# Patient Record
Sex: Male | Born: 1950
Health system: Southern US, Community
[De-identification: ages and names within clinical notes are randomized; demographics above are authoritative.]

## PROBLEM LIST (undated history)

## (undated) DIAGNOSIS — I499 Cardiac arrhythmia, unspecified: Secondary | ICD-10-CM

## (undated) DIAGNOSIS — K429 Umbilical hernia without obstruction or gangrene: Secondary | ICD-10-CM

## (undated) DIAGNOSIS — K219 Gastro-esophageal reflux disease without esophagitis: Secondary | ICD-10-CM

## (undated) DIAGNOSIS — J449 Chronic obstructive pulmonary disease, unspecified: Secondary | ICD-10-CM

## (undated) DIAGNOSIS — I10 Essential (primary) hypertension: Secondary | ICD-10-CM

## (undated) DIAGNOSIS — R42 Dizziness and giddiness: Secondary | ICD-10-CM

## (undated) DIAGNOSIS — M1990 Unspecified osteoarthritis, unspecified site: Secondary | ICD-10-CM

## (undated) DIAGNOSIS — T8859XA Other complications of anesthesia, initial encounter: Secondary | ICD-10-CM

## (undated) DIAGNOSIS — E7800 Pure hypercholesterolemia, unspecified: Secondary | ICD-10-CM

## (undated) HISTORY — PX: WISDOM TOOTH EXTRACTION: SHX21

## (undated) HISTORY — PX: SHOULDER SURGERY: SHX246

## (undated) HISTORY — DX: Pure hypercholesterolemia, unspecified: E78.00

## (undated) HISTORY — PX: TONSILLECTOMY AND ADENOIDECTOMY: SUR1326

## (undated) HISTORY — DX: Unspecified osteoarthritis, unspecified site: M19.90

---

## 1980-12-23 HISTORY — PX: HEMORRHOID SURGERY: SHX153

## 2003-12-24 DIAGNOSIS — G709 Myoneural disorder, unspecified: Secondary | ICD-10-CM

## 2003-12-24 HISTORY — DX: Myoneural disorder, unspecified: G70.9

## 2004-01-30 ENCOUNTER — Encounter: Payer: Self-pay | Admitting: Internal Medicine

## 2005-01-01 ENCOUNTER — Ambulatory Visit: Payer: Self-pay | Admitting: Internal Medicine

## 2005-01-10 ENCOUNTER — Ambulatory Visit: Payer: Self-pay | Admitting: Internal Medicine

## 2005-02-05 ENCOUNTER — Ambulatory Visit: Payer: Self-pay | Admitting: Internal Medicine

## 2005-05-06 ENCOUNTER — Ambulatory Visit: Payer: Self-pay | Admitting: Internal Medicine

## 2005-06-19 ENCOUNTER — Ambulatory Visit: Payer: Self-pay | Admitting: Internal Medicine

## 2005-06-26 ENCOUNTER — Encounter: Admission: RE | Admit: 2005-06-26 | Discharge: 2005-06-26 | Payer: Self-pay | Admitting: Orthopedic Surgery

## 2005-07-11 ENCOUNTER — Ambulatory Visit: Payer: Self-pay | Admitting: Internal Medicine

## 2005-08-06 ENCOUNTER — Ambulatory Visit: Payer: Self-pay | Admitting: Internal Medicine

## 2005-08-08 ENCOUNTER — Ambulatory Visit: Payer: Self-pay

## 2005-09-09 ENCOUNTER — Ambulatory Visit: Payer: Self-pay | Admitting: Cardiology

## 2005-09-10 ENCOUNTER — Ambulatory Visit: Payer: Self-pay

## 2005-09-10 ENCOUNTER — Encounter: Payer: Self-pay | Admitting: Internal Medicine

## 2005-10-03 ENCOUNTER — Inpatient Hospital Stay (HOSPITAL_COMMUNITY): Admission: RE | Admit: 2005-10-03 | Discharge: 2005-10-07 | Payer: Self-pay | Admitting: Orthopedic Surgery

## 2005-12-27 ENCOUNTER — Ambulatory Visit: Payer: Self-pay | Admitting: Internal Medicine

## 2006-01-03 ENCOUNTER — Ambulatory Visit: Payer: Self-pay | Admitting: Internal Medicine

## 2006-01-09 ENCOUNTER — Ambulatory Visit: Payer: Self-pay | Admitting: Gastroenterology

## 2006-01-29 ENCOUNTER — Ambulatory Visit: Payer: Self-pay | Admitting: Gastroenterology

## 2006-01-29 ENCOUNTER — Encounter (INDEPENDENT_AMBULATORY_CARE_PROVIDER_SITE_OTHER): Payer: Self-pay | Admitting: Specialist

## 2006-01-29 ENCOUNTER — Encounter: Payer: Self-pay | Admitting: Internal Medicine

## 2006-07-03 ENCOUNTER — Ambulatory Visit: Payer: Self-pay | Admitting: Internal Medicine

## 2006-07-10 ENCOUNTER — Ambulatory Visit: Payer: Self-pay | Admitting: Internal Medicine

## 2007-01-01 ENCOUNTER — Ambulatory Visit: Payer: Self-pay | Admitting: Internal Medicine

## 2007-01-01 LAB — CONVERTED CEMR LAB
ALT: 51 units/L — ABNORMAL HIGH (ref 0–40)
AST: 45 units/L — ABNORMAL HIGH (ref 0–37)
Albumin: 4 g/dL (ref 3.5–5.2)
Alkaline Phosphatase: 62 units/L (ref 39–117)
BUN: 14 mg/dL (ref 6–23)
Basophils Absolute: 0 10*3/uL (ref 0.0–0.1)
Basophils Relative: 0.1 % (ref 0.0–1.0)
CO2: 29 meq/L (ref 19–32)
Calcium: 9.8 mg/dL (ref 8.4–10.5)
Chloride: 105 meq/L (ref 96–112)
Chol/HDL Ratio, serum: 4.1
Cholesterol: 189 mg/dL (ref 0–200)
Creatinine, Ser: 1.3 mg/dL (ref 0.4–1.5)
Eosinophil percent: 4.9 % (ref 0.0–5.0)
GFR calc non Af Amer: 61 mL/min
Glomerular Filtration Rate, Af Am: 73 mL/min/{1.73_m2}
Glucose, Bld: 88 mg/dL (ref 70–99)
HCT: 44.4 % (ref 39.0–52.0)
HDL: 45.9 mg/dL (ref >39.0)
Hemoglobin: 15 g/dL (ref 13.0–17.0)
LDL DIRECT: 101.8 mg/dL
Lymphocytes Relative: 41.3 % (ref 12.0–46.0)
MCHC: 33.7 g/dL (ref 30.0–36.0)
MCV: 93.6 fL (ref 78.0–100.0)
Monocytes Absolute: 0.6 10*3/uL (ref 0.2–0.7)
Monocytes Relative: 10.8 % (ref 3.0–11.0)
Neutro Abs: 2.2 10*3/uL (ref 1.4–7.7)
Neutrophils Relative %: 42.9 % — ABNORMAL LOW (ref 43.0–77.0)
PSA: 2.04 ng/mL (ref 0.10–4.00)
Platelets: 259 10*3/uL (ref 150–400)
Potassium: 4.8 meq/L (ref 3.5–5.1)
RBC: 4.74 M/uL (ref 4.22–5.81)
RDW: 12.4 % (ref 11.5–14.6)
Sodium: 142 meq/L (ref 135–145)
TSH: 1.72 microintl units/mL (ref 0.35–5.50)
Testosterone, total: 2.5896 ng/mL — ABNORMAL LOW
Total Bilirubin: 0.7 mg/dL (ref 0.3–1.2)
Total Protein: 6.9 g/dL (ref 6.0–8.3)
Triglyceride fasting, serum: 244 mg/dL (ref 0–149)
VLDL: 49 mg/dL — ABNORMAL HIGH (ref 0–40)
WBC: 5.3 10*3/uL (ref 4.5–10.5)

## 2007-01-08 ENCOUNTER — Ambulatory Visit: Payer: Self-pay | Admitting: Internal Medicine

## 2007-02-13 ENCOUNTER — Ambulatory Visit: Payer: Self-pay | Admitting: Internal Medicine

## 2007-02-13 LAB — CONVERTED CEMR LAB
ALT: 38 units/L (ref 0–40)
AST: 34 units/L (ref 0–37)
Albumin: 4.1 g/dL (ref 3.5–5.2)
Alkaline Phosphatase: 58 units/L (ref 39–117)
Bilirubin, Direct: 0.1 mg/dL (ref 0.0–0.3)
Cholesterol: 199 mg/dL (ref 0–200)
HDL: 38.9 mg/dL — ABNORMAL LOW (ref >39.0)
LDL Cholesterol: 122 mg/dL — ABNORMAL HIGH (ref 0–99)
Testosterone: 594.73 ng/dL (ref 350.00–890)
Total Bilirubin: 1 mg/dL (ref 0.3–1.2)
Total CHOL/HDL Ratio: 5.1
Total Protein: 6.5 g/dL (ref 6.0–8.3)
Triglycerides: 193 mg/dL — ABNORMAL HIGH (ref 0–149)
VLDL: 39 mg/dL (ref 0–40)

## 2007-03-24 ENCOUNTER — Ambulatory Visit: Payer: Self-pay | Admitting: Internal Medicine

## 2007-03-24 LAB — CONVERTED CEMR LAB: H Pylori IgG: NEGATIVE

## 2007-05-07 ENCOUNTER — Ambulatory Visit: Payer: Self-pay | Admitting: Internal Medicine

## 2007-05-07 LAB — CONVERTED CEMR LAB
ALT: 68 units/L — ABNORMAL HIGH (ref 0–40)
AST: 51 units/L — ABNORMAL HIGH (ref 0–37)
Albumin: 4.2 g/dL (ref 3.5–5.2)
Alkaline Phosphatase: 57 units/L (ref 39–117)
Bilirubin, Direct: 0.1 mg/dL (ref 0.0–0.3)
Sex Hormone Binding: 27 nmol/L (ref 13–71)
Testosterone Free: 72.3 pg/mL (ref 47.0–244.0)
Testosterone-% Free: 2.2 % (ref 1.6–2.9)
Testosterone: 325.57 ng/dL — ABNORMAL LOW (ref 350–890)
Total Bilirubin: 0.6 mg/dL (ref 0.3–1.2)
Total Protein: 6.6 g/dL (ref 6.0–8.3)
Triglycerides: 136 mg/dL (ref 0–149)

## 2007-05-26 ENCOUNTER — Ambulatory Visit: Payer: Self-pay | Admitting: Internal Medicine

## 2007-07-28 ENCOUNTER — Telehealth: Payer: Self-pay | Admitting: *Deleted

## 2007-08-03 DIAGNOSIS — E785 Hyperlipidemia, unspecified: Secondary | ICD-10-CM

## 2007-08-11 ENCOUNTER — Telehealth: Payer: Self-pay | Admitting: Internal Medicine

## 2007-08-11 ENCOUNTER — Ambulatory Visit: Payer: Self-pay | Admitting: Internal Medicine

## 2007-08-11 LAB — CONVERTED CEMR LAB
Cholesterol: 198 mg/dL (ref 0–200)
Direct LDL: 119.7 mg/dL
HDL: 36.8 mg/dL — ABNORMAL LOW (ref >39.0)
Total CHOL/HDL Ratio: 5.4
Triglycerides: 218 mg/dL (ref 0–149)
VLDL: 44 mg/dL — ABNORMAL HIGH (ref 0–40)

## 2007-08-25 ENCOUNTER — Ambulatory Visit: Payer: Self-pay | Admitting: Internal Medicine

## 2007-08-25 DIAGNOSIS — M1990 Unspecified osteoarthritis, unspecified site: Secondary | ICD-10-CM | POA: Insufficient documentation

## 2007-08-25 DIAGNOSIS — R002 Palpitations: Secondary | ICD-10-CM | POA: Insufficient documentation

## 2007-11-23 ENCOUNTER — Telehealth: Payer: Self-pay | Admitting: Internal Medicine

## 2007-12-15 ENCOUNTER — Telehealth: Payer: Self-pay | Admitting: Internal Medicine

## 2008-02-02 ENCOUNTER — Ambulatory Visit: Payer: Self-pay | Admitting: Internal Medicine

## 2008-02-02 LAB — CONVERTED CEMR LAB
ALT: 42 units/L (ref 0–53)
AST: 29 units/L (ref 0–37)
Albumin: 3.7 g/dL (ref 3.5–5.2)
Alkaline Phosphatase: 52 units/L (ref 39–117)
BUN: 21 mg/dL (ref 6–23)
Basophils Absolute: 0 10*3/uL (ref 0.0–0.1)
Basophils Relative: 0.4 % (ref 0.0–1.0)
Bilirubin Urine: NEGATIVE
Bilirubin, Direct: 0.2 mg/dL (ref 0.0–0.3)
Blood in Urine, dipstick: NEGATIVE
CO2: 29 meq/L (ref 19–32)
Calcium: 9.4 mg/dL (ref 8.4–10.5)
Chloride: 106 meq/L (ref 96–112)
Cholesterol: 261 mg/dL (ref 0–200)
Creatinine, Ser: 1.5 mg/dL (ref 0.4–1.5)
Direct LDL: 152.5 mg/dL
Eosinophils Absolute: 0.2 10*3/uL (ref 0.0–0.6)
Eosinophils Relative: 3.5 % (ref 0.0–5.0)
GFR calc Af Amer: 62 mL/min
GFR calc non Af Amer: 51 mL/min
Glucose, Bld: 99 mg/dL (ref 70–99)
Glucose, Urine, Semiquant: NEGATIVE
HCT: 43.9 % (ref 39.0–52.0)
HDL: 35.3 mg/dL — ABNORMAL LOW (ref >39.0)
Hemoglobin: 14.9 g/dL (ref 13.0–17.0)
Ketones, urine, test strip: NEGATIVE
Lymphocytes Relative: 38.2 % (ref 12.0–46.0)
MCHC: 33.8 g/dL (ref 30.0–36.0)
MCV: 95.6 fL (ref 78.0–100.0)
Monocytes Absolute: 0.6 10*3/uL (ref 0.2–0.7)
Monocytes Relative: 9.2 % (ref 3.0–11.0)
Neutro Abs: 3.3 10*3/uL (ref 1.4–7.7)
Neutrophils Relative %: 48.7 % (ref 43.0–77.0)
Nitrite: NEGATIVE
PSA: 1.73 ng/mL (ref 0.10–4.00)
Platelets: 255 10*3/uL (ref 150–400)
Potassium: 5.3 meq/L — ABNORMAL HIGH (ref 3.5–5.1)
Protein, U semiquant: NEGATIVE
RBC: 4.59 M/uL (ref 4.22–5.81)
RDW: 12.1 % (ref 11.5–14.6)
Sodium: 140 meq/L (ref 135–145)
Specific Gravity, Urine: 1.02
TSH: 1.92 microintl units/mL (ref 0.35–5.50)
Total Bilirubin: 0.6 mg/dL (ref 0.3–1.2)
Total CHOL/HDL Ratio: 7.4
Total Protein: 6.2 g/dL (ref 6.0–8.3)
Triglycerides: 338 mg/dL (ref 0–149)
Urobilinogen, UA: 0.2
VLDL: 68 mg/dL — ABNORMAL HIGH (ref 0–40)
WBC Urine, dipstick: NEGATIVE
WBC: 6.6 10*3/uL (ref 4.5–10.5)
pH: 6

## 2008-02-09 ENCOUNTER — Ambulatory Visit: Payer: Self-pay | Admitting: Internal Medicine

## 2008-02-12 ENCOUNTER — Encounter: Payer: Self-pay | Admitting: Internal Medicine

## 2008-03-14 ENCOUNTER — Ambulatory Visit: Payer: Self-pay

## 2008-03-15 ENCOUNTER — Ambulatory Visit: Payer: Self-pay | Admitting: Internal Medicine

## 2008-03-15 LAB — CONVERTED CEMR LAB
ALT: 68 units/L — ABNORMAL HIGH (ref 0–53)
AST: 46 units/L — ABNORMAL HIGH (ref 0–37)
Albumin: 4.1 g/dL (ref 3.5–5.2)
Alkaline Phosphatase: 68 units/L (ref 39–117)
Bilirubin, Direct: 0.2 mg/dL (ref 0.0–0.3)
Cholesterol: 179 mg/dL (ref 0–200)
HDL: 33.6 mg/dL — ABNORMAL LOW (ref >39.0)
LDL Cholesterol: 118 mg/dL — ABNORMAL HIGH (ref 0–99)
Total Bilirubin: 0.7 mg/dL (ref 0.3–1.2)
Total CHOL/HDL Ratio: 5.3
Total Protein: 6.9 g/dL (ref 6.0–8.3)
Triglycerides: 138 mg/dL (ref 0–149)
VLDL: 28 mg/dL (ref 0–40)

## 2008-03-22 ENCOUNTER — Ambulatory Visit: Payer: Self-pay | Admitting: Internal Medicine

## 2008-03-22 DIAGNOSIS — B351 Tinea unguium: Secondary | ICD-10-CM

## 2008-03-22 DIAGNOSIS — E538 Deficiency of other specified B group vitamins: Secondary | ICD-10-CM | POA: Insufficient documentation

## 2008-03-22 DIAGNOSIS — S6000XA Contusion of unspecified finger without damage to nail, initial encounter: Secondary | ICD-10-CM

## 2008-04-26 ENCOUNTER — Ambulatory Visit: Payer: Self-pay | Admitting: Internal Medicine

## 2008-04-26 DIAGNOSIS — T887XXA Unspecified adverse effect of drug or medicament, initial encounter: Secondary | ICD-10-CM | POA: Insufficient documentation

## 2008-04-26 LAB — CONVERTED CEMR LAB
ALT: 44 units/L (ref 0–53)
AST: 40 units/L — ABNORMAL HIGH (ref 0–37)
Albumin: 4.1 g/dL (ref 3.5–5.2)
Alkaline Phosphatase: 63 units/L (ref 39–117)
Bilirubin, Direct: 0.1 mg/dL (ref 0.0–0.3)
Total Bilirubin: 1.1 mg/dL (ref 0.3–1.2)
Total Protein: 6.8 g/dL (ref 6.0–8.3)
Vitamin B-12: 1016 pg/mL — ABNORMAL HIGH (ref 211–911)

## 2008-05-03 ENCOUNTER — Ambulatory Visit: Payer: Self-pay | Admitting: Internal Medicine

## 2008-05-03 LAB — CONVERTED CEMR LAB
Cholesterol, target level: 200 mg/dL
HDL goal, serum: 40 mg/dL
LDL Goal: 100 mg/dL

## 2008-07-26 ENCOUNTER — Ambulatory Visit: Payer: Self-pay | Admitting: Internal Medicine

## 2008-07-26 DIAGNOSIS — R5383 Other fatigue: Secondary | ICD-10-CM

## 2008-07-26 DIAGNOSIS — F341 Dysthymic disorder: Secondary | ICD-10-CM | POA: Insufficient documentation

## 2008-07-26 DIAGNOSIS — R5381 Other malaise: Secondary | ICD-10-CM

## 2008-07-26 LAB — CONVERTED CEMR LAB
ALT: 35 units/L (ref 0–53)
AST: 31 units/L (ref 0–37)
Albumin: 4.1 g/dL (ref 3.5–5.2)
Alkaline Phosphatase: 67 units/L (ref 39–117)
Basophils Absolute: 0 10*3/uL (ref 0.0–0.1)
Basophils Relative: 0.6 % (ref 0.0–3.0)
Bilirubin, Direct: 0.1 mg/dL (ref 0.0–0.3)
Eosinophils Absolute: 0.2 10*3/uL (ref 0.0–0.7)
Eosinophils Relative: 4.5 % (ref 0.0–5.0)
HCT: 42.6 % (ref 39.0–52.0)
Hemoglobin: 14.9 g/dL (ref 13.0–17.0)
Lymphocytes Relative: 39.5 % (ref 12.0–46.0)
MCHC: 34.9 g/dL (ref 30.0–36.0)
MCV: 93.6 fL (ref 78.0–100.0)
Monocytes Absolute: 0.6 10*3/uL (ref 0.1–1.0)
Monocytes Relative: 11.3 % (ref 3.0–12.0)
Neutro Abs: 2.3 10*3/uL (ref 1.4–7.7)
Neutrophils Relative %: 44.1 % (ref 43.0–77.0)
Platelets: 246 10*3/uL (ref 150–400)
RBC: 4.55 M/uL (ref 4.22–5.81)
RDW: 12.3 % (ref 11.5–14.6)
TSH: 1.24 microintl units/mL (ref 0.35–5.50)
Total Bilirubin: 0.8 mg/dL (ref 0.3–1.2)
Total Protein: 6.9 g/dL (ref 6.0–8.3)
WBC: 5.1 10*3/uL (ref 4.5–10.5)

## 2008-07-28 ENCOUNTER — Encounter: Payer: Self-pay | Admitting: Internal Medicine

## 2008-08-01 LAB — CONVERTED CEMR LAB: Vit D, 1,25-Dihydroxy: 31

## 2008-08-08 ENCOUNTER — Telehealth: Payer: Self-pay | Admitting: Internal Medicine

## 2008-08-23 ENCOUNTER — Ambulatory Visit: Payer: Self-pay | Admitting: Internal Medicine

## 2008-09-20 ENCOUNTER — Telehealth: Payer: Self-pay | Admitting: Internal Medicine

## 2008-11-01 ENCOUNTER — Ambulatory Visit: Payer: Self-pay | Admitting: Internal Medicine

## 2008-11-01 DIAGNOSIS — M818 Other osteoporosis without current pathological fracture: Secondary | ICD-10-CM | POA: Insufficient documentation

## 2009-01-11 ENCOUNTER — Telehealth: Payer: Self-pay | Admitting: Internal Medicine

## 2009-01-31 ENCOUNTER — Telehealth: Payer: Self-pay | Admitting: Internal Medicine

## 2009-02-27 ENCOUNTER — Telehealth: Payer: Self-pay | Admitting: Internal Medicine

## 2009-03-07 ENCOUNTER — Ambulatory Visit: Payer: Self-pay | Admitting: Internal Medicine

## 2009-03-07 LAB — CONVERTED CEMR LAB
ALT: 56 units/L — ABNORMAL HIGH (ref 0–53)
AST: 43 units/L — ABNORMAL HIGH (ref 0–37)
Albumin: 4.1 g/dL (ref 3.5–5.2)
Alkaline Phosphatase: 63 units/L (ref 39–117)
BUN: 15 mg/dL (ref 6–23)
Basophils Absolute: 0 10*3/uL (ref 0.0–0.1)
Basophils Relative: 0.3 % (ref 0.0–3.0)
Bilirubin Urine: NEGATIVE
Bilirubin, Direct: 0 mg/dL (ref 0.0–0.3)
Blood in Urine, dipstick: NEGATIVE
CO2: 29 meq/L (ref 19–32)
Calcium: 9.4 mg/dL (ref 8.4–10.5)
Chloride: 110 meq/L (ref 96–112)
Cholesterol: 149 mg/dL (ref 0–200)
Creatinine, Ser: 1.2 mg/dL (ref 0.4–1.5)
Eosinophils Absolute: 0.2 10*3/uL (ref 0.0–0.7)
Eosinophils Relative: 4.3 % (ref 0.0–5.0)
GFR calc non Af Amer: 66.05 mL/min (ref >60)
Glucose, Bld: 110 mg/dL — ABNORMAL HIGH (ref 70–99)
Glucose, Urine, Semiquant: NEGATIVE
HCT: 45.7 % (ref 39.0–52.0)
HDL: 32.1 mg/dL — ABNORMAL LOW (ref >39.00)
Hemoglobin: 15.8 g/dL (ref 13.0–17.0)
Ketones, urine, test strip: NEGATIVE
LDL Cholesterol: 96 mg/dL (ref 0–99)
Lymphocytes Relative: 41.7 % (ref 12.0–46.0)
Lymphs Abs: 2.3 10*3/uL (ref 0.7–4.0)
MCHC: 34.6 g/dL (ref 30.0–36.0)
MCV: 93.1 fL (ref 78.0–100.0)
Monocytes Absolute: 0.4 10*3/uL (ref 0.1–1.0)
Monocytes Relative: 7.7 % (ref 3.0–12.0)
Neutro Abs: 2.5 10*3/uL (ref 1.4–7.7)
Neutrophils Relative %: 46 % (ref 43.0–77.0)
Nitrite: NEGATIVE
PSA: 2.31 ng/mL (ref 0.10–4.00)
Platelets: 209 10*3/uL (ref 150.0–400.0)
Potassium: 4.6 meq/L (ref 3.5–5.1)
RBC: 4.91 M/uL (ref 4.22–5.81)
RDW: 12.1 % (ref 11.5–14.6)
Sodium: 145 meq/L (ref 135–145)
Specific Gravity, Urine: 1.02
TSH: 1.14 microintl units/mL (ref 0.35–5.50)
Total Bilirubin: 1 mg/dL (ref 0.3–1.2)
Total CHOL/HDL Ratio: 5
Total Protein: 6.8 g/dL (ref 6.0–8.3)
Triglycerides: 105 mg/dL (ref 0.0–149.0)
Urobilinogen, UA: 0.2
VLDL: 21 mg/dL (ref 0.0–40.0)
WBC Urine, dipstick: NEGATIVE
WBC: 5.4 10*3/uL (ref 4.5–10.5)
pH: 5.5

## 2009-03-14 ENCOUNTER — Ambulatory Visit: Payer: Self-pay | Admitting: Internal Medicine

## 2009-05-10 ENCOUNTER — Telehealth (INDEPENDENT_AMBULATORY_CARE_PROVIDER_SITE_OTHER): Payer: Self-pay | Admitting: *Deleted

## 2009-09-06 ENCOUNTER — Ambulatory Visit: Payer: Self-pay | Admitting: Internal Medicine

## 2009-09-06 LAB — CONVERTED CEMR LAB
ALT: 46 units/L (ref 0–53)
AST: 40 units/L — ABNORMAL HIGH (ref 0–37)
Albumin: 3.9 g/dL (ref 3.5–5.2)
Alkaline Phosphatase: 57 units/L (ref 39–117)
Bilirubin, Direct: 0 mg/dL (ref 0.0–0.3)
Cholesterol: 173 mg/dL (ref 0–200)
HDL: 35.6 mg/dL — ABNORMAL LOW (ref >39.00)
LDL Cholesterol: 110 mg/dL — ABNORMAL HIGH (ref 0–99)
Total Bilirubin: 0.6 mg/dL (ref 0.3–1.2)
Total CHOL/HDL Ratio: 5
Total Protein: 6.8 g/dL (ref 6.0–8.3)
Triglycerides: 135 mg/dL (ref 0.0–149.0)
VLDL: 27 mg/dL (ref 0.0–40.0)

## 2009-09-08 ENCOUNTER — Ambulatory Visit: Payer: Self-pay | Admitting: Internal Medicine

## 2009-09-08 ENCOUNTER — Telehealth: Payer: Self-pay | Admitting: Internal Medicine

## 2009-10-12 ENCOUNTER — Ambulatory Visit: Payer: Self-pay | Admitting: Internal Medicine

## 2010-02-22 ENCOUNTER — Ambulatory Visit: Payer: Self-pay | Admitting: Family Medicine

## 2010-02-22 LAB — CONVERTED CEMR LAB
Bilirubin Urine: NEGATIVE
Blood in Urine, dipstick: NEGATIVE
Glucose, Urine, Semiquant: NEGATIVE
Ketones, urine, test strip: NEGATIVE
Nitrite: NEGATIVE
Protein, U semiquant: NEGATIVE
Specific Gravity, Urine: 1.01
Urobilinogen, UA: 0.2
WBC Urine, dipstick: NEGATIVE
pH: 6

## 2010-03-06 ENCOUNTER — Ambulatory Visit: Payer: Self-pay | Admitting: Internal Medicine

## 2010-03-06 LAB — CONVERTED CEMR LAB
ALT: 53 units/L (ref 0–53)
AST: 45 units/L — ABNORMAL HIGH (ref 0–37)
Albumin: 3.9 g/dL (ref 3.5–5.2)
Alkaline Phosphatase: 58 units/L (ref 39–117)
BUN: 18 mg/dL (ref 6–23)
Basophils Absolute: 0 10*3/uL (ref 0.0–0.1)
Basophils Relative: 0 % (ref 0.0–3.0)
Bilirubin Urine: NEGATIVE
Bilirubin, Direct: 0 mg/dL (ref 0.0–0.3)
Blood in Urine, dipstick: NEGATIVE
CO2: 31 meq/L (ref 19–32)
Calcium: 9.4 mg/dL (ref 8.4–10.5)
Chloride: 112 meq/L (ref 96–112)
Cholesterol: 157 mg/dL (ref 0–200)
Creatinine, Ser: 1.2 mg/dL (ref 0.4–1.5)
Eosinophils Absolute: 0.4 10*3/uL (ref 0.0–0.7)
Eosinophils Relative: 5.8 % — ABNORMAL HIGH (ref 0.0–5.0)
GFR calc non Af Amer: 65.82 mL/min (ref >60)
Glucose, Bld: 90 mg/dL (ref 70–99)
Glucose, Urine, Semiquant: NEGATIVE
HCT: 42.6 % (ref 39.0–52.0)
HDL: 46.6 mg/dL (ref >39.00)
Hemoglobin: 14.1 g/dL (ref 13.0–17.0)
Ketones, urine, test strip: NEGATIVE
LDL Cholesterol: 76 mg/dL (ref 0–99)
Lymphocytes Relative: 43.6 % (ref 12.0–46.0)
Lymphs Abs: 2.7 10*3/uL (ref 0.7–4.0)
MCHC: 33.2 g/dL (ref 30.0–36.0)
MCV: 94.5 fL (ref 78.0–100.0)
Monocytes Absolute: 0.5 10*3/uL (ref 0.1–1.0)
Monocytes Relative: 8.7 % (ref 3.0–12.0)
Neutro Abs: 2.6 10*3/uL (ref 1.4–7.7)
Neutrophils Relative %: 41.9 % — ABNORMAL LOW (ref 43.0–77.0)
Nitrite: NEGATIVE
PSA: 3.71 ng/mL (ref 0.10–4.00)
Platelets: 271 10*3/uL (ref 150.0–400.0)
Potassium: 5.4 meq/L — ABNORMAL HIGH (ref 3.5–5.1)
Protein, U semiquant: NEGATIVE
RBC: 4.5 M/uL (ref 4.22–5.81)
RDW: 11.8 % (ref 11.5–14.6)
Sodium: 146 meq/L — ABNORMAL HIGH (ref 135–145)
Specific Gravity, Urine: 1.025
TSH: 1.74 microintl units/mL (ref 0.35–5.50)
Testosterone: 324.6 ng/dL — ABNORMAL LOW (ref 350.00–890.00)
Total Bilirubin: 0.5 mg/dL (ref 0.3–1.2)
Total CHOL/HDL Ratio: 3
Total Protein: 6.7 g/dL (ref 6.0–8.3)
Triglycerides: 171 mg/dL — ABNORMAL HIGH (ref 0.0–149.0)
Urobilinogen, UA: 0.2
VLDL: 34.2 mg/dL (ref 0.0–40.0)
WBC Urine, dipstick: NEGATIVE
WBC: 6.2 10*3/uL (ref 4.5–10.5)
pH: 5.5

## 2010-03-13 ENCOUNTER — Ambulatory Visit: Payer: Self-pay | Admitting: Internal Medicine

## 2010-07-03 ENCOUNTER — Telehealth: Payer: Self-pay | Admitting: Internal Medicine

## 2010-07-25 DIAGNOSIS — E291 Testicular hypofunction: Secondary | ICD-10-CM

## 2010-07-26 ENCOUNTER — Encounter: Payer: Self-pay | Admitting: Internal Medicine

## 2010-08-01 ENCOUNTER — Telehealth: Payer: Self-pay | Admitting: Internal Medicine

## 2010-08-03 ENCOUNTER — Telehealth: Payer: Self-pay | Admitting: Internal Medicine

## 2010-08-28 ENCOUNTER — Encounter: Payer: Self-pay | Admitting: Internal Medicine

## 2010-09-03 ENCOUNTER — Ambulatory Visit: Payer: Self-pay | Admitting: Internal Medicine

## 2010-09-13 ENCOUNTER — Ambulatory Visit: Payer: Self-pay | Admitting: Internal Medicine

## 2010-09-13 LAB — CONVERTED CEMR LAB
ALT: 51 units/L (ref 0–53)
AST: 46 units/L — ABNORMAL HIGH (ref 0–37)
Albumin: 4.1 g/dL (ref 3.5–5.2)
Alkaline Phosphatase: 53 units/L (ref 39–117)
Bilirubin, Direct: 0.1 mg/dL (ref 0.0–0.3)
Cholesterol: 165 mg/dL (ref 0–200)
HDL: 38.9 mg/dL — ABNORMAL LOW (ref >39.00)
LDL Cholesterol: 93 mg/dL (ref 0–99)
Total Bilirubin: 0.7 mg/dL (ref 0.3–1.2)
Total CHOL/HDL Ratio: 4
Total Protein: 6.3 g/dL (ref 6.0–8.3)
Triglycerides: 168 mg/dL — ABNORMAL HIGH (ref 0.0–149.0)
VLDL: 33.6 mg/dL (ref 0.0–40.0)
Vit D, 25-Hydroxy: 55 ng/mL (ref 30–89)

## 2010-09-20 ENCOUNTER — Ambulatory Visit: Payer: Self-pay | Admitting: Internal Medicine

## 2011-01-08 ENCOUNTER — Ambulatory Visit
Admission: RE | Admit: 2011-01-08 | Discharge: 2011-01-08 | Payer: Self-pay | Source: Home / Self Care | Attending: Internal Medicine | Admitting: Internal Medicine

## 2011-01-08 ENCOUNTER — Other Ambulatory Visit: Payer: Self-pay | Admitting: Internal Medicine

## 2011-01-08 LAB — CONVERTED CEMR LAB
Bilirubin Urine: NEGATIVE
Blood in Urine, dipstick: NEGATIVE
Glucose, Urine, Semiquant: NEGATIVE
Ketones, urine, test strip: NEGATIVE
Nitrite: NEGATIVE
Protein, U semiquant: NEGATIVE
Specific Gravity, Urine: 1.02
Urobilinogen, UA: 0.2
WBC Urine, dipstick: NEGATIVE
pH: 5.5

## 2011-01-08 LAB — BASIC METABOLIC PANEL
BUN: 21 mg/dL (ref 6–23)
CO2: 28 mEq/L (ref 19–32)
Calcium: 9.1 mg/dL (ref 8.4–10.5)
Chloride: 108 mEq/L (ref 96–112)
Creatinine, Ser: 1.3 mg/dL (ref 0.4–1.5)
GFR: 59.32 mL/min — ABNORMAL LOW (ref >60.00)
Glucose, Bld: 102 mg/dL — ABNORMAL HIGH (ref 70–99)
Potassium: 4.8 mEq/L (ref 3.5–5.1)
Sodium: 142 mEq/L (ref 135–145)

## 2011-01-08 LAB — CBC WITH DIFFERENTIAL/PLATELET
Basophils Absolute: 0 10*3/uL (ref 0.0–0.1)
Basophils Relative: 0.4 % (ref 0.0–3.0)
Eosinophils Absolute: 0.3 10*3/uL (ref 0.0–0.7)
Eosinophils Relative: 6.2 % — ABNORMAL HIGH (ref 0.0–5.0)
HCT: 41.8 % (ref 39.0–52.0)
Hemoglobin: 14.3 g/dL (ref 13.0–17.0)
Lymphocytes Relative: 38.5 % (ref 12.0–46.0)
Lymphs Abs: 2 10*3/uL (ref 0.7–4.0)
MCHC: 34.3 g/dL (ref 30.0–36.0)
MCV: 94.4 fl (ref 78.0–100.0)
Monocytes Absolute: 0.5 10*3/uL (ref 0.1–1.0)
Monocytes Relative: 8.6 % (ref 3.0–12.0)
Neutro Abs: 2.4 10*3/uL (ref 1.4–7.7)
Neutrophils Relative %: 46.3 % (ref 43.0–77.0)
Platelets: 192 10*3/uL (ref 150.0–400.0)
RBC: 4.42 Mil/uL (ref 4.22–5.81)
RDW: 12.8 % (ref 11.5–14.6)
WBC: 5.3 10*3/uL (ref 4.5–10.5)

## 2011-01-08 LAB — LIPID PANEL
Cholesterol: 187 mg/dL (ref 0–200)
HDL: 39.1 mg/dL (ref >39.00)
LDL Cholesterol: 114 mg/dL — ABNORMAL HIGH (ref 0–99)
Total CHOL/HDL Ratio: 5
Triglycerides: 169 mg/dL — ABNORMAL HIGH (ref 0.0–149.0)
VLDL: 33.8 mg/dL (ref 0.0–40.0)

## 2011-01-08 LAB — HEPATIC FUNCTION PANEL
ALT: 47 U/L (ref 0–53)
AST: 41 U/L — ABNORMAL HIGH (ref 0–37)
Albumin: 4 g/dL (ref 3.5–5.2)
Alkaline Phosphatase: 52 U/L (ref 39–117)
Bilirubin, Direct: 0.1 mg/dL (ref 0.0–0.3)
Total Bilirubin: 0.5 mg/dL (ref 0.3–1.2)
Total Protein: 6.4 g/dL (ref 6.0–8.3)

## 2011-01-08 LAB — TSH: TSH: 1.79 u[IU]/mL (ref 0.35–5.50)

## 2011-01-08 LAB — PSA: PSA: 3.25 ng/mL (ref 0.10–4.00)

## 2011-01-08 LAB — TESTOSTERONE: Testosterone: 367.01 ng/dL (ref 350.00–890.00)

## 2011-01-12 ENCOUNTER — Encounter: Payer: Self-pay | Admitting: General Practice

## 2011-01-16 ENCOUNTER — Other Ambulatory Visit: Payer: Self-pay | Admitting: Internal Medicine

## 2011-01-16 ENCOUNTER — Ambulatory Visit
Admission: RE | Admit: 2011-01-16 | Discharge: 2011-01-16 | Payer: Self-pay | Source: Home / Self Care | Attending: Internal Medicine | Admitting: Internal Medicine

## 2011-01-16 ENCOUNTER — Encounter: Payer: Self-pay | Admitting: Internal Medicine

## 2011-01-16 DIAGNOSIS — K429 Umbilical hernia without obstruction or gangrene: Secondary | ICD-10-CM | POA: Insufficient documentation

## 2011-01-16 DIAGNOSIS — E559 Vitamin D deficiency, unspecified: Secondary | ICD-10-CM | POA: Insufficient documentation

## 2011-01-16 DIAGNOSIS — M720 Palmar fascial fibromatosis [Dupuytren]: Secondary | ICD-10-CM | POA: Insufficient documentation

## 2011-01-16 LAB — B12 AND FOLATE PANEL
Folate: 8.9 ng/mL (ref >5.9)
Vitamin B-12: 438 pg/mL (ref 211–911)

## 2011-01-16 LAB — CONVERTED CEMR LAB: Vit D, 25-Hydroxy: 40 ng/mL (ref 30–89)

## 2011-01-22 NOTE — Progress Notes (Signed)
Summary: trauma to face  Phone Note Call from Patient   Caller: Patient Call For: Stacie Glaze MD Summary of Call: Pt fell 2 weeks ago with trauma to face and took Doxycycline from Urgent Care for 10 days, and finished 5 days ago.  Wound is now itching and burning again, and wants to refill Doxycycline [facility]).  [place] beach right [place]. [phone] Initial call taken by: Lynann Beaver [name],  July 03, 2010 1:04 PM  Follow-up for Phone Call        may have  7 days of doxycycline two times a day - but be careful in the sun-  Follow-up by: Willy Eddy, LPN,  July 03, 2010 3:24 PM    New/Updated Medications: DOXYCYCLINE HYCLATE 100 MG TABS (DOXYCYCLINE HYCLATE) one by mouth two times a day x 7 days Prescriptions: DOXYCYCLINE HYCLATE 100 MG TABS (DOXYCYCLINE HYCLATE) one by mouth two times a day x 7 days  #14 x 0   Entered by:   Lynann Beaver [name]   Authorized by:   Stacie Glaze MD   Signed by:   Lynann Beaver [name] on 07/03/2010   Method used:   Electronically to        [facility]. [phone]* (retail)       [address]       [place], [place]  [address]       Ph: [phone]       Fax: [phone]   RxID:   [id number]  Left message on pt's personal voice mail and warned about sun.

## 2011-01-22 NOTE — Progress Notes (Signed)
  Phone Note Call from Patient Call back at Work Phone [phone]   Caller: Patient Call For: Stacie Glaze MD Summary of Call: Pt leaves a message that he is having palpitations that "take his breath away", and indigestion.Marland KitchenMarland KitchenPer Dr. Lovell Sheehan......go directly to the ER at [facility].  Notified pt and he agrees. Initial call taken by: Lynann Beaver [name],  August 01, 2010 3:12 PM  Follow-up for Phone Call        talked with pt and he is ok this am and did nt go to er- dr Lovell Sheehan talked with pt Follow-up by: Willy Eddy, LPN,  August 02, 2010 11:03 AM

## 2011-01-22 NOTE — Assessment & Plan Note (Signed)
Summary: CPX/RCD   Vital Signs:  Patient profile:   60 year old male Height:      72 inches Weight:      206 pounds BMI:     28.04 Temp:     98.2 degrees F oral Pulse rate:   60 / minute Resp:     14 per minute BP sitting:   140 / 86  (left arm)  Vitals Entered By: Willy Eddy, LPN (March 13, 2010 10:50 AM) CC: cpx- pt stopped bisoprolol and buspirone - states it made him feel bad   CC:  cpx- pt stopped bisoprolol and buspirone - states it made him feel bad.  History of Present Illness: The pt was asked about all immunizations, health maint. services that are appropriate to their age and was given guidance on diet exercize  and weight management   Preventive Screening-Counseling & Management  Alcohol-Tobacco     Smoking Status: quit  Problems Prior to Update: 1)  Palpitations, Recurrent  (ICD-785.1) 2)  Idiopathic Osteoporosis  (ICD-733.02) 3)  Anxiety Depression  (ICD-300.4) 4)  Malaise and Fatigue  (ICD-780.79) 5)  Uns Advrs Eff Uns Rx Medicinal&biological Sbstnc  (ICD-995.20) 6)  Vitamin B12 Deficiency  (ICD-266.2) 7)  Dermatophytosis of Nail  (ICD-110.1) 8)  Contusion of Finger  (ICD-923.3) 9)  Preventive Health Care  (ICD-V70.0) 10)  Palpitations, Occasional  (ICD-785.1) 11)  Osteoarthritis  (ICD-715.90) 12)  Hyperlipidemia  (ICD-272.4)  Current Problems (verified): 1)  Palpitations, Recurrent  (ICD-785.1) 2)  Idiopathic Osteoporosis  (ICD-733.02) 3)  Anxiety Depression  (ICD-300.4) 4)  Malaise and Fatigue  (ICD-780.79) 5)  Uns Advrs Eff Uns Rx Medicinal&biological Sbstnc  (ICD-995.20) 6)  Vitamin B12 Deficiency  (ICD-266.2) 7)  Dermatophytosis of Nail  (ICD-110.1) 8)  Contusion of Finger  (ICD-923.3) 9)  Preventive Health Care  (ICD-V70.0) 10)  Palpitations, Occasional  (ICD-785.1) 11)  Osteoarthritis  (ICD-715.90) 12)  Hyperlipidemia  (ICD-272.4)  Medications Prior to Update: 1)  Androgel Pump 1 % Gel (Testosterone) .... 3 Pumps Every Day 2)   Ativan 1 Mg Tabs (Lorazepam) .... Once Daily As Needed 3)  Cialis 20 Mg Tabs (Tadalafil) .... As Needed 4)  Crestor 10 Mg Tabs (Rosuvastatin Calcium) .Marland Kitchen.. 1 Once Daily 5)  Valtrex 1 Gm  Tabs (Valacyclovir Hcl) .... 1/2 Every Day 6)  Mobic 15 Mg  Tabs (Meloxicam) .... One By Mouth Daily 7)  Protonix 40 Mg  Tbec (Pantoprazole Sodium) .... Once Daily 8)  Adult Aspirin Ec Low Strength 81 Mg  Tbec (Aspirin) .... Once Daily 9)  Buspirone Hcl 5 Mg Tabs (Buspirone Hcl) .... One By Mouth  Two Times A Day As Needed 10)  Vitamin D 16109 Unit Caps (Ergocalciferol) .... One By Mouth Weakly 11)  Bisoprolol Fumarate 5 Mg Tabs (Bisoprolol Fumarate) .... 1/2 By Mouth Daily  Current Medications (verified): 1)  Androgel Pump 1 % Gel (Testosterone) .... 3 Pumps Every Day 2)  Ativan 1 Mg Tabs (Lorazepam) .... Once Daily As Needed 3)  Cialis 20 Mg Tabs (Tadalafil) .... As Needed 4)  Crestor 10 Mg Tabs (Rosuvastatin Calcium) .Marland Kitchen.. 1 Once Daily 5)  Valtrex 1 Gm  Tabs (Valacyclovir Hcl) .... 1/2 Every Day 6)  Mobic 15 Mg  Tabs (Meloxicam) .... One By Mouth Daily 7)  Protonix 40 Mg  Tbec (Pantoprazole Sodium) .... Once Daily 8)  Adult Aspirin Ec Low Strength 81 Mg  Tbec (Aspirin) .... Once Daily 9)  Vitamin D 60454 Unit Caps (Ergocalciferol) .... One By Mouth  Weakly 10)  Coq-10 100 Mg Caps (Coenzyme Q10) .Marland Kitchen.. 1 Once Daily  Allergies (verified): 1)  ! Vytorin (Ezetimibe-Simvastatin)  Past History:  Social History: Last updated: 07/29/2007 Former Smoker Single Alcohol use-yes  Risk Factors: Smoking Status: quit (03/13/2010)  Past medical, surgical, family and social histories (including risk factors) reviewed, and no changes noted (except as noted below).  Past Medical History: Reviewed history from 08/25/2007 and no changes required. Hypercholesterolemia Osteoarthritis  Past Surgical History: Reviewed history from 08/25/2007 and no changes required. Colonoscopy-01/29/2006 Total hip  replacement  Family History: Reviewed history and no changes required.  Social History: Reviewed history from 07/29/2007 and no changes required. Former Smoker Single Alcohol use-yes  Review of Systems  The patient denies anorexia, fever, weight loss, weight gain, vision loss, decreased hearing, hoarseness, chest pain, syncope, dyspnea on exertion, peripheral edema, prolonged cough, headaches, hemoptysis, abdominal pain, melena, hematochezia, severe indigestion/heartburn, hematuria, incontinence, genital sores, muscle weakness, suspicious skin lesions, transient blindness, difficulty walking, depression, unusual weight change, abnormal bleeding, enlarged lymph nodes, angioedema, breast masses, and testicular masses.    Physical Exam  General:  Well-developed,well-nourished,in no acute distress; alert,appropriate and cooperative throughout examination Head:  Normocephalic and atraumatic without obvious abnormalities. No apparent alopecia or balding. Eyes:  pupils equal and pupils round.   Ears:  External ear exam shows no significant lesions or deformities.  Otoscopic examination reveals clear canals, tympanic membranes are intact bilaterally without bulging, retraction, inflammation or discharge. Hearing is grossly normal bilaterally. Mouth:  Oral mucosa and oropharynx without lesions or exudates.  Teeth in good repair. Neck:  No deformities, masses, or tenderness noted. Lungs:  Normal respiratory effort, chest expands symmetrically. Lungs are clear to auscultation, no crackles or wheezes. Heart:  Normal rate and regular rhythm. S1 and S2 normal without gallop, murmur, click, rub or other extra sounds. Abdomen:  soft, non-tender, normal bowel sounds, no distention, no masses, no guarding, no rigidity, no rebound tenderness, no abdominal hernia, no hepatomegaly, and no splenomegaly.   Msk:  normal ROM.  no back tenderness Pulses:  R and L carotid,radial,femoral,dorsalis pedis and posterior  tibial pulses are full and equal bilaterally Extremities:  no edema   Impression & Recommendations:  Problem # 1:  PREVENTIVE HEALTH CARE (ICD-V70.0)  Colonoscopy: repeat in 10 years (01/29/2006) Td Booster: Historical (12/23/2005)   Flu Vax: Historical (09/28/2009)   Chol: 157 (03/06/2010)   HDL: 46.60 (03/06/2010)   LDL: 76 (03/06/2010)   TG: 171.0 (03/06/2010) TSH: 1.74 (03/06/2010)   PSA: 3.71 (03/06/2010) Next Colonoscopy due:: 01/2016 (03/14/2009)  Discussed using sunscreen, use of alcohol, drug use, self testicular exam, routine dental care, routine eye care, routine physical exam, seat belts, multiple vitamins, osteoporosis prevention, adequate calcium intake in diet, and recommendations for immunizations.  Discussed exercise and checking cholesterol.  Discussed gun safety, safe sex, and contraception. Also recommend checking PSA.  Orders: T-2 View CXR (71020TC)  Problem # 2:  HYPERLIPIDEMIA (ICD-272.4)  His updated medication list for this problem includes:    Crestor 10 Mg Tabs (Rosuvastatin calcium) .Marland Kitchen... 1 once daily  Labs Reviewed: SGOT: 45 (03/06/2010)   SGPT: 53 (03/06/2010)  Lipid Goals: Chol Goal: 200 (05/03/2008)   HDL Goal: 40 (05/03/2008)   LDL Goal: 100 (05/03/2008)   TG Goal: 150 (05/03/2008)  Prior 10 Yr Risk Heart Disease: 22 % (05/03/2008)   HDL:46.60 (03/06/2010), 35.60 (09/06/2009)  LDL:76 (03/06/2010), 110 (04/54/0981)  Chol:157 (03/06/2010), 173 (09/06/2009)  Trig:171.0 (03/06/2010), 135.0 (09/06/2009)  Problem # 3:  OSTEOARTHRITIS (ICD-715.90)  His updated medication list for this problem includes:    Aspirin 325 Mg Tabs (Aspirin) .Marland Kitchen..Marland Kitchen Two by mouth daily    Adult Aspirin Ec Low Strength 81 Mg Tbec (Aspirin) ..... Once daily  Discussed use of medications, application of heat or cold, and exercises.   Complete Medication List: 1)  Androgel Pump 1 % Gel (Testosterone) .... 3 pumps every day 2)  Ativan 1 Mg Tabs (Lorazepam) .... Once daily as  needed 3)  Cialis 20 Mg Tabs (Tadalafil) .... As needed 4)  Crestor 10 Mg Tabs (Rosuvastatin calcium) .Marland Kitchen.. 1 once daily 5)  Valtrex 1 Gm Tabs (Valacyclovir hcl) .... 1/2 every day 6)  Aspirin 325 Mg Tabs (Aspirin) .... Two by mouth daily 7)  Protonix 40 Mg Tbec (Pantoprazole sodium) .... Once daily 8)  Adult Aspirin Ec Low Strength 81 Mg Tbec (Aspirin) .... Once daily 9)  Ergocalciferol 50000 Unit Caps (Ergocalciferol) .... One by mouth weekly 10)  Coq-10 100 Mg Caps (Coenzyme q10) .Marland Kitchen.. 1 once daily 11)  Metronidazole 250 Mg Tabs (Metronidazole) .... One by mouth qid x 5 days 12)  Amoxicillin 500 Mg Caps (Amoxicillin) .... One by mouth three times a day 7 day  Patient Instructions: 1)  Please schedule a follow-up appointment in 6 months. 2)  Hepatic Panel prior to visit, ICD-9:995.20 3)  Lipid Panel prior to visit, ICD-9:272.4 Prescriptions: AMOXICILLIN 500 MG CAPS (AMOXICILLIN) one by mouth three times a day 7 DAY  #21 x 0   Entered and Authorized by:   Stacie Glaze MD   Signed by:   Stacie Glaze MD on 03/13/2010   Method used:   Electronically to        [facility]. [phone]* (retail)       [address]       [place], [place]  [address]       Ph: [phone]       Fax: [phone]   RxID:   [id number] ERGOCALCIFEROL 50000 UNIT CAPS (ERGOCALCIFEROL) one by mouth weekly  #5 x 5   Entered and Authorized by:   Stacie Glaze MD   Signed by:   Stacie Glaze MD on 03/13/2010   Method used:   Electronically to        [facility]. [phone]* (retail)       [address]       [place], [place]  [address]       Ph: [phone]       Fax: [phone]   RxID:   [id number] METRONIDAZOLE 250 MG TABS (METRONIDAZOLE) one by mouth QID x 5 days  #20 x 0   Entered and Authorized by:   Stacie Glaze MD   Signed by:   Stacie Glaze MD on 03/13/2010   Method used:   Electronically to        [facility]. [phone]* (retail)       [address]       [place], [place]  [address]       Ph: [phone]       Fax: [phone]   RxID:   [id number]

## 2011-01-22 NOTE — Progress Notes (Signed)
Summary: Requesting RX for Beta Blockers  Phone Note Call from Patient Call back at Home Phone [phone]   Caller: Patient Complaint: Urinary/GYN Problems Summary of Call: I would like to get back on beta blockers 10mg  (half in the morning, half in the evening).  My rx has expired.  Let me know if Dr. Lovell Sheehan thinks this is the right thing to do.  Pharmacy is [facility]  Initial call taken by: Trixie Dredge,  August 03, 2010 9:50 AM    New/Updated Medications: BISOPROLOL FUMARATE 5 MG TABS (BISOPROLOL FUMARATE) 1/2 once daily Prescriptions: BISOPROLOL FUMARATE 5 MG TABS (BISOPROLOL FUMARATE) 1/2 once daily  #30 x 3   Entered by:   Willy Eddy, LPN   Authorized by:   Stacie Glaze MD   Signed by:   Willy Eddy, LPN on 09/81/1914   Method used:   Electronically to        [facility]. [phone]* (retail)       [address]       [place], [place]  [address]       Ph: [phone]       Fax: [phone]   RxID:   [id number]

## 2011-01-22 NOTE — Medication Information (Signed)
Summary: Androderm Approved  Androderm Approved   Imported By: Maryln Gottron 07/30/2010 13:51:34  _____________________________________________________________________  External Attachment:    Type:   Image     Comment:   External Document

## 2011-01-22 NOTE — Assessment & Plan Note (Signed)
Summary: CONGESTION, DIZZINESS // RS   Vital Signs:  Patient profile:   60 year old male Temp:     98.1 degrees F oral BP sitting:   122 / 84  (left arm) Cuff size:   large  Vitals Entered By: Sid Falcon LPN (February 22, 6577 11:02 AM) CC: congestion several days   History of Present Illness: Acute visit. Patient recently prescribed clindamycin for dental infection and started this couple days ago. Presents now with some diffuse abdominal cramping especially last light but no diarrhea. Somewhat decreased appetite. No nausea or vomiting. Had some chills last night. Abdominal cramping is diffuse. He has some thoracic back pains bilaterally and thinks this may be radiation from abdomen. No localizing abdominal pain. Denies fever. No stool changes.  No dysuria.  Mild nasal congestive symptoms.  Allergies: 1)  ! Vytorin (Ezetimibe-Simvastatin)  Past History:  Past Medical History: Last updated: 08/25/2007 Hypercholesterolemia Osteoarthritis  Past Surgical History: Last updated: 08/25/2007 Colonoscopy-01/29/2006 Total hip replacement  Social History: Last updated: 07/29/2007 Former Smoker Single Alcohol use-yes PMH-FH-SH reviewed for relevance  Review of Systems  The patient denies fever, weight loss, chest pain, headaches, hemoptysis, melena, hematochezia, severe indigestion/heartburn, and hematuria.    Physical Exam  General:  Well-developed,well-nourished,in no acute distress; alert,appropriate and cooperative throughout examination Ears:  External ear exam shows no significant lesions or deformities.  Otoscopic examination reveals clear canals, tympanic membranes are intact bilaterally without bulging, retraction, inflammation or discharge. Hearing is grossly normal bilaterally. Mouth:  Oral mucosa and oropharynx without lesions or exudates.  Teeth in good repair. Neck:  No deformities, masses, or tenderness noted. Lungs:  Normal respiratory effort, chest expands  symmetrically. Lungs are clear to auscultation, no crackles or wheezes. Heart:  Normal rate and regular rhythm. S1 and S2 normal without gallop, murmur, click, rub or other extra sounds. Abdomen:  soft, non-tender, normal bowel sounds, no distention, no masses, no guarding, no rigidity, no rebound tenderness, no abdominal hernia, no hepatomegaly, and no splenomegaly.   Msk:  normal ROM.  no back tenderness Extremities:  no edema   Impression & Recommendations:  Problem # 1:  ABDOMINAL PAIN, GENERALIZED (ICD-789.07) UA normal.  Suspect sec to clindamycin and he will check with dentist to see about change of Ab. Orders: UA Dipstick w/o Micro (manual) (46962)  Complete Medication List: 1)  Androgel Pump 1 % Gel (Testosterone) .... 3 pumps every day 2)  Ativan 1 Mg Tabs (Lorazepam) .... Once daily as needed 3)  Cialis 20 Mg Tabs (Tadalafil) .... As needed 4)  Crestor 10 Mg Tabs (Rosuvastatin calcium) .Marland Kitchen.. 1 once daily 5)  Valtrex 1 Gm Tabs (Valacyclovir hcl) .... 1/2 every day 6)  Mobic 15 Mg Tabs (Meloxicam) .... One by mouth daily 7)  Protonix 40 Mg Tbec (Pantoprazole sodium) .... Once daily 8)  Adult Aspirin Ec Low Strength 81 Mg Tbec (Aspirin) .... Once daily 9)  Buspirone Hcl 5 Mg Tabs (Buspirone hcl) .... One by mouth  two times a day as needed 10)  Vitamin D 95284 Unit Caps (Ergocalciferol) .... One by mouth weakly 11)  Bisoprolol Fumarate 5 Mg Tabs (Bisoprolol fumarate) .... 1/2 by mouth daily  Patient Instructions: 1)  Check with your dentist regarding possible discontinuation of Clindamycin which is likely causing abdominal cramps. 2)  Follow up promptly if you develop any fever or localizing abdominal pain.  Laboratory Results   Urine Tests    Routine Urinalysis   Color: lt. yellow Appearance: Clear Glucose: negative   (  Normal Range: Negative) Bilirubin: negative   (Normal Range: Negative) Ketone: negative   (Normal Range: Negative) Spec. Gravity: 1.010   (Normal  Range: 1.003-1.035) Blood: negative   (Normal Range: Negative) pH: 6.0   (Normal Range: 5.0-8.0) Protein: negative   (Normal Range: Negative) Urobilinogen: 0.2   (Normal Range: 0-1) Nitrite: negative   (Normal Range: Negative) Leukocyte Esterace: negative   (Normal Range: Negative)    Comments: Sid Falcon LPN  February 22, 6009 11:24 AM

## 2011-01-22 NOTE — Miscellaneous (Signed)
Summary: Orders Update  Clinical Lists Changes  Orders: Added new Test order of T-2 View CXR (71020TC) - Signed 

## 2011-01-22 NOTE — Assessment & Plan Note (Signed)
Summary: 6 month rov/njr/pt rsc from bmp/cjr   Vital Signs:  Patient profile:   60 year old male Height:      72 inches Weight:      210 pounds BMI:     28.58 Temp:     98.2 degrees F oral Pulse rate:   56 / minute Resp:     14 per minute BP sitting:   130 / 70  (left arm)  Vitals Entered By: Willy Eddy, LPN (September 20, 2010 11:02 AM) CC: roa labs Is Patient Diabetic? No   Primary Care Provider:  Stacie Glaze MD  CC:  roa labs.  History of Present Illness: Has AM SOB that is not exertional Has a hx of smoking until 30 and has secondary smoke exposure has increased palpitations but the BB that treats this  Has bigemny but no runs HAs seen Dr Claudie Fisherman in the past 48hr holter off the BB hx of panic attacks moderate family risk factors exercizes regularly' possible increase in ETOH  Preventive Screening-Counseling & Management  Alcohol-Tobacco     Smoking Status: quit     Tobacco Counseling: to remain off tobacco products  Problems Prior to Update: 1)  Hypogonadism  (ICD-257.2) 2)  Palpitations, Recurrent  (ICD-785.1) 3)  Idiopathic Osteoporosis  (ICD-733.02) 4)  Anxiety Depression  (ICD-300.4) 5)  Malaise and Fatigue  (ICD-780.79) 6)  Uns Advrs Eff Uns Rx Medicinal&biological Sbstnc  (ICD-995.20) 7)  Vitamin B12 Deficiency  (ICD-266.2) 8)  Dermatophytosis of Nail  (ICD-110.1) 9)  Contusion of Finger  (ICD-923.3) 10)  Preventive Health Care  (ICD-V70.0) 11)  Palpitations, Occasional  (ICD-785.1) 12)  Osteoarthritis  (ICD-715.90) 13)  Hyperlipidemia  (ICD-272.4)  Current Problems (verified): 1)  Hypogonadism  (ICD-257.2) 2)  Palpitations, Recurrent  (ICD-785.1) 3)  Idiopathic Osteoporosis  (ICD-733.02) 4)  Anxiety Depression  (ICD-300.4) 5)  Malaise and Fatigue  (ICD-780.79) 6)  Uns Advrs Eff Uns Rx Medicinal&biological Sbstnc  (ICD-995.20) 7)  Vitamin B12 Deficiency  (ICD-266.2) 8)  Dermatophytosis of Nail  (ICD-110.1) 9)  Contusion of Finger   (ICD-923.3) 10)  Preventive Health Care  (ICD-V70.0) 11)  Palpitations, Occasional  (ICD-785.1) 12)  Osteoarthritis  (ICD-715.90) 13)  Hyperlipidemia  (ICD-272.4)  Medications Prior to Update: 1)  Androgel Pump 1 % Gel (Testosterone) .... 3 Pumps Every Day 2)  Ativan 1 Mg Tabs (Lorazepam) .... Once Daily As Needed 3)  Cialis 20 Mg Tabs (Tadalafil) .... As Needed 4)  Crestor 10 Mg Tabs (Rosuvastatin Calcium) .Marland Kitchen.. 1 Once Daily 5)  Valtrex 1 Gm  Tabs (Valacyclovir Hcl) .... 1/2 Every Day 6)  Aspirin 325 Mg Tabs (Aspirin) .... Two By Mouth Daily 7)  Protonix 40 Mg  Tbec (Pantoprazole Sodium) .... Once Daily 8)  Adult Aspirin Ec Low Strength 81 Mg  Tbec (Aspirin) .... Once Daily 9)  Ergocalciferol 50000 Unit Caps (Ergocalciferol) .... One By Mouth Weekly 10)  Coq-10 100 Mg Caps (Coenzyme Q10) .Marland Kitchen.. 1 Once Daily 11)  Metronidazole 250 Mg Tabs (Metronidazole) .... One By Mouth Qid X 5 Days 12)  Doxycycline Hyclate 100 Mg Tabs (Doxycycline Hyclate) .... One By Mouth Two Times A Day X 7 Days 13)  Bisoprolol Fumarate 5 Mg Tabs (Bisoprolol Fumarate) .... 1/2 Once Daily  Current Medications (verified): 1)  Androgel Pump 1 % Gel (Testosterone) .... 3 Pumps Every Day 2)  Ativan 1 Mg Tabs (Lorazepam) .... Once Daily As Needed 3)  Cialis 20 Mg Tabs (Tadalafil) .... As Needed 4)  Crestor 10  Mg Tabs (Rosuvastatin Calcium) .Marland Kitchen.. 1 Once Daily 5)  Valtrex 1 Gm  Tabs (Valacyclovir Hcl) .... 1/2 Every Day 6)  Aspirin 325 Mg Tabs (Aspirin) .... Two By Mouth Daily 7)  Protonix 40 Mg  Tbec (Pantoprazole Sodium) .... Once Daily 8)  Adult Aspirin Ec Low Strength 81 Mg  Tbec (Aspirin) .... Once Daily 9)  Ergocalciferol 50000 Unit Caps (Ergocalciferol) .... One By Mouth Weekly 10)  Coq-10 100 Mg Caps (Coenzyme Q10) .Marland Kitchen.. 1 Once Daily 11)  Bisoprolol Fumarate 5 Mg Tabs (Bisoprolol Fumarate) .... 1/2 Once Daily 12)  Buspirone Hcl 5 Mg Tabs (Buspirone Hcl) .... One By Mouth  Q 6 Hours As Needed  Allergies  (verified): 1)  ! Vytorin (Ezetimibe-Simvastatin)  Past History:  Social History: Last updated: 07/29/2007 Former Smoker Single Alcohol use-yes  Risk Factors: Smoking Status: quit (09/20/2010)  Past medical, surgical, family and social histories (including risk factors) reviewed, and no changes noted (except as noted below).  Past Medical History: Reviewed history from 08/25/2007 and no changes required. Hypercholesterolemia Osteoarthritis  Past Surgical History: Reviewed history from 08/25/2007 and no changes required. Colonoscopy-01/29/2006 Total hip replacement  Family History: Reviewed history and no changes required.  Social History: Reviewed history from 07/29/2007 and no changes required. Former Smoker Single Alcohol use-yes  Review of Systems  The patient denies anorexia, fever, weight loss, weight gain, vision loss, decreased hearing, hoarseness, chest pain, syncope, dyspnea on exertion, peripheral edema, prolonged cough, headaches, hemoptysis, abdominal pain, melena, hematochezia, severe indigestion/heartburn, hematuria, incontinence, genital sores, muscle weakness, suspicious skin lesions, transient blindness, difficulty walking, depression, unusual weight change, abnormal bleeding, enlarged lymph nodes, angioedema, breast masses, and testicular masses.         Flu Vaccine Consent Questions     Do you have a history of severe allergic reactions to this vaccine? no    Any prior history of allergic reactions to egg and/or gelatin? no    Do you have a sensitivity to the preservative Thimersol? no    Do you have a past history of Guillan-Barre Syndrome? no    Do you currently have an acute febrile illness? no    Have you ever had a severe reaction to latex? no    Vaccine information given and explained to patient? yes    Are you currently pregnant? no    Lot Number:[id number]   Exp Date:06/22/2011   Site Given  Left Deltoid IM   Physical Exam  General:   Well-developed,well-nourished,in no acute distress; alert,appropriate and cooperative throughout examination Head:  Normocephalic and atraumatic without obvious abnormalities. No apparent alopecia or balding. Eyes:  pupils equal and pupils round.   Ears:  R ear normal and L ear normal.   Nose:  no external deformity.   Neck:  No deformities, masses, or tenderness noted. Chest Wall:  No deformities, masses, tenderness or gynecomastia noted. Lungs:  Normal respiratory effort, chest expands symmetrically. Lungs are clear to auscultation, no crackles or wheezes. Heart:  Normal rate and regular rhythm. S1 and S2 normal without gallop, murmur, click, rub or other extra sounds. Abdomen:  soft and non-tender.   Psych:  Oriented X3 and slightly anxious.     Impression & Recommendations:  Problem # 1:  PALPITATIONS, RECURRENT (ICD-785.1) discussion of ETOH and its effect as well  ans the consistant use of the BB His updated medication list for this problem includes:    Bisoprolol Fumarate 5 Mg Tabs (Bisoprolol fumarate) .Marland Kitchen... 1/2 once daily  Avoid caffeine, chocolate, and  stimulants. Stress reduction as well as medication options discussed.   Problem # 2:  MALAISE AND FATIGUE (ICD-780.79)  Problem # 3:  HYPERLIPIDEMIA (ICD-272.4)  His updated medication list for this problem includes:    Crestor 10 Mg Tabs (Rosuvastatin calcium) .Marland Kitchen... 1 once daily  Labs Reviewed: SGOT: 46 (09/13/2010)   SGPT: 51 (09/13/2010)  Lipid Goals: Chol Goal: 200 (05/03/2008)   HDL Goal: 40 (05/03/2008)   LDL Goal: 100 (05/03/2008)   TG Goal: 150 (05/03/2008)  Prior 10 Yr Risk Heart Disease: 22 % (05/03/2008)   HDL:38.90 (09/13/2010), 46.60 (03/06/2010)  LDL:93 (09/13/2010), 76 (03/06/2010)  Chol:165 (09/13/2010), 157 (03/06/2010)  Trig:168.0 (09/13/2010), 171.0 (03/06/2010)  Problem # 4:  ANXIETY DEPRESSION (ICD-300.4) buspar... makes him giddy  Problem # 5:  OSTEOARTHRITIS (ICD-715.90) Assessment:  Unchanged  His updated medication list for this problem includes:    Aspirin 325 Mg Tabs (Aspirin) .Marland Kitchen..Marland Kitchen Two by mouth daily    Adult Aspirin Ec Low Strength 81 Mg Tbec (Aspirin) ..... Once daily  Discussed use of medications, application of heat or cold, and exercises.   Complete Medication List: 1)  Androgel Pump 1 % Gel (Testosterone) .... 3 pumps every day 2)  Ativan 1 Mg Tabs (Lorazepam) .... Once daily as needed 3)  Cialis 20 Mg Tabs (Tadalafil) .... As needed 4)  Crestor 10 Mg Tabs (Rosuvastatin calcium) .Marland Kitchen.. 1 once daily 5)  Valtrex 1 Gm Tabs (Valacyclovir hcl) .... 1/2 every day 6)  Aspirin 325 Mg Tabs (Aspirin) .... Two by mouth daily 7)  Protonix 40 Mg Tbec (Pantoprazole sodium) .... Once daily 8)  Adult Aspirin Ec Low Strength 81 Mg Tbec (Aspirin) .... Once daily 9)  Ergocalciferol 50000 Unit Caps (Ergocalciferol) .... One by mouth weekly 10)  Coq-10 100 Mg Caps (Coenzyme q10) .Marland Kitchen.. 1 once daily 11)  Bisoprolol Fumarate 5 Mg Tabs (Bisoprolol fumarate) .... 1/2 once daily 12)  Buspirone Hcl 5 Mg Tabs (Buspirone hcl) .... One by mouth  q 6 hours as needed  Other Orders: Admin 1st Vaccine (44010) Flu Vaccine 10yrs + (27253)  Patient Instructions: 1)  Hold off the holter for now 2)  try a 30 day consistant use of 1/2 table at day of the BB at bed time 3)  Please schedule a follow-up appointment in Jan for cpx Prescriptions: ATIVAN 1 MG TABS (LORAZEPAM) once daily as needed  #30 x 1   Entered and Authorized by:   Stacie Glaze MD   Signed by:   Stacie Glaze MD on 09/20/2010   Method used:   Print then Give to Patient   RxID:   [id number] BUSPIRONE HCL 5 MG TABS (BUSPIRONE HCL) one by mouth  q 6 hours as needed  #30 x 6   Entered and Authorized by:   Stacie Glaze MD   Signed by:   Stacie Glaze MD on 09/20/2010   Method used:   Electronically to        [facility]. [phone]* (retail)       [address]       [place], [place]  [address]       Ph: [phone]       Fax: [phone]   RxID:   [id number]

## 2011-02-07 NOTE — Assessment & Plan Note (Signed)
Summary: CPX // RS rsc bmp/njr   Vital Signs:  Patient profile:   60 year old male Height:      72 inches Weight:      210 pounds BMI:     28.58 Temp:     98.1 degrees F oral Pulse rate:   60 / minute Resp:     14 per minute BP sitting:   140 / 84  (left arm)  Vitals Entered By: Willy Eddy, LPN (January 16, 2011 2:04 PM) CC: cpx Is Patient Diabetic? No   Primary Care Provider:  Stacie Glaze MD  CC:  cpx.  History of Present Illness: The pt was asked about all immunizations, health maint. services that are appropriate to their age and was given guidance on diet exercize  and weight management  the patient has multiple complaints today including painful contractures in his palm and along his feet that are consistent with Dupuytren's contracture. he is concerned that he is not sleeping adequately but when he takes melatonin a regular basis he is able to sleep we encouraged that he continue to take melatonin rather than use over-the-counter sleep aids. he is concerned about a slight umbilical hernia that has not changed in size since his last examination it still remains less than a nickel in size in the 11 o'clock position.  testosterone level was obtained and he has recurrent gonadal hypofunction.  The patient notices increased fatigue which may be related to his low testosterone  Preventive Screening-Counseling & Management  Alcohol-Tobacco     Smoking Status: quit     Tobacco Counseling: to remain off tobacco products  Problems Prior to Update: 1)  Umbilical Hernia  (ICD-553.1) 2)  Testicular Hypofunction  (ICD-257.2) 3)  Dupuytren's Contracture  (ICD-728.6) 4)  Unspecified Vitamin D Deficiency  (ICD-268.9) 5)  Hypogonadism  (ICD-257.2) 6)  Palpitations, Recurrent  (ICD-785.1) 7)  Idiopathic Osteoporosis  (ICD-733.02) 8)  Anxiety Depression  (ICD-300.4) 9)  Malaise and Fatigue  (ICD-780.79) 10)  Uns Advrs Eff Uns Rx Medicinal&biological Sbstnc  (ICD-995.20) 11)   Vitamin B12 Deficiency  (ICD-266.2) 12)  Dermatophytosis of Nail  (ICD-110.1) 13)  Contusion of Finger  (ICD-923.3) 14)  Preventive Health Care  (ICD-V70.0) 15)  Palpitations, Occasional  (ICD-785.1) 16)  Osteoarthritis  (ICD-715.90) 17)  Hyperlipidemia  (ICD-272.4)  Current Problems (verified): 1)  Hypogonadism  (ICD-257.2) 2)  Palpitations, Recurrent  (ICD-785.1) 3)  Idiopathic Osteoporosis  (ICD-733.02) 4)  Anxiety Depression  (ICD-300.4) 5)  Malaise and Fatigue  (ICD-780.79) 6)  Uns Advrs Eff Uns Rx Medicinal&biological Sbstnc  (ICD-995.20) 7)  Vitamin B12 Deficiency  (ICD-266.2) 8)  Dermatophytosis of Nail  (ICD-110.1) 9)  Contusion of Finger  (ICD-923.3) 10)  Preventive Health Care  (ICD-V70.0) 11)  Palpitations, Occasional  (ICD-785.1) 12)  Osteoarthritis  (ICD-715.90) 13)  Hyperlipidemia  (ICD-272.4)  Medications Prior to Update: 1)  Androgel Pump 1 % Gel (Testosterone) .... 3 Pumps Every Day 2)  Ativan 1 Mg Tabs (Lorazepam) .... Once Daily As Needed 3)  Cialis 20 Mg Tabs (Tadalafil) .... As Needed 4)  Crestor 10 Mg Tabs (Rosuvastatin Calcium) .Marland Kitchen.. 1 Once Daily 5)  Valtrex 1 Gm  Tabs (Valacyclovir Hcl) .... 1/2 Every Day 6)  Aspirin 325 Mg Tabs (Aspirin) .... Two By Mouth Daily 7)  Protonix 40 Mg  Tbec (Pantoprazole Sodium) .... Once Daily 8)  Adult Aspirin Ec Low Strength 81 Mg  Tbec (Aspirin) .... Once Daily 9)  Ergocalciferol 50000 Unit Caps (Ergocalciferol) .... One  By Mouth Weekly 10)  Coq-10 100 Mg Caps (Coenzyme Q10) .Marland Kitchen.. 1 Once Daily 11)  Bisoprolol Fumarate 5 Mg Tabs (Bisoprolol Fumarate) .... 1/2 Once Daily 12)  Buspirone Hcl 5 Mg Tabs (Buspirone Hcl) .... One By Mouth  Q 6 Hours As Needed 13)  Meloxicam 15 Mg Tabs (Meloxicam) .Marland Kitchen.. 1 Once Daily  Current Medications (verified): 1)  Androgel Pump 20.25 Mg/act (1.62%) Gel (Testosterone) .... Two Pumps To  Axillae 2)  Ativan 1 Mg Tabs (Lorazepam) .... Once Daily As Needed 3)  Cialis 20 Mg Tabs (Tadalafil) ....  As Needed 4)  Crestor 10 Mg Tabs (Rosuvastatin Calcium) .Marland Kitchen.. 1 Once Daily 5)  Valtrex 1 Gm  Tabs (Valacyclovir Hcl) .... 1/2 Every Day 6)  Protonix 40 Mg  Tbec (Pantoprazole Sodium) .... Once Daily 7)  Adult Aspirin Ec Low Strength 81 Mg  Tbec (Aspirin) .... Once Daily 8)  Ergocalciferol 50000 Unit Caps (Ergocalciferol) .... One By Mouth Weekly 9)  Coq-10 100 Mg Caps (Coenzyme Q10) .Marland Kitchen.. 1 Once Daily 10)  Bisoprolol Fumarate 5 Mg Tabs (Bisoprolol Fumarate) .... 1/2 Once Daily 11)  Buspirone Hcl 5 Mg Tabs (Buspirone Hcl) .... One By Mouth  Q 6 Hours As Needed 12)  Meloxicam 15 Mg Tabs (Meloxicam) .Marland Kitchen.. 1 Once Daily 13)  Tagamet Hb 200 Mg Tabs (Cimetidine) .... Two By Mouth Two Times A Day  Allergies (verified): 1)  ! Vytorin (Ezetimibe-Simvastatin)  Past History:  Social History: Last updated: 07/29/2007 Former Smoker Single Alcohol use-yes  Risk Factors: Smoking Status: quit (01/16/2011)  Past medical, surgical, family and social histories (including risk factors) reviewed, and no changes noted (except as noted below).  Past Medical History: Reviewed history from 08/25/2007 and no changes required. Hypercholesterolemia Osteoarthritis  Past Surgical History: Reviewed history from 08/25/2007 and no changes required. Colonoscopy-01/29/2006 Total hip replacement  Family History: Reviewed history and no changes required.  Social History: Reviewed history from 07/29/2007 and no changes required. Former Smoker Single Alcohol use-yes  Review of Systems  The patient denies anorexia, fever, weight loss, weight gain, vision loss, decreased hearing, hoarseness, chest pain, syncope, dyspnea on exertion, peripheral edema, prolonged cough, headaches, hemoptysis, abdominal pain, melena, hematochezia, severe indigestion/heartburn, hematuria, incontinence, genital sores, muscle weakness, suspicious skin lesions, transient blindness, difficulty walking, depression, unusual weight change,  abnormal bleeding, enlarged lymph nodes, angioedema, breast masses, and testicular masses.    Physical Exam  General:  Well-developed,well-nourished,in no acute distress; alert,appropriate and cooperative throughout examination Head:  Normocephalic and atraumatic without obvious abnormalities. No apparent alopecia or balding. Eyes:  pupils equal and pupils round.   Ears:  R ear normal and L ear normal.   Nose:  no external deformity.   Mouth:  Oral mucosa and oropharynx without lesions or exudates.  Teeth in good repair. Neck:  No deformities, masses, or tenderness noted. Chest Wall:  No deformities, masses, tenderness or gynecomastia noted. Lungs:  Normal respiratory effort, chest expands symmetrically. Lungs are clear to auscultation, no crackles or wheezes. Heart:  Normal rate and regular rhythm. S1 and S2 normal without gallop, murmur, click, rub or other extra sounds. Abdomen:   Mall 2 cm umbilical hernia at the 11:00 position Rectal:  external hemorrhoid(s).   Genitalia:  circumcised and no urethral discharge.   Prostate:  no gland enlargement and no asymmetry.   Msk:  normal ROM.  no back tenderness Dupuytren's contractures of the right hand and left foot Pulses:  R and L carotid,radial,femoral,dorsalis pedis and posterior tibial pulses are full and  equal bilaterally Extremities:  No clubbing, cyanosis, edema, or deformity noted with normal full range of motion of all joints.   Neurologic:  No cranial nerve deficits noted. Station and gait are normal. Plantar reflexes are down-going bilaterally. DTRs are symmetrical throughout. Sensory, motor and coordinative functions appear intact. Skin:  Intact without suspicious lesions or rashes Cervical Nodes:  No lymphadenopathy noted Axillary Nodes:  No palpable lymphadenopathy Inguinal Nodes:  No significant adenopathy Psych:  Cognition and judgment appear intact. Alert and cooperative with normal attention span and concentration. No  apparent delusions, illusions, hallucinations   Impression & Recommendations:  Problem # 1:  OSTEOARTHRITIS (ICD-715.90) Assessment Deteriorated  The following medications were removed from the medication list:    Aspirin 325 Mg Tabs (Aspirin) .Marland Kitchen..Marland Kitchen Two by mouth daily His updated medication list for this problem includes:    Adult Aspirin Ec Low Strength 81 Mg Tbec (Aspirin) ..... Once daily    Meloxicam 15 Mg Tabs (Meloxicam) .Marland Kitchen... 1 once daily  Discussed use of medications, application of heat or cold, and exercises.   Problem # 2:  DUPUYTREN'S CONTRACTURE (ICD-728.6) Assessment: Deteriorated Informed consen obtained and then the tendons was prepped in a sterile manor and 20 mg depo and 1/4 cc 1% lidocaine injected into the synovial space. After care discussed. Pt tolerated procedure well.  Problem # 3:  ANXIETY DEPRESSION (ICD-300.4) Assessment: Improved  Problem # 4:  IDIOPATHIC OSTEOPOROSIS (ICD-733.02) moniter vitamin d Discussed medication use, applications of heat or ice, and exercises.   Problem # 5:  MALAISE AND FATIGUE (ICD-780.79) due to the low testosterone  Problem # 6:  TESTICULAR HYPOFUNCTION (ICD-257.2) Assessment: Unchanged  Problem # 7:  PREVENTIVE HEALTH CARE (ICD-V70.0) Assessment: Unchanged The pt was asked about all immunizations, health maint. services that are appropriate to their age and was given guidance on diet exercize  and weight management shingles vacccine Colonoscopy: repeat in 10 years (01/29/2006) Td Booster: Historical (12/23/2005)   Flu Vax: Fluvax 3+ (09/20/2010)   Chol: 187 (01/08/2011)   HDL: 39.10 (01/08/2011)   LDL: 114 (01/08/2011)   TG: 169.0 (01/08/2011) TSH: 1.79 (01/08/2011)   PSA: 3.25 (01/08/2011) Next Colonoscopy due:: 01/2016 (03/14/2009)  Discussed using sunscreen, use of alcohol, drug use, self testicular exam, routine dental care, routine eye care, routine physical exam, seat belts, multiple vitamins, osteoporosis  prevention, adequate calcium intake in diet, and recommendations for immunizations.  Discussed exercise and checking cholesterol.  Discussed gun safety, safe sex, and contraception. Also recommend checking PSA.  Complete Medication List: 1)  Androgel Pump 20.25 Mg/act (1.62%) Gel (Testosterone) .... Two pumps to  axillae 2)  Ativan 1 Mg Tabs (Lorazepam) .... Once daily as needed 3)  Cialis 20 Mg Tabs (Tadalafil) .... As needed 4)  Crestor 10 Mg Tabs (Rosuvastatin calcium) .Marland Kitchen.. 1 once daily 5)  Valtrex 1 Gm Tabs (Valacyclovir hcl) .... 1/2 every day 6)  Protonix 40 Mg Tbec (Pantoprazole sodium) .... Once daily 7)  Adult Aspirin Ec Low Strength 81 Mg Tbec (Aspirin) .... Once daily 8)  Ergocalciferol 50000 Unit Caps (Ergocalciferol) .... One by mouth weekly 9)  Coq-10 100 Mg Caps (Coenzyme q10) .Marland Kitchen.. 1 once daily 10)  Bisoprolol Fumarate 5 Mg Tabs (Bisoprolol fumarate) .... 1/2 once daily 11)  Buspirone Hcl 5 Mg Tabs (Buspirone hcl) .... One by mouth  q 6 hours as needed 12)  Meloxicam 15 Mg Tabs (Meloxicam) .Marland Kitchen.. 1 once daily 13)  Tagamet Hb 200 Mg Tabs (Cimetidine) .... Two by mouth two times a  day  Other Orders: T-Vitamin D (25-Hydroxy) [id number]) Venipuncture (09811) Specimen Handling (91478) Zoster (Shingles) Vaccine Live [phone]) Admin 1st Vaccine (13086) TLB-B12 + Folate Pnl ([id number]-B12/FOL)  Patient Instructions: 1)  tagamet instead of the protonix for the flat warts trial for 90 days 2)  Please schedule a follow-up appointment in 3 months. 3)  testosterone level prior 257.2 Prescriptions: ANDROGEL PUMP 20.25 MG/ACT (1.62%) GEL (TESTOSTERONE) two pumps to  axillae  #2 x 11   Entered and Authorized by:   Stacie Glaze MD   Signed by:   Stacie Glaze MD on 01/16/2011   Method used:   Print then Give to Patient   RxID:   [id number]    Orders Added: 1)  T-Vitamin D (25-Hydroxy) [44010-27253] 2)  Venipuncture [66440] 3)  Est. Patient 65& > [34742] 4)  Est.  Patient Level IV [59563] 5)  Specimen Handling [99000] 6)  Zoster (Shingles) Vaccine Live [90736] 7)  Admin 1st Vaccine [90471] 8)  TLB-B12 + Folate Pnl [[id number]-B12/FOL]   Immunizations Administered:  Zostavax # 1:    Vaccine Type: Zostavax    Site: left deltoid    Mfr: Merck    Dose: 0.5 ml    Route: [place]    Given by: Willy Eddy, LPN    Exp. Date: 10/24/2011    Lot #: [id number]    VIS given: 10/04/05 given January 16, 2011.   Immunizations Administered:  Zostavax # 1:    Vaccine Type: Zostavax    Site: left deltoid    Mfr: Merck    Dose: 0.5 ml    Route: [place]    Given by: Willy Eddy, LPN    Exp. Date: 10/24/2011    Lot #: [id number]    VIS given: 10/04/05 given January 16, 2011.    Prevention & Chronic Care Immunizations   Influenza vaccine: Fluvax 3+  (09/20/2010)    Tetanus booster: 12/23/2005: Historical   Tetanus booster due: 12/24/2015    Pneumococcal vaccine: Not documented    H. zoster vaccine: 01/16/2011: Zostavax  Colorectal Screening   Hemoccult: Not documented    Colonoscopy: repeat in 10 years  (01/29/2006)   Colonoscopy due: 01/2016  Other Screening   PSA: 3.25  (01/08/2011)   Smoking status: quit  (01/16/2011)  Lipids   Total Cholesterol: 187  (01/08/2011)   LDL: 114  (01/08/2011)   LDL Direct: 152.5  (02/02/2008)   HDL: 39.10  (01/08/2011)   Triglycerides: 169.0  (01/08/2011)    SGOT (AST): 41  (01/08/2011)   SGPT (ALT): 47  (01/08/2011)   Alkaline phosphatase: 52  (01/08/2011)   Total bilirubin: 0.5  (01/08/2011)  Self-Management Support :    Lipid self-management support: Not documented    Nursing Instructions: Give Herpes zoster vaccine today

## 2011-04-10 ENCOUNTER — Other Ambulatory Visit: Payer: Self-pay | Admitting: Internal Medicine

## 2011-04-16 ENCOUNTER — Other Ambulatory Visit: Payer: Self-pay

## 2011-04-23 ENCOUNTER — Ambulatory Visit (INDEPENDENT_AMBULATORY_CARE_PROVIDER_SITE_OTHER): Payer: BC Managed Care – PPO | Admitting: Internal Medicine

## 2011-04-23 ENCOUNTER — Encounter: Payer: Self-pay | Admitting: Internal Medicine

## 2011-04-23 VITALS — BP 140/80 | HR 76 | Temp 98.2°F | Resp 14 | Ht 72.0 in | Wt 208.0 lb

## 2011-04-23 DIAGNOSIS — K219 Gastro-esophageal reflux disease without esophagitis: Secondary | ICD-10-CM | POA: Insufficient documentation

## 2011-04-23 DIAGNOSIS — K589 Irritable bowel syndrome without diarrhea: Secondary | ICD-10-CM | POA: Insufficient documentation

## 2011-04-23 DIAGNOSIS — R197 Diarrhea, unspecified: Secondary | ICD-10-CM

## 2011-04-23 DIAGNOSIS — E291 Testicular hypofunction: Secondary | ICD-10-CM

## 2011-04-23 LAB — TESTOSTERONE: Testosterone: 296.74 ng/dL — ABNORMAL LOW (ref 350.00–890.00)

## 2011-04-23 NOTE — Progress Notes (Signed)
  Subjective:    Patient ID: Jonathan Young, male    DOB: 1951/06/27, 60 y.o.   MRN: [id number]  HPI Patient presents for followup of hypogonadism having resumed the use of the testosterone gel he is applying it to the arms and under her area of the muscle to see if we can be sent to extend and improve the levels.  The testosterone level will be drawn today he also has chronic reflux and requires the use of Protonix on a daily basis otherwise if symptoms recur we discussed the possibility of an EGD he gets breakthrough on the Protonix.  He also has chronic loose stools we will screen for O&P to rule out Giardia but I believe this is more likely to be irritable bowel talked about adding fiber to his diet reducing stress   Review of Systems  Constitutional: Negative for fever and fatigue.  HENT: Negative for hearing loss, congestion, neck pain and postnasal drip.   Eyes: Negative for discharge, redness and visual disturbance.  Respiratory: Negative for cough, shortness of breath and wheezing.   Cardiovascular: Negative for leg swelling.  Gastrointestinal: Negative for abdominal pain, constipation and abdominal distention.  Genitourinary: Negative for urgency and frequency.  Musculoskeletal: Negative for joint swelling and arthralgias.  Skin: Negative for color change and rash.  Neurological: Negative for weakness and light-headedness.  Hematological: Negative for adenopathy.  Psychiatric/Behavioral: Negative for behavioral problems.   Past Medical History  Diagnosis Date  . Hypercholesteremia   . Osteoarthritis    Past Surgical History  Procedure Date  . Total hip arthroplasty     reports that he has quit smoking. He does not have any smokeless tobacco history on file. He reports that he drinks alcohol. He reports that he does not use illicit drugs. family history is not on file. Allergies  Allergen Reactions  . Ezetimibe-Simvastatin         Objective:   Physical Exam    Constitutional: He is oriented to person, place, and time. He appears well-developed and well-nourished.  HENT:  Head: Normocephalic and atraumatic.  Eyes: Conjunctivae are normal. Pupils are equal, round, and reactive to light.  Neck: Normal range of motion. Neck supple.  Cardiovascular: Normal rate and regular rhythm.   Pulmonary/Chest: Effort normal and breath sounds normal.  Abdominal: Soft. Bowel sounds are normal.  Musculoskeletal: Normal range of motion.  Neurological: He is alert and oriented to person, place, and time.          Assessment & Plan:

## 2011-04-23 NOTE — Assessment & Plan Note (Signed)
The patient has diarrhea we think this is more related to irritable bowel it could be related to proton pump inhibitor use however we will screen with stool cultures for O&P and the school cultures are negative would treat his irritable bowel

## 2011-04-23 NOTE — Assessment & Plan Note (Signed)
monitoring the effect of the androgel

## 2011-04-23 NOTE — Assessment & Plan Note (Signed)
Patient has recurrent gastroesophageal reflux is on Protonix try Tagamet but did not control her symptoms so he now is on chronic Protonix

## 2011-05-10 NOTE — Op Note (Signed)
NAMEKATHY, WARES                ACCOUNT NO.:  [id number]   MEDICAL RECORD NO.:  [id number]          PATIENT TYPE:  INP   LOCATION:  1519                         FACILITY:  [facility]   PHYSICIAN:  Ollen Gross, M.D.    DATE OF BIRTH:  1951/05/12   DATE OF PROCEDURE:  10/03/2005  DATE OF DISCHARGE:                                 OPERATIVE REPORT   PREOPERATIVE DIAGNOSIS:  Avascular necrosis, right hip.   POSTOPERATIVE DIAGNOSIS:  Avascular necrosis, right hip.   PROCEDURE:  Right total hip arthroplasty.   SURGEON:  Ollen Gross, M.D.   ASSISTANT:  Alexzandrew L. Julien Girt, P.A.   ANESTHESIA:  General.   ESTIMATED BLOOD LOSS:  200.   DRAINS:  Hemovac x1.   COMPLICATIONS:  None.   CONDITION:  Stable to the recovery room.   INDICATIONS:  Mahamed is a 60 year old male who has severe osteonecrosis,  right great left hip with intractable pain. right hip.  He has failed  nonoperative management and presents now for right total hip arthroplasty.   PROCEDURE IN DETAIL:  After the successful administration of general  anesthetic, the patient was placed in a left lateral decubitus position with  the right side up and held with the hip positioner.  The right lower  extremity was isolated from his perineum with plastic drapes and prepped and  draped in the usual sterile fashion.  Short posterolateral incision is made  with a 10 blade through the subcutaneous tissue to the level of the fascia  lata which was incised in line with the skin incision.  Sciatic nerve is  palpated and protected and the short rotators isolated off the femur.  Capsulectomy was performed, and the hip is dislocated.  Center of femoral  head is marked, and a trial prosthesis placed just at the center of the  trial head and corresponds to the center of his native femoral head.  Osteotomy line is marked on the femoral neck and osteotomy made with an  oscillating saw.  The femoral head is removed and the femur  retracted  anteriorly to gain acetabular exposure.   Acetabular labrum was removed.  There was hypertrophic tissue in the fovea  which was also removed.  Reaming starts at 45 mm to get down to the fovea,  and then we reamed and increments of 2 mm up to 53 mm and a subsequently  placed a 54 mm pinnacle acetabular shell in anatomic position with excellent  press fit.  I transfixed it with two dome screws with excellent purchase.  Trial 36 mm neutral liner was placed.   The femur is prepared, first with the canal finder and irrigation.  Axial  reaming is performed to 15.5 mm, proximal reaming to a 20D, and s sleeve  machine to a large. A 20D large trial sleeve is placed with 20 x 15 stem and  36+ 8 neck matching his native anteversion.  With a 36+ 0 head there was a  little bit of soft tissue laxity upon reduction so we went with a 36+ 6 head  which had great tension on  the tissues.  He had full extension, full  external rotation, 70 degrees flexion, 40 degrees adduction, 90 degrees  internal rotation and 90 degrees of flexion and 90 degrees internal  rotation.  By placing the right leg on top of the left, his leg lengths were  found to be equal.  The trial prosthesis then removed and then the permanent  apex hole eliminator was placed into the acetabular shell.  Permanent 36 mm  neutral Ultramet metal liner was placed.  This is a metal-on-metal hip  replacement. The 20D large sleeve is placed with a 20 x 15 stem and 36+ 8  neck matching his native anteversion.  The 36+ 6 head is placed and the hip  was reduced the same stability parameters.  The wound was copiously  irrigated with saline solution and short rotators reattached to the femur  through drill holes.  The fascia lata was closed over a Hemovac drain with  interrupted #1 Vicryl, subcutaneous closed with 1-0 and 2-0 Vicryl and  subcuticular running 4-0 Monocryl; 20 cc of 4% Marcaine were injected at the  subcutaneous tissues.   Steri-Strips and bulky sterile dressing were applied.  Drains hooked to suction.  The knee was placed into a knee immobilizer.  Awakened and transferred to recovery in stable condition.      Ollen Gross, M.D.  Electronically Signed     FA/MEDQ  D:  10/03/2005  T:  10/04/2005  Job:  [id number]

## 2011-05-10 NOTE — H&P (Signed)
NAMEKYAL, ARTS                ACCOUNT NO.:  [id number]   MEDICAL RECORD NO.:  [id number]          PATIENT TYPE:  INP   LOCATION:  1519                         FACILITY:  [facility]   PHYSICIAN:  Ollen Gross, M.D.    DATE OF BIRTH:  January 16, 1951   DATE OF ADMISSION:  10/03/2005  DATE OF DISCHARGE:                                HISTORY & PHYSICAL   DATE OF OFFICE VISIT/HISTORY AND PHYSICAL:  September 12, 2005.   DATE OF ADMISSION:  October 03, 2005.   CHIEF COMPLAINT:  Right hip pain.   HISTORY OF PRESENT ILLNESS:  Patient is a 60 year old male seen by Dr.  Despina Hick in second opinion for ongoing right hip pain.  He has been  previously diagnosed with osteonecrosis involving the hip.  It has been  progressively getting worse.  He is extremely active and works out a great  deal.  He has had an MRI in the past that confirmed his avascular necrosis.  His pain has been progressive here recently.  He has been told in the past  he needs hip replacement.  He has been seen by Dr. Despina Hick.  X-rays are  reviewed, which do show some significant subchondral sclerosis in the right  hip.  Laterally, he has an area of collapse in the superolateral aspect.  It  is felt he has reached a point where he could benefit from undergoing total  hip replacement.  Risks and benefits have been discussed with the patient,  He elects to proceed with surgery.   ALLERGIES:  No known drug allergies.   CURRENT MEDICATIONS:  1.  Crestor 20 mg.  2.  AndroGel.  3.  Diclofenac.  4.  Percocet.   PAST MEDICAL HISTORY:  1.  History of palpitations.  2.  Hypercholesterolemia.  3.  Questionable heart murmur.   PAST SURGICAL HISTORY:  1.  Left shoulder surgery in 2005.  2.  Right shoulder surgery in 1997.   SOCIAL HISTORY:  Single.  He is a Veterinary surgeon for 23 years.  Nonsmoker, however,  did smoke 2-3 packs per day x14 years but quit back in 1982.  Alcohol:  1-2  drinks per day.  No children.   FAMILY HISTORY:  An  uncle with a history of diabetes.  Grandmother with a  history of cancer.   REVIEW OF SYSTEMS:  GENERAL:  No fevers, chills, night sweats.  NEURO:  No  seizures, syncope, paralysis.  RESPIRATORY:  No shortness of breath,  productive cough, or hemoptysis.  CARDIOVASCULAR:  No chest pain, angina, or  orthopnea.  GI:  No nausea, vomiting, diarrhea, or constipation.  GU:  No  dysuria, hematuria, or discharge.  MUSCULOSKELETAL:  Right hip, found in the  history of present illness.   PHYSICAL EXAMINATION:  VITAL SIGNS:  Pulse 76, respirations 12, blood  pressure 120/84.  GENERAL:  A 60 year old white male who is well-developed and well-nourished  in no acute distress.  Alert, oriented and cooperative.  Pleasant at the  time of exam.  He is a good historian.  HEENT:  Normocephalic and atraumatic.  Pupils are  round and reactive.  Oropharynx is clear.  EOMs are intact.  NECK:  Supple.  No carotid bruits.  CHEST:  Clear anterior and posterior chest wall.  No rales, rhonchi or  wheezes.  HEART:  Regular rate and rhythm without murmur.  S1 and S2 noted.  ABDOMEN:  Soft, nontender.  Bowel sounds present.  RECTAL/BREASTS/GENITALIA:  Not done.  Not pertinent to the present illness.  EXTREMITIES:  Right hip: The right hip shows flexion of 120 degrees.  There  is 10 degrees of internal rotation, 25 degrees of external rotation, and 30  degrees of abduction; however, he does ambulate with an antalgic gait.   IMPRESSION:  1.  Avascular necrosis, right hip.  2.  History of palpitations.  3.  Hypercholesterolemia.   PLAN:  Patient admitted to [facility] to undergo right total hip  arthroplasty.  Surgery will be performed by Dr. Trudee Grip.      Alexzandrew L. Julien Girt, P.A.      Ollen Gross, M.D.  Electronically Signed    ALP/MEDQ  D:  10/03/2005  T:  10/03/2005  Job:  [id number]   cc:   Stacie Glaze, M.D. [facility]  [address] [place]  [place] [address]   Rollene Rotunda, M.D.  [address]  [address]  [place]  [place] [address]

## 2011-05-10 NOTE — Discharge Summary (Signed)
[name], [name]                ACCOUNT NO.:  [id number]   MEDICAL RECORD NO.:  [id number]          PATIENT TYPE:  INP   LOCATION:  1519                         FACILITY:  [facility]   PHYSICIAN:  Ollen Gross, M.D.    DATE OF BIRTH:  11/13/1951   DATE OF ADMISSION:  10/03/2005  DATE OF DISCHARGE:  10/07/2005                                 DISCHARGE SUMMARY   ADMISSION DIAGNOSES:  1.  Avascular necrosis, right hip.  2.  History of palpitations.  3.  Hypercholesterolemia.   DISCHARGE DIAGNOSES:  1.  Avascular necrosis, right hip, status post right total hip arthroplasty.  2.  Mild postoperative hyponatremia, improved.  3.  History of palpitations.  4.  Hypercholesterolemia.   PROCEDURE:  On October 03, 2005, right total hip.   SURGEON:  Ollen Gross, M.D.   ASSISTANT:  Alexzandrew L. Perkins, P.A.-C.   ANESTHESIA:  General.   BLOOD LOSS:  200 mL.   CONSULTATIONS:  None.   BRIEF HISTORY:  Jonathan [name] is a 60 year old male with severe osteonecrosis, right  hip greater than left hip with intractable pain, failed non-operative  management, now presents for total hip.   LABORATORY DATA:  CBC, hemoglobin 15.6, hematocrit 45.8, differential within  normal limits.  Postoperative hemoglobin 15.4.  Last H&H 12.9 and 37.5.  PT/PTT preoperative 13.0 and 33 respectively with serial pro times to  follow.  Last PT/INR 21.1 and 1.8.  Chem panel on admission all within  normal limits with the exception of mildly elevated glucose of 138.  Serial  BMET are as follows: Sodium dropped from 141 to 134, back up to 136.  Preoperative UA negative.  Blood type of O positive.  Preoperative chest x-  ray September 26, 2005, no active cardiopulmonary disease.  Preoperative hip  films September 26, 2005: Both femoral heads were located.  Increased density  involves femoral head consistent with given history of AVN.   HOSPITAL COURSE:  Admitted to [facility], taken to the OR,  underwent above  procedure, tolerated it well.  Day #1, he virtually had no  pain, has not required much pain medication, anticipated that he would do  well.  Hemovac drain was pulled.  H&H was pending that morning.  It was not  drawn.  It was re-ordered.  Started him on some Dulcolax tablets and partial  weightbearing.  Started getting up with physical therapy.  Dressing was  changed on day #2.  He got up and ambulated 50 feet.  Foley was discontinued  along with his PCAs and IVs.  Continued to progress well and got up to 150  feet.  INR was becoming therapeutic.  Stayed in the hospital until October 07, 2005 at which point he was ambulating greater than 200 feet, tolerating  p.o. medications, incision looked good with the exception of some small  blisters proximal to the incision, and doing well, was discharged home.   DISCHARGE PLAN:  1.  Discharged home on October 07, 2005.  2.  Discharge diagnosis, please see above.  3.  Discharge medications: Coumadin, Percocet, Robaxin.  Diet  is tolerated.      Follow up in two weeks.  Activity: Partial weightbearing, 25-50%.  Hip      precautions.  Total hip protocol.  Home health PT, home health nursing.   DISPOSITION:  Home.   CONDITION ON DISCHARGE:  Improved.      Alexzandrew L. Julien Girt, P.A.      Ollen Gross, M.D.  Electronically Signed    ALP/MEDQ  D:  11/16/2005  T:  11/16/2005  Job:  [id number]   cc:   Ollen Gross, M.D.  Fax: [phone]   Stacie Glaze, M.D. [facility]  [address] [place]  [place] [address]   Rollene Rotunda, M.D.  [address]  [address]  [place]  [place] [address]

## 2011-05-15 ENCOUNTER — Other Ambulatory Visit: Payer: Self-pay | Admitting: Internal Medicine

## 2011-05-24 ENCOUNTER — Other Ambulatory Visit: Payer: Self-pay | Admitting: Internal Medicine

## 2011-06-25 ENCOUNTER — Other Ambulatory Visit: Payer: Self-pay | Admitting: Internal Medicine

## 2011-07-25 ENCOUNTER — Other Ambulatory Visit: Payer: Self-pay | Admitting: Internal Medicine

## 2011-08-02 LAB — OVA AND PARASITE SCREEN

## 2011-08-05 LAB — STOOL CULTURE

## 2011-08-07 ENCOUNTER — Telehealth: Payer: Self-pay | Admitting: *Deleted

## 2011-08-07 DIAGNOSIS — K529 Noninfective gastroenteritis and colitis, unspecified: Secondary | ICD-10-CM

## 2011-08-07 NOTE — Telephone Encounter (Signed)
Pt would like his stool sample results.

## 2011-08-07 NOTE — Telephone Encounter (Signed)
Please note- first call - the documentation was put on in error-please disregard-per dr Lovell Sheehan- all stools ok except he had nonpatholgenic protoza commonly found in the intestinal tract but never associated with illnes.

## 2011-08-07 NOTE — Telephone Encounter (Signed)
Per dr Yvonne Kendall go to gi for diarrhea-chronic-pt informed

## 2011-08-07 NOTE — Telephone Encounter (Signed)
All labs win- per dr Nigel Mormon sure she drinks liquid with electrolites and stop vesicare-pt informed

## 2011-08-08 ENCOUNTER — Telehealth: Payer: Self-pay | Admitting: Gastroenterology

## 2011-08-08 NOTE — Telephone Encounter (Signed)
Left message on machine to call back  

## 2011-08-08 NOTE — Telephone Encounter (Signed)
Pt scheduled for 08/13/11 per Aurther Loft Dr Lovell Sheehan office

## 2011-08-13 ENCOUNTER — Encounter: Payer: Self-pay | Admitting: Gastroenterology

## 2011-08-13 ENCOUNTER — Ambulatory Visit (INDEPENDENT_AMBULATORY_CARE_PROVIDER_SITE_OTHER): Payer: BC Managed Care – PPO | Admitting: Gastroenterology

## 2011-08-13 ENCOUNTER — Other Ambulatory Visit: Payer: Self-pay | Admitting: Internal Medicine

## 2011-08-13 ENCOUNTER — Telehealth: Payer: Self-pay | Admitting: Gastroenterology

## 2011-08-13 VITALS — BP 130/90 | HR 80 | Ht 73.0 in | Wt 209.0 lb

## 2011-08-13 DIAGNOSIS — R197 Diarrhea, unspecified: Secondary | ICD-10-CM

## 2011-08-13 MED ORDER — METRONIDAZOLE 250 MG PO TABS: 250.0000 mg | ORAL_TABLET | Freq: Three times a day (TID) | ORAL | Status: AC

## 2011-08-13 NOTE — Patient Instructions (Signed)
Dr. Christella Hartigan will investigate the E. Nana in your stool, will get back to you about antibiotics recs. If those antibiotics don't help, then you will need a colonoscopy. A copy of this information will be made available to Dr. Lovell Sheehan.

## 2011-08-13 NOTE — Telephone Encounter (Signed)
entolimax nana is probably non-pathogenic.  BUT let's try treating him with flagyl 250mg  po tid for 10 days (no refills).  He needs to call here in 3 weeks to report on his symptoms.  Please call him.  Thanks

## 2011-08-13 NOTE — Progress Notes (Signed)
HPI: This is a  very pleasant 60 year old man whom I last saw about 5 years ago for a routine colonoscopy. 2 hyperplastic polyps were removed from his colon and he was recommended to have a repeat colonoscopy in 10 year interval.  Has a loud stomach, grumling a lot.  BMs more frequent, 3-4 times a day, more watery.    Changes started 5-6 months ago.  Has noticed bright red blood on occasion.   Had hemorrhoidal surgery with 'scarring afterwards'  Stool testing was done.  E. Nana was grown.  He has had giardia in the past.  Overall stable weight.  Bloats a lot.  Takes mobic once a day for joint pains.    Review of systems: Pertinent positive and negative review of systems were noted in the above HPI section.  All other review of systems was otherwise negative.   Past Medical History  Diagnosis Date  . Hypercholesteremia   . Osteoarthritis     Past Surgical History  Procedure Date  . Total hip arthroplasty   . Hemorrhoid surgery 1982  . Tonsillectomy and adenoidectomy      reports that he has quit smoking. He has never used smokeless tobacco. He reports that he drinks alcohol. He reports that he does not use illicit drugs.  family history includes Emphysema in his mother.    Current Medications, Allergies were all reviewed with the patient via [facility] electronic medical record system.    Physical Exam: BP 130/90  Pulse 80  Ht 6\' 1"  (1.854 m)  Wt 209 lb (94.802 kg)  BMI 27.57 kg/m2 Constitutional: generally well-appearing Psychiatric: alert and oriented x3 Eyes: extraocular movements intact Mouth: oral pharynx moist, no lesions Neck: supple no lymphadenopathy Cardiovascular: heart regular rate and rhythm Lungs: clear to auscultation bilaterally Abdomen: soft, nontender, nondistended, no obvious ascites, no peritoneal signs, normal bowel sounds Extremities: no lower extremity edema bilaterally Skin: no lesions on visible extremities    Assessment and  plan: 60 y.o. male with chronic diarrhea  His stool ova and parasite testing was abnormal. E. Nana was isolated.  It is not clear if this is pathogenic but I do recall one case with the same organism grown in the past year or 2 in which I treated the patient in their symptoms resolved. I am going to do some literature search and will get back to him with further recommendations. Likely we will try an antibiotic and his symptoms resolved then that was the answer. If his symptoms do not resolve then we will likely need repeat colonoscopy.

## 2011-08-13 NOTE — Telephone Encounter (Signed)
Pt aware and med sent pt also notified NOT to drink any alcohol while on this medication

## 2011-09-20 ENCOUNTER — Other Ambulatory Visit: Payer: Self-pay | Admitting: Internal Medicine

## 2011-10-29 ENCOUNTER — Other Ambulatory Visit: Payer: Self-pay | Admitting: Internal Medicine

## 2011-11-01 ENCOUNTER — Other Ambulatory Visit: Payer: Self-pay | Admitting: Internal Medicine

## 2011-12-06 ENCOUNTER — Other Ambulatory Visit: Payer: Self-pay | Admitting: Internal Medicine

## 2011-12-31 ENCOUNTER — Encounter: Payer: Self-pay | Admitting: Internal Medicine

## 2011-12-31 ENCOUNTER — Ambulatory Visit (INDEPENDENT_AMBULATORY_CARE_PROVIDER_SITE_OTHER): Payer: BC Managed Care – PPO | Admitting: Internal Medicine

## 2011-12-31 VITALS — BP 140/90 | HR 72 | Temp 98.3°F | Resp 16 | Ht 73.0 in | Wt 210.0 lb

## 2011-12-31 DIAGNOSIS — K140 Glossitis: Secondary | ICD-10-CM

## 2011-12-31 DIAGNOSIS — Z20828 Contact with and (suspected) exposure to other viral communicable diseases: Secondary | ICD-10-CM

## 2011-12-31 DIAGNOSIS — H9319 Tinnitus, unspecified ear: Secondary | ICD-10-CM

## 2011-12-31 DIAGNOSIS — R5383 Other fatigue: Secondary | ICD-10-CM

## 2011-12-31 LAB — VITAMIN B12: Vitamin B-12: >1500 pg/mL — ABNORMAL HIGH (ref 211–911)

## 2011-12-31 NOTE — Patient Instructions (Addendum)
The patient is instructed to continue all medications as prescribed. Schedule followup with check out clerk upon leaving the clinic Halls immune defense zinc and vit c

## 2011-12-31 NOTE — Progress Notes (Signed)
Subjective:    Patient ID: Jonathan Young, male    DOB: [birth year], 61 y.o.   MRN: [id number]  HPI Acute sinus symptoms with vertigo Has been using saline lavage Has been on crestor for lipids Using sudafed. Increased tinnitis Had a aphthous ulcer, was seen in urgent care and treated with duke's majic mouth wash and this did not help... These lesion seem to occur with eating? After many foods this occurs. Has a hx of b12 deficincy. No hx of thyroid dz. Hx of diarrhea seeing GI.    Review of Systems  Constitutional: Negative for fever and fatigue.  HENT: Negative for hearing loss, congestion, neck pain and postnasal drip.   Eyes: Negative for discharge, redness and visual disturbance.  Respiratory: Negative for cough, shortness of breath and wheezing.   Cardiovascular: Negative for leg swelling.  Gastrointestinal: Negative for abdominal pain, constipation and abdominal distention.  Genitourinary: Negative for urgency and frequency.  Musculoskeletal: Negative for joint swelling and arthralgias.  Skin: Negative for color change and rash.  Neurological: Negative for weakness and light-headedness.  Hematological: Negative for adenopathy.  Psychiatric/Behavioral: Negative for behavioral problems.   Past Medical History  Diagnosis Date  . Hypercholesteremia   . Osteoarthritis     History   Social History  . Marital Status: Single    Spouse Name: N/A    Number of Children: N/A  . Years of Education: N/A   Occupational History  . Not on file.   Social History Main Topics  . Smoking status: Former Games developer  . Smokeless tobacco: Never Used  . Alcohol Use: Yes  . Drug Use: No  . Sexually Active: Not on file   Other Topics Concern  . Not on file   Social History Narrative  . No narrative on file    Past Surgical History  Procedure Date  . Total hip arthroplasty   . Hemorrhoid surgery 1982  . Tonsillectomy and adenoidectomy     Family History  Problem Relation Age  of Onset  . Emphysema Mother     Allergies  Allergen Reactions  . Ezetimibe-Simvastatin     Current Outpatient Prescriptions on File Prior to Visit  Medication Sig Dispense Refill  . ANDROGEL PUMP 20.25 MG/ACT (1.62%) GEL apply 2 PUMPS TO AXILLAE AS DIRECTED  150 g  5  . aspirin 81 MG EC tablet Take 81 mg by mouth daily.        . bisoprolol (ZEBETA) 5 MG tablet take 1/2 tablet by mouth once daily  30 tablet  1  . CIALIS 20 MG tablet TAKE 1 TABLET 1 HOUR BEFORE SEXUAL ACTIVITY. DO NOT EXCEED 1 DOSE IN 24 HOURS.  4 tablet  5  . Coenzyme Q10 (COQ-10) 100 MG capsule Take 100 mg by mouth daily.        . CRESTOR 10 MG tablet take 1 tablet by mouth once daily  30 tablet  4  . ergocalciferol (VITAMIN D2) 50000 UNITS capsule take 1 capsule by mouth every week  5 capsule  5  . LORazepam (ATIVAN) 1 MG tablet take 1 tablet by mouth once daily if needed  30 tablet  1  . meloxicam (MOBIC) 15 MG tablet TAKE ONE TABLET BY MOUTH ONE TIME DAILY  30 tablet  6  . pantoprazole (PROTONIX) 40 MG tablet TAKE 1 TABLET BY MOUTH ONCE DAILY  30 tablet  4  . Testosterone (ANDROGEL PUMP TD) Apply topically. 3 pumps every day       .  valACYclovir (VALTREX) 1000 MG tablet Take 500 mg by mouth daily.          BP 140/90  Pulse 72  Temp 98.3 F (36.8 C)  Resp 16  Ht 6\' 1"  (1.854 m)  Wt 210 lb (95.255 kg)  BMI 27.71 kg/m2       Objective:   Physical Exam  Nursing note and vitals reviewed. Constitutional: He appears well-developed and well-nourished.  HENT:  Head: Normocephalic and atraumatic.  Eyes: Conjunctivae are normal. Pupils are equal, round, and reactive to light.  Neck: Normal range of motion. Neck supple.  Cardiovascular: Normal rate and regular rhythm.   Pulmonary/Chest: Effort normal and breath sounds normal.  Abdominal: Soft. Bowel sounds are normal.          Assessment & Plan:  Atypical glossitis. Patient has postprandial glossitis with a sense that his tongue is tender to touch and  on examination there is moderate erythema and some increased size of papules but no obvious lesions.  He was treated with mouth wash and antifungal and failed these therapies.  He does have a history of chronic diarrhea with a history of a B12 deficiency he has also been on Flagyl for the diarrhea.  We will monitor hisr thyroid B12 and vitamin parameters and consider placing him on B12 injections if the B12 levels are below 400.  We have discussed his tinnitus and will refer him for audiology he has also been asked to decrease nonsteroidal juice for 2 week period and monitor the effect on the tinnitus.  His most severe complaint is his fatigue for which we'll measure her thyroid profile as well as a Malachi Carl profile since he believes he was exposed to Bradd Canary virus approximately one month ago

## 2012-01-01 LAB — EPSTEIN-BARR VIRUS VCA ANTIBODY PANEL: EBV EA IgG: 1.83 {ISR} — ABNORMAL HIGH

## 2012-01-22 ENCOUNTER — Telehealth: Payer: Self-pay | Admitting: *Deleted

## 2012-01-22 DIAGNOSIS — K146 Glossodynia: Secondary | ICD-10-CM

## 2012-01-22 MED ORDER — DOXYCYCLINE HYCLATE 100 MG PO TABS: 100.0000 mg | ORAL_TABLET | Freq: Two times a day (BID) | ORAL | Status: AC

## 2012-01-22 NOTE — Telephone Encounter (Signed)
Mouth is raw and tongue is raw.  Wonders if he has MRSA.  Can he have a culture?

## 2012-01-22 NOTE — Telephone Encounter (Signed)
Per dr Lovell Sheehan doxycyline 100 bid for 10 days and see ent

## 2012-01-22 NOTE — Telephone Encounter (Signed)
Notified pt. 

## 2012-02-19 ENCOUNTER — Other Ambulatory Visit (INDEPENDENT_AMBULATORY_CARE_PROVIDER_SITE_OTHER): Payer: BC Managed Care – PPO

## 2012-02-19 DIAGNOSIS — Z0000 Encounter for general adult medical examination without abnormal findings: Secondary | ICD-10-CM

## 2012-02-19 LAB — POCT URINALYSIS DIPSTICK
Blood, UA: NEGATIVE
Nitrite, UA: NEGATIVE
Protein, UA: NEGATIVE
Spec Grav, UA: 1.02
Urobilinogen, UA: 0.2
pH, UA: 5.5

## 2012-02-19 LAB — BASIC METABOLIC PANEL
BUN: 20 mg/dL (ref 6–23)
Chloride: 107 mEq/L (ref 96–112)
Potassium: 4.6 mEq/L (ref 3.5–5.1)
Sodium: 141 mEq/L (ref 135–145)

## 2012-02-19 LAB — LIPID PANEL
LDL Cholesterol: 111 mg/dL — ABNORMAL HIGH (ref 0–99)
Total CHOL/HDL Ratio: 4
Triglycerides: 196 mg/dL — ABNORMAL HIGH (ref 0.0–149.0)

## 2012-02-19 LAB — HEPATIC FUNCTION PANEL
ALT: 49 U/L (ref 0–53)
AST: 46 U/L — ABNORMAL HIGH (ref 0–37)
Albumin: 4.3 g/dL (ref 3.5–5.2)
Total Bilirubin: 0.6 mg/dL (ref 0.3–1.2)
Total Protein: 6.8 g/dL (ref 6.0–8.3)

## 2012-02-19 LAB — CBC WITH DIFFERENTIAL/PLATELET
Basophils Relative: 0.9 % (ref 0.0–3.0)
Eosinophils Relative: 6.2 % — ABNORMAL HIGH (ref 0.0–5.0)
HCT: 45.3 % (ref 39.0–52.0)
Hemoglobin: 15.3 g/dL (ref 13.0–17.0)
Lymphs Abs: 2.2 10*3/uL (ref 0.7–4.0)
MCV: 93.5 fl (ref 78.0–100.0)
Monocytes Absolute: 0.5 10*3/uL (ref 0.1–1.0)
Monocytes Relative: 8.8 % (ref 3.0–12.0)
Neutro Abs: 2.4 10*3/uL (ref 1.4–7.7)
Platelets: 211 10*3/uL (ref 150.0–400.0)
WBC: 5.6 10*3/uL (ref 4.5–10.5)

## 2012-02-19 LAB — PSA: PSA: 3.85 ng/mL (ref 0.10–4.00)

## 2012-02-19 LAB — TSH: TSH: 1.73 u[IU]/mL (ref 0.35–5.50)

## 2012-02-25 ENCOUNTER — Ambulatory Visit (INDEPENDENT_AMBULATORY_CARE_PROVIDER_SITE_OTHER): Payer: BC Managed Care – PPO | Admitting: Internal Medicine

## 2012-02-25 ENCOUNTER — Encounter: Payer: Self-pay | Admitting: Internal Medicine

## 2012-02-25 VITALS — BP 140/84 | HR 68 | Temp 98.2°F | Resp 16 | Ht 73.0 in | Wt 208.0 lb

## 2012-02-25 DIAGNOSIS — N419 Inflammatory disease of prostate, unspecified: Secondary | ICD-10-CM

## 2012-02-25 DIAGNOSIS — IMO0002 Reserved for concepts with insufficient information to code with codable children: Secondary | ICD-10-CM

## 2012-02-25 DIAGNOSIS — M5410 Radiculopathy, site unspecified: Secondary | ICD-10-CM

## 2012-02-25 DIAGNOSIS — G4710 Hypersomnia, unspecified: Secondary | ICD-10-CM

## 2012-02-25 DIAGNOSIS — M720 Palmar fascial fibromatosis [Dupuytren]: Secondary | ICD-10-CM

## 2012-02-25 DIAGNOSIS — R0681 Apnea, not elsewhere classified: Secondary | ICD-10-CM

## 2012-02-25 DIAGNOSIS — Z0000 Encounter for general adult medical examination without abnormal findings: Secondary | ICD-10-CM

## 2012-02-25 MED ORDER — DOXYCYCLINE HYCLATE 100 MG PO TABS: 100.0000 mg | ORAL_TABLET | Freq: Two times a day (BID) | ORAL | Status: AC

## 2012-02-25 MED ORDER — METHYLPREDNISOLONE ACETATE PF 40 MG/ML IJ SUSP: 20.0000 mg | Freq: Once | INTRAMUSCULAR | Status: DC

## 2012-02-25 NOTE — Patient Instructions (Signed)
The patient is instructed to continue all medications as prescribed. Schedule followup with check out clerk upon leaving the clinic  

## 2012-02-25 NOTE — Progress Notes (Signed)
Subjective:    Patient ID: Jonathan Young, male    DOB: 1951/06/15, 61 y.o.   MRN: [id number]  HPI Increased HIP pain on the left with hx of right hip surgery. The patient has a history of recent back injury.  He was lifting an aluminum boat and had acute back pain that pain has resolved but that is about the same time that the right hip pain began.  We will start the evaluation with back x-rays if it does show any L4 through S1 disc narrowing I would recommend an MRI performed at surgery.   Patient has Dupuytren's contracture of the left hand and we will inject that today  The patient presents for his annual physical   Review of Systems  Constitutional: Negative for fever and fatigue.  HENT: Negative for hearing loss, congestion, neck pain and postnasal drip.   Eyes: Negative for discharge, redness and visual disturbance.  Respiratory: Negative for cough, shortness of breath and wheezing.   Cardiovascular: Negative for leg swelling.  Gastrointestinal: Negative for abdominal pain, constipation and abdominal distention.  Genitourinary: Negative for urgency and frequency.  Musculoskeletal: Negative for joint swelling and arthralgias.  Skin: Negative for color change and rash.  Neurological: Negative for weakness and light-headedness.  Hematological: Negative for adenopathy.  Psychiatric/Behavioral: Negative for behavioral problems.   Past Medical History  Diagnosis Date  . Hypercholesteremia   . Osteoarthritis     History   Social History  . Marital Status: Single    Spouse Name: N/A    Number of Children: N/A  . Years of Education: N/A   Occupational History  . Not on file.   Social History Main Topics  . Smoking status: Former Games developer  . Smokeless tobacco: Never Used  . Alcohol Use: Yes  . Drug Use: No  . Sexually Active: Not on file   Other Topics Concern  . Not on file   Social History Narrative  . No narrative on file    Past Surgical History    Procedure Date  . Total hip arthroplasty   . Hemorrhoid surgery 1982  . Tonsillectomy and adenoidectomy     Family History  Problem Relation Age of Onset  . Emphysema Mother     Allergies  Allergen Reactions  . Ezetimibe-Simvastatin     Current Outpatient Prescriptions on File Prior to Visit  Medication Sig Dispense Refill  . ANDROGEL PUMP 20.25 MG/ACT (1.62%) GEL apply 2 PUMPS TO AXILLAE AS DIRECTED  150 g  5  . aspirin 81 MG EC tablet Take 81 mg by mouth daily.        Marland Kitchen CIALIS 20 MG tablet TAKE 1 TABLET 1 HOUR BEFORE SEXUAL ACTIVITY. DO NOT EXCEED 1 DOSE IN 24 HOURS.  4 tablet  5  . Coenzyme Q10 (COQ-10) 100 MG capsule Take 100 mg by mouth daily.        . CRESTOR 10 MG tablet take 1 tablet by mouth once daily  30 tablet  4  . Cyanocobalamin (B-12 DOTS) 500 MCG TBDP Take 500 mcg by mouth daily.        . ergocalciferol (VITAMIN D2) 50000 UNITS capsule take 1 capsule by mouth every week  5 capsule  5  . LORazepam (ATIVAN) 1 MG tablet take 1 tablet by mouth once daily if needed  30 tablet  1  . meloxicam (MOBIC) 15 MG tablet TAKE ONE TABLET BY MOUTH ONE TIME DAILY  30 tablet  6  . pantoprazole (PROTONIX) 40  MG tablet TAKE 1 TABLET BY MOUTH ONCE DAILY  30 tablet  4  . Testosterone (ANDROGEL PUMP TD) Apply topically. 3 pumps every day       . valACYclovir (VALTREX) 1000 MG tablet Take 500 mg by mouth daily.          BP 140/84  Pulse 68  Temp 98.2 F (36.8 C)  Resp 16  Ht 6\' 1"  (1.854 m)  Wt 208 lb (94.348 kg)  BMI 27.44 kg/m2       Objective:   Physical Exam  Nursing note and vitals reviewed. Constitutional: He is oriented to person, place, and time. He appears well-developed and well-nourished.  HENT:  Head: Normocephalic and atraumatic.  Eyes: Conjunctivae are normal. Pupils are equal, round, and reactive to light.  Neck: Normal range of motion. Neck supple.  Cardiovascular: Normal rate and regular rhythm.   Pulmonary/Chest: Effort normal and breath sounds normal.   Abdominal: Soft. Bowel sounds are normal.  Musculoskeletal: Normal range of motion.  Neurological: He is alert and oriented to person, place, and time.  Skin: Skin is warm and dry.  Psychiatric: He has a normal mood and affect. His behavior is normal.          Assessment & Plan:   Patient presents for yearly preventative medicine examination.   all immunizations and health maintenance protocols were reviewed with the patient and they are up to date with these protocols.   screening laboratory values were reviewed with the patient including screening of hyperlipidemia PSA renal function and hepatic function.   There medications past medical history social history problem list and allergies were reviewed in detail.   Goals were established with regard to weight loss exercise diet in compliance with medications   Patient has acute on chronic prostatitis we'll place him on doxycycline 100 mg by mouth twice a day for 30 days pregnant back in 30 days and remeasure his PSA I suspect the PSA was low at that time.  The patient also has Dupuytren's contracture of the left hand fourth digit patient gave informed consent he was prepped with Betadine and 20 mg of Depo-Medrol and 4 cc of lidocaine was injected into the tendon sleeve for the calcification  We discussed the patient's hip pain and I suspect that he may have low back pain as well and it may be radicular pain radiating into the right hip therefore we will obtain plain films and a low tolerance for organ MRI plain films suggest that he has displaced narrowing E. the the L4-L5 or L5-S1 space

## 2012-02-26 ENCOUNTER — Other Ambulatory Visit: Payer: Self-pay | Admitting: Internal Medicine

## 2012-02-26 ENCOUNTER — Other Ambulatory Visit: Payer: Self-pay | Admitting: *Deleted

## 2012-02-26 DIAGNOSIS — G4730 Sleep apnea, unspecified: Secondary | ICD-10-CM

## 2012-02-27 ENCOUNTER — Ambulatory Visit (INDEPENDENT_AMBULATORY_CARE_PROVIDER_SITE_OTHER)
Admission: RE | Admit: 2012-02-27 | Discharge: 2012-02-27 | Disposition: A | Discharge status: Home / Self Care | Payer: BC Managed Care – PPO | Source: Ambulatory Visit | Attending: Internal Medicine | Admitting: Internal Medicine

## 2012-02-27 DIAGNOSIS — M5410 Radiculopathy, site unspecified: Secondary | ICD-10-CM

## 2012-02-27 DIAGNOSIS — IMO0002 Reserved for concepts with insufficient information to code with codable children: Secondary | ICD-10-CM

## 2012-03-08 ENCOUNTER — Encounter (HOSPITAL_COMMUNITY): Payer: Self-pay | Admitting: *Deleted

## 2012-03-08 ENCOUNTER — Other Ambulatory Visit: Payer: Self-pay

## 2012-03-08 ENCOUNTER — Emergency Department (HOSPITAL_COMMUNITY)
Admission: EM | Admit: 2012-03-08 | Discharge: 2012-03-08 | Disposition: A | Discharge status: Home / Self Care | Payer: BC Managed Care – PPO | Attending: Emergency Medicine | Admitting: Emergency Medicine

## 2012-03-08 ENCOUNTER — Ambulatory Visit (HOSPITAL_BASED_OUTPATIENT_CLINIC_OR_DEPARTMENT_OTHER): Payer: BC Managed Care – PPO

## 2012-03-08 DIAGNOSIS — M1990 Unspecified osteoarthritis, unspecified site: Secondary | ICD-10-CM | POA: Insufficient documentation

## 2012-03-08 DIAGNOSIS — Z96649 Presence of unspecified artificial hip joint: Secondary | ICD-10-CM | POA: Insufficient documentation

## 2012-03-08 DIAGNOSIS — I4949 Other premature depolarization: Secondary | ICD-10-CM | POA: Insufficient documentation

## 2012-03-08 DIAGNOSIS — R1013 Epigastric pain: Secondary | ICD-10-CM

## 2012-03-08 DIAGNOSIS — R079 Chest pain, unspecified: Secondary | ICD-10-CM | POA: Insufficient documentation

## 2012-03-08 DIAGNOSIS — I493 Ventricular premature depolarization: Secondary | ICD-10-CM

## 2012-03-08 DIAGNOSIS — R1110 Vomiting, unspecified: Secondary | ICD-10-CM | POA: Insufficient documentation

## 2012-03-08 DIAGNOSIS — R112 Nausea with vomiting, unspecified: Secondary | ICD-10-CM | POA: Insufficient documentation

## 2012-03-08 DIAGNOSIS — E7800 Pure hypercholesterolemia, unspecified: Secondary | ICD-10-CM | POA: Insufficient documentation

## 2012-03-08 LAB — BASIC METABOLIC PANEL WITH GFR
BUN: 18 mg/dL (ref 6–23)
CO2: 20 meq/L (ref 19–32)
Calcium: 9.7 mg/dL (ref 8.4–10.5)
Chloride: 103 meq/L (ref 96–112)
Creatinine, Ser: 1.23 mg/dL (ref 0.50–1.35)
Glucose, Bld: 108 mg/dL — ABNORMAL HIGH (ref 70–99)

## 2012-03-08 LAB — CBC
HCT: 46.4 % (ref 39.0–52.0)
Hemoglobin: 16.2 g/dL (ref 13.0–17.0)
MCH: 31.8 pg (ref 26.0–34.0)
MCHC: 34.9 g/dL (ref 30.0–36.0)
MCV: 91 fL (ref 78.0–100.0)
Platelets: 246 K/uL (ref 150–400)
RBC: 5.1 MIL/uL (ref 4.22–5.81)
RDW: 12.7 % (ref 11.5–15.5)
WBC: 7.7 K/uL (ref 4.0–10.5)

## 2012-03-08 LAB — BASIC METABOLIC PANEL
GFR calc Af Amer: 72 mL/min — ABNORMAL LOW (ref >90)
GFR calc non Af Amer: 62 mL/min — ABNORMAL LOW (ref >90)
Potassium: 4.7 mEq/L (ref 3.5–5.1)
Sodium: 140 mEq/L (ref 135–145)

## 2012-03-08 LAB — GLUCOSE, CAPILLARY: Glucose-Capillary: 112 mg/dL — ABNORMAL HIGH (ref 70–99)

## 2012-03-08 LAB — TROPONIN I: Troponin I: <0.3 ng/mL (ref <0.30)

## 2012-03-08 NOTE — ED Notes (Signed)
Pt states he was on a bicycle at the gym after lifting weights when he began to have sharp left chest pain.  Pt states pain lasted <1 minute, but he became anxious and felt as though his heart was out of rhythm.  Pt also felt "light headed" and SOB.  Pt stopped his workout and went outside to get some fresh air, whereupon he vomited x1.  Pt is clammy to touch, but has good peripheral pulses.  Pt has occasional PVC's on monitor and an audible pause upon auscultation of heart sounds.  Pt denies pain currently.  Pt states he can tell when he is having a PVC because he feels it.

## 2012-03-08 NOTE — ED Notes (Signed)
Pt states he was at the gym on a bicycle when he began to have sharp left chest pain.  Pt vomited x1 shortly after pain began.  Pt also states he felt SOB with onset of pain.  Pt denies dizziness.

## 2012-03-08 NOTE — ED Provider Notes (Addendum)
History     CSN: [id number]  Arrival date & time 03/08/12  7829   First MD Initiated Contact with Patient 03/08/12 [id number]      Chief Complaint  Patient presents with  . Chest Pain  . Vomiting    x1 after onset of pain    (Consider location/radiation/quality/duration/timing/severity/associated sxs/prior treatment) HPI Comments: Patient reports that he had gotten up this morning and taken a tablet of doxycycline which he has been taking for suspected bacterial prostatitis. He has been on the doxycycline for the past 6 days. She medication on an empty stomach. He went to the gymnasium where he had done some weight work and then had started exercising on a bicycle and about 4 minutes into it he developed profuse sweating and felt slightly lightheaded. He reports he felt some sharp chest pains and felt short of breath. He stopped and he reports about 10-15 minutes afterwards his symptoms seem to be improving but decided to come here to the emergency apartment based on the symptoms. He reports he did have to stop driving and vomited once on the side of the road. Upon arrival here to the [facility], the patient reports his symptoms have now just about completely resolved. She reports that he exercised for 40 minutes in a cardiovascular fashion 2 days ago without any symptoms. He reports no history of coronary disease. He has a history of high cholesterol. He is a former smoker. He had a normal stress test with Dr. Antoine Poche with the [facility] cardiology in 2006.  Pt reports feeling much improved and would like to go home if possible.  Pt did have a little epigastric discomfort while on the bicycle.  No long distance travel recently.  No pleurisy.    Patient is a 61 y.o. male presenting with chest pain. The history is provided by the patient.  Chest Pain The chest pain began less than 1 hour ago. Primary symptoms include abdominal pain, nausea and vomiting. Pertinent negatives for primary symptoms  include no fever, no shortness of breath and no cough.  Associated symptoms include diaphoresis.     Past Medical History  Diagnosis Date  . Hypercholesteremia   . Osteoarthritis   . Arrhythmia   . PVC's (premature ventricular contractions)     Past Surgical History  Procedure Date  . Total hip arthroplasty   . Hemorrhoid surgery 1982  . Tonsillectomy and adenoidectomy     Family History  Problem Relation Age of Onset  . Emphysema Mother     History  Substance Use Topics  . Smoking status: Former Games developer  . Smokeless tobacco: Never Used  . Alcohol Use: 3.5 oz/week    7 drink(s) per week      Review of Systems  Constitutional: Positive for diaphoresis. Negative for fever and chills.  Respiratory: Negative for cough and shortness of breath.   Cardiovascular: Positive for chest pain.  Gastrointestinal: Positive for nausea, vomiting and abdominal pain. Negative for abdominal distention.  Genitourinary: Negative for dysuria and flank pain.  Musculoskeletal: Negative for back pain.  Skin: Negative for color change and rash.  Neurological: Negative for light-headedness and headaches.  All other systems reviewed and are negative.    Allergies  Ezetimibe-simvastatin  Home Medications   Current Outpatient Rx  Name Route Sig Dispense Refill  . ASPIRIN 81 MG PO TBEC Oral Take 81 mg by mouth daily.      Marland Kitchen COENZYME Q10 100 MG PO CAPS Oral Take 100 mg by mouth daily.      Marland Kitchen  CRESTOR 10 MG PO TABS  take 1 tablet by mouth once daily 30 tablet 4  . CYANOCOBALAMIN 500 MCG PO TBDP Oral Take 500 mcg by mouth daily.      . ERGOCALCIFEROL 50000 UNITS PO CAPS Oral Take 50,000 Units by mouth every Monday.    . IBUPROFEN 200 MG PO TABS Oral Take 400 mg by mouth every 6 (six) hours as needed. For pain.    Marland Kitchen LORAZEPAM 1 MG PO TABS Oral Take 1 mg by mouth daily as needed. For anxiety.    . MELOXICAM 15 MG PO TABS Oral Take 15 mg by mouth daily.    Marland Kitchen PANTOPRAZOLE SODIUM 40 MG PO TBEC   TAKE 1 TABLET BY MOUTH ONCE DAILY 30 tablet 4  . TADALAFIL 20 MG PO TABS Oral Take 20 mg by mouth daily as needed. For erectile dysfunction.    . TESTOSTERONE 20.25 MG/ACT (1.62%) TD GEL      . TETRAHYDROZOLINE HCL 0.05 % OP SOLN Both Eyes Place 1 drop into both eyes 4 (four) times daily as needed. For eye irritation.    Marland Kitchen UREA 40 % EX CREA Topical Apply 1 application topically daily as needed. For warts on feet.    Marland Kitchen VALACYCLOVIR HCL 1 G PO TABS Oral Take 500 mg by mouth daily.       BP 139/74  Pulse 80  Temp(Src) 97.7 F (36.5 C) (Oral)  Resp 20  Ht 6\' 1"  (1.854 m)  Wt 205 lb (92.987 kg)  BMI 27.05 kg/m2  SpO2 100%  Physical Exam  Nursing note and vitals reviewed. Constitutional: He appears well-developed and well-nourished. No distress.  HENT:  Head: Normocephalic.  Eyes: Pupils are equal, round, and reactive to light.  Neck: Neck supple.  Cardiovascular: Normal rate, regular rhythm, normal heart sounds and normal pulses.   Occasional extrasystoles are present.  Pulmonary/Chest: Effort normal. No respiratory distress. He has no wheezes.  Abdominal: Soft. He exhibits no distension. There is no tenderness. There is no rebound and no guarding.  Neurological: He is alert.  Skin: Skin is warm and dry. He is not diaphoretic.    ED Course  Procedures (including critical care time)  Labs Reviewed  GLUCOSE, CAPILLARY - Abnormal; Notable for the following:    Glucose-Capillary 112 (*)    All other components within normal limits  BASIC METABOLIC PANEL - Abnormal; Notable for the following:    Glucose, Bld 108 (*)    GFR calc non Af Amer 62 (*)    GFR calc Af Amer 72 (*)    All other components within normal limits  CBC  TROPONIN I   No results found.   1. Epigastric pain   2. Nausea and vomiting   3. Premature complex, ventricular     RA sat is normal at 96%.    EKG at time 09:41, sinus rhythm at 93, multiple PVC's, normal axis, normal intervals, no ST or T wave  abn's.    11:21 AM Spoke to Dr. Donnie Aho who agrees pt should remain for at least 2nd set of makers.  I have discussed at length with patient pros and cons, he understands that cannot truly rule otu MI without serial markers and all of the implications related to MI.  Pt feels resolved, he feels comfortable going home, knows to return if worse, will contact PCP and./or Hochrein this week for follow up.   MDM  Pt with sharp CP's associated with vomiting, diaphoresis with exercise.  However,  he also had taken an abx pill, doxycycline on an empty stomach.  He did have some epigastric pain prior to the CP.  All symptoms seemed resolved after vomiting and with rest.  He feels back to baseline.  I am reasurred with a non ischemic ECG, resolution of symptoms quickly and the fact that he exercised for 40 minutes 2 days ago with no CP, abn diaphoresis, SOB.  He would rather not stay until 1 PM when cardiac markers should elevate even after I explained that tru myocardial damage cannot be determined till then.  He understands risks.  He agrees to stay until I speak to his cardiologist, and he agrees to follow up with cardiologist.          Gavin Pound. Oletta Lamas, MD 03/08/12 1121  Gavin Pound. Matilda Fleig, MD 03/08/12 1121

## 2012-03-27 ENCOUNTER — Ambulatory Visit: Payer: BC Managed Care – PPO | Admitting: Internal Medicine

## 2012-04-01 ENCOUNTER — Other Ambulatory Visit: Payer: Self-pay | Admitting: Internal Medicine

## 2012-04-08 ENCOUNTER — Ambulatory Visit (HOSPITAL_BASED_OUTPATIENT_CLINIC_OR_DEPARTMENT_OTHER): Payer: BC Managed Care – PPO | Attending: Internal Medicine | Admitting: Radiology

## 2012-04-08 VITALS — Ht 73.0 in | Wt 204.0 lb

## 2012-04-08 DIAGNOSIS — G4733 Obstructive sleep apnea (adult) (pediatric): Secondary | ICD-10-CM

## 2012-04-08 DIAGNOSIS — G4730 Sleep apnea, unspecified: Secondary | ICD-10-CM

## 2012-04-08 DIAGNOSIS — G4710 Hypersomnia, unspecified: Secondary | ICD-10-CM | POA: Insufficient documentation

## 2012-04-17 DIAGNOSIS — I4949 Other premature depolarization: Secondary | ICD-10-CM

## 2012-04-17 DIAGNOSIS — R0609 Other forms of dyspnea: Secondary | ICD-10-CM

## 2012-04-17 DIAGNOSIS — G4730 Sleep apnea, unspecified: Secondary | ICD-10-CM

## 2012-04-17 DIAGNOSIS — G4710 Hypersomnia, unspecified: Secondary | ICD-10-CM

## 2012-04-17 DIAGNOSIS — R0989 Other specified symptoms and signs involving the circulatory and respiratory systems: Secondary | ICD-10-CM

## 2012-04-17 NOTE — Procedures (Signed)
NAMEALVER, LEETE NO.:  [id number]  MEDICAL RECORD NO.:  [id number]          PATIENT TYPE:  OUT  LOCATION:  SLEEP CENTER                 FACILITY[facility]  [facility]  PHYSICIAN:  Barbaraann Share, MD,FCCPDATE OF BIRTH:  25-Jan-1951  DATE OF STUDY:  04/08/2012                           NOCTURNAL POLYSOMNOGRAM  REFERRING PHYSICIAN:  Stacie Glaze, MD  REFERRING PHYSICIAN:  Stacie Glaze, MD.  INDICATION FOR STUDY:  Hypersomnia with sleep apnea.  EPWORTH SLEEPINESS SCORE:  Ten.  MEDICATIONS:  SLEEP ARCHITECTURE:  The patient had total sleep time of 314 minutes with no slow wave sleep, but did have 61 minutes of REM.  Sleep onset latency was normal at 7.5 minutes, and REM onset was prolonged at 179 minutes.  Sleep efficiency was moderately reduced at 77%.  RESPIRATORY DATA:  The patient was found to have 19 apneas and 3 obstructive hypopneas, giving him an apnea-hypopnea index of only 4 events per hour.  The events occurred in all body positions, and there was mild snoring noted throughout.  The patient did not meet split night protocol secondary to his small numbers of events.  OXYGEN DATA:  There was O2 desaturation transiently as low as 88% with the patient's obstructive events.  CARDIAC DATA:  Rare PVC noted, but no clinically significant arrhythmias were seen.  MOVEMENT-PARASOMNIA:  The patient had no significant leg jerks or other abnormal behavior seen.  IMPRESSIONS-RECOMMENDATIONS: 1. Small numbers of obstructive events, which do not meet the apnea-     hypopnea index criteria for the obstructive sleep apnea syndrome.     The patient did have mild snoring noted. 2. Rare premature ventricular contraction, but no clinically     significant arrhythmias were seen.    Barbaraann Share, MD,FCCP Diplomate, American Board of Sleep Medicine   [facility]  D:  04/17/2012 08:36:08  T:  04/17/2012 23:16:42  Job:  [id number]

## 2012-04-18 ENCOUNTER — Other Ambulatory Visit: Payer: Self-pay | Admitting: Internal Medicine

## 2012-04-22 ENCOUNTER — Encounter: Payer: Self-pay | Admitting: Internal Medicine

## 2012-04-22 ENCOUNTER — Ambulatory Visit (INDEPENDENT_AMBULATORY_CARE_PROVIDER_SITE_OTHER): Payer: BC Managed Care – PPO | Admitting: Internal Medicine

## 2012-04-22 VITALS — BP 140/80 | HR 72 | Temp 98.1°F | Resp 16 | Ht 73.0 in | Wt 205.0 lb

## 2012-04-22 DIAGNOSIS — R002 Palpitations: Secondary | ICD-10-CM

## 2012-04-22 DIAGNOSIS — I10 Essential (primary) hypertension: Secondary | ICD-10-CM

## 2012-04-22 NOTE — Patient Instructions (Signed)
The patient is instructed to continue all medications as prescribed. Schedule followup with check out clerk upon leaving the clinic  

## 2012-04-22 NOTE — Progress Notes (Signed)
Subjective:    Patient ID: Jonathan Young, male    DOB: 04-13-1951, 61 y.o.   MRN: [id number]  HPI Jonathan Young is a 61 year old male who has hyperlipidemia and osteoarthritis status post joint replacements (hip) he also has hypogonadism on testosterone replacement and a history of B12 deficiency. He presents today for followup visit discussing his need for placement of possibly the other hip and also has generalized osteoarthritis.  We also discussed his testosterone replacement and monitoring of his testosterone.       Review of Systems  Constitutional: Positive for fatigue. Negative for fever.  HENT: Negative for hearing loss, congestion, neck pain and postnasal drip.   Eyes: Negative for discharge, redness and visual disturbance.  Respiratory: Negative for cough, shortness of breath and wheezing.   Cardiovascular: Negative for leg swelling.  Gastrointestinal: Negative for abdominal pain, constipation and abdominal distention.  Genitourinary: Positive for frequency. Negative for urgency.  Musculoskeletal: Negative for joint swelling and arthralgias.  Skin: Negative for color change and rash.  Neurological: Negative for weakness and light-headedness.  Hematological: Negative for adenopathy.  Psychiatric/Behavioral: Negative for behavioral problems.   Past Medical History  Diagnosis Date  . Hypercholesteremia   . Osteoarthritis   . Dysrhythmia     PVC'S  . GERD (gastroesophageal reflux disease)   . Sleep apnea     STOP BANG SCORE 4    History   Social History  . Marital Status: Single    Spouse Name: N/A    Number of Children: N/A  . Years of Education: N/A   Occupational History  . Not on file.   Social History Main Topics  . Smoking status: Former Smoker    Quit date: 05/27/1981  . Smokeless tobacco: Never Used  . Alcohol Use: 7.0 oz/week    14 drink(s) per week  . Drug Use: No  . Sexually Active: Not on file   Other Topics Concern  . Not on file    Social History Narrative  . No narrative on file    Past Surgical History  Procedure Date  . Total hip arthroplasty     RT TOAL HIP  . Hemorrhoid surgery 1982  . Tonsillectomy and adenoidectomy   . Shoulder surgery     BIL  . Total hip arthroplasty 06/01/2012    Procedure: TOTAL HIP ARTHROPLASTY ANTERIOR APPROACH;  Surgeon: Shelda Pal, MD;  Location: [facility]S;  Service: Orthopedics;  Laterality: Left;    Family History  Problem Relation Age of Onset  . Emphysema Mother     Allergies  Allergen Reactions  . Ezetimibe-Simvastatin Other (See Comments)    "Thought I was loosing my mind"    Current Outpatient Prescriptions on File Prior to Visit  Medication Sig Dispense Refill  . Coenzyme Q10 (COQ-10) 100 MG capsule Take 100 mg by mouth daily.        . Cyanocobalamin (B-12 DOTS) 500 MCG TBDP Take 500 mcg by mouth daily.        . tadalafil (CIALIS) 20 MG tablet Take 20 mg by mouth daily as needed. For erectile dysfunction.      . urea (CARMOL) 40 % CREA Apply 1 application topically daily as needed. For warts on feet.      . valACYclovir (VALTREX) 1000 MG tablet Take 500 mg by mouth daily.         BP 140/80  Pulse 72  Temp 98.1 F (36.7 C)  Resp 16  Ht 6\' 1"  (1.854 m)  Wt 205 lb (92.987 kg)  BMI 27.05 kg/m2       Objective:   Physical Exam  Nursing note and vitals reviewed. Constitutional: He appears well-developed and well-nourished.  HENT:  Head: Normocephalic and atraumatic.  Eyes: Conjunctivae normal are normal. Pupils are equal, round, and reactive to light.  Neck: Normal range of motion. Neck supple.  Cardiovascular: Normal rate and regular rhythm.   Pulmonary/Chest: Effort normal and breath sounds normal.  Abdominal: Soft. Bowel sounds are normal.          Assessment & Plan:  Patient has severe degenerative joint disease and is scheduled to undergo hip arthroplasty.  He has been screened and is appropriate for her surgery.  We discussed his  testosterone replacement we'll hold his testosterone replacement around surgery.  Appropriate monitoring post therapy scheduled. Discussion of anxiety and the role palpitations Monitoring for osteoporosis

## 2012-04-29 ENCOUNTER — Other Ambulatory Visit: Payer: Self-pay | Admitting: Internal Medicine

## 2012-05-22 ENCOUNTER — Encounter (HOSPITAL_COMMUNITY): Payer: Self-pay | Admitting: Pharmacy Technician

## 2012-05-27 ENCOUNTER — Encounter (HOSPITAL_COMMUNITY): Payer: Self-pay

## 2012-05-27 ENCOUNTER — Ambulatory Visit (HOSPITAL_COMMUNITY)
Admission: RE | Admit: 2012-05-27 | Discharge: 2012-05-27 | Disposition: A | Discharge status: Home / Self Care | Payer: BC Managed Care – PPO | Source: Ambulatory Visit | Attending: Orthopedic Surgery | Admitting: Orthopedic Surgery

## 2012-05-27 ENCOUNTER — Encounter (HOSPITAL_COMMUNITY)
Admission: RE | Admit: 2012-05-27 | Discharge: 2012-05-27 | Disposition: A | Discharge status: Home / Self Care | Payer: BC Managed Care – PPO | Source: Ambulatory Visit | Attending: Orthopedic Surgery | Admitting: Orthopedic Surgery

## 2012-05-27 DIAGNOSIS — Z01818 Encounter for other preprocedural examination: Secondary | ICD-10-CM | POA: Insufficient documentation

## 2012-05-27 DIAGNOSIS — F17200 Nicotine dependence, unspecified, uncomplicated: Secondary | ICD-10-CM | POA: Insufficient documentation

## 2012-05-27 DIAGNOSIS — J8410 Pulmonary fibrosis, unspecified: Secondary | ICD-10-CM | POA: Insufficient documentation

## 2012-05-27 DIAGNOSIS — I499 Cardiac arrhythmia, unspecified: Secondary | ICD-10-CM | POA: Insufficient documentation

## 2012-05-27 HISTORY — DX: Gastro-esophageal reflux disease without esophagitis: K21.9

## 2012-05-27 HISTORY — DX: Cardiac arrhythmia, unspecified: I49.9

## 2012-05-27 LAB — CBC
HCT: 45.9 % (ref 39.0–52.0)
MCH: 30.9 pg (ref 26.0–34.0)
MCHC: 33.3 g/dL (ref 30.0–36.0)
MCV: 92.7 fL (ref 78.0–100.0)
Platelets: 233 10*3/uL (ref 150–400)
RDW: 12.4 % (ref 11.5–15.5)
WBC: 5.5 10*3/uL (ref 4.0–10.5)

## 2012-05-27 LAB — DIFFERENTIAL
Basophils Absolute: 0 10*3/uL (ref 0.0–0.1)
Basophils Relative: 1 % (ref 0–1)
Eosinophils Absolute: 0.3 10*3/uL (ref 0.0–0.7)
Eosinophils Relative: 6 % — ABNORMAL HIGH (ref 0–5)
Monocytes Absolute: 0.5 10*3/uL (ref 0.1–1.0)

## 2012-05-27 LAB — URINALYSIS, ROUTINE W REFLEX MICROSCOPIC
Bilirubin Urine: NEGATIVE
Glucose, UA: NEGATIVE mg/dL
Hgb urine dipstick: NEGATIVE
Protein, ur: NEGATIVE mg/dL
Urobilinogen, UA: 0.2 mg/dL (ref 0.0–1.0)

## 2012-05-27 LAB — BASIC METABOLIC PANEL
BUN: 22 mg/dL (ref 6–23)
CO2: 26 mEq/L (ref 19–32)
Calcium: 9.6 mg/dL (ref 8.4–10.5)
Creatinine, Ser: 1.16 mg/dL (ref 0.50–1.35)

## 2012-05-27 LAB — PROTIME-INR: Prothrombin Time: 12.8 seconds (ref 11.6–15.2)

## 2012-05-27 LAB — APTT: aPTT: 32 seconds (ref 24–37)

## 2012-05-27 MED ORDER — CHLORHEXIDINE GLUCONATE 4 % EX LIQD
60.0000 mL | Freq: Once | CUTANEOUS | Status: DC
  Filled 2012-05-27: qty 60

## 2012-05-27 NOTE — Progress Notes (Signed)
H&P dictated 05/27/12 Dictation # [id number]

## 2012-05-27 NOTE — Progress Notes (Signed)
05/27/12 1235  OBSTRUCTIVE SLEEP APNEA  Have you ever been diagnosed with sleep apnea through a sleep study? No  Do you snore loudly (loud enough to be heard through closed doors)?  1  Do you often feel tired, fatigued, or sleepy during the daytime? 1  Has anyone observed you stop breathing during your sleep? 0  Do you have, or are you being treated for high blood pressure? 0  BMI more than 35 kg/m2? 0  Age over 61 years old? 1  Neck circumference greater than 40 cm/18 inches? 0  Gender: 1  Obstructive Sleep Apnea Score 4   Score 4 or greater  Updated health history;Results sent to PCP

## 2012-05-27 NOTE — Patient Instructions (Signed)
20 Jonathan Young  05/27/2012   Your procedure is scheduled on:  06/01/12  Report to SHORT STAY DEPT  at 7:00 AM.  Call this number if you have problems the morning of surgery: [phone]   Remember:   Do not eat food or drink liquids AFTER MIDNIGHT    Take these medicines the morning of surgery with A SIP OF WATER: PROTONIX / CRESTOR / VALTREX   Do not wear jewelry, make-up or nail polish.  Do not wear lotions, powders, or perfumes.   Do not shave legs or underarms 48 hrs. before surgery (men may shave face)  Do not bring valuables to the hospital.  Contacts, dentures or bridgework may not be worn into surgery.  Leave suitcase in the car. After surgery it may be brought to your room.  For patients admitted to the hospital, checkout time is 11:00 AM the day of discharge.   Patients discharged the day of surgery will not be allowed to drive home.    Special Instructions:   Please read over the following fact sheets that you were given: MRSA  Information /  Incentive Spirometer               SHOWER WITH BETASEPT THE NIGHT BEFORE SURGERY AND THE MORNING OF SURGERY

## 2012-05-28 NOTE — H&P (Signed)
NAME:  Jonathan Young, Jonathan Young                ACCOUNT NO.:  [id number]  MEDICAL RECORD NO.:  [id number]  LOCATION:  [facility]                         FACILITY:  [facility]  PHYSICIAN:  Axle Parfait J. Emmi Wertheim, P.A.DATE OF BIRTH:  04/11/1951  DATE OF ADMISSION:  05/27/2012 DATE OF DISCHARGE:  05/27/2012                             HISTORY & PHYSICAL   DATE OF SURGERY:  June 01, 2012.  ADMITTING DIAGNOSIS:  Avascular necrosis, left hip.  HISTORY OF PRESENT ILLNESS:  This is a 61-year-old gentleman with a history of avascular necrosis of his left hip that has failed conservative treatment.  At this time, the patient is now scheduled for total hip arthroplasty by anterior approach.  The surgery risks, benefits, and aftercare were discussed in detail with the patient. Questions invited and answered.  Note that the patient is going home after surgery.  He is a candidate for trans-cinnamic acid and dexamethasone once a month.  He is given his home medications of aspirin, iron, Robaxin, MiraLAX, and Colace.  Note that he do not have his medical doctor at this time.  Not that his medical doctor is Dr. John Jenkins and he has received medical clearance there.  PAST MEDICAL HISTORY:  Drug allergies none.  CURRENT MEDICATIONS:  Crestor, Protonix, meloxicam, Valtrex, B12, CoQ- 10, and fish oil.  He did not bring the dosages today.  He will bring those to the hospital.  He has stopped his aspirin, meloxicam, fish oil, and vitamins.  PREVIOUS SURGERIES:  Right hip total hip arthroplasty, bilateral shoulder scopes, tonsillectomy, and hemorrhoidectomy.  SERIOUS MEDICAL ILLNESSES:  Hyperlipidemia, reflux, and herpetic infection.  FAMILY HISTORY:  Negative.  SOCIAL HISTORY:  The patient is single.  He does not smoke.  He has 2 to 3 drinks per night and again he plans on going home after surgery.  REVIEW OF SYSTEMS:  CENTRAL NERVOUS SYSTEM:  Negative for headache, blurred vision, or dizziness.  PULMONARY:  Negative  shortness of breath, PND, and orthopnea.  CARDIOVASCULAR:  Positive for longstanding occasional irregular heartbeat that has been worked up and is benign. GI:  Positive for reflux.  Negative for ulcers, hepatitis.  GU: Negative for urinary tract difficulty.  MUSCULOSKELETAL:  Positive in HPI.  PHYSICAL EXAMINATION:  VITAL SIGNS:  BP 143/90, pulse 69 with occasional extrasystole, respirations 14. HEENT:  Head normocephalic.  Nose patent.  Ears patent.  Pupils equal, round, reactive to light.  Throat without injection. NECK:  Supple without adenopathy.  Carotids 2+ without bruits. CHEST:  Clear to auscultation.  No rales or rhonchi.  Respirations 14. HEART:  Regular rate and rhythm with occasional extrasystole at 69 beats per minute without murmur. ABDOMEN:  Soft.  Active bowel sounds.  No masses or organomegaly. NEUROLOGIC:  Patient is alert and oriented to time place, person. Cranial nerves II-XII grossly intact. EXTREMITIES:  Shows the right hip status post total hip arthroplasty by posterior approach, left hip shows full extension, flexion to 95 degrees, external rotation of 25, internal rotation to neutral. Sensation and circulation are intact.  ASSESSMENT:  Avascular necrosis of left hip.  PLAN:  Anterior total hip arthroplasty, left hip.     Danyeal Akens J. Oasis Goehring, P.A.       SJC/MEDQ  D:  05/27/2012  T:  05/28/2012  Job:  [id number] 

## 2012-06-01 ENCOUNTER — Ambulatory Visit (HOSPITAL_COMMUNITY): Payer: BC Managed Care – PPO

## 2012-06-01 ENCOUNTER — Encounter (HOSPITAL_COMMUNITY): Payer: Self-pay | Admitting: Anesthesiology

## 2012-06-01 ENCOUNTER — Ambulatory Visit (HOSPITAL_COMMUNITY): Payer: BC Managed Care – PPO | Admitting: Anesthesiology

## 2012-06-01 ENCOUNTER — Encounter (HOSPITAL_COMMUNITY)
Admission: RE | Disposition: A | Discharge status: Home Health | Payer: Self-pay | Source: Ambulatory Visit | Attending: Orthopedic Surgery

## 2012-06-01 ENCOUNTER — Encounter (HOSPITAL_COMMUNITY): Payer: Self-pay | Admitting: *Deleted

## 2012-06-01 ENCOUNTER — Inpatient Hospital Stay (HOSPITAL_COMMUNITY)
Admission: RE | Admit: 2012-06-01 | Discharge: 2012-06-03 | DRG: 818 | Disposition: A | Discharge status: Home Health | Payer: BC Managed Care – PPO | Source: Ambulatory Visit | Attending: Orthopedic Surgery | Admitting: Orthopedic Surgery

## 2012-06-01 DIAGNOSIS — Z96649 Presence of unspecified artificial hip joint: Secondary | ICD-10-CM

## 2012-06-01 DIAGNOSIS — E785 Hyperlipidemia, unspecified: Secondary | ICD-10-CM | POA: Diagnosis present

## 2012-06-01 DIAGNOSIS — K219 Gastro-esophageal reflux disease without esophagitis: Secondary | ICD-10-CM | POA: Diagnosis present

## 2012-06-01 DIAGNOSIS — M87059 Idiopathic aseptic necrosis of unspecified femur: Principal | ICD-10-CM | POA: Diagnosis present

## 2012-06-01 HISTORY — PX: TOTAL HIP ARTHROPLASTY: SHX124

## 2012-06-01 LAB — TYPE AND SCREEN: ABO/RH(D): O POS

## 2012-06-01 SURGERY — ARTHROPLASTY, HIP, TOTAL, ANTERIOR APPROACH
Anesthesia: General | Site: Hip | Laterality: Left | Wound class: Clean

## 2012-06-01 MED ORDER — CEFAZOLIN SODIUM 1-5 GM-% IV SOLN
INTRAVENOUS | Status: DC | PRN
  Administered 2012-06-01: 2 g via INTRAVENOUS

## 2012-06-01 MED ORDER — VALACYCLOVIR HCL 500 MG PO TABS
500.0000 mg | ORAL_TABLET | Freq: Every day | ORAL | Status: DC
  Administered 2012-06-02 – 2012-06-03 (×2): 500 mg via ORAL
  Filled 2012-06-01 (×2): qty 1

## 2012-06-01 MED ORDER — FERROUS SULFATE 325 (65 FE) MG PO TABS
325.0000 mg | ORAL_TABLET | Freq: Three times a day (TID) | ORAL | Status: DC
  Administered 2012-06-01 – 2012-06-03 (×5): 325 mg via ORAL
  Filled 2012-06-01 (×8): qty 1

## 2012-06-01 MED ORDER — VALACYCLOVIR HCL 500 MG PO TABS: 500.0000 mg | ORAL_TABLET | Freq: Every day | ORAL | Status: DC

## 2012-06-01 MED ORDER — ROCURONIUM BROMIDE 100 MG/10ML IV SOLN
INTRAVENOUS | Status: DC | PRN
  Administered 2012-06-01: 60 mg via INTRAVENOUS

## 2012-06-01 MED ORDER — HYDROCODONE-ACETAMINOPHEN 7.5-325 MG PO TABS
1.0000 | ORAL_TABLET | ORAL | Status: DC
  Administered 2012-06-01: 1 via ORAL
  Administered 2012-06-01 (×2): 2 via ORAL
  Administered 2012-06-02 (×6): 1 via ORAL
  Administered 2012-06-03: 2 via ORAL
  Administered 2012-06-03: 1 via ORAL
  Filled 2012-06-01: qty 1
  Filled 2012-06-01: qty 2
  Filled 2012-06-01 (×7): qty 1
  Filled 2012-06-01 (×2): qty 2

## 2012-06-01 MED ORDER — LACTATED RINGERS IV SOLN
INTRAVENOUS | Status: DC | PRN
  Administered 2012-06-01 (×2): via INTRAVENOUS

## 2012-06-01 MED ORDER — NAPHAZOLINE HCL 0.1 % OP SOLN: 1.0000 [drp] | Freq: Four times a day (QID) | OPHTHALMIC | Status: DC | PRN

## 2012-06-01 MED ORDER — ONDANSETRON HCL 4 MG/2ML IJ SOLN: 4.0000 mg | Freq: Four times a day (QID) | INTRAMUSCULAR | Status: DC | PRN

## 2012-06-01 MED ORDER — HYDROMORPHONE HCL PF 1 MG/ML IJ SOLN
INTRAMUSCULAR | Status: AC
  Filled 2012-06-01: qty 1

## 2012-06-01 MED ORDER — ONDANSETRON HCL 4 MG/2ML IJ SOLN
INTRAMUSCULAR | Status: DC | PRN
  Administered 2012-06-01: 4 mg via INTRAVENOUS

## 2012-06-01 MED ORDER — PROPOFOL 10 MG/ML IV BOLUS
INTRAVENOUS | Status: DC | PRN
  Administered 2012-06-01: 200 mg via INTRAVENOUS

## 2012-06-01 MED ORDER — RIVAROXABAN 10 MG PO TABS
10.0000 mg | ORAL_TABLET | ORAL | Status: DC
  Administered 2012-06-02 – 2012-06-03 (×2): 10 mg via ORAL
  Filled 2012-06-01 (×3): qty 1

## 2012-06-01 MED ORDER — VANCOMYCIN HCL IN DEXTROSE 1-5 GM/200ML-% IV SOLN
INTRAVENOUS | Status: AC
  Filled 2012-06-01: qty 200

## 2012-06-01 MED ORDER — METOCLOPRAMIDE HCL 5 MG/ML IJ SOLN: 5.0000 mg | Freq: Three times a day (TID) | INTRAMUSCULAR | Status: DC | PRN

## 2012-06-01 MED ORDER — DEXAMETHASONE SODIUM PHOSPHATE 10 MG/ML IJ SOLN
10.0000 mg | Freq: Once | INTRAMUSCULAR | Status: AC
  Administered 2012-06-02: 10 mg via INTRAVENOUS
  Filled 2012-06-01: qty 1

## 2012-06-01 MED ORDER — ACETAMINOPHEN 10 MG/ML IV SOLN
INTRAVENOUS | Status: DC | PRN
  Administered 2012-06-01: 1000 mg via INTRAVENOUS

## 2012-06-01 MED ORDER — LACTATED RINGERS IV SOLN: INTRAVENOUS | Status: DC

## 2012-06-01 MED ORDER — ACETAMINOPHEN 10 MG/ML IV SOLN
INTRAVENOUS | Status: AC
  Filled 2012-06-01: qty 100

## 2012-06-01 MED ORDER — DEXAMETHASONE SODIUM PHOSPHATE 10 MG/ML IJ SOLN
10.0000 mg | Freq: Once | INTRAMUSCULAR | Status: AC
  Administered 2012-06-01: 10 mg via INTRAVENOUS
  Filled 2012-06-01: qty 1

## 2012-06-01 MED ORDER — METHOCARBAMOL 100 MG/ML IJ SOLN
500.0000 mg | Freq: Four times a day (QID) | INTRAVENOUS | Status: DC | PRN
  Administered 2012-06-01: 500 mg via INTRAVENOUS
  Filled 2012-06-01 (×2): qty 5

## 2012-06-01 MED ORDER — BISACODYL 5 MG PO TBEC: 5.0000 mg | DELAYED_RELEASE_TABLET | Freq: Every day | ORAL | Status: DC | PRN

## 2012-06-01 MED ORDER — POLYETHYLENE GLYCOL 3350 17 G PO PACK
17.0000 g | PACK | Freq: Two times a day (BID) | ORAL | Status: DC
  Administered 2012-06-01 – 2012-06-03 (×4): 17 g via ORAL

## 2012-06-01 MED ORDER — METOCLOPRAMIDE HCL 10 MG PO TABS: 5.0000 mg | ORAL_TABLET | Freq: Three times a day (TID) | ORAL | Status: DC | PRN

## 2012-06-01 MED ORDER — METHOCARBAMOL 500 MG PO TABS
500.0000 mg | ORAL_TABLET | Freq: Four times a day (QID) | ORAL | Status: DC | PRN
  Administered 2012-06-01 – 2012-06-03 (×6): 500 mg via ORAL
  Filled 2012-06-01 (×6): qty 1

## 2012-06-01 MED ORDER — PANTOPRAZOLE SODIUM 40 MG PO TBEC
40.0000 mg | DELAYED_RELEASE_TABLET | Freq: Every day | ORAL | Status: DC
  Administered 2012-06-02 – 2012-06-03 (×2): 40 mg via ORAL
  Filled 2012-06-01 (×2): qty 1

## 2012-06-01 MED ORDER — MENTHOL 3 MG MT LOZG
1.0000 | LOZENGE | OROMUCOSAL | Status: DC | PRN
  Administered 2012-06-01: 3 mg via ORAL
  Filled 2012-06-01: qty 9

## 2012-06-01 MED ORDER — ALUM & MAG HYDROXIDE-SIMETH 200-200-20 MG/5ML PO SUSP: 30.0000 mL | ORAL | Status: DC | PRN

## 2012-06-01 MED ORDER — HYDROMORPHONE HCL PF 1 MG/ML IJ SOLN
INTRAMUSCULAR | Status: DC | PRN
  Administered 2012-06-01: 1 mg via INTRAVENOUS
  Administered 2012-06-01: 0.5 mg via INTRAVENOUS
  Administered 2012-06-01: 1 mg via INTRAVENOUS
  Administered 2012-06-01: 0.5 mg via INTRAVENOUS
  Administered 2012-06-01: 1 mg via INTRAVENOUS

## 2012-06-01 MED ORDER — DOCUSATE SODIUM 100 MG PO CAPS
100.0000 mg | ORAL_CAPSULE | Freq: Two times a day (BID) | ORAL | Status: DC
  Administered 2012-06-01 – 2012-06-03 (×4): 100 mg via ORAL

## 2012-06-01 MED ORDER — HYDROMORPHONE HCL PF 1 MG/ML IJ SOLN
0.5000 mg | INTRAMUSCULAR | Status: DC | PRN
  Administered 2012-06-01: 1 mg via INTRAVENOUS
  Filled 2012-06-01: qty 1

## 2012-06-01 MED ORDER — LIDOCAINE HCL (CARDIAC) 20 MG/ML IV SOLN
INTRAVENOUS | Status: DC | PRN
  Administered 2012-06-01: 75 mg via INTRAVENOUS

## 2012-06-01 MED ORDER — 0.9 % SODIUM CHLORIDE (POUR BTL) OPTIME
TOPICAL | Status: DC | PRN
  Administered 2012-06-01: 1000 mL

## 2012-06-01 MED ORDER — ONDANSETRON HCL 4 MG PO TABS: 4.0000 mg | ORAL_TABLET | Freq: Four times a day (QID) | ORAL | Status: DC | PRN

## 2012-06-01 MED ORDER — DIPHENHYDRAMINE HCL 25 MG PO CAPS: 25.0000 mg | ORAL_CAPSULE | Freq: Four times a day (QID) | ORAL | Status: DC | PRN

## 2012-06-01 MED ORDER — PHENOL 1.4 % MT LIQD
1.0000 | OROMUCOSAL | Status: DC | PRN
Start: 2012-06-01 — End: 2012-06-03

## 2012-06-01 MED ORDER — FLEET ENEMA 7-19 GM/118ML RE ENEM: 1.0000 | ENEMA | Freq: Once | RECTAL | Status: AC | PRN

## 2012-06-01 MED ORDER — MIDAZOLAM HCL 5 MG/5ML IJ SOLN
INTRAMUSCULAR | Status: DC | PRN
  Administered 2012-06-01: 2 mg via INTRAVENOUS

## 2012-06-01 MED ORDER — PROMETHAZINE HCL 25 MG/ML IJ SOLN: 6.2500 mg | INTRAMUSCULAR | Status: DC | PRN

## 2012-06-01 MED ORDER — ZOLPIDEM TARTRATE 5 MG PO TABS: 5.0000 mg | ORAL_TABLET | Freq: Every evening | ORAL | Status: DC | PRN

## 2012-06-01 MED ORDER — NEOSTIGMINE METHYLSULFATE 1 MG/ML IJ SOLN
INTRAMUSCULAR | Status: DC | PRN
  Administered 2012-06-01: 3 mg via INTRAVENOUS

## 2012-06-01 MED ORDER — TRANEXAMIC ACID 100 MG/ML IV SOLN
1390.0000 mg | Freq: Once | INTRAVENOUS | Status: AC
  Administered 2012-06-01: 1390 mg via INTRAVENOUS
  Filled 2012-06-01: qty 13.9

## 2012-06-01 MED ORDER — FENTANYL CITRATE 0.05 MG/ML IJ SOLN
INTRAMUSCULAR | Status: DC | PRN
  Administered 2012-06-01 (×3): 100 ug via INTRAVENOUS
  Administered 2012-06-01: 50 ug via INTRAVENOUS
  Administered 2012-06-01: 100 ug via INTRAVENOUS
  Administered 2012-06-01: 50 ug via INTRAVENOUS

## 2012-06-01 MED ORDER — VANCOMYCIN HCL IN DEXTROSE 1-5 GM/200ML-% IV SOLN
1000.0000 mg | INTRAVENOUS | Status: AC
  Administered 2012-06-01: 1000 mg via INTRAVENOUS

## 2012-06-01 MED ORDER — CEFAZOLIN SODIUM-DEXTROSE 2-3 GM-% IV SOLR
INTRAVENOUS | Status: AC
  Filled 2012-06-01: qty 50

## 2012-06-01 MED ORDER — CEFAZOLIN SODIUM-DEXTROSE 2-3 GM-% IV SOLR
2.0000 g | Freq: Four times a day (QID) | INTRAVENOUS | Status: AC
  Administered 2012-06-01 – 2012-06-02 (×3): 2 g via INTRAVENOUS
  Filled 2012-06-01 (×3): qty 50

## 2012-06-01 MED ORDER — GLYCOPYRROLATE 0.2 MG/ML IJ SOLN
INTRAMUSCULAR | Status: DC | PRN
  Administered 2012-06-01: 0.4 mg via INTRAVENOUS

## 2012-06-01 MED ORDER — SODIUM CHLORIDE 0.9 % IV SOLN
100.0000 mL/h | INTRAVENOUS | Status: AC
  Administered 2012-06-01 – 2012-06-02 (×2): 100 mL/h via INTRAVENOUS
  Filled 2012-06-01 (×4): qty 1000

## 2012-06-01 MED ORDER — HYDROMORPHONE HCL PF 1 MG/ML IJ SOLN
0.2500 mg | INTRAMUSCULAR | Status: DC | PRN
  Administered 2012-06-01 (×4): 0.5 mg via INTRAVENOUS

## 2012-06-01 SURGICAL SUPPLY — 41 items
BAG ZIPLOCK 12X15 (MISCELLANEOUS) ×4 IMPLANT
BLADE SAW SGTL 18X1.27X75 (BLADE) ×2 IMPLANT
CELLS DAT CNTRL 66122 CELL SVR (MISCELLANEOUS) ×1 IMPLANT
CLOTH BEACON ORANGE TIMEOUT ST (SAFETY) ×2 IMPLANT
DERMABOND ADVANCED (GAUZE/BANDAGES/DRESSINGS) ×1
DERMABOND ADVANCED .7 DNX12 (GAUZE/BANDAGES/DRESSINGS) ×1 IMPLANT
DRAPE C-ARM 42X72 X-RAY (DRAPES) ×2 IMPLANT
DRAPE STERI IOBAN 125X83 (DRAPES) ×2 IMPLANT
DRAPE U-SHAPE 47X51 STRL (DRAPES) ×6 IMPLANT
DRSG AQUACEL AG ADV 3.5X10 (GAUZE/BANDAGES/DRESSINGS) ×2 IMPLANT
DRSG TEGADERM 4X4.75 (GAUZE/BANDAGES/DRESSINGS) ×2 IMPLANT
DURAPREP 26ML APPLICATOR (WOUND CARE) ×2 IMPLANT
ELECT BLADE TIP CTD 4 INCH (ELECTRODE) ×2 IMPLANT
ELECT REM PT RETURN 9FT ADLT (ELECTROSURGICAL) ×2
ELECTRODE REM PT RTRN 9FT ADLT (ELECTROSURGICAL) ×1 IMPLANT
EVACUATOR 1/8 PVC DRAIN (DRAIN) IMPLANT
FACESHIELD LNG OPTICON STERILE (SAFETY) ×8 IMPLANT
GAUZE SPONGE 2X2 8PLY STRL LF (GAUZE/BANDAGES/DRESSINGS) ×1 IMPLANT
GLOVE BIOGEL PI IND STRL 7.5 (GLOVE) ×1 IMPLANT
GLOVE BIOGEL PI IND STRL 8 (GLOVE) ×1 IMPLANT
GLOVE BIOGEL PI INDICATOR 7.5 (GLOVE) ×1
GLOVE BIOGEL PI INDICATOR 8 (GLOVE) ×1
GLOVE ECLIPSE 8.0 STRL XLNG CF (GLOVE) ×2 IMPLANT
GLOVE ORTHO TXT STRL SZ7.5 (GLOVE) ×4 IMPLANT
GOWN BRE IMP PREV XXLGXLNG (GOWN DISPOSABLE) ×4 IMPLANT
GOWN STRL NON-REIN LRG LVL3 (GOWN DISPOSABLE) ×2 IMPLANT
KIT BASIN OR (CUSTOM PROCEDURE TRAY) ×2 IMPLANT
PACK TOTAL JOINT (CUSTOM PROCEDURE TRAY) ×2 IMPLANT
PADDING CAST COTTON 6X4 STRL (CAST SUPPLIES) ×2 IMPLANT
RTRCTR WOUND ALEXIS 18CM MED (MISCELLANEOUS) ×2
SPONGE GAUZE 2X2 STER 10/PKG (GAUZE/BANDAGES/DRESSINGS) ×1
SUCTION FRAZIER 12FR DISP (SUCTIONS) ×2 IMPLANT
SUT MNCRL AB 4-0 PS2 18 (SUTURE) ×2 IMPLANT
SUT VIC AB 1 CT1 27 (SUTURE) ×1
SUT VIC AB 1 CT1 27XBRD ANTBC (SUTURE) ×1 IMPLANT
SUT VIC AB 1 CT1 36 (SUTURE) ×8 IMPLANT
SUT VIC AB 2-0 CT1 27 (SUTURE) ×2
SUT VIC AB 2-0 CT1 TAPERPNT 27 (SUTURE) ×2 IMPLANT
SUT VLOC 180 0 24IN GS25 (SUTURE) ×2 IMPLANT
TOWEL OR 17X26 10 PK STRL BLUE (TOWEL DISPOSABLE) ×4 IMPLANT
TRAY FOLEY CATH 14FRSI W/METER (CATHETERS) ×2 IMPLANT

## 2012-06-01 NOTE — Op Note (Signed)
NAME:  Jonathan Young.: [id number]      MEDICAL RECORD NO.: [id number]      FACILITY:  [facility]      PHYSICIAN:  Durene Romans D  DATE OF BIRTH:  July 18, 1951     DATE OF PROCEDURE:  06/01/2012                                 OPERATIVE REPORT         PREOPERATIVE DIAGNOSIS: Left  hip osteoarthritis.      POSTOPERATIVE DIAGNOSIS:  Left hip osteoarthritis.      PROCEDURE:  Left total hip replacement through an anterior approach   utilizing DePuy THR system, component size 56mm pinnacle cup, a size 36+4 neutral   Altrex liner, a size 5Hi Tri Lock stem with a 36+1.5 delta ceramic   ball.      SURGEON:  Madlyn Frankel. Charlann Boxer, M.D.      ASSISTANT:  Lanney Gins, PA      ANESTHESIA:  General.      SPECIMENS:  None.      COMPLICATIONS:  None.      BLOOD LOSS:  400 cc     DRAINS:  One Hemovac.      INDICATION OF THE PROCEDURE:  Jonathan Young is a 61 y.o. male who had   presented to office for evaluation of left hip pain.  Radiographs revealed   progressive degenerative changes with bone-on-bone   articulation to the  hip joint.  The patient had painful limited range of   motion significantly affecting their overall quality of life.  The patient was failing to    respond to conservative measures, and at this point was ready   to proceed with more definitive measures.  The patient has noted progressive   degenerative changes in his hip, progressive problems and dysfunction   with regarding the hip prior to surgery.  Consent was obtained for   benefit of pain relief.  Specific risk of infection, DVT, component   failure, dislocation, need for revision surgery, as well discussion of   the anterior versus posterior approach were reviewed.  Consent was   obtained for benefit of anterior pain relief through an anterior   approach.      PROCEDURE IN DETAIL:  The patient was brought to operative theater.   Once adequate anesthesia,  preoperative antibiotics, 2gm Ancef administered.   The patient was positioned supine on the OSI Hanna table.  Once adequate   padding of boney process was carried out, we had predraped out the hip, and  used fluoroscopy to confirm orientation of the pelvis and position.      The left hip was then prepped and draped from proximal iliac crest to   mid thigh with shower curtain technique.      Time-out was performed identifying the patient, planned procedure, and   extremity.     An incision was then made 2 cm distal and lateral to the   anterior superior iliac spine extending over the orientation of the   tensor fascia lata muscle and sharp dissection was carried down to the   fascia of the muscle and protractor placed in the soft tissues.      The fascia was then incised.  The muscle belly was identified and swept  laterally and retractor placed along the superior neck.  Following   cauterization of the circumflex vessels and removing some pericapsular   fat, a second cobra retractor was placed on the inferior neck.  A third   retractor was placed on the anterior acetabulum after elevating the   anterior rectus.  A L-capsulotomy was along the line of the   superior neck to the trochanteric fossa, then extended proximally and   distally.  Tag sutures were placed and the retractors were then placed   intracapsular.  We then identified the trochanteric fossa and   orientation of my neck cut, confirmed this radiographically   and then made a neck osteotomy with the femur on traction.  The femoral   head was removed without difficulty or complication.  Traction was let   off and retractors were placed posterior and anterior around the   acetabulum.      The labrum and foveal tissue were debrided.  I began reaming with a 47mm   reamer and reamed up to 53mm reamer with good bony bed preparation and a 54   cup was chosen.  The final 54mm Pinnacle cup was then impacted under fluoroscopy  to  confirm the depth of penetration and orientation with respect to   abduction.  A screw was placed followed by the hole eliminator.  The final   36+4 Altrex liner was impacted with good visualized rim fit.  The cup was positioned anatomically within the acetabular portion of the pelvis.      At this point, the femur was rolled at 80 degrees.  Further capsule was   released off the inferior aspect of the femoral neck.  I then   released the superior capsule proximally.  The hook was placed laterally   along the femur and elevated manually and held in position with the bed   hook.  The leg was then extended and adducted with the leg rolled to 100   degrees of external rotation.  Once the proximal femur was fully   exposed, I used a box osteotome to set orientation.  I then began   broaching with the starting chili pepper broach and passed this by hand and then broached up to 5.  With the 5 broach in place I chose a high offset neck and did a trial reduction with a 36+1.5 ball.  The offset was appropriate, leg lengths   appeared to be equal, confirmed radiographically.   Given these findings, I went ahead and dislocated the hip, repositioned all   retractors and positioned the right hip in the extended and abducted position.  The final 5 Hi Tri Lock stem was   chosen and it was impacted down to the level of neck cut.  Based on this   and the trial reduction, a 36+1.5 delta ceramic ball was chosen and   impacted onto a clean and dry trunnion, and the hip was reduced.  The   hip had been irrigated throughout the case again at this point.  I did   reapproximate the superior capsular leaflet to the anterior leaflet   using #1 Vicryl, placed a medium Hemovac drain deep.  The fascia of the   tensor fascia lata muscle was then reapproximated using #1 Vicryl.  The   remaining wound was closed with 2-0 Vicryl and running 4-0 Monocryl.   The hip was cleaned, dried, and dressed sterilely using Dermabond and    Aquacel dressing.  Drain site dressed separately.  She was then brought   to recovery room in stable condition tolerating the procedure well.    Danae Orleans, PA-C was present for the entirety of the case involved from   preoperative positioning, perioperative retractor management, general   facilitation of the case, as well as primary wound closure as assistant.            Pietro Cassis Alvan Dame, M.D.            MDO/MEDQ  D:  10/15/2011  T:  10/15/2011  Job:  [id number]      Electronically Signed by Paralee Cancel M.D. on 10/21/2011 09:15:38 AM

## 2012-06-01 NOTE — Transfer of Care (Signed)
Immediate Anesthesia Transfer of Care Note  Patient: Jonathan Young  Procedure(s) Performed: Procedure(s) (LRB): TOTAL HIP ARTHROPLASTY ANTERIOR APPROACH (Left)  Patient Location: PACU  Anesthesia Type: General  Level of Consciousness: awake, alert , oriented and patient cooperative  Airway & Oxygen Therapy: Patient Spontanous Breathing and Patient connected to face mask oxygen  Post-op Assessment: Report given to PACU RN, Post -op Vital signs reviewed and stable and Patient moving all extremities  Post vital signs: Reviewed and stable  Complications: No apparent anesthesia complications

## 2012-06-01 NOTE — Interval H&P Note (Signed)
History and Physical Interval Note:  06/01/2012 7:35 AM  Jonathan Young  has presented today for surgery, with the diagnosis of Osteoarthritis of the Left Hip  The various methods of treatment have been discussed with the patient and family. After consideration of risks, benefits and other options for treatment, the patient has consented to  Procedure(s) (LRB):LEFT TOTAL HIP ARTHROPLASTY ANTERIOR APPROACH (Left) as a surgical intervention .  The patients' history has been reviewed, patient examined, no change in status, stable for surgery.  I have reviewed the patients' chart and labs.  Questions were answered to the patient's satisfaction.     Shelda Pal

## 2012-06-01 NOTE — H&P (View-Only) (Signed)
Jonathan Young, Jonathan Young                ACCOUNT NO.:  [id number]  MEDICAL RECORD NO.:  [id number]  LOCATION:  [facility]                         FACILITY:  [facility]  PHYSICIAN:  Jaquelyn Bitter. Margaree Sandhu, P.A.DATE OF BIRTH:  1951-05-24  DATE OF ADMISSION:  05/27/2012 DATE OF DISCHARGE:  05/27/2012                             HISTORY & PHYSICAL   DATE OF SURGERY:  June 01, 2012.  ADMITTING DIAGNOSIS:  Avascular necrosis, left hip.  HISTORY OF PRESENT ILLNESS:  This is a 61 year old gentleman with a history of avascular necrosis of his left hip that has failed conservative treatment.  At this time, the patient is now scheduled for total hip arthroplasty by anterior approach.  The surgery risks, benefits, and aftercare were discussed in detail with the patient. Questions invited and answered.  Note that the patient is going home after surgery.  He is a candidate for trans-cinnamic acid and dexamethasone once a month.  He is given his home medications of aspirin, iron, Robaxin, MiraLAX, and Colace.  Note that he do not have his medical doctor at this time.  Not that his medical doctor is Dr. Darryll Capers and he has received medical clearance there.  PAST MEDICAL HISTORY:  Drug allergies none.  CURRENT MEDICATIONS:  Crestor, Protonix, meloxicam, Valtrex, B12, CoQ- 10, and fish oil.  He did not bring the dosages today.  He will bring those to the hospital.  He has stopped his aspirin, meloxicam, fish oil, and vitamins.  PREVIOUS SURGERIES:  Right hip total hip arthroplasty, bilateral shoulder scopes, tonsillectomy, and hemorrhoidectomy.  SERIOUS MEDICAL ILLNESSES:  Hyperlipidemia, reflux, and herpetic infection.  FAMILY HISTORY:  Negative.  SOCIAL HISTORY:  The patient is single.  He does not smoke.  He has 2 to 3 drinks per night and again he plans on going home after surgery.  REVIEW OF SYSTEMS:  CENTRAL NERVOUS SYSTEM:  Negative for headache, blurred vision, or dizziness.  PULMONARY:  Negative  shortness of breath, PND, and orthopnea.  CARDIOVASCULAR:  Positive for longstanding occasional irregular heartbeat that has been worked up and is benign. GI:  Positive for reflux.  Negative for ulcers, hepatitis.  GU: Negative for urinary tract difficulty.  MUSCULOSKELETAL:  Positive in HPI.  PHYSICAL EXAMINATION:  VITAL SIGNS:  BP 143/90, pulse 69 with occasional extrasystole, respirations 14. HEENT:  Head normocephalic.  Nose patent.  Ears patent.  Pupils equal, round, reactive to light.  Throat without injection. NECK:  Supple without adenopathy.  Carotids 2+ without bruits. CHEST:  Clear to auscultation.  No rales or rhonchi.  Respirations 14. HEART:  Regular rate and rhythm with occasional extrasystole at 69 beats per minute without murmur. ABDOMEN:  Soft.  Active bowel sounds.  No masses or organomegaly. NEUROLOGIC:  Patient is alert and oriented to time place, person. Cranial nerves II-XII grossly intact. EXTREMITIES:  Shows the right hip status post total hip arthroplasty by posterior approach, left hip shows full extension, flexion to 95 degrees, external rotation of 25, internal rotation to neutral. Sensation and circulation are intact.  ASSESSMENT:  Avascular necrosis of left hip.  PLAN:  Anterior total hip arthroplasty, left hip.     Jaquelyn Bitter. Ansh Fauble, P.A.  SJC/MEDQ  D:  05/27/2012  T:  05/28/2012  Job:  [id number]

## 2012-06-01 NOTE — Plan of Care (Signed)
Problem: Consults Goal: Diagnosis- Total Joint Replacement Primary Total Hip     

## 2012-06-01 NOTE — Anesthesia Postprocedure Evaluation (Signed)
  Anesthesia Post-op Note  Patient: Jonathan Young  Procedure(s) Performed: Procedure(s) (LRB): TOTAL HIP ARTHROPLASTY ANTERIOR APPROACH (Left)  Patient Location: PACU  Anesthesia Type: General  Level of Consciousness: awake and alert   Airway and Oxygen Therapy: Patient Spontanous Breathing  Post-op Pain: mild  Post-op Assessment: Post-op Vital signs reviewed, Patient's Cardiovascular Status Stable, Respiratory Function Stable, Patent Airway and No signs of Nausea or vomiting  Post-op Vital Signs: stable  Complications: No apparent anesthesia complications

## 2012-06-01 NOTE — Anesthesia Preprocedure Evaluation (Addendum)
Anesthesia Evaluation  Patient identified by MRN, date of birth, ID band Patient awake    Reviewed: Allergy & Precautions, H&P , NPO status , Patient's Chart, lab work & pertinent test results  Airway Mallampati: II TM Distance: >3 FB Neck ROM: Full    Dental No notable dental hx. (+) Dental Advisory Given Braces:   Pulmonary sleep apnea ,  CXR: NAD. Denies OSA. Stopbang 4 breath sounds clear to auscultation  Pulmonary exam normal       Cardiovascular negative cardio ROS  + dysrhythmias Rhythm:Regular Rate:Normal  Occasional PVCs    Neuro/Psych negative neurological ROS  negative psych ROS   GI/Hepatic Neg liver ROS, GERD-  Medicated,  Endo/Other  negative endocrine ROS  Renal/GU negative Renal ROS  negative genitourinary   Musculoskeletal negative musculoskeletal ROS (+)   Abdominal   Peds negative pediatric ROS (+)  Hematology negative hematology ROS (+)   Anesthesia Other Findings   Reproductive/Obstetrics negative OB ROS                         Anesthesia Physical Anesthesia Plan  ASA: II  Anesthesia Plan: General   Post-op Pain Management:    Induction: Intravenous  Airway Management Planned: Oral ETT  Additional Equipment:   Intra-op Plan:   Post-operative Plan: Extubation in OR  Informed Consent: I have reviewed the patients History and Physical, chart, labs and discussed the procedure including the risks, benefits and alternatives for the proposed anesthesia with the patient or authorized representative who has indicated his/her understanding and acceptance.   Dental advisory given  Plan Discussed with: CRNA  Anesthesia Plan Comments:         Anesthesia Quick Evaluation

## 2012-06-02 ENCOUNTER — Encounter (HOSPITAL_COMMUNITY): Payer: Self-pay | Admitting: Orthopedic Surgery

## 2012-06-02 LAB — BASIC METABOLIC PANEL
CO2: 27 mEq/L (ref 19–32)
Chloride: 101 mEq/L (ref 96–112)
Sodium: 136 mEq/L (ref 135–145)

## 2012-06-02 LAB — CBC
Platelets: 249 10*3/uL (ref 150–400)
RBC: 4.04 MIL/uL — ABNORMAL LOW (ref 4.22–5.81)
WBC: 11 10*3/uL — ABNORMAL HIGH (ref 4.0–10.5)

## 2012-06-02 MED ORDER — ASPIRIN EC 325 MG PO TBEC: 325.0000 mg | DELAYED_RELEASE_TABLET | Freq: Two times a day (BID) | ORAL | Status: AC

## 2012-06-02 MED ORDER — POLYETHYLENE GLYCOL 3350 17 G PO PACK: 17.0000 g | PACK | Freq: Two times a day (BID) | ORAL | Status: AC

## 2012-06-02 MED ORDER — METHOCARBAMOL 500 MG PO TABS: 500.0000 mg | ORAL_TABLET | Freq: Four times a day (QID) | ORAL | Status: AC | PRN

## 2012-06-02 MED ORDER — DSS 100 MG PO CAPS: 100.0000 mg | ORAL_CAPSULE | Freq: Two times a day (BID) | ORAL | Status: AC

## 2012-06-02 MED ORDER — DIPHENHYDRAMINE HCL 25 MG PO CAPS: 25.0000 mg | ORAL_CAPSULE | Freq: Four times a day (QID) | ORAL | Status: DC | PRN

## 2012-06-02 MED ORDER — FERROUS SULFATE 325 (65 FE) MG PO TABS: 325.0000 mg | ORAL_TABLET | Freq: Three times a day (TID) | ORAL | Status: DC

## 2012-06-02 MED ORDER — HYDROCODONE-ACETAMINOPHEN 7.5-325 MG PO TABS: 1.0000 | ORAL_TABLET | ORAL | Status: AC | PRN

## 2012-06-02 NOTE — Progress Notes (Signed)
Physical Therapy Treatment Patient Details Name: Jonathan Young MRN: [id number] DOB: 11/05/1951 Today's Date: 06/02/2012 Time: 4098-1191 PT Time Calculation (min): 21 min  PT Assessment / Plan / Recommendation Comments on Treatment Session  Pt very motivated but somewhat impulsive    Follow Up Recommendations  Home health PT    Barriers to Discharge        Equipment Recommendations  None recommended by PT    Recommendations for Other Services OT consult  Frequency 7X/week   Plan Discharge plan remains appropriate    Precautions / Restrictions Restrictions Weight Bearing Restrictions: No Other Position/Activity Restrictions: WBAT   Pertinent Vitals/Pain 3/10; premedicated, ice pack provided    Mobility  Bed Mobility Bed Mobility: Sit to Supine Supine to Sit: 4: Min guard Sit to Supine: 4: Min guard Details for Bed Mobility Assistance: cues for sequence and to self assist with R LE Transfers Transfers: Sit to Stand;Stand to Sit Sit to Stand: 4: Min assist Stand to Sit: 4: Min guard Details for Transfer Assistance: cues for LE management and use of UEs Ambulation/Gait Ambulation/Gait Assistance: 4: Min guard Ambulation Distance (Feet): 260 Feet Assistive device: Rolling walker Ambulation/Gait Assistance Details: cues for position from RW Gait Pattern: Step-to pattern;Step-through pattern Stairs: Yes Stairs Assistance: 4: Min assist Stairs Assistance Details (indicate cue type and reason): cues for sequence and foot/RW placement Stair Management Technique: Step to pattern;With walker;Backwards;Forwards;No rails Number of Stairs: 1  (1 step x 2)    Exercises     PT Diagnosis:    PT Problem List:   PT Treatment Interventions:     PT Goals Acute Rehab PT Goals PT Goal Formulation: With patient Time For Goal Achievement: 06/08/12 Potential to Achieve Goals: Good Pt will go Supine/Side to Sit: with supervision PT Goal: Supine/Side to Sit - Progress: Progressing  toward goal Pt will go Sit to Supine/Side: with supervision PT Goal: Sit to Supine/Side - Progress: Progressing toward goal Pt will go Sit to Stand: with supervision PT Goal: Sit to Stand - Progress: Progressing toward goal Pt will go Stand to Sit: with supervision PT Goal: Stand to Sit - Progress: Progressing toward goal Pt will Ambulate: >150 feet;with supervision;with rolling walker PT Goal: Ambulate - Progress: Progressing toward goal Pt will Go Up / Down Stairs: 3-5 stairs;with min assist;with rolling walker PT Goal: Up/Down Stairs - Progress: Progressing toward goal  Visit Information  Last PT Received On: 06/02/12 Assistance Needed: +1    Subjective Data  Subjective: I'm ready Patient Stated Goal: Resume previous lifestyle with decreased pain   Cognition  Overall Cognitive Status: Appears within functional limits for tasks assessed/performed Arousal/Alertness: Awake/alert Orientation Level: Appears intact for tasks assessed Behavior During Session: [employer] for tasks performed    Balance     End of Session PT - End of Session Activity Tolerance: Patient tolerated treatment well Patient left: in bed;with call bell/phone within reach    [facility] 06/02/2012, 3:39 PM

## 2012-06-02 NOTE — Progress Notes (Signed)
OT Note:  Pt screened for OT.  He has had posterior THA in the past, has assistance and all DME.  No needs.  [place], OTR/L [phone] 06/02/2012

## 2012-06-02 NOTE — Discharge Summary (Signed)
Physician Discharge Summary  Patient ID: Jonathan Young MRN: [id number] DOB/AGE: [birth year] 61 y.o.  Admit date: 06/01/2012 Discharge date: 06/03/2012  Procedures:  Procedure(s) (LRB): TOTAL HIP ARTHROPLASTY ANTERIOR APPROACH (Left)  Attending Physician:  Dr. Durene Romans   Admission Diagnoses:  Avascular necrosis, left hip  Discharge Diagnoses:  Principal Problem:  *S/P left THA, AA Hyperlipidemia Reflux Herpetic infection  HPI:  This is a 61 year old gentleman with a history of avascular necrosis of his left hip that has failed conservative treatment. At this time, the patient is now scheduled for total hip arthroplasty by anterior approach. The surgery risks, benefits, and aftercare were discussed in detail with the patient. Questions invited and answered. Note that the patient is going home after surgery. He is a candidate for tranexamic acid and dexamethasone once a month. He is given his home medications of aspirin, iron, Robaxin, MiraLAX, and Colace. Note that he do not have his medical doctor at this time. Not that his medical doctor is Dr. Darryll Capers and he has received medical clearance there.  PCP: Carrie Mew, MD, MD   Discharged Condition: good  Hospital Course:  Patient underwent the above stated procedure on 06/01/2012. Patient tolerated the procedure well and brought to the recovery room in good condition and subsequently to the floor.  POD #1 BP: 106/68 ; Pulse: 77 ; Temp: 98 F (36.7 C) ; Resp: 16 Pt's foley was removed, as well as the hemovac drain removed. IV was changed to a saline lock. Patient reports pain as mild, pain well controlled. No events throughout the night. Neurovascular intact, dorsiflexion/plantar flexion intact, incision: dressing C/D/I, no cellulitis present and compartment soft.   LABS  Basename  06/02/12 0421   HGB  12.5  HCT  37.3   POD #2  BP: 111/72 ; Pulse: 76 ; Temp: 97.9 F (36.6 C) ; Resp: 16  Patient reports pain as  mild, pain well controlled. No events throughout the night. Ready to be discharged home. Neurovascular intact, dorsiflexion/plantar flexion intact, incision: dressing C/D/I, no cellulitis present and compartment soft.   LABS  Basename  06/03/12 0428   HGB  11.9  HCT  35.7    Discharge Exam: General appearance: alert, cooperative and no distress Extremities: Homans sign is negative, no sign of DVT, no edema, redness or tenderness in the calves or thighs and no ulcers, gangrene or trophic changes  Disposition:  Home  with follow up in 2 weeks   Discharge Orders    Future Appointments: Provider: Department: Dept Phone: Center:   07/29/2012 11:15 AM Stacie Glaze, MD [facility] [phone] [facility]     Future Orders Please Complete By Expires   Diet - low sodium heart healthy      Call MD / Call 911      Comments:   If you experience chest pain or shortness of breath, CALL 911 and be transported to the hospital emergency room.  If you develope a fever above 101 F, pus (white drainage) or increased drainage or redness at the wound, or calf pain, call your surgeon's office.   Discharge instructions      Comments:   Maintain surgical dressing for 8 days, then replace with gauze and tape. Keep the area dry and clean until follow up. Follow up in 2 weeks at [facility]. Call with any questions or concerns.     Constipation Prevention      Comments:   Drink plenty of fluids.  Prune juice may be helpful.  You may use a stool softener, such as Colace (over the counter) 100 mg twice a day.  Use MiraLax (over the counter) for constipation as needed.   Increase activity slowly as tolerated      Driving restrictions      Comments:   No driving for 4 weeks   Change dressing      Comments:   Maintain surgical dressing for 8 days, then replace with 4x4 guaze and tape. Keep the area dry and clean.   TED hose      Comments:   Use stockings (TED hose) for 2 weeks on both leg(s).   You may remove them at night for sleeping.      Current Discharge Medication List    START taking these medications   Details  diphenhydrAMINE (BENADRYL) 25 mg capsule Take 1 capsule (25 mg total) by mouth every 6 (six) hours as needed for itching, allergies or sleep.    docusate sodium 100 MG CAPS Take 100 mg by mouth 2 (two) times daily.    ferrous sulfate 325 (65 FE) MG tablet Take 1 tablet (325 mg total) by mouth 3 (three) times daily after meals.    HYDROcodone-acetaminophen (NORCO) 7.5-325 MG per tablet Take 1-2 tablets by mouth every 4 (four) hours as needed for pain. Qty: 120 tablet, Refills: 0    methocarbamol (ROBAXIN) 500 MG tablet Take 1 tablet (500 mg total) by mouth every 6 (six) hours as needed (muscle spasms).    polyethylene glycol (MIRALAX / GLYCOLAX) packet Take 17 g by mouth 2 (two) times daily.      CONTINUE these medications which have CHANGED   Details  aspirin EC 325 MG tablet Take 1 tablet (325 mg total) by mouth 2 (two) times daily. X 4 weeks Qty: 60 tablet, Refills: 0      CONTINUE these medications which have NOT CHANGED   Details  pantoprazole (PROTONIX) 40 MG tablet Take 40 mg by mouth daily.    rosuvastatin (CRESTOR) 10 MG tablet Take 10 mg by mouth daily with breakfast.    Testosterone (ANDROGEL PUMP) 20.25 MG/ACT (1.62%) GEL Place 1 spray onto the skin daily.    valACYclovir (VALTREX) 1000 MG tablet Take 500 mg by mouth daily.     Coenzyme Q10 (COQ-10) 100 MG capsule Take 100 mg by mouth daily.      Cyanocobalamin (B-12 DOTS) 500 MCG TBDP Take 500 mcg by mouth daily.      fish oil-omega-3 fatty acids 1000 MG capsule Take 1 g by mouth daily.    tadalafil (CIALIS) 20 MG tablet Take 20 mg by mouth daily as needed. For erectile dysfunction.    tetrahydrozoline (VISINE) 0.05 % ophthalmic solution Place 1 drop into both eyes 4 (four) times daily as needed. For eye irritation.    urea (CARMOL) 40 % CREA Apply 1 application topically daily as  needed. For warts on feet.    Vitamin D, Ergocalciferol, (DRISDOL) 50000 UNITS CAPS Take 50,000 Units by mouth every Saturday at 6 PM.      STOP taking these medications     ibuprofen (ADVIL) 200 MG tablet Comments:  Reason for Stopping:       meloxicam (MOBIC) 15 MG tablet Comments:  Reason for Stopping:          Follow-up Information    Follow up with Shelda Pal, MD. Schedule an appointment as soon as possible for a visit in 2 weeks.   Contact information:   [facility]  [address], [address] [place] [address] [phone]          Signed: Anastasio Auerbach. Becca Bayne   PAC  06/02/2012, 10:09 AM

## 2012-06-02 NOTE — Care Management Note (Unsigned)
    Page 1 of 2   06/02/2012     11:21:32 AM   CARE MANAGEMENT NOTE 06/02/2012  Patient:  Jonathan Young, Jonathan Young   Account Number:  [id number]  Date Initiated:  06/02/2012  Documentation initiated by:  Colleen Can  Subjective/Objective Assessment:   dx osteoarthritis left hip  Anterior hip replacemnt on day of admission     Action/Plan:   CM spoke with patient. Plans are for patient to return to his home where he will have caregiver. Already has RW, crutches. cane, high toilet seat. Has used [facility] in the past and wants to use them for [facility] services   Anticipated DC Date:  06/03/2012   Anticipated DC Plan:  HOME W HOME HEALTH SERVICES  In-house referral  NA      DC Planning Services  CM consult      [facility] Choice  HOME HEALTH   Choice offered to / List presented to:  C-1 Patient   DME arranged  NA      DME agency  NA     HH arranged  HH-2 PT      [facility] agency  [facility]   Status of service:  Completed, signed off Medicare Important Message given?  NO (If response is "NO", the following Medicare IM given date fields will be blank) Date Medicare IM given:   Date Additional Medicare IM given:    Discharge Disposition:    Per UR Regulation:  Reviewed for med. necessity/level of care/duration of stay  If discussed at Long Length of Stay Meetings, dates discussed:    Comments:  06/01/2012 Raynelle Bring BSN CCM [phone] Services were pre-arranged with Genevieve Norlander prior to admission. LIst of HH agencies placed on chart. HHH services will start day after discharge. Anticipate discharge tomorrow.

## 2012-06-02 NOTE — Progress Notes (Signed)
Subjective: 1 Day Post-Op Procedure(s) (LRB): TOTAL HIP ARTHROPLASTY ANTERIOR APPROACH (Left)   Patient reports pain as mild, pain well controlled. No events throughout the night.  Objective:   VITALS:   Filed Vitals:   06/02/12 0521  BP: 106/68  Pulse: 77  Temp: 98 F (36.7 C)  Resp: 16    Neurovascular intact Dorsiflexion/Plantar flexion intact Incision: dressing C/D/I No cellulitis present Compartment soft  LABS  Basename 06/02/12 0421  HGB 12.5*  HCT 37.3*  WBC 11.0*  PLT 249     Basename 06/02/12 0421  NA 136  K 4.9  BUN 14  CREATININE 1.19  GLUCOSE 146*     Assessment/Plan: 1 Day Post-Op Procedure(s) (LRB): TOTAL HIP ARTHROPLASTY ANTERIOR APPROACH (Left)   Foley cath d/c'ed HV drain d/c'ed Advance diet Up with therapy D/C IV fluids Discharge home with home health when ready, possible Wednesday   Jonathan Young. Jonathan Young   PAC  06/02/2012, 9:47 AM

## 2012-06-02 NOTE — Evaluation (Signed)
Physical Therapy Evaluation Patient Details Name: Jonathan Young MRN: [id number] DOB: 06/20/1951 Today's Date: 06/02/2012 Time: 4098-1191 PT Time Calculation (min): 27 min  PT Assessment / Plan / Recommendation Clinical Impression  Pt with L THR presents with decreased L LE strength/ROM and limitations in functional mobility.    PT Assessment  Patient needs continued PT services    Follow Up Recommendations  Home health PT    Barriers to Discharge        lEquipment Recommendations  None recommended by PT    Recommendations for Other Services OT consult   Frequency 7X/week    Precautions / Restrictions Restrictions Weight Bearing Restrictions: No Other Position/Activity Restrictions: WBAT   Pertinent Vitals/Pain 4/10 premedicated, ice pack provided      Mobility  Bed Mobility Bed Mobility: Supine to Sit Supine to Sit: 4: Min assist Details for Bed Mobility Assistance: cues for sequence and to self assist with R LE Transfers Transfers: Sit to Stand;Stand to Sit Sit to Stand: 4: Min assist Stand to Sit: 4: Min assist Details for Transfer Assistance: cues for LE management and use of UEs Ambulation/Gait Ambulation/Gait Assistance: 4: Min assist Ambulation Distance (Feet): 100 Feet Assistive device: Rolling walker Ambulation/Gait Assistance Details: cues for position from RW and posture Gait Pattern: Step-to pattern;Step-through pattern    Exercises Total Joint Exercises Ankle Circles/Pumps: AROM;10 reps;Supine;Both Quad Sets: AROM;10 reps;Both;Supine Heel Slides: AAROM;10 reps;Supine;Left Hip ABduction/ADduction: AAROM;10 reps;Left;Supine   PT Diagnosis: Difficulty walking  PT Problem List: Decreased strength;Decreased range of motion;Decreased activity tolerance;Decreased mobility;Decreased knowledge of use of DME;Pain PT Treatment Interventions: DME instruction;Stair training;Gait training;Functional mobility training;Therapeutic activities;Therapeutic  exercise;Patient/family education   PT Goals Acute Rehab PT Goals PT Goal Formulation: With patient Time For Goal Achievement: 06/08/12 Potential to Achieve Goals: Good Pt will go Supine/Side to Sit: with supervision Pt will go Sit to Supine/Side: with supervision Pt will go Sit to Stand: with supervision Pt will go Stand to Sit: with supervision Pt will Ambulate: >150 feet;with supervision;with rolling walker Pt will Go Up / Down Stairs: 3-5 stairs;with min assist;with rolling walker  Visit Information  Last PT Received On: 06/02/12 Assistance Needed: +1    Subjective Data  Subjective: I have a high pain tolerance Patient Stated Goal: Resume previous lifestyle with decreased pain   Prior Functioning  Home Living Lives With: Alone Available Help at Discharge: Friend(s) Type of Home: House Home Access: Stairs to enter [employer] of Steps: 4 (each step is deep enough to set RW on) Entrance Stairs-Rails: None Home Layout: One level Home Adaptive Equipment: Walker - rolling Prior Function Level of Independence: Independent Able to Take Stairs?: Yes Driving: Yes Vocation: Full time employment Communication Communication: No difficulties Dominant Hand: Left    Cognition  Overall Cognitive Status: Appears within functional limits for tasks assessed/performed Arousal/Alertness: Awake/alert Orientation Level: Appears intact for tasks assessed Behavior During Session: [facility] for tasks performed    Extremity/Trunk Assessment Right Upper Extremity Assessment RUE ROM/Strength/Tone: Within functional levels Left Upper Extremity Assessment LUE ROM/Strength/Tone: Within functional levels Right Lower Extremity Assessment RLE ROM/Strength/Tone: [facility] for tasks assessed Left Lower Extremity Assessment LLE ROM/Strength/Tone: Deficits LLE ROM/Strength/Tone Deficits: AAROM to 90 hip flex and 30 hip abd with 2+/5 hip strength   Balance    End of Session PT - End of  Session Activity Tolerance: Patient tolerated treatment well Patient left: in chair;with call bell/phone within reach Nurse Communication: Mobility status   Jonathan Young 06/02/2012, 9:16 AM

## 2012-06-03 LAB — CBC
HCT: 35.7 % — ABNORMAL LOW (ref 39.0–52.0)
Hemoglobin: 11.9 g/dL — ABNORMAL LOW (ref 13.0–17.0)
MCV: 93.5 fL (ref 78.0–100.0)
RDW: 12.8 % (ref 11.5–15.5)
WBC: 11.2 10*3/uL — ABNORMAL HIGH (ref 4.0–10.5)

## 2012-06-03 LAB — BASIC METABOLIC PANEL
BUN: 18 mg/dL (ref 6–23)
CO2: 27 mEq/L (ref 19–32)
Chloride: 103 mEq/L (ref 96–112)
Creatinine, Ser: 1.03 mg/dL (ref 0.50–1.35)
Glucose, Bld: 125 mg/dL — ABNORMAL HIGH (ref 70–99)
Potassium: 4.5 mEq/L (ref 3.5–5.1)

## 2012-06-03 NOTE — Progress Notes (Signed)
Subjective: 2 Days Post-Op Procedure(s) (LRB): TOTAL HIP ARTHROPLASTY ANTERIOR APPROACH (Left)   Patient reports pain as mild, pain well controlled. No events throughout the night. Ready to be discharged home.  Objective:   VITALS:   Filed Vitals:   06/03/12 0552  BP: 111/72  Pulse: 76  Temp: 97.9 F (36.6 C)  Resp: 16    Neurovascular intact Dorsiflexion/Plantar flexion intact Incision: dressing C/D/I No cellulitis present Compartment soft  LABS  Basename 06/03/12 0428 06/02/12 0421  HGB 11.9* 12.5*  HCT 35.7* 37.3*  WBC 11.2* 11.0*  PLT 244 249     Basename 06/03/12 0428 06/02/12 0421  NA 138 136  K 4.5 4.9  BUN 18 14  CREATININE 1.03 1.19  GLUCOSE 125* 146*     Assessment/Plan: 2 Days Post-Op Procedure(s) (LRB): TOTAL HIP ARTHROPLASTY ANTERIOR APPROACH (Left)   Up with therapy Discharge home with home health Follow up in 2 weeks at [facility].  Follow-up Information    Follow up with OLIN,Jovan Colligan D in 2 weeks.   Contact information:   [facility] [address] [place] [address] [phone]          Anastasio Auerbach. Bennett Ram   PAC  06/03/2012, 8:43 AM

## 2012-06-03 NOTE — Progress Notes (Signed)
Discharge summary sent to payer through [facility]  

## 2012-06-03 NOTE — Progress Notes (Signed)
Patient sitting in bed, no signs of acute distress noted. Discharge instructions and education given with teach back. He states ride will be here around 1pm for his discharge home.

## 2012-06-03 NOTE — Progress Notes (Signed)
Physical Therapy Treatment Patient Details Name: Jonathan Young MRN: [id number] DOB: [birth year] Today's Date: 06/03/2012 Time: 4098-1191 PT Time Calculation (min): 35 min  PT Assessment / Plan / Recommendation Comments on Treatment Session  Pt very motivated but somewhat impulsive.  Reviewed car transfers     Follow Up Recommendations  Home health PT    Barriers to Discharge        Equipment Recommendations  None recommended by PT    Recommendations for Other Services OT consult  Frequency 7X/week   Plan Discharge plan remains appropriate    Precautions / Restrictions Restrictions Weight Bearing Restrictions: No Other Position/Activity Restrictions: WBAT   Pertinent Vitals/Pain 3/10; premedicated; ice pack provided    Mobility  Bed Mobility Bed Mobility: Sit to Supine;Supine to Sit Supine to Sit: 5: Supervision Sit to Supine: 5: Supervision Transfers Transfers: Sit to Stand;Stand to Sit Sit to Stand: 5: Supervision Stand to Sit: 5: Supervision Details for Transfer Assistance: cues for use of UEs Ambulation/Gait Ambulation/Gait Assistance: 4: Min guard;5: Supervision Ambulation Distance (Feet): 400 Feet Assistive device: Rolling walker Ambulation/Gait Assistance Details: cues for position from RW Gait Pattern: Step-through pattern Stairs:  (Pt states he feels comfortable with stairs)    Exercises Total Joint Exercises Ankle Circles/Pumps: AROM;20 reps;Both;Supine Quad Sets: AROM;20 reps;Both;Supine Gluteal Sets: 20 reps;Both;Supine;AROM Heel Slides: AAROM;20 reps;Supine;Left Hip ABduction/ADduction: AAROM;20 reps;Left;Supine   PT Diagnosis:    PT Problem List:   PT Treatment Interventions:     PT Goals Acute Rehab PT Goals PT Goal Formulation: With patient Time For Goal Achievement: 06/08/12 Potential to Achieve Goals: Good Pt will go Supine/Side to Sit: with supervision PT Goal: Supine/Side to Sit - Progress: Met Pt will go Sit to Supine/Side: with  supervision PT Goal: Sit to Supine/Side - Progress: Met Pt will go Sit to Stand: with supervision PT Goal: Sit to Stand - Progress: Met Pt will go Stand to Sit: with supervision PT Goal: Stand to Sit - Progress: Met Pt will Ambulate: >150 feet;with supervision;with rolling walker PT Goal: Ambulate - Progress: Met Pt will Go Up / Down Stairs: 3-5 stairs;with min assist;with rolling walker PT Goal: Up/Down Stairs - Progress: Progressing toward goal  Visit Information  Last PT Received On: 06/03/12 Assistance Needed: +1    Subjective Data  Subjective: I feel a little stiff Patient Stated Goal: Resume previous lifestyle with decreased pain   Cognition  Overall Cognitive Status: Appears within functional limits for tasks assessed/performed Arousal/Alertness: Awake/alert Orientation Level: Appears intact for tasks assessed Behavior During Session: [facility] for tasks performed    Balance     End of Session PT - End of Session Activity Tolerance: Patient tolerated treatment well Patient left: in bed;with call bell/phone within reach Nurse Communication: Mobility status    Jonathan Young 06/03/2012, 12:49 PM

## 2012-07-29 ENCOUNTER — Encounter: Payer: Self-pay | Admitting: Internal Medicine

## 2012-07-29 ENCOUNTER — Ambulatory Visit (INDEPENDENT_AMBULATORY_CARE_PROVIDER_SITE_OTHER): Payer: BC Managed Care – PPO | Admitting: Internal Medicine

## 2012-07-29 VITALS — BP 140/90 | HR 72 | Temp 98.6°F | Resp 16 | Ht 73.0 in | Wt 199.0 lb

## 2012-07-29 DIAGNOSIS — E291 Testicular hypofunction: Secondary | ICD-10-CM

## 2012-07-29 DIAGNOSIS — E039 Hypothyroidism, unspecified: Secondary | ICD-10-CM

## 2012-07-29 DIAGNOSIS — E785 Hyperlipidemia, unspecified: Secondary | ICD-10-CM

## 2012-07-29 DIAGNOSIS — E559 Vitamin D deficiency, unspecified: Secondary | ICD-10-CM

## 2012-07-29 DIAGNOSIS — R739 Hyperglycemia, unspecified: Secondary | ICD-10-CM

## 2012-07-29 DIAGNOSIS — R972 Elevated prostate specific antigen [PSA]: Secondary | ICD-10-CM

## 2012-07-29 DIAGNOSIS — R7309 Other abnormal glucose: Secondary | ICD-10-CM

## 2012-07-29 DIAGNOSIS — D649 Anemia, unspecified: Secondary | ICD-10-CM

## 2012-07-29 LAB — LIPID PANEL
Cholesterol: 175 mg/dL (ref 0–200)
VLDL: 19.4 mg/dL (ref 0.0–40.0)

## 2012-07-29 LAB — HEPATIC FUNCTION PANEL
ALT: 32 U/L (ref 0–53)
AST: 31 U/L (ref 0–37)
Albumin: 4.2 g/dL (ref 3.5–5.2)
Alkaline Phosphatase: 61 U/L (ref 39–117)
Total Protein: 6.7 g/dL (ref 6.0–8.3)

## 2012-07-29 LAB — CBC WITH DIFFERENTIAL/PLATELET
Basophils Relative: 0.6 % (ref 0.0–3.0)
Eosinophils Absolute: 0.2 10*3/uL (ref 0.0–0.7)
Eosinophils Relative: 3.9 % (ref 0.0–5.0)
Hemoglobin: 14.1 g/dL (ref 13.0–17.0)
MCHC: 33.1 g/dL (ref 30.0–36.0)
MCV: 93.2 fl (ref 78.0–100.0)
Monocytes Absolute: 0.5 10*3/uL (ref 0.1–1.0)
Neutro Abs: 3.2 10*3/uL (ref 1.4–7.7)
Neutrophils Relative %: 53 % (ref 43.0–77.0)
RBC: 4.57 Mil/uL (ref 4.22–5.81)
WBC: 6 10*3/uL (ref 4.5–10.5)

## 2012-07-29 NOTE — Progress Notes (Signed)
Subjective:    Patient ID: Jonathan Young, male    DOB: 10-25-1951, 61 y.o.   MRN: [id number]  HPI  Follow up lipids and hypogonadism   Review of Systems  Constitutional: Negative for fever and fatigue.  HENT: Negative for hearing loss, congestion, neck pain and postnasal drip.   Eyes: Negative for discharge, redness and visual disturbance.  Respiratory: Negative for cough, shortness of breath and wheezing.   Cardiovascular: Negative for leg swelling.  Gastrointestinal: Negative for abdominal pain, constipation and abdominal distention.  Genitourinary: Negative for urgency and frequency.  Musculoskeletal: Negative for joint swelling and arthralgias.  Skin: Negative for color change and rash.  Neurological: Negative for weakness and light-headedness.  Hematological: Negative for adenopathy.  Psychiatric/Behavioral: Negative for behavioral problems.   Past Medical History  Diagnosis Date  . Hypercholesteremia   . Osteoarthritis   . Dysrhythmia     PVC'S  . GERD (gastroesophageal reflux disease)   . Sleep apnea     STOP BANG SCORE 4    History   Social History  . Marital Status: Single    Spouse Name: N/A    Number of Children: N/A  . Years of Education: N/A   Occupational History  . Not on file.   Social History Main Topics  . Smoking status: Former Smoker    Quit date: 05/27/1981  . Smokeless tobacco: Never Used  . Alcohol Use: 7.0 oz/week    14 drink(s) per week  . Drug Use: No  . Sexually Active: Not on file   Other Topics Concern  . Not on file   Social History Narrative  . No narrative on file    Past Surgical History  Procedure Date  . Total hip arthroplasty     RT TOAL HIP  . Hemorrhoid surgery 1982  . Tonsillectomy and adenoidectomy   . Shoulder surgery     BIL  . Total hip arthroplasty 06/01/2012    Procedure: TOTAL HIP ARTHROPLASTY ANTERIOR APPROACH;  Surgeon: Shelda Pal, MD;  Location: [facility];  Service: Orthopedics;  Laterality: Left;      Family History  Problem Relation Age of Onset  . Emphysema Mother     Allergies  Allergen Reactions  . Ezetimibe-Simvastatin Other (See Comments)    "Thought I was loosing my mind"    Current Outpatient Prescriptions on File Prior to Visit  Medication Sig Dispense Refill  . Coenzyme Q10 (COQ-10) 100 MG capsule Take 100 mg by mouth daily.        . Cyanocobalamin (B-12 DOTS) 500 MCG TBDP Take 500 mcg by mouth daily.        . fish oil-omega-3 fatty acids 1000 MG capsule Take 1 g by mouth daily.      . pantoprazole (PROTONIX) 40 MG tablet Take 40 mg by mouth daily.      . rosuvastatin (CRESTOR) 10 MG tablet Take 10 mg by mouth daily with breakfast.      . tadalafil (CIALIS) 20 MG tablet Take 20 mg by mouth daily as needed. For erectile dysfunction.      . Testosterone (ANDROGEL PUMP) 20.25 MG/ACT (1.62%) GEL Place 1 spray onto the skin daily.      . urea (CARMOL) 40 % CREA Apply 1 application topically daily as needed. For warts on feet.      . valACYclovir (VALTREX) 1000 MG tablet Take 500 mg by mouth daily.       . Vitamin D, Ergocalciferol, (DRISDOL) 50000 UNITS CAPS Take  50,000 Units by mouth every Saturday at 6 PM.        BP 140/90  Pulse 72  Temp 98.6 F (37 C)  Resp 16  Ht 6\' 1"  (1.854 m)  Wt 199 lb (90.266 kg)  BMI 26.25 kg/m2       Objective:   Physical Exam  Nursing note and vitals reviewed. Constitutional: He appears well-developed and well-nourished.  HENT:  Head: Normocephalic and atraumatic.  Eyes: Conjunctivae are normal. Pupils are equal, round, and reactive to light.  Neck: Normal range of motion. Neck supple.  Cardiovascular: Normal rate and regular rhythm.   Pulmonary/Chest: Effort normal and breath sounds normal.  Abdominal: Soft. Bowel sounds are normal.          Assessment & Plan:  Had mild prostate symptoms Discussion of fish oil And the use of cholesterol   We will check a PSA and treatment the PSA is elevated then repeat PSA after  antibiotic course.  We will monitor his cholesterol and lipid liver panel to see how the Crestor and the use of his whole is affecting his cholesterol risk.  He is on vitamin D supplementation for osteopenia and we will check his vitamin D as well as calcium levels we will look at his renal function following his hip surgery because the pre-surgical creatinine was moderately elevated.

## 2012-07-29 NOTE — Patient Instructions (Signed)
The patient is instructed to continue all medications as prescribed. Schedule followup with check out clerk upon leaving the clinic  

## 2012-07-30 LAB — TESTOSTERONE, FREE, TOTAL, SHBG
Sex Hormone Binding: 32 nmol/L (ref 13–71)
Testosterone, Free: 105.3 pg/mL (ref 47.0–244.0)
Testosterone-% Free: 2.1 % (ref 1.6–2.9)

## 2012-07-30 LAB — VITAMIN D 25 HYDROXY (VIT D DEFICIENCY, FRACTURES): Vit D, 25-Hydroxy: 56 ng/mL (ref 30–89)

## 2012-07-30 LAB — HEMOGLOBIN A1C: Hgb A1c MFr Bld: 5.2 % (ref 4.6–6.5)

## 2012-08-06 ENCOUNTER — Other Ambulatory Visit: Payer: Self-pay | Admitting: Internal Medicine

## 2012-09-22 ENCOUNTER — Other Ambulatory Visit: Payer: Self-pay | Admitting: Internal Medicine

## 2012-09-28 ENCOUNTER — Other Ambulatory Visit: Payer: Self-pay | Admitting: Internal Medicine

## 2012-10-13 ENCOUNTER — Other Ambulatory Visit (HOSPITAL_COMMUNITY): Payer: Self-pay | Admitting: Orthopedic Surgery

## 2012-10-13 DIAGNOSIS — M25552 Pain in left hip: Secondary | ICD-10-CM

## 2012-10-20 ENCOUNTER — Ambulatory Visit (HOSPITAL_COMMUNITY)
Admission: RE | Admit: 2012-10-20 | Discharge: 2012-10-20 | Disposition: A | Discharge status: Home / Self Care | Payer: BC Managed Care – PPO | Source: Ambulatory Visit | Attending: Orthopedic Surgery | Admitting: Orthopedic Surgery

## 2012-10-20 ENCOUNTER — Encounter (HOSPITAL_COMMUNITY)
Admission: RE | Admit: 2012-10-20 | Discharge: 2012-10-20 | Disposition: A | Discharge status: Home / Self Care | Payer: BC Managed Care – PPO | Source: Ambulatory Visit | Attending: Orthopedic Surgery | Admitting: Orthopedic Surgery

## 2012-10-20 DIAGNOSIS — Z96649 Presence of unspecified artificial hip joint: Secondary | ICD-10-CM | POA: Insufficient documentation

## 2012-10-20 DIAGNOSIS — M25552 Pain in left hip: Secondary | ICD-10-CM

## 2012-10-20 DIAGNOSIS — M25559 Pain in unspecified hip: Secondary | ICD-10-CM | POA: Insufficient documentation

## 2012-10-20 MED ORDER — TECHNETIUM TC 99M MEDRONATE IV KIT
25.0000 | PACK | Freq: Once | INTRAVENOUS | Status: AC | PRN
  Administered 2012-10-20: 25 via INTRAVENOUS

## 2012-11-03 ENCOUNTER — Other Ambulatory Visit: Payer: Self-pay | Admitting: Internal Medicine

## 2012-11-10 ENCOUNTER — Other Ambulatory Visit: Payer: Self-pay | Admitting: Internal Medicine

## 2012-12-03 ENCOUNTER — Telehealth: Payer: Self-pay | Admitting: Internal Medicine

## 2012-12-03 ENCOUNTER — Ambulatory Visit (INDEPENDENT_AMBULATORY_CARE_PROVIDER_SITE_OTHER): Payer: BC Managed Care – PPO | Admitting: Family Medicine

## 2012-12-03 ENCOUNTER — Encounter: Payer: Self-pay | Admitting: Family Medicine

## 2012-12-03 VITALS — BP 102/72 | HR 91 | Temp 97.8°F | Wt 204.0 lb

## 2012-12-03 DIAGNOSIS — H938X9 Other specified disorders of ear, unspecified ear: Secondary | ICD-10-CM

## 2012-12-03 DIAGNOSIS — H9190 Unspecified hearing loss, unspecified ear: Secondary | ICD-10-CM

## 2012-12-03 DIAGNOSIS — H938X1 Other specified disorders of right ear: Secondary | ICD-10-CM

## 2012-12-03 NOTE — Telephone Encounter (Signed)
Patient Information:  Caller Name: Ozella Rocks  Phone: [phone]  Patient: Jonathan Young, Jonathan Young  Gender: Male  DOB: 1951-07-28  Age: 61 Years  PCP: Darryll Capers (Adults only)  Office Follow Up:  Does the office need to follow up with this patient?: No  Instructions For The Office: N/A  RN Note:  Patient was seen in Urgent care 11/24/12 for Rt ear congestion with no pain and was informed that he had an ear infection.  Patient was placed on Ceftin with minimal improvement where ear will open occassionaly otherwise is unable to hear from rt ear.  Was told that it was not blocked due to ear wax at visit.  Symptoms  Reason For Call & Symptoms: Ear congestion  Reviewed Health History In EMR: Yes  Reviewed Medications In EMR: Yes  Reviewed Allergies In EMR: Yes  Reviewed Surgeries / Procedures: Yes  Date of Onset of Symptoms: 11/24/2012  Treatments Tried: Ceftin  Treatments Tried Worked: No  Guideline(s) Used:  Ear - Congestion  Disposition Per Guideline:   See Within 3 Days in Office  Reason For Disposition Reached:   Ear congestion present > 48 hours  Advice Given:  N/A  Appointment Scheduled:  12/03/2012 15:45:00 Appointment Scheduled Provider:  Kriste Basque (Family Practice)

## 2012-12-03 NOTE — Progress Notes (Signed)
Chief Complaint  Patient presents with  . ear congestion    going on 3rd week of being clogged.     HPI: -started: for last few weeks -symptoms:R ear feels clogged - went to [facility] e[facility] the 3rd and took course of ceftin - seems a little better the last few days - opens up a little sometimes, hearing loss in this ear and some ringing in this ear, has allergies/nasal symptoms this time of the year, he is very concerned about this and worried he could go deaf -denies:fever, SOB, NVD, tooth pain, strep or mono exposure -has tried: sudafed occ, ceftin -sick contacts: none  ROS: See pertinent positives and negatives per HPI.  Past Medical History  Diagnosis Date  . Hypercholesteremia   . Osteoarthritis   . Dysrhythmia     PVC'S  . GERD (gastroesophageal reflux disease)   . Sleep apnea     STOP BANG SCORE 4    Family History  Problem Relation Age of Onset  . Emphysema Mother     History   Social History  . Marital Status: Single    Spouse Name: N/A    Number of Children: N/A  . Years of Education: N/A   Social History Main Topics  . Smoking status: Former Smoker    Quit date: 05/27/1981  . Smokeless tobacco: Never Used  . Alcohol Use: 7.0 oz/week    14 drink(s) per week  . Drug Use: No  . Sexually Active: None   Other Topics Concern  . None   Social History Narrative  . None    Current outpatient prescriptions:ANDROGEL PUMP 20.25 MG/ACT (1.62%) GEL, APPLY AS INSTRUCTED BY YOUR DOCTOR, Disp: 75 g, Rfl: 5;  CIALIS 20 MG tablet, TAKE 1 TABLET 1 HOUR BEFORE SEXUAL ACTIVITY. DO NOT EXCEED 1 DOSE IN 24 HOURS., Disp: 10 each, Rfl: 5;  Coenzyme Q10 (COQ-10) 100 MG capsule, Take 100 mg by mouth daily.  , Disp: , Rfl: ;  CRESTOR 10 MG tablet, take 1 tablet by mouth once daily, Disp: 30 each, Rfl: 4 Cyanocobalamin (B-12 DOTS) 500 MCG TBDP, Take 500 mcg by mouth daily.  , Disp: , Rfl: ;  fish oil-omega-3 fatty acids 1000 MG capsule, Take 1 g by mouth daily., Disp: , Rfl: ;   LORazepam (ATIVAN) 1 MG tablet, take 1 tablet by mouth once daily if needed, Disp: 30 tablet, Rfl: 5;  pantoprazole (PROTONIX) 40 MG tablet, Take 40 mg by mouth daily., Disp: , Rfl:  pantoprazole (PROTONIX) 40 MG tablet, TAKE 1 TABLET BY MOUTH ONCE DAILY, Disp: 30 tablet, Rfl: 4;  rosuvastatin (CRESTOR) 10 MG tablet, Take 10 mg by mouth daily with breakfast., Disp: , Rfl: ;  tadalafil (CIALIS) 20 MG tablet, Take 20 mg by mouth daily as needed. For erectile dysfunction., Disp: , Rfl: ;  urea (CARMOL) 40 % CREA, Apply 1 application topically daily as needed. For warts on feet., Disp: , Rfl:  valACYclovir (VALTREX) 1000 MG tablet, Take 500 mg by mouth daily. , Disp: , Rfl: ;  Vitamin D, Ergocalciferol, (DRISDOL) 50000 UNITS CAPS, Take 50,000 Units by mouth every Saturday at 6 PM., Disp: , Rfl:   EXAM:  Filed Vitals:   12/03/12 1552  BP: 102/72  Pulse: 91  Temp: 97.8 F (36.6 C)    There is no height on file to calculate BMI.  GENERAL: vitals reviewed and listed above, alert, oriented, appears well hydrated and in no acute distress  HEENT: atraumatic, conjunttiva clear, no  obvious abnormalities on inspection of external nose and ears, clear ear canals, TMs pearly grey and clear with small amount of fluid behind both TMs, clear rhinorrhea, PND  NECK: no obvious masses on inspection  LUNGS: clear to auscultation bilaterally, no wheezes, rales or rhonchi, good air movement  CV: HRRR, no peripheral edema  MS: moves all extremities without noticeable abnormality  PSYCH: pleasant and cooperative, no obvious depression or anxiety  ASSESSMENT AND PLAN:  Discussed the following assessment and plan:  1. Hearing loss   2. Sensation of fullness in right ear    -discussed that this likely is eustachian tube dysfunction - pt would like to see an ENT doctor to ensure no other etiology - appt scheduled for tomorrow -Patient advised to return or notify a doctor immediately if symptoms worsen or  persist or new concerns arise.  There are no Patient Instructions on file for this visit.   Kriste Basque R.

## 2013-02-06 ENCOUNTER — Other Ambulatory Visit: Payer: Self-pay | Admitting: Internal Medicine

## 2013-03-01 ENCOUNTER — Other Ambulatory Visit: Payer: Self-pay | Admitting: Internal Medicine

## 2013-03-02 ENCOUNTER — Other Ambulatory Visit (INDEPENDENT_AMBULATORY_CARE_PROVIDER_SITE_OTHER): Payer: BC Managed Care – PPO

## 2013-03-02 DIAGNOSIS — Z0000 Encounter for general adult medical examination without abnormal findings: Secondary | ICD-10-CM

## 2013-03-02 LAB — BASIC METABOLIC PANEL
BUN: 19 mg/dL (ref 6–23)
Calcium: 9.3 mg/dL (ref 8.4–10.5)
GFR: 62.75 mL/min (ref >60.00)
Glucose, Bld: 99 mg/dL (ref 70–99)
Sodium: 141 mEq/L (ref 135–145)

## 2013-03-02 LAB — CBC WITH DIFFERENTIAL/PLATELET
Basophils Absolute: 0 10*3/uL (ref 0.0–0.1)
Eosinophils Relative: 5.6 % — ABNORMAL HIGH (ref 0.0–5.0)
Lymphocytes Relative: 38.9 % (ref 12.0–46.0)
Monocytes Relative: 8.7 % (ref 3.0–12.0)
Platelets: 204 10*3/uL (ref 150.0–400.0)
RDW: 12.6 % (ref 11.5–14.6)
WBC: 5 10*3/uL (ref 4.5–10.5)

## 2013-03-02 LAB — LIPID PANEL
Cholesterol: 193 mg/dL (ref 0–200)
HDL: 37.9 mg/dL — ABNORMAL LOW (ref >39.00)
Total CHOL/HDL Ratio: 5
Triglycerides: 228 mg/dL — ABNORMAL HIGH (ref 0.0–149.0)

## 2013-03-02 LAB — HEPATIC FUNCTION PANEL
AST: 33 U/L (ref 0–37)
Alkaline Phosphatase: 54 U/L (ref 39–117)
Bilirubin, Direct: 0.1 mg/dL (ref 0.0–0.3)
Total Bilirubin: 0.6 mg/dL (ref 0.3–1.2)

## 2013-03-02 LAB — POCT URINALYSIS DIPSTICK
Bilirubin, UA: NEGATIVE
Glucose, UA: NEGATIVE
Ketones, UA: NEGATIVE
Spec Grav, UA: 1.015
Urobilinogen, UA: 0.2

## 2013-03-08 ENCOUNTER — Encounter: Payer: Self-pay | Admitting: Internal Medicine

## 2013-03-08 ENCOUNTER — Ambulatory Visit (INDEPENDENT_AMBULATORY_CARE_PROVIDER_SITE_OTHER): Payer: BC Managed Care – PPO | Admitting: Internal Medicine

## 2013-03-08 VITALS — BP 140/90 | HR 64 | Temp 97.6°F | Ht 73.0 in | Wt 208.0 lb

## 2013-03-08 DIAGNOSIS — Z0000 Encounter for general adult medical examination without abnormal findings: Secondary | ICD-10-CM

## 2013-03-08 NOTE — Patient Instructions (Signed)
Return for mole chanck

## 2013-03-08 NOTE — Progress Notes (Signed)
Subjective:    Patient ID: Jonathan Young, male    DOB: 08-08-1951, 62 y.o.   MRN: [id number]  HPI CPX   Review of Systems  Constitutional: Negative for fever and fatigue.  HENT: Negative for hearing loss, congestion, neck pain and postnasal drip.   Eyes: Negative for discharge, redness and visual disturbance.  Respiratory: Negative for cough, shortness of breath and wheezing.   Cardiovascular: Negative for leg swelling.  Gastrointestinal: Negative for abdominal pain, constipation and abdominal distention.  Genitourinary: Negative for urgency and frequency.  Musculoskeletal: Negative for joint swelling and arthralgias.  Skin: Negative for color change and rash.  Neurological: Negative for weakness and light-headedness.  Hematological: Negative for adenopathy.  Psychiatric/Behavioral: Negative for behavioral problems.       Past Medical History  Diagnosis Date  . Hypercholesteremia   . Osteoarthritis   . Dysrhythmia     PVC'S  . GERD (gastroesophageal reflux disease)   . Sleep apnea     STOP BANG SCORE 4    History   Social History  . Marital Status: Single    Spouse Name: N/A    Number of Children: N/A  . Years of Education: N/A   Occupational History  . Not on file.   Social History Main Topics  . Smoking status: Former Smoker    Quit date: 05/27/1981  . Smokeless tobacco: Never Used  . Alcohol Use: 7.0 oz/week    14 drink(s) per week  . Drug Use: No  . Sexually Active: Not on file   Other Topics Concern  . Not on file   Social History Narrative  . No narrative on file    Past Surgical History  Procedure Laterality Date  . Total hip arthroplasty      RT TOAL HIP  . Hemorrhoid surgery  1982  . Tonsillectomy and adenoidectomy    . Shoulder surgery      BIL  . Total hip arthroplasty  06/01/2012    Procedure: TOTAL HIP ARTHROPLASTY ANTERIOR APPROACH;  Surgeon: Shelda Pal, MD;  Location: [facility];  Service: Orthopedics;  Laterality: Left;     Family History  Problem Relation Age of Onset  . Emphysema Mother     Allergies  Allergen Reactions  . Ezetimibe-Simvastatin Other (See Comments)    "Thought I was loosing my mind"    Current Outpatient Prescriptions on File Prior to Visit  Medication Sig Dispense Refill  . ANDROGEL PUMP 20.25 MG/ACT (1.62%) GEL APPLY AS INSTRUCTED BY YOUR DOCTOR  75 g  5  . CIALIS 20 MG tablet TAKE 1 TABLET 1 HOUR BEFORE SEXUAL ACTIVITY. DO NOT EXCEED 1 DOSE IN 24 HOURS.  10 each  5  . Coenzyme Q10 (COQ-10) 100 MG capsule Take 100 mg by mouth daily.        . CRESTOR 10 MG tablet take 1 tablet by mouth once daily  30 each  4  . Cyanocobalamin (B-12 DOTS) 500 MCG TBDP Take 500 mcg by mouth daily.        . fish oil-omega-3 fatty acids 1000 MG capsule Take 1 g by mouth daily.      Marland Kitchen LORazepam (ATIVAN) 1 MG tablet take 1 tablet by mouth once daily if needed  30 tablet  5  . meloxicam (MOBIC) 15 MG tablet TAKE ONE TABLET BY MOUTH ONE TIME DAILY  30 tablet  4  . pantoprazole (PROTONIX) 40 MG tablet Take 40 mg by mouth daily.      Marland Kitchen  rosuvastatin (CRESTOR) 10 MG tablet Take 10 mg by mouth daily with breakfast.      . urea (CARMOL) 40 % CREA Apply 1 application topically daily as needed. For warts on feet.      . valACYclovir (VALTREX) 1000 MG tablet Take 500 mg by mouth daily.       . Vitamin D, Ergocalciferol, (DRISDOL) 50000 UNITS CAPS Take 50,000 Units by mouth every Saturday at 6 PM.       No current facility-administered medications on file prior to visit.    BP 140/90  Pulse 64  Temp(Src) 97.6 F (36.4 C) (Oral)  Ht 6\' 1"  (1.854 m)  Wt 208 lb (94.348 kg)  BMI 27.45 kg/m2    Objective:   Physical Exam  Nursing note and vitals reviewed. Constitutional: He is oriented to person, place, and time. He appears well-developed and well-nourished.  HENT:  Head: Normocephalic and atraumatic.  Eyes: Conjunctivae are normal. Pupils are equal, round, and reactive to light.  Neck: Normal range of  motion. Neck supple.  Cardiovascular: Normal rate and regular rhythm.   Pulmonary/Chest: Effort normal and breath sounds normal.  Abdominal: Soft. Bowel sounds are normal.  Genitourinary: Rectum normal and prostate normal.  Musculoskeletal: Normal range of motion.  Neurological: He is alert and oriented to person, place, and time.  Skin: Skin is warm and dry.  Psychiatric: He has a normal mood and affect. His behavior is normal.          Assessment & Plan:   Patient presents for yearly preventative medicine examination.   all immunizations and health maintenance protocols were reviewed with the patient and they are up to date with these protocols.   screening laboratory values were reviewed with the patient including screening of hyperlipidemia PSA renal function and hepatic function.   There medications past medical history social history problem list and allergies were reviewed in detail.   Goals were established with regard to weight loss exercise diet in compliance with medications MOLE ON CHEST WITH COLOR AND SIZE CHANGE

## 2013-03-12 ENCOUNTER — Other Ambulatory Visit: Payer: Self-pay | Admitting: Internal Medicine

## 2013-04-07 ENCOUNTER — Encounter: Payer: Self-pay | Admitting: Internal Medicine

## 2013-04-07 ENCOUNTER — Ambulatory Visit (INDEPENDENT_AMBULATORY_CARE_PROVIDER_SITE_OTHER): Payer: BC Managed Care – PPO | Admitting: Internal Medicine

## 2013-04-07 VITALS — BP 140/90 | HR 76 | Temp 98.2°F | Resp 16 | Ht 73.0 in | Wt 208.0 lb

## 2013-04-07 DIAGNOSIS — N410 Acute prostatitis: Secondary | ICD-10-CM

## 2013-04-07 DIAGNOSIS — D229 Melanocytic nevi, unspecified: Secondary | ICD-10-CM

## 2013-04-07 DIAGNOSIS — Z1283 Encounter for screening for malignant neoplasm of skin: Secondary | ICD-10-CM

## 2013-04-07 DIAGNOSIS — D239 Other benign neoplasm of skin, unspecified: Secondary | ICD-10-CM

## 2013-04-07 MED ORDER — CIPROFLOXACIN HCL 500 MG PO TABS: 500.0000 mg | ORAL_TABLET | Freq: Two times a day (BID) | ORAL | Status: DC

## 2013-04-07 NOTE — Patient Instructions (Signed)
Leave the dressing in place until you've showered. Place a dab of Neosporin and a new bandage until a scab appears over the biopsy site. After the scab appears there is no need to keep a dressing in place unless the area can be irritated by clothing. Call our office if redness around the biopsy site appears and begins to spread more than a half inch from the site.  

## 2013-04-07 NOTE — Progress Notes (Signed)
  Subjective:    Patient ID: Jonathan Young, male    DOB: Jun 08, 1951, 62 y.o.   MRN: [id number]  HPI   patient is a 62 year old male who presents for a skin surveyhe has a history of prior skin biopsies that were positive for premalignant changes that were done by his dermatologist.  He has a suspicious mole in his chest that he has noticed that has changed in size and coloration. He has urethral itching/urethritis with a history of prostatitis  Review of Systems  Constitutional: Negative for fever and fatigue.  HENT: Negative for hearing loss, congestion, neck pain and postnasal drip.   Eyes: Negative for discharge, redness and visual disturbance.  Respiratory: Negative for cough, shortness of breath and wheezing.   Cardiovascular: Negative for leg swelling.  Gastrointestinal: Negative for abdominal pain, constipation and abdominal distention.  Genitourinary: Positive for frequency. Negative for urgency.       Urethral irritation  Musculoskeletal: Negative for joint swelling and arthralgias.  Skin: Negative for color change and rash.  Neurological: Negative for weakness and light-headedness.  Hematological: Negative for adenopathy.  Psychiatric/Behavioral: Negative for behavioral problems.       Objective:   Physical Exam Prior signs are stable he was afebrile HEENT was within normal limits neck was supple lung fields were clear heart examination: Regular rate and rhythm examination of his skin identified 2 suspicious lesions one on his left lower flank on the back and one on his left anterior chest above the left nipple was removed by shave biopsy       Assessment & Plan:  Acute urethritis treat with Cipro 500 mg by mouth twice a day for 10 days Informed consent was given by the patient for a shave biopsy. The site(S) was prepped with Betadine and using a 15 blade  two 1.1 cm shave biopsies were obtained. The specimin (s) were placed in preservative and sent for pathology.  Hemostasis was achieved with a compression. Wound care was discussed with the patient. The patient was informed that it would be one to 2 weeks before the pathology will be interpreted.

## 2013-04-07 NOTE — Addendum Note (Signed)
Addended by: Willy Eddy on: 04/07/2013 04:35 PM   Modules accepted: Orders

## 2013-05-17 ENCOUNTER — Other Ambulatory Visit: Payer: Self-pay | Admitting: Internal Medicine

## 2013-08-12 ENCOUNTER — Other Ambulatory Visit: Payer: Self-pay | Admitting: Internal Medicine

## 2013-08-12 ENCOUNTER — Encounter: Payer: Self-pay | Admitting: Internal Medicine

## 2013-08-12 ENCOUNTER — Telehealth: Payer: Self-pay | Admitting: Internal Medicine

## 2013-08-12 NOTE — Telephone Encounter (Signed)
Already done

## 2013-08-12 NOTE — Telephone Encounter (Signed)
Pt request refill 30 day  Ea 1 /day CRESTOR 10 MG tablet pantoprazole (PROTONIX) 40 MG tablet meloxicam (MOBIC) 15 MG tablet (this is new script at this pharm) [facility], [facility]  !![place] !!

## 2013-08-25 ENCOUNTER — Telehealth: Payer: Self-pay | Admitting: Internal Medicine

## 2013-08-25 MED ORDER — MELOXICAM 15 MG PO TABS: ORAL_TABLET | ORAL | Status: DC

## 2013-08-25 NOTE — Telephone Encounter (Signed)
Patient requesting refill of meloxicam (MOBIC) 15 MG tablet. Please send to [facility], no longer using [facility] Please submit ASAP patient states he is out of meds.

## 2013-08-25 NOTE — Telephone Encounter (Signed)
rx sent in electronically 

## 2013-09-17 ENCOUNTER — Other Ambulatory Visit: Payer: Self-pay | Admitting: Internal Medicine

## 2013-10-12 ENCOUNTER — Other Ambulatory Visit: Payer: Self-pay | Admitting: Internal Medicine

## 2013-12-05 ENCOUNTER — Other Ambulatory Visit: Payer: Self-pay | Admitting: Internal Medicine

## 2014-01-13 ENCOUNTER — Other Ambulatory Visit: Payer: Self-pay | Admitting: Internal Medicine

## 2014-01-25 ENCOUNTER — Other Ambulatory Visit: Payer: Self-pay | Admitting: Internal Medicine

## 2014-03-04 ENCOUNTER — Other Ambulatory Visit (INDEPENDENT_AMBULATORY_CARE_PROVIDER_SITE_OTHER): Payer: BC Managed Care – PPO

## 2014-03-04 DIAGNOSIS — Z0000 Encounter for general adult medical examination without abnormal findings: Secondary | ICD-10-CM

## 2014-03-04 LAB — HEPATIC FUNCTION PANEL
ALT: 48 U/L (ref 0–53)
AST: 41 U/L — ABNORMAL HIGH (ref 0–37)
Albumin: 4.2 g/dL (ref 3.5–5.2)
Alkaline Phosphatase: 57 U/L (ref 39–117)
Bilirubin, Direct: 0.1 mg/dL (ref 0.0–0.3)
Total Bilirubin: 0.6 mg/dL (ref 0.3–1.2)
Total Protein: 6.5 g/dL (ref 6.0–8.3)

## 2014-03-04 LAB — CBC WITH DIFFERENTIAL/PLATELET
Basophils Absolute: 0 10*3/uL (ref 0.0–0.1)
Basophils Relative: 0.6 % (ref 0.0–3.0)
Eosinophils Absolute: 0.3 10*3/uL (ref 0.0–0.7)
Eosinophils Relative: 5.2 % — ABNORMAL HIGH (ref 0.0–5.0)
HEMATOCRIT: 44.8 % (ref 39.0–52.0)
Hemoglobin: 14.9 g/dL (ref 13.0–17.0)
LYMPHS ABS: 2.1 10*3/uL (ref 0.7–4.0)
Lymphocytes Relative: 36.4 % (ref 12.0–46.0)
MCHC: 33.3 g/dL (ref 30.0–36.0)
MCV: 93.9 fl (ref 78.0–100.0)
MONO ABS: 0.5 10*3/uL (ref 0.1–1.0)
Monocytes Relative: 8.6 % (ref 3.0–12.0)
Neutro Abs: 2.9 10*3/uL (ref 1.4–7.7)
Neutrophils Relative %: 49.2 % (ref 43.0–77.0)
Platelets: 233 10*3/uL (ref 150.0–400.0)
RBC: 4.77 Mil/uL (ref 4.22–5.81)
RDW: 12.8 % (ref 11.5–14.6)
WBC: 5.8 10*3/uL (ref 4.5–10.5)

## 2014-03-04 LAB — BASIC METABOLIC PANEL
BUN: 17 mg/dL (ref 6–23)
CHLORIDE: 106 meq/L (ref 96–112)
CO2: 25 meq/L (ref 19–32)
Calcium: 9.4 mg/dL (ref 8.4–10.5)
Creatinine, Ser: 1.1 mg/dL (ref 0.4–1.5)
GFR: 71.07 mL/min (ref >60.00)
Glucose, Bld: 98 mg/dL (ref 70–99)
POTASSIUM: 4.7 meq/L (ref 3.5–5.1)
Sodium: 140 mEq/L (ref 135–145)

## 2014-03-04 LAB — LIPID PANEL
CHOL/HDL RATIO: 4
Cholesterol: 164 mg/dL (ref 0–200)
HDL: 40.7 mg/dL (ref >39.00)
LDL Cholesterol: 88 mg/dL (ref 0–99)
Triglycerides: 175 mg/dL — ABNORMAL HIGH (ref 0.0–149.0)
VLDL: 35 mg/dL (ref 0.0–40.0)

## 2014-03-04 LAB — POCT URINALYSIS DIPSTICK
Bilirubin, UA: NEGATIVE
Blood, UA: NEGATIVE
GLUCOSE UA: NEGATIVE
Ketones, UA: NEGATIVE
LEUKOCYTES UA: NEGATIVE
NITRITE UA: NEGATIVE
PH UA: 5.5
Protein, UA: NEGATIVE
Spec Grav, UA: 1.02
Urobilinogen, UA: 0.2

## 2014-03-04 LAB — PSA: PSA: 4.16 ng/mL — AB (ref 0.10–4.00)

## 2014-03-04 LAB — TSH: TSH: 1.94 u[IU]/mL (ref 0.35–5.50)

## 2014-03-11 ENCOUNTER — Encounter: Payer: Self-pay | Admitting: Internal Medicine

## 2014-03-11 ENCOUNTER — Ambulatory Visit (INDEPENDENT_AMBULATORY_CARE_PROVIDER_SITE_OTHER): Payer: BC Managed Care – PPO | Admitting: Internal Medicine

## 2014-03-11 VITALS — Ht 73.0 in

## 2014-03-11 DIAGNOSIS — Z0000 Encounter for general adult medical examination without abnormal findings: Secondary | ICD-10-CM

## 2014-03-11 MED ORDER — LEVOFLOXACIN 500 MG PO TABS: 500.0000 mg | ORAL_TABLET | Freq: Every day | ORAL | Status: DC

## 2014-03-11 NOTE — Progress Notes (Signed)
Pre visit review using our clinic review tool, if applicable. No additional management support is needed unless otherwise documented below in the visit note/SLS  

## 2014-03-11 NOTE — Patient Instructions (Signed)
The patient is instructed to continue all medications as prescribed. Schedule followup with check out clerk upon leaving the clinic  

## 2014-03-11 NOTE — Progress Notes (Signed)
Subjective:    Patient ID: Jonathan Young, male    DOB: 15-Mar-1951, 63 y.o.   MRN: [id number]  HPI CPX   Review of Systems  Constitutional: Negative for fever and fatigue.  HENT: Negative for congestion, hearing loss and postnasal drip.   Eyes: Negative for discharge, redness and visual disturbance.  Respiratory: Negative for cough, shortness of breath and wheezing.   Cardiovascular: Negative for leg swelling.  Gastrointestinal: Negative for abdominal pain, constipation and abdominal distention.  Genitourinary: Negative for urgency and frequency.  Musculoskeletal: Negative for arthralgias, joint swelling and neck pain.  Skin: Negative for color change and rash.  Neurological: Negative for weakness and light-headedness.  Hematological: Negative for adenopathy.  Psychiatric/Behavioral: Negative for behavioral problems.   Past Medical History  Diagnosis Date  . Hypercholesteremia   . Osteoarthritis   . Dysrhythmia     PVC'S  . GERD (gastroesophageal reflux disease)   . Sleep apnea     STOP BANG SCORE 4    History   Social History  . Marital Status: Single    Spouse Name: N/A    Number of Children: N/A  . Years of Education: N/A   Occupational History  . Not on file.   Social History Main Topics  . Smoking status: Former Smoker    Quit date: 05/27/1981  . Smokeless tobacco: Never Used  . Alcohol Use: 7.0 oz/week    14 drink(s) per week  . Drug Use: No  . Sexual Activity: Not on file   Other Topics Concern  . Not on file   Social History Narrative  . No narrative on file    Past Surgical History  Procedure Laterality Date  . Total hip arthroplasty      RT TOAL HIP  . Hemorrhoid surgery  1982  . Tonsillectomy and adenoidectomy    . Shoulder surgery      BIL  . Total hip arthroplasty  06/01/2012    Procedure: TOTAL HIP ARTHROPLASTY ANTERIOR APPROACH;  Surgeon: Mauri Pole, MD;  Location: [facility];  Service: Orthopedics;  Laterality: Left;    Family  History  Problem Relation Age of Onset  . Emphysema Mother     Allergies  Allergen Reactions  . Ezetimibe-Simvastatin Other (See Comments)    "Thought I was loosing my mind"    Current Outpatient Prescriptions on File Prior to Visit  Medication Sig Dispense Refill  . ANDROGEL PUMP 20.25 MG/ACT (1.62%) GEL APPLY 1 PUMP ONCE DAILY AS DIRECTED BY YOUR DOCTOR  75 g  5  . CIALIS 20 MG tablet TAKE 1 TABLET BY MOUTH ONE HOUR BEFORE SEXUAL ACTIVITY; DO NOT EXCEED 1 DOSE IN 24 HOURS !  10 tablet  5  . Coenzyme Q10 (COQ-10) 100 MG capsule Take 100 mg by mouth daily.        . CRESTOR 10 MG tablet take 1 tablet by mouth once daily  90 tablet  3  . Cyanocobalamin (B-12 DOTS) 500 MCG TBDP Take 500 mcg by mouth daily.        . fish oil-omega-3 fatty acids 1000 MG capsule Take 1 g by mouth daily.      Marland Kitchen LORazepam (ATIVAN) 1 MG tablet take 1 tablet by mouth once daily if needed  30 tablet  5  . meloxicam (MOBIC) 15 MG tablet take 1 tablet by mouth once daily  30 tablet  3  . pantoprazole (PROTONIX) 40 MG tablet take 1 tablet by mouth once daily  90  tablet  3  . urea (CARMOL) 40 % CREA Apply 1 application topically daily as needed. For warts on feet.      . valACYclovir (VALTREX) 1000 MG tablet Take 500 mg by mouth daily.       . Vitamin D, Ergocalciferol, (DRISDOL) 50000 UNITS CAPS capsule take 1 capsule by mouth every week  5 capsule  5   No current facility-administered medications on file prior to visit.    Ht 6\' 1"  (1.854 m)       Objective:   Physical Exam  Nursing note and vitals reviewed. Constitutional: He is oriented to person, place, and time. He appears well-developed and well-nourished.  HENT:  Head: Normocephalic and atraumatic.  Eyes: Conjunctivae are normal. Pupils are equal, round, and reactive to light.  Neck: Normal range of motion. Neck supple.  Cardiovascular: Normal rate and regular rhythm.   Pulmonary/Chest: Effort normal and breath sounds normal.  Abdominal: Soft.  Bowel sounds are normal.  Musculoskeletal: Normal range of motion.  Neurological: He is alert and oriented to person, place, and time.  Skin: Skin is warm and dry.  Psychiatric: He has a normal mood and affect. His behavior is normal.          Assessment & Plan:  Patient presents for yearly preventative medicine examination. Medicare questionnaire was completed  All immunizations and health maintenance protocols were reviewed with the patient and needed orders were placed.  Appropriate screening laboratory values were ordered for the patient including screening of hyperlipidemia, renal function and hepatic function. If indicated by BPH, a PSA was ordered.  Medication reconciliation,  past medical history, social history, problem list and allergies were reviewed in detail with the patient  Goals were established with regard to weight loss, exercise, and  diet in compliance with medications  End of life planning was discussed.  Patient has acute and chronic prostatitis recommend Levaquin 500 mg for 14 days

## 2014-04-27 ENCOUNTER — Telehealth: Payer: Self-pay | Admitting: Internal Medicine

## 2014-04-27 NOTE — Telephone Encounter (Signed)
[employer] denied Cialis. Pt must try and fail Viagra first

## 2014-04-28 MED ORDER — SILDENAFIL CITRATE 100 MG PO TABS: 50.0000 mg | ORAL_TABLET | Freq: Every day | ORAL | Status: DC | PRN

## 2014-04-28 NOTE — Telephone Encounter (Signed)
Okay for Rx for Viagra per Dr Arnoldo Morale.

## 2014-05-26 ENCOUNTER — Telehealth: Payer: Self-pay | Admitting: Internal Medicine

## 2014-05-26 ENCOUNTER — Other Ambulatory Visit: Payer: Self-pay | Admitting: Internal Medicine

## 2014-05-26 NOTE — Telephone Encounter (Signed)
Noted  

## 2014-05-26 NOTE — Telephone Encounter (Signed)
Patient Information:  Caller Name: [place]  Phone: [phone]  Patient: Jonathan Young, Jonathan Young  Gender: Male  DOB: 1951/05/20  Age: 63 Years  PCP: Benay Pillow (Adults only, leaving end of July 2015)  Office Follow Up:  Does the office need to follow up with this patient?: Yes  Instructions For The Office: please review, pt would like to talk to provider and not be seen if office if not needed  RN Note:  Pt would like a call back from office to advise treatment.  Made appt for am incase he needs to be seen  Symptoms  Reason For Call & Symptoms: Pt is calling after finding the tick on leg.  It was attached.  Very small.  Unable to determine if he pulled head out.  Does not have any sxs.  No rash.  Small red spot where tick was.  Afebrile  Reviewed Health History In EMR: Yes  Reviewed Medications In EMR: Yes  Reviewed Allergies In EMR: Yes  Reviewed Surgeries / Procedures: Yes  Date of Onset of Symptoms: 05/22/2014  Guideline(s) Used:  Tick Bite  Disposition Per Guideline:   See Today in Office  Reason For Disposition Reached:   Probable deer tick that was attached > 24 hours (or tick appears swollen, not flat)  Advice Given:  N/A  Patient Will Follow Care Advice:  YES  Appointment Scheduled:  05/27/2014 08:00:00 Appointment Scheduled Provider:  Stevie Kern (Family Practice)

## 2014-05-27 ENCOUNTER — Ambulatory Visit (INDEPENDENT_AMBULATORY_CARE_PROVIDER_SITE_OTHER): Payer: BC Managed Care – PPO | Admitting: Physician Assistant

## 2014-05-27 ENCOUNTER — Encounter: Payer: Self-pay | Admitting: Physician Assistant

## 2014-05-27 VITALS — BP 124/80 | HR 74 | Temp 97.6°F | Resp 18 | Wt 206.0 lb

## 2014-05-27 DIAGNOSIS — W57XXXA Bitten or stung by nonvenomous insect and other nonvenomous arthropods, initial encounter: Secondary | ICD-10-CM

## 2014-05-27 DIAGNOSIS — T148 Other injury of unspecified body region: Secondary | ICD-10-CM

## 2014-05-27 NOTE — Progress Notes (Signed)
Pre visit review using our clinic review tool, if applicable. No additional management support is needed unless otherwise documented below in the visit note. 

## 2014-05-27 NOTE — Patient Instructions (Signed)
Continue to watch the area to make sure it is healing appropriately.   If the area becomes more red, more swollen, painful to the touch, excessively warm to the touch, or you develop a fever, return to the clinic as this could represent a skin infection.  Make sure to thoroughly check your body for takes following outdoor activities.  Followup as needed, or if the above symptoms develop, or new symptoms develop.   Tick Bite Information Ticks are insects that attach themselves to the skin. There are many types of ticks. Common types include wood ticks and deer ticks. Sometimes, ticks carry diseases that can make a person very ill. The most common places for ticks to attach themselves are the scalp, neck, armpits, waist, and groin.  HOW CAN YOU PREVENT TICK BITES? Take these steps to help prevent tick bites when you are outdoors:  Wear long sleeves and long pants.  Wear white clothes so you can see ticks more easily.  Tuck your pant legs into your socks.  If walking on a trail, stay in the middle of the trail to avoid brushing against bushes.  Avoid walking through areas with long grass.  Put bug spray on all skin that is showing and along boot tops, pant legs, and sleeve cuffs.  Check clothes, hair, and skin often and before going inside.  Brush off any ticks that are not attached.  Take a shower or bath as soon as possible after being outdoors. HOW SHOULD YOU REMOVE A TICK? Ticks should be removed as soon as possible to help prevent diseases. 1. If latex gloves are available, put them on before trying to remove a tick. 2. Use tweezers to grasp the tick as close to the skin as possible. You may also use curved forceps or a tick removal tool. Grasp the tick as close to its head as possible. Avoid grasping the tick on its body. 3. Pull gently upward until the tick lets go. Do not twist the tick or jerk it suddenly. This may break off the tick's head or mouth parts. 4. Do not squeeze  or crush the tick's body. This could force disease-carrying fluids from the tick into your body. 5. After the tick is removed, wash the bite area and your hands with soap and water or alcohol. 6. Apply a small amount of antiseptic cream or ointment to the bite site. 7. Wash any tools that were used. Do not try to remove a tick by applying a hot match, petroleum jelly, or fingernail polish to the tick. These methods do not work. They may also increase the chances of disease being spread from the tick bite. WHEN SHOULD YOU SEEK HELP? Contact your health care provider if you are unable to remove a tick or if a part of the tick breaks off in the skin. After a tick bite, you need to watch for signs and symptoms of diseases that can be spread by ticks. Contact your health care provider if you develop any of the following:  Fever.  Rash.  Redness and puffiness (swelling) in the area of the tick bite.  Tender, puffy lymph glands.  Watery poop (diarrhea).  Weight loss.  Cough.  Feeling more tired than normal (fatigue).  Muscle, joint, or bone pain.  Belly (abdominal) pain.  Headache.  Change in your level of consciousness.  Trouble walking or moving your legs.  Loss of feeling (numbness) in the legs.  Loss of movement (paralysis).  Shortness of breath.  Confusion.  Throwing up (vomiting) many times. Document Released: 03/05/2010 Document Revised: 08/11/2013 Document Reviewed: 05/19/2013 [facility] Patient Information 2014 [place].

## 2014-05-27 NOTE — Progress Notes (Signed)
Subjective:    Patient ID: Jonathan Young, male    DOB: 18-Jan-1951, 63 y.o.   MRN: [id number]  HPI Pt is a 63 y/o caucasian male presenting for a possible tick bite. Pt states that he noticed a tick bite on Sunday. Noticed a tick on medial upper right thigh. Unsure how long the tick was attached. Has dogs, treats them for ticks. States that he was outside walking property 2 weeks ago, didn't notice any tick bites then, no outdoor activity since then. Doubts he was carrying the tick for longer than 12 hours. The tick wasn't particularly engorged in blood. No increased inflammation.  He denies F/C/N/V/D/SOB/CP/HA.   Review of Systems As per HPI and otherwise negative.    Past Medical History  Diagnosis Date  . Hypercholesteremia   . Osteoarthritis   . Dysrhythmia     PVC'S  . GERD (gastroesophageal reflux disease)   . Sleep apnea     STOP BANG SCORE 4   Past Surgical History  Procedure Laterality Date  . Total hip arthroplasty      RT TOAL HIP  . Hemorrhoid surgery  1982  . Tonsillectomy and adenoidectomy    . Shoulder surgery      BIL  . Total hip arthroplasty  06/01/2012    Procedure: TOTAL HIP ARTHROPLASTY ANTERIOR APPROACH;  Surgeon: Mauri Pole, MD;  Location: [facility];  Service: Orthopedics;  Laterality: Left;    reports that he quit smoking about 33 years ago. He has never used smokeless tobacco. He reports that he drinks about 7 ounces of alcohol per week. He reports that he does not use illicit drugs. family history includes Emphysema in his mother. Allergies  Allergen Reactions  . Ezetimibe-Simvastatin Other (See Comments)    "Thought I was loosing my mind"       Objective:   Physical Exam  Nursing note and vitals reviewed. Constitutional: He is oriented to person, place, and time. He appears well-developed and well-nourished. No distress.  HENT:  Head: Normocephalic and atraumatic.  Eyes: Conjunctivae and EOM are normal. Pupils are equal, round, and  reactive to light.  Neck: Normal range of motion. Neck supple.  Cardiovascular: Normal rate, regular rhythm, normal heart sounds and intact distal pulses.  Exam reveals no gallop and no friction rub.   No murmur heard. Pulmonary/Chest: Effort normal and breath sounds normal. No respiratory distress. He has no wheezes. He has no rales. He exhibits no tenderness.  Musculoskeletal: Normal range of motion.  Neurological: He is alert and oriented to person, place, and time.  Skin: Skin is warm and dry. No rash noted. He is not diaphoretic. There is erythema. No pallor.  Right medial upper thigh: Small, 3 mm diameter area of erythema with central puncture. No visible foreign body. No surrounding erythema, no fluctuance, no excessive warmth, no pain to palpation.  Psychiatric: He has a normal mood and affect. His behavior is normal. Judgment and thought content normal.    Filed Vitals:   05/27/14 0810  BP: 124/80  Pulse: 74  Temp: 97.6 F (36.4 C)  Resp: 18    Lab Results  Component Value Date   WBC 5.8 03/04/2014   HGB 14.9 03/04/2014   HCT 44.8 03/04/2014   PLT 233.0 03/04/2014   GLUCOSE 98 03/04/2014   CHOL 164 03/04/2014   TRIG 175.0* 03/04/2014   HDL 40.70 03/04/2014   LDLDIRECT 112.8 03/02/2013   LDLCALC 88 03/04/2014   ALT 48 03/04/2014  AST 41* 03/04/2014   NA 140 03/04/2014   K 4.7 03/04/2014   CL 106 03/04/2014   CREATININE 1.1 03/04/2014   BUN 17 03/04/2014   CO2 25 03/04/2014   TSH 1.94 03/04/2014   PSA 4.16* 03/04/2014   INR 0.94 05/27/2012   HGBA1C 5.2 07/29/2012        Assessment & Plan:  Jonathan Young was seen today for tick removal.  Diagnoses and associated orders for this visit:  Tick bite Comments: No sign of infection. Tick exposure minimal. No idication for Antibiotics at this time. Pt will monitor the area for worsening signs.    Plan to follow up as needed, or for worsening symptoms.  Patient Instructions  Continue to watch the area to make sure it is healing  appropriately.   If the area becomes more red, more swollen, painful to the touch, excessively warm to the touch, or you develop a fever, return to the clinic as this could represent a skin infection.  Make sure to thoroughly check your body for takes following outdoor activities.  Followup as needed, or if the above symptoms develop, or new symptoms develop.

## 2014-06-23 ENCOUNTER — Other Ambulatory Visit: Payer: Self-pay | Admitting: Internal Medicine

## 2014-06-27 ENCOUNTER — Other Ambulatory Visit: Payer: Self-pay | Admitting: Internal Medicine

## 2014-09-27 ENCOUNTER — Other Ambulatory Visit: Payer: Self-pay | Admitting: Internal Medicine

## 2014-12-01 ENCOUNTER — Other Ambulatory Visit: Payer: Self-pay | Admitting: Family Medicine

## 2014-12-01 MED ORDER — MELOXICAM 15 MG PO TABS: 15.0000 mg | ORAL_TABLET | Freq: Every day | ORAL | Status: DC

## 2014-12-21 ENCOUNTER — Telehealth: Payer: Self-pay | Admitting: Internal Medicine

## 2014-12-21 NOTE — Telephone Encounter (Signed)
States he has spoken with you about establishing care.  Is this ok to do?

## 2014-12-23 NOTE — Telephone Encounter (Signed)
I have said yes to this

## 2014-12-26 NOTE — Telephone Encounter (Signed)
Got scheduled  °

## 2014-12-29 ENCOUNTER — Ambulatory Visit (INDEPENDENT_AMBULATORY_CARE_PROVIDER_SITE_OTHER): Payer: BLUE CROSS/BLUE SHIELD | Admitting: Internal Medicine

## 2014-12-29 ENCOUNTER — Other Ambulatory Visit (INDEPENDENT_AMBULATORY_CARE_PROVIDER_SITE_OTHER): Payer: BLUE CROSS/BLUE SHIELD

## 2014-12-29 ENCOUNTER — Encounter: Payer: Self-pay | Admitting: Internal Medicine

## 2014-12-29 VITALS — BP 124/84 | HR 87 | Temp 97.6°F | Resp 16 | Ht 73.0 in | Wt 206.0 lb

## 2014-12-29 DIAGNOSIS — E538 Deficiency of other specified B group vitamins: Secondary | ICD-10-CM

## 2014-12-29 DIAGNOSIS — Z7251 High risk heterosexual behavior: Secondary | ICD-10-CM

## 2014-12-29 DIAGNOSIS — Z0000 Encounter for general adult medical examination without abnormal findings: Secondary | ICD-10-CM

## 2014-12-29 DIAGNOSIS — Z7252 High risk homosexual behavior: Secondary | ICD-10-CM | POA: Insufficient documentation

## 2014-12-29 DIAGNOSIS — E559 Vitamin D deficiency, unspecified: Secondary | ICD-10-CM

## 2014-12-29 LAB — CBC WITH DIFFERENTIAL/PLATELET
BASOS ABS: 0 10*3/uL (ref 0.0–0.1)
Basophils Relative: 0.5 % (ref 0.0–3.0)
EOS ABS: 0.2 10*3/uL (ref 0.0–0.7)
Eosinophils Relative: 3.3 % (ref 0.0–5.0)
HEMATOCRIT: 44.8 % (ref 39.0–52.0)
Hemoglobin: 14.6 g/dL (ref 13.0–17.0)
LYMPHS ABS: 2.2 10*3/uL (ref 0.7–4.0)
Lymphocytes Relative: 30.8 % (ref 12.0–46.0)
MCHC: 32.7 g/dL (ref 30.0–36.0)
MCV: 94.6 fl (ref 78.0–100.0)
Monocytes Absolute: 0.7 10*3/uL (ref 0.1–1.0)
Monocytes Relative: 9.8 % (ref 3.0–12.0)
NEUTROS PCT: 55.6 % (ref 43.0–77.0)
Neutro Abs: 4 10*3/uL (ref 1.4–7.7)
Platelets: 260 10*3/uL (ref 150.0–400.0)
RBC: 4.73 Mil/uL (ref 4.22–5.81)
RDW: 12.6 % (ref 11.5–15.5)
WBC: 7.2 10*3/uL (ref 4.0–10.5)

## 2014-12-29 LAB — LIPID PANEL
CHOLESTEROL: 195 mg/dL (ref 0–200)
HDL: 40.7 mg/dL (ref >39.00)
LDL Cholesterol: 116 mg/dL — ABNORMAL HIGH (ref 0–99)
NonHDL: 154.3
TRIGLYCERIDES: 193 mg/dL — AB (ref 0.0–149.0)
Total CHOL/HDL Ratio: 5
VLDL: 38.6 mg/dL (ref 0.0–40.0)

## 2014-12-29 LAB — URINALYSIS, ROUTINE W REFLEX MICROSCOPIC
Bilirubin Urine: NEGATIVE
Ketones, ur: NEGATIVE
Leukocytes, UA: NEGATIVE
Nitrite: NEGATIVE
RBC / HPF: NONE SEEN (ref 0–?)
SPECIFIC GRAVITY, URINE: 1.015 (ref 1.000–1.030)
TOTAL PROTEIN, URINE-UPE24: NEGATIVE
URINE GLUCOSE: NEGATIVE
Urobilinogen, UA: 0.2 (ref 0.0–1.0)
WBC UA: NONE SEEN (ref 0–?)
pH: 5.5 (ref 5.0–8.0)

## 2014-12-29 LAB — COMPREHENSIVE METABOLIC PANEL
ALK PHOS: 57 U/L (ref 39–117)
ALT: 37 U/L (ref 0–53)
AST: 35 U/L (ref 0–37)
Albumin: 4.6 g/dL (ref 3.5–5.2)
BUN: 22 mg/dL (ref 6–23)
CO2: 23 mEq/L (ref 19–32)
CREATININE: 1.5 mg/dL (ref 0.4–1.5)
Calcium: 9.3 mg/dL (ref 8.4–10.5)
Chloride: 104 mEq/L (ref 96–112)
GFR: 51.26 mL/min — AB (ref >60.00)
Glucose, Bld: 90 mg/dL (ref 70–99)
Potassium: 4.4 mEq/L (ref 3.5–5.1)
SODIUM: 137 meq/L (ref 135–145)
Total Bilirubin: 0.6 mg/dL (ref 0.2–1.2)
Total Protein: 7.3 g/dL (ref 6.0–8.3)

## 2014-12-29 LAB — PSA: PSA: 4.96 ng/mL — ABNORMAL HIGH (ref 0.10–4.00)

## 2014-12-29 LAB — VITAMIN B12: VITAMIN B 12: 910 pg/mL (ref 211–911)

## 2014-12-29 LAB — TSH: TSH: 1.36 u[IU]/mL (ref 0.35–4.50)

## 2014-12-29 MED ORDER — EMTRICITABINE-TENOFOVIR DF 200-300 MG PO TABS: 1.0000 | ORAL_TABLET | Freq: Every day | ORAL | Status: DC

## 2014-12-29 NOTE — Progress Notes (Signed)
Subjective:    Patient ID: Jonathan Young, male    DOB: 04-17-1951, 64 y.o.   MRN: [id number]  HPI  New to me he comes in today for a CPX but he is also concerned about his risk for HIV - he has about 12 different partners per year and is not consistently safe with oral or anal intercourse, he requests PrEP.  Review of Systems  Constitutional: Negative.  Negative for fever, chills, diaphoresis, appetite change and fatigue.  HENT: Negative.  Negative for sore throat and trouble swallowing.   Eyes: Negative.   Respiratory: Negative.  Negative for cough, choking, chest tightness, shortness of breath and stridor.   Cardiovascular: Negative.  Negative for chest pain, palpitations and leg swelling.  Gastrointestinal: Negative.  Negative for nausea, vomiting, abdominal pain, diarrhea, constipation and blood in stool.  Endocrine: Negative.   Genitourinary: Negative.  Negative for dysuria, urgency, hematuria, flank pain, scrotal swelling, difficulty urinating, genital sores and testicular pain.  Musculoskeletal: Negative.   Skin: Negative.  Negative for rash.  Allergic/Immunologic: Negative.   Neurological: Negative.   Hematological: Negative.  Negative for adenopathy. Does not bruise/bleed easily.  Psychiatric/Behavioral: Positive for sleep disturbance. Negative for suicidal ideas, behavioral problems, self-injury, dysphoric mood and decreased concentration. The patient is nervous/anxious.        Objective:   Physical Exam  Constitutional: He is oriented to person, place, and time. He appears well-developed and well-nourished. No distress.  HENT:  Head: Normocephalic and atraumatic.  Mouth/Throat: Oropharynx is clear and moist. No oropharyngeal exudate.  Eyes: Conjunctivae are normal. Right eye exhibits no discharge. Left eye exhibits no discharge. No scleral icterus.  Neck: Normal range of motion. Neck supple. No JVD present. No tracheal deviation present. No thyromegaly present.    Cardiovascular: Normal rate, regular rhythm, normal heart sounds and intact distal pulses.  Exam reveals no gallop and no friction rub.   No murmur heard. Pulmonary/Chest: Effort normal and breath sounds normal. No stridor. No respiratory distress. He has no wheezes. He has no rales. He exhibits no tenderness.  Abdominal: Soft. Bowel sounds are normal. He exhibits no distension and no mass. There is no tenderness. There is no rebound and no guarding. Hernia confirmed negative in the right inguinal area and confirmed negative in the left inguinal area.  Genitourinary: Rectum normal, testes normal and penis normal. Rectal exam shows no external hemorrhoid, no internal hemorrhoid, no fissure, no mass, no tenderness and anal tone normal. Guaiac negative stool. Prostate is enlarged (1+ smooth symm BPH). Prostate is not tender. Right testis shows no mass, no swelling and no tenderness. Right testis is descended. Left testis shows no mass, no swelling and no tenderness. Left testis is descended. Circumcised. No penile erythema or penile tenderness. No discharge found.  Musculoskeletal: Normal range of motion. He exhibits no edema or tenderness.  Lymphadenopathy:    He has no cervical adenopathy.       Right: No inguinal adenopathy present.       Left: No inguinal adenopathy present.  Neurological: He is oriented to person, place, and time.  Skin: Skin is warm and dry. No rash noted. He is not diaphoretic. No erythema. No pallor.  Psychiatric: He has a normal mood and affect. His behavior is normal. Judgment and thought content normal.  Vitals reviewed.     Lab Results  Component Value Date   WBC 5.8 03/04/2014   HGB 14.9 03/04/2014   HCT 44.8 03/04/2014   PLT 233.0 03/04/2014  GLUCOSE 98 03/04/2014   CHOL 164 03/04/2014   TRIG 175.0* 03/04/2014   HDL 40.70 03/04/2014   LDLDIRECT 112.8 03/02/2013   LDLCALC 88 03/04/2014   ALT 48 03/04/2014   AST 41* 03/04/2014   NA 140 03/04/2014   K 4.7  03/04/2014   CL 106 03/04/2014   CREATININE 1.1 03/04/2014   BUN 17 03/04/2014   CO2 25 03/04/2014   TSH 1.94 03/04/2014   PSA 4.16* 03/04/2014   INR 0.94 05/27/2012   HGBA1C 5.2 07/29/2012      Assessment & Plan:

## 2014-12-29 NOTE — Patient Instructions (Signed)
Safe Sex Safe sex is about reducing the risk of giving or getting a sexually transmitted disease (STD). STDs are spread through sexual contact involving the genitals, mouth, or rectum. Some STDs can be cured and others cannot. Safe sex can also prevent unintended pregnancies.  WHAT ARE SOME SAFE SEX PRACTICES?  Limit your sexual activity to only one partner who is having sex with only you.  Talk to your partner about his or her past partners, past STDs, and drug use.  Use a condom every time you have sexual intercourse. This includes vaginal, oral, and anal sexual activity. Both females and males should wear condoms during oral sex. Only use latex or polyurethane condoms and water-based lubricants. Using petroleum-based lubricants or oils to lubricate a condom will weaken the condom and increase the chance that it will break. The condom should be in place from the beginning to the end of sexual activity. Wearing a condom reduces, but does not completely eliminate, your risk of getting or giving an STD. STDs can be spread by contact with infected body fluids and skin.  Get vaccinated for hepatitis B and HPV.  Avoid alcohol and recreational drugs, which can affect your judgment. You may forget to use a condom or participate in high-risk sex.  For females, avoid douching after sexual intercourse. Douching can spread an infection farther into the reproductive tract.  Check your body for signs of sores, blisters, rashes, or unusual discharge. See your health care provider if you notice any of these signs.  Avoid sexual contact if you have symptoms of an infection or are being treated for an STD. If you or your partner has herpes, avoid sexual contact when blisters are present. Use condoms at all other times.  If you are at risk of being infected with HIV, it is recommended that you take a prescription medicine daily to prevent HIV infection. This is called pre-exposure prophylaxis (PrEP). You are  considered at risk if:  You are a man who has sex with other men (MSM).  You are a heterosexual man or woman who is sexually active with more than one partner.  You take drugs by injection.  You are sexually active with a partner who has HIV.  Talk with your health care provider about whether you are at high risk of being infected with HIV. If you choose to begin PrEP, you should first be tested for HIV. You should then be tested every 3 months for as long as you are taking PrEP.  See your health care provider for regular screenings, exams, and tests for other STDs. Before having sex with a new partner, each of you should be screened for STDs and should talk about the results with each other. WHAT ARE THE BENEFITS OF SAFE SEX?   There is less chance of getting or giving an STD.  You can prevent unwanted or unintended pregnancies.  By discussing safe sex concerns with your partner, you may increase feelings of intimacy, comfort, trust, and honesty between the two of you. Document Released: 01/16/2005 Document Revised: 04/25/2014 Document Reviewed: 06/01/2012 [facility] Patient Information 2015 [place], [place]. This information is not intended to replace advice given to you by your health care provider. Make sure you discuss any questions you have with your health care provider. Health Maintenance A healthy lifestyle and preventative care can promote health and wellness.  Maintain regular health, dental, and eye exams.  Eat a healthy diet. Foods like vegetables, fruits, whole grains, low-fat dairy products, and  lean protein foods contain the nutrients you need and are low in calories. Decrease your intake of foods high in solid fats, added sugars, and salt. Get information about a proper diet from your health care provider, if necessary.  Regular physical exercise is one of the most important things you can do for your health. Most adults should get at least 150 minutes of moderate-intensity  exercise (any activity that increases your heart rate and causes you to sweat) each week. In addition, most adults need muscle-strengthening exercises on 2 or more days a week.   Maintain a healthy weight. The body mass index (BMI) is a screening tool to identify possible weight problems. It provides an estimate of body fat based on height and weight. Your health care provider can find your BMI and can help you achieve or maintain a healthy weight. For males 20 years and older:  A BMI below 18.5 is considered underweight.  A BMI of 18.5 to 24.9 is normal.  A BMI of 25 to 29.9 is considered overweight.  A BMI of 30 and above is considered obese.  Maintain normal blood lipids and cholesterol by exercising and minimizing your intake of saturated fat. Eat a balanced diet with plenty of fruits and vegetables. Blood tests for lipids and cholesterol should begin at age 60 and be repeated every 5 years. If your lipid or cholesterol levels are high, you are over age 56, or you are at high risk for heart disease, you may need your cholesterol levels checked more frequently.Ongoing high lipid and cholesterol levels should be treated with medicines if diet and exercise are not working.  If you smoke, find out from your health care provider how to quit. If you do not use tobacco, do not start.  Lung cancer screening is recommended for adults aged 18-80 years who are at high risk for developing lung cancer because of a history of smoking. A yearly low-dose CT scan of the lungs is recommended for people who have at least a 30-pack-year history of smoking and are current smokers or have quit within the past 15 years. A pack year of smoking is smoking an average of 1 pack of cigarettes a day for 1 year (for example, a 30-pack-year history of smoking could mean smoking 1 pack a day for 30 years or 2 packs a day for 15 years). Yearly screening should continue until the smoker has stopped smoking for at least 15  years. Yearly screening should be stopped for people who develop a health problem that would prevent them from having lung cancer treatment.  If you choose to drink alcohol, do not have more than 2 drinks per day. One drink is considered to be 12 oz (360 mL) of beer, 5 oz (150 mL) of wine, or 1.5 oz (45 mL) of liquor.  Avoid the use of street drugs. Do not share needles with anyone. Ask for help if you need support or instructions about stopping the use of drugs.  High blood pressure causes heart disease and increases the risk of stroke. Blood pressure should be checked at least every 1-2 years. Ongoing high blood pressure should be treated with medicines if weight loss and exercise are not effective.  If you are 16-49 years old, ask your health care provider if you should take aspirin to prevent heart disease.  Diabetes screening involves taking a blood sample to check your fasting blood sugar level. This should be done once every 3 years after age 13 if  you are at a normal weight and without risk factors for diabetes. Testing should be considered at a younger age or be carried out more frequently if you are overweight and have at least 1 risk factor for diabetes.  Colorectal cancer can be detected and often prevented. Most routine colorectal cancer screening begins at the age of 61 and continues through age 41. However, your health care provider may recommend screening at an earlier age if you have risk factors for colon cancer. On a yearly basis, your health care provider may provide home test kits to check for hidden blood in the stool. A small camera at the end of a tube may be used to directly examine the colon (sigmoidoscopy or colonoscopy) to detect the earliest forms of colorectal cancer. Talk to your health care provider about this at age 59 when routine screening begins. A direct exam of the colon should be repeated every 5-10 years through age 26, unless early forms of precancerous polyps or  small growths are found.  People who are at an increased risk for hepatitis B should be screened for this virus. You are considered at high risk for hepatitis B if:  You were born in a country where hepatitis B occurs often. Talk with your health care provider about which countries are considered high risk.  Your parents were born in a high-risk country and you have not received a shot to protect against hepatitis B (hepatitis B vaccine).  You have HIV or AIDS.  You use needles to inject street drugs.  You live with, or have sex with, someone who has hepatitis B.  You are a man who has sex with other men (MSM).  You get hemodialysis treatment.  You take certain medicines for conditions like cancer, organ transplantation, and autoimmune conditions.  Hepatitis C blood testing is recommended for all people born from [birth year] through 1965 and any individual with known risk factors for hepatitis C.  Healthy men should no longer receive prostate-specific antigen (PSA) blood tests as part of routine cancer screening. Talk to your health care provider about prostate cancer screening.  Testicular cancer screening is not recommended for adolescents or adult males who have no symptoms. Screening includes self-exam, a health care provider exam, and other screening tests. Consult with your health care provider about any symptoms you have or any concerns you have about testicular cancer.  Practice safe sex. Use condoms and avoid high-risk sexual practices to reduce the spread of sexually transmitted infections (STIs).  You should be screened for STIs, including gonorrhea and chlamydia if:  You are sexually active and are younger than 24 years.  You are older than 24 years, and your health care provider tells you that you are at risk for this type of infection.  Your sexual activity has changed since you were last screened, and you are at an increased risk for chlamydia or gonorrhea. Ask your health  care provider if you are at risk.  If you are at risk of being infected with HIV, it is recommended that you take a prescription medicine daily to prevent HIV infection. This is called pre-exposure prophylaxis (PrEP). You are considered at risk if:  You are a man who has sex with other men (MSM).  You are a heterosexual man who is sexually active with multiple partners.  You take drugs by injection.  You are sexually active with a partner who has HIV.  Talk with your health care provider about whether you are at  high risk of being infected with HIV. If you choose to begin PrEP, you should first be tested for HIV. You should then be tested every 3 months for as long as you are taking PrEP.  Use sunscreen. Apply sunscreen liberally and repeatedly throughout the day. You should seek shade when your shadow is shorter than you. Protect yourself by wearing long sleeves, pants, a wide-brimmed hat, and sunglasses year round whenever you are outdoors.  Tell your health care provider of new moles or changes in moles, especially if there is a change in shape or color. Also, tell your health care provider if a mole is larger than the size of a pencil eraser.  A one-time screening for abdominal aortic aneurysm (AAA) and surgical repair of large AAAs by ultrasound is recommended for men aged 24-75 years who are current or former smokers.  Stay current with your vaccines (immunizations). Document Released: 06/06/2008 Document Revised: 12/14/2013 Document Reviewed: 05/06/2011 [facility] Patient Information 2015 [place], [place]. This information is not intended to replace advice given to you by your health care provider. Make sure you discuss any questions you have with your health care provider.

## 2014-12-29 NOTE — Progress Notes (Signed)
Pre visit review using our clinic review tool, if applicable. No additional management support is needed unless otherwise documented below in the visit note. 

## 2014-12-30 LAB — HIV ANTIBODY (ROUTINE TESTING W REFLEX): HIV 1&2 Ab, 4th Generation: NONREACTIVE

## 2014-12-30 LAB — RPR

## 2014-12-30 NOTE — Assessment & Plan Note (Signed)
His B12 level is normal now 

## 2014-12-30 NOTE — Assessment & Plan Note (Signed)
HIV and RPR are neg He is immune to Hep A and B Will start truvada and will monitor renal function closely He was advised on safe sex practices

## 2014-12-30 NOTE — Assessment & Plan Note (Signed)
Exam done Vaccines were reviewed and updated Labs ordered Pt ed material was given 

## 2014-12-31 LAB — HEPATITIS C ANTIBODY: HCV Ab: NEGATIVE

## 2014-12-31 LAB — HEPATITIS B SURFACE ANTIBODY,QUALITATIVE: Hep B S Ab: POSITIVE — AB

## 2014-12-31 LAB — HEPATITIS A ANTIBODY, TOTAL: Hep A Total Ab: REACTIVE — AB

## 2015-01-01 ENCOUNTER — Encounter: Payer: Self-pay | Admitting: Internal Medicine

## 2015-01-04 ENCOUNTER — Telehealth: Payer: Self-pay | Admitting: Internal Medicine

## 2015-01-04 NOTE — Telephone Encounter (Signed)
Pt called in and said that pharmacy sent over a pa for his   emtricitabine-tenofovir (TRUVADA) 200-300 MG per tablet [[id number]]    Pt just called in inquiring about the refill and the PA

## 2015-01-05 NOTE — Telephone Encounter (Signed)
Patient notified

## 2015-01-05 NOTE — Telephone Encounter (Signed)
PA information submitted to [facility], decision now pending

## 2015-01-10 ENCOUNTER — Encounter: Payer: Self-pay | Admitting: Internal Medicine

## 2015-01-10 ENCOUNTER — Other Ambulatory Visit: Payer: Self-pay | Admitting: Internal Medicine

## 2015-01-10 DIAGNOSIS — R972 Elevated prostate specific antigen [PSA]: Secondary | ICD-10-CM | POA: Insufficient documentation

## 2015-01-12 ENCOUNTER — Telehealth: Payer: Self-pay | Admitting: Internal Medicine

## 2015-01-12 NOTE — Telephone Encounter (Signed)
Pt request phone call from the assistant explain the lab result (not really understand). Pt also stated that [facility] still waiting for the approval from our office for emtricitabine-tenofovir (TRUVADA) 200-300 MG per tablet. Please call pt

## 2015-01-16 NOTE — Telephone Encounter (Signed)
Spoke to pt and went over the lab results and my chart utilities.   Pt would like to see Wilburn Cornelia, MD for the urology referral.

## 2015-01-18 NOTE — Telephone Encounter (Signed)
Dr Junious Silk 02/09/15 @ [facility] [place], [place] [id number] [phone] Pt aware of appt date time and location

## 2015-01-27 ENCOUNTER — Telehealth: Payer: Self-pay | Admitting: Internal Medicine

## 2015-01-27 MED ORDER — ROSUVASTATIN CALCIUM 10 MG PO TABS: 10.0000 mg | ORAL_TABLET | Freq: Every day | ORAL | Status: DC

## 2015-01-27 NOTE — Telephone Encounter (Signed)
Pt request refill for CRESTOR 10 MG tablet and pantoprazole (PROTONIX) 40 MG tablet to be send to [facility].

## 2015-01-30 ENCOUNTER — Telehealth: Payer: Self-pay | Admitting: Internal Medicine

## 2015-01-30 MED ORDER — PANTOPRAZOLE SODIUM 40 MG PO TBEC: 40.0000 mg | DELAYED_RELEASE_TABLET | Freq: Every day | ORAL | Status: DC

## 2015-01-30 NOTE — Telephone Encounter (Signed)
Patient states protonix did not get refilled.  Leaving to go out of town in the morning.

## 2015-01-30 NOTE — Telephone Encounter (Signed)
Done

## 2015-01-30 NOTE — Telephone Encounter (Signed)
erx done

## 2015-02-27 ENCOUNTER — Telehealth: Payer: Self-pay | Admitting: Internal Medicine

## 2015-02-27 NOTE — Telephone Encounter (Signed)
Pt called in had some question about his medications.  [facility] [phone]  Per [facility] only place he can get his med filled  [facility]

## 2015-02-28 NOTE — Telephone Encounter (Signed)
[facility] advising message received, need more details.

## 2015-03-01 ENCOUNTER — Encounter: Payer: Self-pay | Admitting: Internal Medicine

## 2015-03-01 ENCOUNTER — Other Ambulatory Visit: Payer: Self-pay | Admitting: Internal Medicine

## 2015-03-01 DIAGNOSIS — Z7251 High risk heterosexual behavior: Secondary | ICD-10-CM

## 2015-03-01 MED ORDER — EMTRICITABINE-TENOFOVIR DF 200-300 MG PO TABS: 1.0000 | ORAL_TABLET | Freq: Every day | ORAL | Status: DC

## 2015-03-01 NOTE — Telephone Encounter (Signed)
Spoke to representative at [facility]. They stated that the only thing that needed to be done is to have rx faxed to [facility]. [phone] is the fax number.

## 2015-03-02 ENCOUNTER — Other Ambulatory Visit: Payer: Self-pay | Admitting: Internal Medicine

## 2015-03-02 DIAGNOSIS — Z7251 High risk heterosexual behavior: Secondary | ICD-10-CM

## 2015-03-02 MED ORDER — EMTRICITABINE-TENOFOVIR DF 200-300 MG PO TABS: 1.0000 | ORAL_TABLET | Freq: Every day | ORAL | Status: DC

## 2015-04-12 ENCOUNTER — Other Ambulatory Visit: Payer: Self-pay

## 2015-04-12 MED ORDER — VITAMIN D (ERGOCALCIFEROL) 1.25 MG (50000 UNIT) PO CAPS: 50000.0000 [IU] | ORAL_CAPSULE | ORAL | Status: DC

## 2015-04-21 ENCOUNTER — Telehealth: Payer: Self-pay | Admitting: Internal Medicine

## 2015-04-21 MED ORDER — VITAMIN D (ERGOCALCIFEROL) 1.25 MG (50000 UNIT) PO CAPS: 50000.0000 [IU] | ORAL_CAPSULE | ORAL | Status: DC

## 2015-04-21 MED ORDER — MELOXICAM 15 MG PO TABS: 15.0000 mg | ORAL_TABLET | Freq: Every day | ORAL | Status: DC

## 2015-04-21 MED ORDER — TADALAFIL 20 MG PO TABS: ORAL_TABLET | ORAL | Status: DC

## 2015-04-21 NOTE — Telephone Encounter (Signed)
Rx refills approved

## 2015-04-21 NOTE — Telephone Encounter (Signed)
Patient is requesting refill request on Vit D, meloxicam and cialis.  Patient uses [facility] at [employer].  Patient is currently out of VIt D.

## 2015-04-25 ENCOUNTER — Telehealth: Payer: Self-pay

## 2015-04-25 NOTE — Telephone Encounter (Signed)
Received pharmacy rejection stating that insurance will not cover cialis without a prior authorization, PA started to [facility] via covermymeds.    information has been submitted to [employer]. [facility] will review the request and fax you a determination directly, typically within 3 business days of your submission once all necessary information is received. If [facility] has not responded in 3 business days or if you have any questions about your submission, contact [facility] at [phone].

## 2015-04-27 ENCOUNTER — Other Ambulatory Visit (INDEPENDENT_AMBULATORY_CARE_PROVIDER_SITE_OTHER): Payer: BLUE CROSS/BLUE SHIELD

## 2015-04-27 ENCOUNTER — Encounter: Payer: Self-pay | Admitting: Internal Medicine

## 2015-04-27 ENCOUNTER — Ambulatory Visit (INDEPENDENT_AMBULATORY_CARE_PROVIDER_SITE_OTHER): Payer: BLUE CROSS/BLUE SHIELD | Admitting: Internal Medicine

## 2015-04-27 VITALS — BP 140/84 | HR 80 | Temp 97.6°F | Ht 73.0 in | Wt 207.8 lb

## 2015-04-27 DIAGNOSIS — R197 Diarrhea, unspecified: Secondary | ICD-10-CM

## 2015-04-27 DIAGNOSIS — K2100 Gastro-esophageal reflux disease with esophagitis, without bleeding: Secondary | ICD-10-CM

## 2015-04-27 DIAGNOSIS — R002 Palpitations: Secondary | ICD-10-CM

## 2015-04-27 LAB — CBC WITH DIFFERENTIAL/PLATELET
BASOS PCT: 0.3 % (ref 0.0–3.0)
Basophils Absolute: 0 10*3/uL (ref 0.0–0.1)
Eosinophils Absolute: 0.3 10*3/uL (ref 0.0–0.7)
Eosinophils Relative: 4.1 % (ref 0.0–5.0)
HCT: 42.4 % (ref 39.0–52.0)
Hemoglobin: 14.2 g/dL (ref 13.0–17.0)
LYMPHS PCT: 26.5 % (ref 12.0–46.0)
Lymphs Abs: 1.7 10*3/uL (ref 0.7–4.0)
MCHC: 33.5 g/dL (ref 30.0–36.0)
MCV: 93.2 fl (ref 78.0–100.0)
Monocytes Absolute: 0.6 10*3/uL (ref 0.1–1.0)
Monocytes Relative: 9.6 % (ref 3.0–12.0)
NEUTROS ABS: 3.8 10*3/uL (ref 1.4–7.7)
NEUTROS PCT: 59.5 % (ref 43.0–77.0)
Platelets: 269 10*3/uL (ref 150.0–400.0)
RBC: 4.55 Mil/uL (ref 4.22–5.81)
RDW: 13.7 % (ref 11.5–15.5)
WBC: 6.5 10*3/uL (ref 4.0–10.5)

## 2015-04-27 LAB — BASIC METABOLIC PANEL
BUN: 17 mg/dL (ref 6–23)
CALCIUM: 9.7 mg/dL (ref 8.4–10.5)
CO2: 28 mEq/L (ref 19–32)
CREATININE: 1.35 mg/dL (ref 0.40–1.50)
Chloride: 105 mEq/L (ref 96–112)
GFR: 56.5 mL/min — AB (ref >60.00)
Glucose, Bld: 94 mg/dL (ref 70–99)
Potassium: 4.7 mEq/L (ref 3.5–5.1)
SODIUM: 138 meq/L (ref 135–145)

## 2015-04-27 NOTE — Patient Instructions (Addendum)
Please take a probiotic , Florastor OR Align, every day if the bowels are loose. This will replace the normal bacteria which  are necessary for formation of normal stool and processing of food.  Reflux of gastric acid may be asymptomatic as this may occur mainly during sleep.The triggers for reflux  include stress; the "aspirin family" ; alcohol; peppermint; and caffeine (coffee, tea, cola, and chocolate). The aspirin family would include aspirin and the nonsteroidal agents such as ibuprofen &  Naproxen. Tylenol would not cause reflux. If having symptoms ; food & drink should be avoided for @ least 2 hours before going to bed.   Take the protein pump inhibitor 30 minutes before breakfast and 30 minutes before the evening meal for 8 weeks then go back to once a day  30 minutes before breakfast for reflux symptoms.

## 2015-04-27 NOTE — Progress Notes (Signed)
Pre visit review using our clinic review tool, if applicable. No additional management support is needed unless otherwise documented below in the visit note. 

## 2015-04-27 NOTE — Progress Notes (Signed)
   Subjective:    Patient ID: Jonathan Young, male    DOB: 01-25-1951, 63 y.o.   MRN: [id number]  HPI  He states that 90 percent of his bowel movements in the last 6 weeks have been frankly watery. This began after he been on Truvada for 2 weeks. He has been off it for one month. There were no other triggers such as dairy or wheat intake. He also denies any travel, animal,vector or suspicious food/liquid intake as a trigger. His had no antibiotics in the last 12 weeks. He has had a past history of Giardia. Imodium AD  as well as colestipol from the urgent care completely resolve this but cause constipation.  He also describes increased stomach "gurgling", belching, and bloating. He has occasional occasional dysphagia with chicken. Nausea is intermittent.  He has history of irritable bowel syndrome.  Additionally describes occasional palpitations.TSH was 1.36 in January    Review of Systems   He denies vomiting; severe dyspepsia; abdominal pain; hematemesis; melena; or rectal bleeding. There's been no associated fever, chills, sweats, weight loss. He also denies lightheadedness or decreased urine flow.     Objective:   Physical Exam General appearance is one of good health and nourishment w/o distress.  Eyes: No conjunctival inflammation or scleral icterus is present.  Oral exam: Dental hygiene is good; lips and gums are healthy appearing.There is no oropharyngeal erythema or exudate noted.   Heart:  Normal rate and regular rhythm. S1 and S2 normal without gallop, murmur,  rub or other extra sounds  . Intermittent soft click at the apex suggested w/o regurgitation  Lungs:Chest clear to auscultation; no wheezes, rhonchi,rales ,or rubs present.No increased work of breathing.   Abdomen: bowel sounds normal, soft and non-tender without masses, organomegaly or hernias noted.  No guarding or rebound . No tenderness over the flanks to percussion  Musculoskeletal: Able to lie flat and sit up  without help. Negative straight leg raising bilaterally. Gait normal  Skin:Warm & dry.  Intact without suspicious lesions or rashes ; no jaundice or tenting  Lymphatic: No lymphadenopathy is noted about the head, neck, axilla               Assessment & Plan:  #1 diarrhea in the context of past medical history of Giardia  #2 GERD with intermittent dysphagia  #3 occasional palpitations with suggestion of intermittent mitral click  Plan: See orders and recommendations

## 2015-04-27 NOTE — Telephone Encounter (Signed)
Cialis denied, pt must try and fail viagra first.

## 2015-04-28 ENCOUNTER — Other Ambulatory Visit: Payer: Self-pay | Admitting: Internal Medicine

## 2015-04-28 ENCOUNTER — Other Ambulatory Visit: Payer: BLUE CROSS/BLUE SHIELD

## 2015-04-28 DIAGNOSIS — R197 Diarrhea, unspecified: Secondary | ICD-10-CM

## 2015-04-29 LAB — C. DIFFICILE GDH AND TOXIN A/B
C. difficile GDH: NOT DETECTED
C. difficile Toxin A/B: NOT DETECTED

## 2015-05-01 ENCOUNTER — Encounter: Payer: Self-pay | Admitting: Internal Medicine

## 2015-05-01 ENCOUNTER — Telehealth: Payer: Self-pay

## 2015-05-01 LAB — GIARDIA/CRYPTOSPORIDIUM (EIA)
Cryptosporidium Screen (EIA): NEGATIVE
Giardia Screen (EIA): POSITIVE

## 2015-05-01 MED ORDER — METRONIDAZOLE 250 MG PO TABS: 250.0000 mg | ORAL_TABLET | Freq: Three times a day (TID) | ORAL | Status: DC

## 2015-05-01 NOTE — Telephone Encounter (Signed)
Phone call to patient. He is checking his mychart  He has been advised of medication being sent to his pharmacy

## 2015-05-01 NOTE — Telephone Encounter (Signed)
-----   Message from Hendricks Limes, MD sent at 05/01/2015  1:11 PM EDT ----- Please verify he retrieved lab results through My Chart. If is not going to utilize it ; please inactivate the account to prevent discontinuity of care. Thanks. Metronidazole 250 mg tid #21 (take for @ least 5 days) for positive Giardiasis reported today. No alcohol while on this medication

## 2015-05-08 ENCOUNTER — Encounter: Payer: Self-pay | Admitting: Internal Medicine

## 2015-09-08 ENCOUNTER — Other Ambulatory Visit: Payer: Self-pay

## 2015-09-08 MED ORDER — MELOXICAM 15 MG PO TABS: 15.0000 mg | ORAL_TABLET | Freq: Every day | ORAL | Status: DC

## 2015-10-02 ENCOUNTER — Encounter: Payer: Self-pay | Admitting: Internal Medicine

## 2015-10-02 LAB — PSA: PSA, FREE: 4.59

## 2015-10-10 ENCOUNTER — Other Ambulatory Visit: Payer: Self-pay | Admitting: Internal Medicine

## 2015-10-10 ENCOUNTER — Encounter: Payer: Self-pay | Admitting: Internal Medicine

## 2015-10-10 ENCOUNTER — Telehealth: Payer: Self-pay | Admitting: Internal Medicine

## 2015-10-10 DIAGNOSIS — Z1211 Encounter for screening for malignant neoplasm of colon: Secondary | ICD-10-CM | POA: Insufficient documentation

## 2015-10-10 DIAGNOSIS — N529 Male erectile dysfunction, unspecified: Secondary | ICD-10-CM

## 2015-10-10 MED ORDER — TADALAFIL 20 MG PO TABS: 20.0000 mg | ORAL_TABLET | Freq: Every day | ORAL | Status: DC | PRN

## 2015-10-10 NOTE — Telephone Encounter (Signed)
Had already scheduled appt.  Left vm for patient to keep appt.

## 2015-10-10 NOTE — Telephone Encounter (Signed)
Per Dr. Ronnald Ramp pt needs to be seen. Schedule an OV please

## 2015-10-10 NOTE — Telephone Encounter (Signed)
Patient states Dr. Ronnald Ramp would like him to come back in for labs.  He would like those labs entered in so he can go to the lab.

## 2015-10-17 ENCOUNTER — Other Ambulatory Visit (INDEPENDENT_AMBULATORY_CARE_PROVIDER_SITE_OTHER): Payer: BLUE CROSS/BLUE SHIELD

## 2015-10-17 ENCOUNTER — Encounter: Payer: Self-pay | Admitting: Internal Medicine

## 2015-10-17 ENCOUNTER — Ambulatory Visit (INDEPENDENT_AMBULATORY_CARE_PROVIDER_SITE_OTHER): Payer: BLUE CROSS/BLUE SHIELD | Admitting: Internal Medicine

## 2015-10-17 VITALS — BP 130/80 | HR 81 | Temp 98.0°F | Resp 16 | Wt 205.0 lb

## 2015-10-17 DIAGNOSIS — Z79899 Other long term (current) drug therapy: Secondary | ICD-10-CM

## 2015-10-17 DIAGNOSIS — Z7251 High risk heterosexual behavior: Secondary | ICD-10-CM | POA: Diagnosis not present

## 2015-10-17 DIAGNOSIS — E538 Deficiency of other specified B group vitamins: Secondary | ICD-10-CM

## 2015-10-17 DIAGNOSIS — Z1211 Encounter for screening for malignant neoplasm of colon: Secondary | ICD-10-CM

## 2015-10-17 DIAGNOSIS — E559 Vitamin D deficiency, unspecified: Secondary | ICD-10-CM | POA: Diagnosis not present

## 2015-10-17 DIAGNOSIS — F411 Generalized anxiety disorder: Secondary | ICD-10-CM | POA: Insufficient documentation

## 2015-10-17 LAB — CBC WITH DIFFERENTIAL/PLATELET
Basophils Absolute: 0.1 10*3/uL (ref 0.0–0.1)
Basophils Relative: 0.8 % (ref 0.0–3.0)
EOS PCT: 6.1 % — AB (ref 0.0–5.0)
Eosinophils Absolute: 0.4 10*3/uL (ref 0.0–0.7)
HCT: 45.1 % (ref 39.0–52.0)
Hemoglobin: 14.9 g/dL (ref 13.0–17.0)
LYMPHS ABS: 2.2 10*3/uL (ref 0.7–4.0)
Lymphocytes Relative: 35.4 % (ref 12.0–46.0)
MCHC: 33.1 g/dL (ref 30.0–36.0)
MCV: 94.8 fl (ref 78.0–100.0)
MONO ABS: 0.5 10*3/uL (ref 0.1–1.0)
Monocytes Relative: 8.2 % (ref 3.0–12.0)
NEUTROS ABS: 3.1 10*3/uL (ref 1.4–7.7)
NEUTROS PCT: 49.5 % (ref 43.0–77.0)
PLATELETS: 230 10*3/uL (ref 150.0–400.0)
RBC: 4.76 Mil/uL (ref 4.22–5.81)
RDW: 14.4 % (ref 11.5–15.5)
WBC: 6.2 10*3/uL (ref 4.0–10.5)

## 2015-10-17 LAB — URINALYSIS, ROUTINE W REFLEX MICROSCOPIC
Bilirubin Urine: NEGATIVE
HGB URINE DIPSTICK: NEGATIVE
Ketones, ur: NEGATIVE
LEUKOCYTES UA: NEGATIVE
Nitrite: NEGATIVE
PH: 6 (ref 5.0–8.0)
RBC / HPF: NONE SEEN (ref 0–?)
Specific Gravity, Urine: <=1.005 — AB (ref 1.000–1.030)
TOTAL PROTEIN, URINE-UPE24: NEGATIVE
URINE GLUCOSE: NEGATIVE
UROBILINOGEN UA: 0.2 (ref 0.0–1.0)
WBC, UA: NONE SEEN (ref 0–?)

## 2015-10-17 LAB — COMPREHENSIVE METABOLIC PANEL
ALT: 24 U/L (ref 0–53)
AST: 25 U/L (ref 0–37)
Albumin: 4.4 g/dL (ref 3.5–5.2)
Alkaline Phosphatase: 57 U/L (ref 39–117)
BUN: 20 mg/dL (ref 6–23)
CALCIUM: 9.7 mg/dL (ref 8.4–10.5)
CHLORIDE: 107 meq/L (ref 96–112)
CO2: 24 meq/L (ref 19–32)
Creatinine, Ser: 1.31 mg/dL (ref 0.40–1.50)
GFR: 58.4 mL/min — AB (ref >60.00)
GLUCOSE: 102 mg/dL — AB (ref 70–99)
POTASSIUM: 4.9 meq/L (ref 3.5–5.1)
Sodium: 141 mEq/L (ref 135–145)
Total Bilirubin: 0.5 mg/dL (ref 0.2–1.2)
Total Protein: 7 g/dL (ref 6.0–8.3)

## 2015-10-17 LAB — VITAMIN D 25 HYDROXY (VIT D DEFICIENCY, FRACTURES): VITD: 69.16 ng/mL (ref 30.00–100.00)

## 2015-10-17 LAB — HIV ANTIBODY (ROUTINE TESTING W REFLEX): HIV: NONREACTIVE

## 2015-10-17 LAB — FOLATE: Folate: 7.9 ng/mL (ref >5.9)

## 2015-10-17 LAB — VITAMIN B12: VITAMIN B 12: 769 pg/mL (ref 211–911)

## 2015-10-17 MED ORDER — LORAZEPAM 0.5 MG PO TABS: 0.5000 mg | ORAL_TABLET | Freq: Two times a day (BID) | ORAL | Status: DC | PRN

## 2015-10-17 NOTE — Patient Instructions (Signed)
Safe Sex Safe sex is about reducing the risk of giving or getting a sexually transmitted disease (STD). STDs are spread through sexual contact involving the genitals, mouth, or rectum. Some STDs can be cured and others cannot. Safe sex can also prevent unintended pregnancies.  WHAT ARE SOME SAFE SEX PRACTICES?  Limit your sexual activity to only one partner who is having sex with only you.  Talk to your partner about his or her past partners, past STDs, and drug use.  Use a condom every time you have sexual intercourse. This includes vaginal, oral, and anal sexual activity. Both females and males should wear condoms during oral sex. Only use latex or polyurethane condoms and water-based lubricants. Using petroleum-based lubricants or oils to lubricate a condom will weaken the condom and increase the chance that it will break. The condom should be in place from the beginning to the end of sexual activity. Wearing a condom reduces, but does not completely eliminate, your risk of getting or giving an STD. STDs can be spread by contact with infected body fluids and skin.  Get vaccinated for hepatitis B and HPV.  Avoid alcohol and recreational drugs, which can affect your judgment. You may forget to use a condom or participate in high-risk sex.  For females, avoid douching after sexual intercourse. Douching can spread an infection farther into the reproductive tract.  Check your body for signs of sores, blisters, rashes, or unusual discharge. See your health care provider if you notice any of these signs.  Avoid sexual contact if you have symptoms of an infection or are being treated for an STD. If you or your partner has herpes, avoid sexual contact when blisters are present. Use condoms at all other times.  If you are at risk of being infected with HIV, it is recommended that you take a prescription medicine daily to prevent HIV infection. This is called pre-exposure prophylaxis (PrEP). You are  considered at risk if:  You are a man who has sex with other men (MSM).  You are a heterosexual man or woman who is sexually active with more than one partner.  You take drugs by injection.  You are sexually active with a partner who has HIV.  Talk with your health care provider about whether you are at high risk of being infected with HIV. If you choose to begin PrEP, you should first be tested for HIV. You should then be tested every 3 months for as long as you are taking PrEP.  See your health care provider for regular screenings, exams, and tests for other STDs. Before having sex with a new partner, each of you should be screened for STDs and should talk about the results with each other. WHAT ARE THE BENEFITS OF SAFE SEX?   There is less chance of getting or giving an STD.  You can prevent unwanted or unintended pregnancies.  By discussing safe sex concerns with your partner, you may increase feelings of intimacy, comfort, trust, and honesty between the two of you.   This information is not intended to replace advice given to you by your health care provider. Make sure you discuss any questions you have with your health care provider.   Document Released: 01/16/2005 Document Revised: 12/30/2014 Document Reviewed: 06/01/2012 Elsevier Interactive Patient Education [employer].

## 2015-10-17 NOTE — Progress Notes (Signed)
Subjective:  Patient ID: Jonathan Young, male    DOB: March 03, 1951  Age: 64 y.o. MRN: [id number]  CC: Follow-up   HPI Jonathan Young presents for f/up - he takes Truvada for PrEP and does not have any side effects or complaints. He has about 3-4 sexual partners per month. He wants to get an updated colonoscopy completed.  Outpatient Prescriptions Prior to Visit  Medication Sig Dispense Refill  . Cyanocobalamin (B-12 DOTS) 500 MCG TBDP Take 500 mcg by mouth daily.      . fish oil-omega-3 fatty acids 1000 MG capsule Take 1 g by mouth daily.    . meloxicam (MOBIC) 15 MG tablet Take 1 tablet (15 mg total) by mouth daily. 30 tablet 3  . pantoprazole (PROTONIX) 40 MG tablet Take 1 tablet (40 mg total) by mouth daily. 90 tablet 3  . rosuvastatin (CRESTOR) 10 MG tablet Take 1 tablet (10 mg total) by mouth daily. 90 tablet 3  . tadalafil (CIALIS) 20 MG tablet Take 1 tablet (20 mg total) by mouth daily as needed for erectile dysfunction. 8 tablet 11  . urea (CARMOL) 40 % CREA Apply 1 application topically daily as needed. For warts on feet.    . valACYclovir (VALTREX) 1000 MG tablet Take 500 mg by mouth daily.     . Vitamin D, Ergocalciferol, (DRISDOL) 50000 UNITS CAPS capsule Take 1 capsule (50,000 Units total) by mouth once a week. 5 capsule 5  . LORazepam (ATIVAN) 1 MG tablet take 1 tablet by mouth once daily if needed 30 tablet 5  . metroNIDAZOLE (FLAGYL) 250 MG tablet Take 1 tablet (250 mg total) by mouth 3 (three) times daily. (Patient not taking: Reported on 10/17/2015) 21 tablet 0   No facility-administered medications prior to visit.    ROS Review of Systems  Constitutional: Negative.  Negative for fever, fatigue and unexpected weight change.  HENT: Negative.   Eyes: Negative.   Respiratory: Negative.  Negative for cough, choking, chest tightness, shortness of breath and stridor.   Cardiovascular: Negative.  Negative for chest pain, palpitations and leg swelling.  Gastrointestinal:  Negative.  Negative for nausea, vomiting, abdominal pain, diarrhea, constipation and blood in stool.  Endocrine: Negative.   Genitourinary: Negative.  Negative for dysuria, hematuria, flank pain and difficulty urinating.  Musculoskeletal: Negative.  Negative for myalgias and arthralgias.  Skin: Negative.  Negative for rash.  Allergic/Immunologic: Negative.   Neurological: Negative.  Negative for dizziness, tremors, weakness and light-headedness.  Hematological: Negative.  Negative for adenopathy. Does not bruise/bleed easily.  Psychiatric/Behavioral: Negative for suicidal ideas, confusion, sleep disturbance and decreased concentration. The patient is nervous/anxious. The patient is not hyperactive.        He has rare episodes of anxiety and wants a refill on ativan    Objective:  BP 130/80 mmHg  Pulse 81  Temp(Src) 98 F (36.7 C) (Oral)  Resp 16  Wt 205 lb (92.987 kg)  SpO2 97%  BP Readings from Last 3 Encounters:  10/17/15 130/80  04/27/15 140/84  12/29/14 124/84    Wt Readings from Last 3 Encounters:  10/17/15 205 lb (92.987 kg)  04/27/15 207 lb 12 oz (94.235 kg)  12/29/14 206 lb (93.441 kg)    Physical Exam  Constitutional: He is oriented to person, place, and time. He appears well-developed and well-nourished. No distress.  HENT:  Head: Normocephalic and atraumatic.  Mouth/Throat: Oropharynx is clear and moist. No oropharyngeal exudate.  Eyes: Conjunctivae are normal. Right eye exhibits no discharge.  Left eye exhibits no discharge. No scleral icterus.  Neck: Normal range of motion. Neck supple. No JVD present. No tracheal deviation present. No thyromegaly present.  Cardiovascular: Normal rate, regular rhythm, normal heart sounds and intact distal pulses.  Exam reveals no gallop and no friction rub.   No murmur heard. Pulmonary/Chest: Effort normal and breath sounds normal. No stridor. No respiratory distress. He has no wheezes. He has no rales. He exhibits no tenderness.   Abdominal: Soft. Bowel sounds are normal. He exhibits no distension and no mass. There is no tenderness. There is no rebound and no guarding.  Musculoskeletal: Normal range of motion. He exhibits no edema or tenderness.  Lymphadenopathy:    He has no cervical adenopathy.  Neurological: He is oriented to person, place, and time.  Skin: Skin is warm. No rash noted. He is not diaphoretic. No erythema. No pallor.  Vitals reviewed.   Lab Results  Component Value Date   WBC 6.2 10/17/2015   HGB 14.9 10/17/2015   HCT 45.1 10/17/2015   PLT 230.0 10/17/2015   GLUCOSE 102* 10/17/2015   CHOL 195 12/29/2014   TRIG 193.0* 12/29/2014   HDL 40.70 12/29/2014   LDLDIRECT 112.8 03/02/2013   LDLCALC 116* 12/29/2014   ALT 24 10/17/2015   AST 25 10/17/2015   NA 141 10/17/2015   K 4.9 10/17/2015   CL 107 10/17/2015   CREATININE 1.31 10/17/2015   BUN 20 10/17/2015   CO2 24 10/17/2015   TSH 1.36 12/29/2014   PSA 4.96* 12/29/2014   INR 0.94 05/27/2012   HGBA1C 5.2 07/29/2012    Nm Bone Scan 3 Phase Lower Extremity  10/20/2012  *RADIOLOGY REPORT* Clinical Data:  Evaluate for possible stress fracture at distal tip of left hip arthroplasty device. NUCLEAR MEDICINE 3-PHASE BONE SCAN Technique:  Radionuclide angiographic images, immediate static blood pool images, and 3-hour delayed static images were obtained of the pelvis and hips after intravenous injection of radiopharmaceutical. Radiopharmaceutical: 25 Technetium 99 MDP Comparison:  None. Findings: On the flow phase portion of the examination there is symmetric radiotracer activity to both hips. On the blood pool phase portion of the examination there is normal and symmetric radiotracer uptake to both hips. On the delayed phase images there is mild, asymmetric increased radiotracer uptake surrounding the femoral component of the left hip arthroplasty device.  There are no specific features identified to suggest stress fracture involving the area  surrounding the distal tip of the left hip arthroplasty device. IMPRESSION: 1.  Nonspecific, asymmetric increased uptake surrounds the left hip arthroplasty device on the delayed phase images only. 2.  No evidence for stress fracture identified. Original Report Authenticated By: Angelita Ingles, M.D.    Assessment & Plan:   Jonathan Young was seen today for follow-up.  Diagnoses and all orders for this visit:  Vitamin B 12 deficiency- CBC and B12 are normal, cont the oral supplement  High risk sexual behavior- safe sex practices were discussed, his BP and renal function are stable, will cont Truvada  Vitamin D deficiency- his Vit D level is normal, will cont the supplement   I have discontinued Jonathan Young LORazepam and metroNIDAZOLE. I am also having him start on LORazepam. Additionally, I am having him maintain his valACYclovir, Cyanocobalamin, urea, fish oil-omega-3 fatty acids, rosuvastatin, pantoprazole, Vitamin D (Ergocalciferol), meloxicam, tadalafil, and emtricitabine-tenofovir.  Meds ordered this encounter  Medications  . emtricitabine-tenofovir (TRUVADA) 200-300 MG tablet    Sig: Take 1 tablet by mouth daily.  Marland Kitchen LORazepam (ATIVAN)  0.5 MG tablet    Sig: Take 1 tablet (0.5 mg total) by mouth 2 (two) times daily as needed for anxiety.    Dispense:  30 tablet    Refill:  3     Follow-up: Return in about 6 months (around 04/16/2016).  Scarlette Calico, MD

## 2015-10-17 NOTE — Progress Notes (Signed)
Pre visit review using our clinic review tool, if applicable. No additional management support is needed unless otherwise documented below in the visit note. 

## 2015-10-18 ENCOUNTER — Encounter: Payer: Self-pay | Admitting: Internal Medicine

## 2015-10-20 ENCOUNTER — Telehealth: Payer: Self-pay

## 2015-10-20 NOTE — Telephone Encounter (Signed)
KEY [id number] awaiting ins response

## 2015-11-07 ENCOUNTER — Encounter: Payer: Self-pay | Admitting: Internal Medicine

## 2015-11-07 DIAGNOSIS — D126 Benign neoplasm of colon, unspecified: Secondary | ICD-10-CM

## 2015-11-08 NOTE — Telephone Encounter (Signed)
Request for appt before the end of the year if possible.

## 2015-11-27 ENCOUNTER — Encounter: Payer: Self-pay | Admitting: Gastroenterology

## 2015-12-28 ENCOUNTER — Telehealth: Payer: Self-pay | Admitting: *Deleted

## 2015-12-28 MED ORDER — VITAMIN D (ERGOCALCIFEROL) 1.25 MG (50000 UNIT) PO CAPS: 50000.0000 [IU] | ORAL_CAPSULE | ORAL | Status: DC

## 2015-12-28 NOTE — Telephone Encounter (Signed)
Receive call pt states [facility] sent refill request for his Vitamin D, but they stated they haven't receive back. Inform per chart no request has been sent for his Vitamin D, but will send rx to [place]...Jonathan Young

## 2016-01-04 ENCOUNTER — Other Ambulatory Visit: Payer: Self-pay

## 2016-01-04 MED ORDER — MELOXICAM 15 MG PO TABS: 15.0000 mg | ORAL_TABLET | Freq: Every day | ORAL | Status: DC

## 2016-01-11 DIAGNOSIS — H43392 Other vitreous opacities, left eye: Secondary | ICD-10-CM | POA: Diagnosis not present

## 2016-01-17 ENCOUNTER — Telehealth: Payer: Self-pay | Admitting: Internal Medicine

## 2016-01-17 NOTE — Telephone Encounter (Signed)
Pt only has a few pills of emtricitabine-tenofovir (TRUVADA) 200-300 MG tablet [id number] . He has plenty of refills from his mail order but they said they won't be able to get in the mail for a few more days. He is hoping you can send a prescription to [facility] on [address]

## 2016-01-18 MED ORDER — EMTRICITABINE-TENOFOVIR DF 200-300 MG PO TABS: 1.0000 | ORAL_TABLET | Freq: Every day | ORAL | Status: DC

## 2016-01-18 NOTE — Telephone Encounter (Signed)
Notified pt rx sent...Jonathan Young

## 2016-01-29 ENCOUNTER — Ambulatory Visit (AMBULATORY_SURGERY_CENTER): Payer: Self-pay

## 2016-01-29 VITALS — Ht 73.0 in | Wt 204.0 lb

## 2016-01-29 DIAGNOSIS — Z860100 Personal history of colon polyps, unspecified: Secondary | ICD-10-CM

## 2016-01-29 MED ORDER — SUPREP BOWEL PREP KIT 17.5-3.13-1.6 GM/177ML PO SOLN: 1.0000 | Freq: Once | ORAL | Status: DC

## 2016-01-29 NOTE — Progress Notes (Signed)
Pt wants MD to check for condyloma (as advised per dermatologist)  No allergies to eggs or soy No past problems with anesthesia No home oxygen No diet/weight loss meds  Has email and internet; refused emmi

## 2016-02-09 ENCOUNTER — Ambulatory Visit (AMBULATORY_SURGERY_CENTER): Payer: PPO | Admitting: Gastroenterology

## 2016-02-09 ENCOUNTER — Encounter: Payer: Self-pay | Admitting: Gastroenterology

## 2016-02-09 VITALS — BP 125/79 | HR 71 | Temp 97.5°F | Resp 15 | Ht 73.0 in | Wt 204.0 lb

## 2016-02-09 DIAGNOSIS — G4730 Sleep apnea, unspecified: Secondary | ICD-10-CM | POA: Diagnosis not present

## 2016-02-09 DIAGNOSIS — Z860100 Personal history of colon polyps, unspecified: Secondary | ICD-10-CM

## 2016-02-09 DIAGNOSIS — D122 Benign neoplasm of ascending colon: Secondary | ICD-10-CM | POA: Diagnosis not present

## 2016-02-09 DIAGNOSIS — Z1211 Encounter for screening for malignant neoplasm of colon: Secondary | ICD-10-CM | POA: Diagnosis not present

## 2016-02-09 LAB — HM COLONOSCOPY

## 2016-02-09 MED ORDER — DEXTROSE 5 % IV SOLN: INTRAVENOUS | Status: DC

## 2016-02-09 MED ORDER — SODIUM CHLORIDE 0.9 % IV SOLN: 500.0000 mL | INTRAVENOUS | Status: DC

## 2016-02-09 NOTE — Progress Notes (Signed)
To recovery, report to Smith, RN, VSS 

## 2016-02-09 NOTE — Patient Instructions (Signed)
YOU HAD AN ENDOSCOPIC PROCEDURE TODAY AT [facility]:   Refer to the procedure report that was given to you for any specific questions about what was found during the examination.  If the procedure report does not answer your questions, please call your gastroenterologist to clarify.  If you requested that your care partner not be given the details of your procedure findings, then the procedure report has been included in a sealed envelope for you to review at your convenience later.  YOU SHOULD EXPECT: Some feelings of bloating in the abdomen. Passage of more gas than usual.  Walking can help get rid of the air that was put into your GI tract during the procedure and reduce the bloating. If you had a lower endoscopy (such as a colonoscopy or flexible sigmoidoscopy) you may notice spotting of blood in your stool or on the toilet paper. If you underwent a bowel prep for your procedure, you may not have a normal bowel movement for a few days.  Please Note:  You might notice some irritation and congestion in your nose or some drainage.  This is from the oxygen used during your procedure.  There is no need for concern and it should clear up in a day or so.  SYMPTOMS TO REPORT IMMEDIATELY:   Following lower endoscopy (colonoscopy or flexible sigmoidoscopy):  Excessive amounts of blood in the stool  Significant tenderness or worsening of abdominal pains  Swelling of the abdomen that is new, acute  Fever of 100F or higher   For urgent or emergent issues, a gastroenterologist can be reached at any hour by calling [phone].   DIET: Your first meal following the procedure should be a small meal and then it is ok to progress to your normal diet. Heavy or fried foods are harder to digest and may make you feel nauseous or bloated.  Likewise, meals heavy in dairy and vegetables can increase bloating.  Drink plenty of fluids but you should avoid alcoholic beverages for 24  hours.  ACTIVITY:  You should plan to take it easy for the rest of today and you should NOT DRIVE or use heavy machinery until tomorrow (because of the sedation medicines used during the test).    FOLLOW UP: Our staff will call the number listed on your records the next business day following your procedure to check on you and address any questions or concerns that you may have regarding the information given to you following your procedure. If we do not reach you, we will leave a message.  However, if you are feeling well and you are not experiencing any problems, there is no need to return our call.  We will assume that you have returned to your regular daily activities without incident.  If any biopsies were taken you will be contacted by phone or by letter within the next 1-3 weeks.  Please call us at [phone] if you have not heard about the biopsies in 3 weeks.    SIGNATURES/CONFIDENTIALITY: You and/or your care partner have signed paperwork which will be entered into your electronic medical record.  These signatures attest to the fact that that the information above on your After Visit Summary has been reviewed and is understood.  Full responsibility of the confidentiality of this discharge information lies with you and/or your care-partner.  Polyp handout given Await pathology results

## 2016-02-09 NOTE — Op Note (Signed)
[place]  [address] [place], [address]   COLONOSCOPY PROCEDURE REPORT  PATIENT: Jonathan Young, Jonathan Young  MR#: [id number] BIRTHDATE: 1951-02-05 , 53  yrs. old GENDER: male ENDOSCOPIST: Milus Banister, MD PROCEDURE DATE:  02/09/2016 PROCEDURE:   Colonoscopy, screening and Colonoscopy with snare polypectomy First Screening Colonoscopy - Avg.  risk and is 50 yrs.  old or older - No.  Prior Negative Screening - Now for repeat screening. 10 or more years since last screening  History of Adenoma - Now for follow-up colonoscopy & has been > or = to 3 yrs.  N/A  Polyps removed today? Yes ASA CLASS:   Class II INDICATIONS:Screening for colonic neoplasia and Colonoscopy 01/2006 Dr.  Ardis Hughs, hyperplastic polyp only. MEDICATIONS: Monitored anesthesia care and Propofol 180 mg IV  DESCRIPTION OF PROCEDURE:   After the risks benefits and alternatives of the procedure were thoroughly explained, informed consent was obtained.  The digital rectal exam revealed no abnormalities of the rectum.   The LB CF-H180AL Loaner [id number] endoscope was introduced through the anus and advanced to the cecum, which was identified by both the appendix and ileocecal valve. No adverse events experienced.   The quality of the prep was excellent.  The instrument was then slowly withdrawn as the colon was fully examined. Estimated blood loss is zero unless otherwise noted in this procedure report.  COLON FINDINGS: A sessile polyp measuring 3 mm in size was found in the ascending colon.  A polypectomy was performed with a cold snare.  The resection was complete, the polyp tissue was completely retrieved and sent to histology.   The examination was otherwise normal.  Retroflexed views revealed no abnormalities. The time to cecum = 2.1 Withdrawal time = 8.9   The scope was withdrawn and the procedure completed. COMPLICATIONS: There were no immediate complications.  ENDOSCOPIC IMPRESSION: 1.   Sessile  polyp was found in the ascending colon; polypectomy was performed with a cold snare 2.   The examination was otherwise normal  RECOMMENDATIONS: If the polyp(s) removed today are proven to be adenomatous (pre-cancerous) polyps, you will need a repeat colonoscopy in 5 years.  Otherwise you should continue to follow colorectal cancer screening guidelines for "routine risk" patients with colonoscopy in 10 years.  You will receive a letter within 1-2 weeks with the results of your biopsy as well as final recommendations.  Please call my office if you have not received a letter after 3 weeks.  eSigned:  Milus Banister, MD 02/09/2016 9:34 AM   cc: Scarlette Calico, MD

## 2016-02-09 NOTE — Progress Notes (Signed)
Called to room to assist during endoscopic procedure.  Patient ID and intended procedure confirmed with present staff. Received instructions for my participation in the procedure from the performing physician.  

## 2016-02-12 ENCOUNTER — Telehealth: Payer: Self-pay | Admitting: *Deleted

## 2016-02-12 ENCOUNTER — Encounter: Payer: Self-pay | Admitting: Internal Medicine

## 2016-02-12 ENCOUNTER — Other Ambulatory Visit: Payer: Self-pay | Admitting: Internal Medicine

## 2016-02-12 ENCOUNTER — Encounter: Payer: BLUE CROSS/BLUE SHIELD | Admitting: Gastroenterology

## 2016-02-12 NOTE — Telephone Encounter (Signed)
  Follow up Call-  Call back number 02/09/2016  Post procedure Call Back phone  # [phone]  Permission to leave phone message Yes     Patient questions:  Do you have a fever, pain , or abdominal swelling? No. Pain Score  0 *  Have you tolerated food without any problems? Yes.    Have you been able to return to your normal activities? Yes.    Do you have any questions about your discharge instructions: Diet   No. Medications  No. Follow up visit  No.  Do you have questions or concerns about your Care? No.  Actions: * If pain score is 4 or above: No action needed, pain <4.

## 2016-02-13 MED ORDER — EMTRICITABINE-TENOFOVIR DF 200-300 MG PO TABS: 1.0000 | ORAL_TABLET | Freq: Every day | ORAL | Status: DC

## 2016-02-13 NOTE — Addendum Note (Signed)
Addended by: Janith Lima on: 02/13/2016 01:19 PM   Modules accepted: Orders

## 2016-02-15 ENCOUNTER — Telehealth: Payer: Self-pay | Admitting: *Deleted

## 2016-02-15 MED ORDER — ROSUVASTATIN CALCIUM 10 MG PO TABS: 10.0000 mg | ORAL_TABLET | Freq: Every day | ORAL | Status: DC

## 2016-02-15 MED ORDER — PANTOPRAZOLE SODIUM 40 MG PO TBEC
40.0000 mg | DELAYED_RELEASE_TABLET | Freq: Every day | ORAL | Status: DC
Start: 2016-02-15 — End: 2016-02-16

## 2016-02-15 NOTE — Telephone Encounter (Signed)
Left msg on triage been trying to get his Pantoprazole & Lovastatin fill since last week. Needing refills today he is out of meds. Notified pt refills has been sent...Jonathan Young

## 2016-02-16 ENCOUNTER — Encounter: Payer: Self-pay | Admitting: Gastroenterology

## 2016-02-16 ENCOUNTER — Other Ambulatory Visit: Payer: Self-pay

## 2016-02-16 MED ORDER — ROSUVASTATIN CALCIUM 10 MG PO TABS: 10.0000 mg | ORAL_TABLET | Freq: Every day | ORAL | Status: DC

## 2016-02-16 MED ORDER — PANTOPRAZOLE SODIUM 40 MG PO TBEC
40.0000 mg | DELAYED_RELEASE_TABLET | Freq: Every day | ORAL | Status: DC
Start: 2016-02-16 — End: 2017-02-18

## 2016-03-05 DIAGNOSIS — H1013 Acute atopic conjunctivitis, bilateral: Secondary | ICD-10-CM | POA: Diagnosis not present

## 2016-03-16 DIAGNOSIS — F10129 Alcohol abuse with intoxication, unspecified: Secondary | ICD-10-CM | POA: Diagnosis not present

## 2016-03-16 DIAGNOSIS — Z7982 Long term (current) use of aspirin: Secondary | ICD-10-CM | POA: Insufficient documentation

## 2016-03-16 DIAGNOSIS — Y998 Other external cause status: Secondary | ICD-10-CM | POA: Insufficient documentation

## 2016-03-16 DIAGNOSIS — K219 Gastro-esophageal reflux disease without esophagitis: Secondary | ICD-10-CM | POA: Insufficient documentation

## 2016-03-16 DIAGNOSIS — Z79899 Other long term (current) drug therapy: Secondary | ICD-10-CM | POA: Diagnosis not present

## 2016-03-16 DIAGNOSIS — Y9389 Activity, other specified: Secondary | ICD-10-CM | POA: Diagnosis not present

## 2016-03-16 DIAGNOSIS — S0181XA Laceration without foreign body of other part of head, initial encounter: Secondary | ICD-10-CM | POA: Insufficient documentation

## 2016-03-16 DIAGNOSIS — S0993XA Unspecified injury of face, initial encounter: Secondary | ICD-10-CM | POA: Diagnosis not present

## 2016-03-16 DIAGNOSIS — Y9289 Other specified places as the place of occurrence of the external cause: Secondary | ICD-10-CM | POA: Diagnosis not present

## 2016-03-16 DIAGNOSIS — E7800 Pure hypercholesterolemia, unspecified: Secondary | ICD-10-CM | POA: Insufficient documentation

## 2016-03-16 DIAGNOSIS — M1990 Unspecified osteoarthritis, unspecified site: Secondary | ICD-10-CM | POA: Insufficient documentation

## 2016-03-16 DIAGNOSIS — Z791 Long term (current) use of non-steroidal anti-inflammatories (NSAID): Secondary | ICD-10-CM | POA: Diagnosis not present

## 2016-03-16 DIAGNOSIS — Z23 Encounter for immunization: Secondary | ICD-10-CM | POA: Insufficient documentation

## 2016-03-16 DIAGNOSIS — S0990XA Unspecified injury of head, initial encounter: Secondary | ICD-10-CM | POA: Diagnosis not present

## 2016-03-16 DIAGNOSIS — W01198A Fall on same level from slipping, tripping and stumbling with subsequent striking against other object, initial encounter: Secondary | ICD-10-CM | POA: Diagnosis not present

## 2016-03-16 DIAGNOSIS — Z87891 Personal history of nicotine dependence: Secondary | ICD-10-CM | POA: Insufficient documentation

## 2016-03-16 DIAGNOSIS — Z8679 Personal history of other diseases of the circulatory system: Secondary | ICD-10-CM | POA: Diagnosis not present

## 2016-03-17 ENCOUNTER — Other Ambulatory Visit: Payer: Self-pay | Admitting: Internal Medicine

## 2016-03-17 ENCOUNTER — Emergency Department (HOSPITAL_COMMUNITY)
Admission: EM | Admit: 2016-03-17 | Discharge: 2016-03-17 | Disposition: A | Discharge status: Home / Self Care | Payer: PPO | Attending: Emergency Medicine | Admitting: Emergency Medicine

## 2016-03-17 ENCOUNTER — Emergency Department (HOSPITAL_COMMUNITY): Payer: PPO

## 2016-03-17 ENCOUNTER — Encounter (HOSPITAL_COMMUNITY): Payer: Self-pay | Admitting: Oncology

## 2016-03-17 DIAGNOSIS — S0990XA Unspecified injury of head, initial encounter: Secondary | ICD-10-CM | POA: Diagnosis not present

## 2016-03-17 DIAGNOSIS — W19XXXA Unspecified fall, initial encounter: Secondary | ICD-10-CM

## 2016-03-17 DIAGNOSIS — S0181XA Laceration without foreign body of other part of head, initial encounter: Secondary | ICD-10-CM

## 2016-03-17 MED ORDER — TETANUS-DIPHTH-ACELL PERTUSSIS 5-2.5-18.5 LF-MCG/0.5 IM SUSP
0.5000 mL | Freq: Once | INTRAMUSCULAR | Status: AC
  Administered 2016-03-17: 0.5 mL via INTRAMUSCULAR
  Filled 2016-03-17: qty 0.5

## 2016-03-17 MED ORDER — LIDOCAINE-EPINEPHRINE (PF) 2 %-1:200000 IJ SOLN
20.0000 mL | Freq: Once | INTRAMUSCULAR | Status: AC
  Administered 2016-03-17: 20 mL
  Filled 2016-03-17: qty 20

## 2016-03-17 NOTE — ED Notes (Signed)
Pt had been drinking and stumbled striking his head on a table causing a vertical laceration to the middle of his forehead.  Pt is A&O x4.  Denies LOC.

## 2016-03-17 NOTE — ED Provider Notes (Signed)
CSN: [id number]     Arrival date & time 03/16/16  2357 History   First MD Initiated Contact with Patient 03/17/16 0305     Chief Complaint  Patient presents with  . Facial Laceration     (Consider location/radiation/quality/duration/timing/severity/associated sxs/prior Treatment) HPI Comments: Patient reports he fell tonight hitting a glass table causing a facial laceration. No LOC, nausea. He admits to heavy alcohol use tonight. No visual changes, neck pain, nausea, or other injury.  The history is provided by the patient. No language interpreter was used.    Past Medical History  Diagnosis Date  . Hypercholesteremia   . Osteoarthritis   . Dysrhythmia     PVC'S  . GERD (gastroesophageal reflux disease)    Past Surgical History  Procedure Laterality Date  . Total hip arthroplasty      RT TOAL HIP  . Hemorrhoid surgery  1982  . Tonsillectomy and adenoidectomy    . Shoulder surgery      BIL  . Total hip arthroplasty  06/01/2012    Procedure: TOTAL HIP ARTHROPLASTY ANTERIOR APPROACH;  Surgeon: Mauri Pole, MD;  Location: [facility];  Service: Orthopedics;  Laterality: Left;   Family History  Problem Relation Age of Onset  . Emphysema Mother   . Colon cancer Neg Hx   . Esophageal cancer Neg Hx   . Rectal cancer Neg Hx   . Stomach cancer Neg Hx    Social History  Substance Use Topics  . Smoking status: Former Smoker    Quit date: 05/27/1981  . Smokeless tobacco: Never Used  . Alcohol Use: 8.4 oz/week    14 Shots of liquor per week    Review of Systems  Constitutional: Negative for fever.  Eyes: Negative for visual disturbance.  Respiratory: Negative for shortness of breath.   Cardiovascular: Negative for chest pain.  Gastrointestinal: Negative for nausea and abdominal pain.  Musculoskeletal: Negative for neck pain.  Skin: Positive for wound.  Neurological: Negative for syncope and headaches.      Allergies  Ezetimibe-simvastatin  Home Medications   Prior  to Admission medications   Medication Sig Start Date End Date Taking? Authorizing Provider  aspirin 81 MG tablet Take 81 mg by mouth daily.   Yes Historical Provider, MD  Cyanocobalamin (B-12 DOTS) 500 MCG TBDP Take 500 mcg by mouth daily.     Yes Historical Provider, MD  emtricitabine-tenofovir (TRUVADA) 200-300 MG tablet Take 1 tablet by mouth daily. Enough until receive mail order 02/13/16  Yes Janith Lima, MD  fish oil-omega-3 fatty acids 1000 MG capsule Take 1 g by mouth daily.   Yes Historical Provider, MD  LORazepam (ATIVAN) 0.5 MG tablet Take 1 tablet (0.5 mg total) by mouth 2 (two) times daily as needed for anxiety. 10/17/15  Yes Janith Lima, MD  meloxicam (MOBIC) 15 MG tablet Take 1 tablet (15 mg total) by mouth daily. 01/04/16  Yes Janith Lima, MD  OVER THE COUNTER MEDICATION [facility] Testosterone replacement   Yes Historical Provider, MD  pantoprazole (PROTONIX) 40 MG tablet Take 1 tablet (40 mg total) by mouth daily. 02/16/16  Yes Janith Lima, MD  rosuvastatin (CRESTOR) 10 MG tablet Take 1 tablet (10 mg total) by mouth daily. 02/16/16  Yes Janith Lima, MD  tadalafil (CIALIS) 20 MG tablet Take 1 tablet (20 mg total) by mouth daily as needed for erectile dysfunction. 10/10/15  Yes Janith Lima, MD  valACYclovir (VALTREX) 1000 MG tablet Take 500 mg  by mouth daily.    Yes Historical Provider, MD  Vitamin D, Ergocalciferol, (DRISDOL) 50000 units CAPS capsule Take 1 capsule (50,000 Units total) by mouth once a week. 12/28/15  Yes Janith Lima, MD   BP 128/75 mmHg  Pulse 94  Temp(Src) 97.8 F (36.6 C) (Oral)  Resp 19  Ht 6\' 1"  (1.854 m)  Wt 92.987 kg  BMI 27.05 kg/m2  SpO2 96% Physical Exam  Constitutional: He is oriented to person, place, and time. He appears well-developed and well-nourished.  HENT:  Head: Normocephalic.  Eyes: EOM are normal. Pupils are equal, round, and reactive to light.  Neck: Normal range of motion. Neck supple.  Cardiovascular: Normal rate  and regular rhythm.   No murmur heard. Pulmonary/Chest: Effort normal and breath sounds normal. He has no wheezes. He has no rales.  Abdominal: Soft. There is no tenderness.  Musculoskeletal:  Moves all extremities. No midline cervical tenderness.   Neurological: He is alert and oriented to person, place, and time. He has normal strength and normal reflexes. No sensory deficit. He displays a negative Romberg sign. Coordination normal.  Acutely intoxicated but oriented, alert. Ambulatory without ataxia. No deficits of coordination. Speech clear, nonslurred, focused. CN's 3-12 grossly intact.   Skin: Skin is warm and dry.  5 cm linear laceration right forehead. No hematoma.  Psychiatric: He has a normal mood and affect.    ED Course  Procedures (including critical care time) Labs Review Labs Reviewed - No data to display Ct Head Wo Contrast  03/17/2016  CLINICAL DATA:  65 year old male with fall EXAM: CT HEAD WITHOUT CONTRAST TECHNIQUE: Contiguous axial images were obtained from the base of the skull through the vertex without intravenous contrast. COMPARISON:  None. FINDINGS: The ventricles and sulci are appropriate in size for patient's age. Minimal periventricular and deep white matter chronic microvascular ischemic changes may be present. There is no acute intracranial hemorrhage. No mass effect or midline shift. There is diffuse mild mucoperiosteal thickening of paranasal sinuses. No air-fluid levels. Mastoid air cells are clear. The calvarium is intact. IMPRESSION: No acute intracranial pathology. Electronically Signed   By: Anner Crete M.D.   On: 03/17/2016 05:10   LACERATION REPAIR Performed by: Charlann Lange A Authorized by: Charlann Lange A Consent: Verbal consent obtained. Risks and benefits: risks, benefits and alternatives were discussed Consent given by: patient Patient identity confirmed: provided demographic data Prepped and Draped in normal sterile fashion Wound  explored  Laceration Location: right forehead  Laceration Length: 5 cm  No Foreign Bodies seen or palpated  Anesthesia: local infiltration  Local anesthetic: lidocaine 2% w/epinephrine  Anesthetic total: 2 ml  Irrigation method: syringe Amount of cleaning: standard  Skin closure: Vicryl 7-0, dermabond  Number of sutures: 5 SQ  Technique: SQ to approximate wound edges and dermabond to close  Patient tolerance: Patient tolerated the procedure well with no immediate complications.  Imaging Review No results found. I have personally reviewed and evaluated these images and lab results as part of my medical decision-making.   EKG Interpretation None      MDM   Final diagnoses:  None    1. Fall 2. Facial laceration  Uncomplicated laceration repair to forehead. Negative CT head for intracranial injury in patient who is intoxicated but shows no neurologic deficits on exam. He is ambulatory, remains oriented and awake throughout duration of ED visit. He is felt stable for discharge home.     Charlann Lange, PA-C 03/17/16 Ryan,  MD 03/17/16 [id number]

## 2016-03-17 NOTE — Discharge Instructions (Signed)
Facial Laceration ° A facial laceration is a cut on the face. These injuries can be painful and cause bleeding. Lacerations usually heal quickly, but they need special care to reduce scarring. °DIAGNOSIS  °Your health care provider will take a medical history, ask for details about how the injury occurred, and examine the wound to determine how deep the cut is. °TREATMENT  °Some facial lacerations may not require closure. Others may not be able to be closed because of an increased risk of infection. The risk of infection and the chance for successful closure will depend on various factors, including the amount of time since the injury occurred. °The wound may be cleaned to help prevent infection. If closure is appropriate, pain medicines may be given if needed. Your health care provider will use stitches (sutures), wound glue (adhesive), or skin adhesive strips to repair the laceration. These tools bring the skin edges together to allow for faster healing and a better cosmetic outcome. If needed, you may also be given a tetanus shot. °HOME CARE INSTRUCTIONS °· Only take over-the-counter or prescription medicines as directed by your health care provider. °· Follow your health care provider's instructions for wound care. These instructions will vary depending on the technique used for closing the wound. °For Sutures: °· Keep the wound clean and dry.   °· If you were given a bandage (dressing), you should change it at least once a day. Also change the dressing if it becomes wet or dirty, or as directed by your health care provider.   °· Wash the wound with soap and water 2 times a day. Rinse the wound off with water to remove all soap. Pat the wound dry with a clean towel.   °· After cleaning, apply a thin layer of the antibiotic ointment recommended by your health care provider. This will help prevent infection and keep the dressing from sticking.   °· You may shower as usual after the first 24 hours. Do not soak the  wound in water until the sutures are removed.   °· Get your sutures removed as directed by your health care provider. With facial lacerations, sutures should usually be taken out after 4-5 days to avoid stitch marks.   °· Wait a few days after your sutures are removed before applying any makeup. °For Skin Adhesive Strips: °· Keep the wound clean and dry.   °· Do not get the skin adhesive strips wet. You may bathe carefully, using caution to keep the wound dry.   °· If the wound gets wet, pat it dry with a clean towel.   °· Skin adhesive strips will fall off on their own. You may trim the strips as the wound heals. Do not remove skin adhesive strips that are still stuck to the wound. They will fall off in time.   °For Wound Adhesive: °· You may briefly wet your wound in the shower or bath. Do not soak or scrub the wound. Do not swim. Avoid periods of heavy sweating until the skin adhesive has fallen off on its own. After showering or bathing, gently pat the wound dry with a clean towel.   °· Do not apply liquid medicine, cream medicine, ointment medicine, or makeup to your wound while the skin adhesive is in place. This may loosen the film before your wound is healed.   °· If a dressing is placed over the wound, be careful not to apply tape directly over the skin adhesive. This may cause the adhesive to be pulled off before the wound is healed.   °· Avoid   prolonged exposure to sunlight or tanning lamps while the skin adhesive is in place. °· The skin adhesive will usually remain in place for 5-10 days, then naturally fall off the skin. Do not pick at the adhesive film.   °After Healing: °Once the wound has healed, cover the wound with sunscreen during the day for 1 full year. This can help minimize scarring. Exposure to ultraviolet light in the first year will darken the scar. It can take 1-2 years for the scar to lose its redness and to heal completely.  °SEEK MEDICAL CARE IF: °· You have a fever. °SEEK IMMEDIATE  MEDICAL CARE IF: °· You have redness, pain, or swelling around the wound.   °· You see a yellowish-white fluid (pus) coming from the wound.   °  °This information is not intended to replace advice given to you by your health care provider. Make sure you discuss any questions you have with your health care provider. °  °Document Released: 01/16/2005 Document Revised: 12/30/2014 Document Reviewed: 07/22/2013 °Elsevier Interactive Patient Education ©2016 [employer] ° °

## 2016-03-18 ENCOUNTER — Other Ambulatory Visit: Payer: Self-pay | Admitting: Internal Medicine

## 2016-03-19 ENCOUNTER — Telehealth: Payer: Self-pay | Admitting: Internal Medicine

## 2016-03-19 DIAGNOSIS — L700 Acne vulgaris: Secondary | ICD-10-CM | POA: Diagnosis not present

## 2016-03-19 DIAGNOSIS — T1490 Injury, unspecified: Secondary | ICD-10-CM | POA: Diagnosis not present

## 2016-03-19 MED ORDER — EMTRICITABINE-TENOFOVIR DF 200-300 MG PO TABS: 1.0000 | ORAL_TABLET | Freq: Every day | ORAL | Status: DC

## 2016-03-19 NOTE — Telephone Encounter (Signed)
Rx sent 

## 2016-03-19 NOTE — Telephone Encounter (Signed)
Pt request refill for TRUVADA) to be send to [facility]. Pt is out of this med and wondering if we can send it in today, pt has an cpe on 04/03/16. Please help

## 2016-03-20 DIAGNOSIS — H1013 Acute atopic conjunctivitis, bilateral: Secondary | ICD-10-CM | POA: Diagnosis not present

## 2016-03-20 NOTE — Addendum Note (Signed)
Addended by: Earnstine Regal on: 03/20/2016 11:34 AM   Modules accepted: Orders

## 2016-04-03 ENCOUNTER — Encounter: Payer: Self-pay | Admitting: Internal Medicine

## 2016-04-03 ENCOUNTER — Other Ambulatory Visit (INDEPENDENT_AMBULATORY_CARE_PROVIDER_SITE_OTHER): Payer: PPO

## 2016-04-03 ENCOUNTER — Ambulatory Visit (INDEPENDENT_AMBULATORY_CARE_PROVIDER_SITE_OTHER): Payer: PPO | Admitting: Internal Medicine

## 2016-04-03 VITALS — BP 130/82 | HR 78 | Temp 98.0°F | Resp 16 | Ht 73.0 in | Wt 202.0 lb

## 2016-04-03 DIAGNOSIS — Z7251 High risk heterosexual behavior: Secondary | ICD-10-CM

## 2016-04-03 DIAGNOSIS — R739 Hyperglycemia, unspecified: Secondary | ICD-10-CM | POA: Diagnosis not present

## 2016-04-03 DIAGNOSIS — K219 Gastro-esophageal reflux disease without esophagitis: Secondary | ICD-10-CM | POA: Diagnosis not present

## 2016-04-03 DIAGNOSIS — E538 Deficiency of other specified B group vitamins: Secondary | ICD-10-CM | POA: Diagnosis not present

## 2016-04-03 DIAGNOSIS — E559 Vitamin D deficiency, unspecified: Secondary | ICD-10-CM

## 2016-04-03 DIAGNOSIS — R972 Elevated prostate specific antigen [PSA]: Secondary | ICD-10-CM | POA: Diagnosis not present

## 2016-04-03 DIAGNOSIS — E785 Hyperlipidemia, unspecified: Secondary | ICD-10-CM | POA: Diagnosis not present

## 2016-04-03 DIAGNOSIS — F411 Generalized anxiety disorder: Secondary | ICD-10-CM

## 2016-04-03 DIAGNOSIS — Z0000 Encounter for general adult medical examination without abnormal findings: Secondary | ICD-10-CM | POA: Diagnosis not present

## 2016-04-03 LAB — LIPID PANEL
CHOLESTEROL: 178 mg/dL (ref 0–200)
HDL: 41.7 mg/dL (ref >39.00)
NonHDL: 136.75
TRIGLYCERIDES: 217 mg/dL — AB (ref 0.0–149.0)
Total CHOL/HDL Ratio: 4
VLDL: 43.4 mg/dL — ABNORMAL HIGH (ref 0.0–40.0)

## 2016-04-03 LAB — CBC WITH DIFFERENTIAL/PLATELET
Basophils Absolute: 0.1 10*3/uL (ref 0.0–0.1)
Basophils Relative: 0.7 % (ref 0.0–3.0)
EOS PCT: 6 % — AB (ref 0.0–5.0)
Eosinophils Absolute: 0.4 10*3/uL (ref 0.0–0.7)
HCT: 45.2 % (ref 39.0–52.0)
Hemoglobin: 15.4 g/dL (ref 13.0–17.0)
LYMPHS ABS: 2.9 10*3/uL (ref 0.7–4.0)
Lymphocytes Relative: 38.6 % (ref 12.0–46.0)
MCHC: 34 g/dL (ref 30.0–36.0)
MCV: 95.9 fl (ref 78.0–100.0)
MONO ABS: 0.6 10*3/uL (ref 0.1–1.0)
MONOS PCT: 8.7 % (ref 3.0–12.0)
NEUTROS ABS: 3.4 10*3/uL (ref 1.4–7.7)
NEUTROS PCT: 46 % (ref 43.0–77.0)
PLATELETS: 248 10*3/uL (ref 150.0–400.0)
RBC: 4.71 Mil/uL (ref 4.22–5.81)
RDW: 12.9 % (ref 11.5–15.5)
WBC: 7.4 10*3/uL (ref 4.0–10.5)

## 2016-04-03 LAB — COMPREHENSIVE METABOLIC PANEL
ALK PHOS: 63 U/L (ref 39–117)
ALT: 35 U/L (ref 0–53)
AST: 32 U/L (ref 0–37)
Albumin: 4.5 g/dL (ref 3.5–5.2)
BILIRUBIN TOTAL: 0.7 mg/dL (ref 0.2–1.2)
BUN: 20 mg/dL (ref 6–23)
CO2: 28 meq/L (ref 19–32)
Calcium: 9.9 mg/dL (ref 8.4–10.5)
Chloride: 103 mEq/L (ref 96–112)
Creatinine, Ser: 1.42 mg/dL (ref 0.40–1.50)
GFR: 53.14 mL/min — AB (ref >60.00)
GLUCOSE: 99 mg/dL (ref 70–99)
POTASSIUM: 4.8 meq/L (ref 3.5–5.1)
SODIUM: 138 meq/L (ref 135–145)
TOTAL PROTEIN: 7.2 g/dL (ref 6.0–8.3)

## 2016-04-03 LAB — TSH: TSH: 2.28 u[IU]/mL (ref 0.35–4.50)

## 2016-04-03 LAB — LDL CHOLESTEROL, DIRECT: Direct LDL: 87 mg/dL

## 2016-04-03 LAB — HEMOGLOBIN A1C: HEMOGLOBIN A1C: 5.8 % (ref 4.6–6.5)

## 2016-04-03 LAB — URINALYSIS, ROUTINE W REFLEX MICROSCOPIC
Bilirubin Urine: NEGATIVE
HGB URINE DIPSTICK: NEGATIVE
Ketones, ur: NEGATIVE
Leukocytes, UA: NEGATIVE
NITRITE: NEGATIVE
RBC / HPF: NONE SEEN (ref 0–?)
Specific Gravity, Urine: 1.01 (ref 1.000–1.030)
Total Protein, Urine: NEGATIVE
UROBILINOGEN UA: 0.2 (ref 0.0–1.0)
Urine Glucose: NEGATIVE
WBC UA: NONE SEEN (ref 0–?)
pH: 6 (ref 5.0–8.0)

## 2016-04-03 LAB — HIV ANTIBODY (ROUTINE TESTING W REFLEX): HIV 1&2 Ab, 4th Generation: NONREACTIVE

## 2016-04-03 NOTE — Patient Instructions (Signed)

## 2016-04-03 NOTE — Progress Notes (Signed)
Subjective:  Patient ID: Jonathan Young, male    DOB: 03/28/1951  Age: 65 y.o. MRN: [id number]  CC: Annual Exam   HPI Jonathan Young presents for a CPX.  He recently tripped over a rug at a social event and sustained a laceration over his right forehead. That has healed. He denies headache, nausea, vomiting, paresthesias. He takes Truvada due to high risk sexual behavior and says he is tolerating it well with no abdominal pain, rash, dysuria. He takes Crestor for hypercholesterolemia and tolerates it well with no muscle aches or joint aches. He experiences the occasional episode of anxiety and panic and takes lorazepam for symptom relief. He has a history of an elevated PSA and is followed closely by urology every 4-6 months. He states his last visit with him about 3-4 months ago revealed good results with a declining PSA. He feels well today and offers no complaints.  Outpatient Prescriptions Prior to Visit  Medication Sig Dispense Refill  . aspirin 81 MG tablet Take 81 mg by mouth daily.    . Cyanocobalamin (B-12 DOTS) 500 MCG TBDP Take 500 mcg by mouth daily.      Marland Kitchen emtricitabine-tenofovir (TRUVADA) 200-300 MG tablet Take 1 tablet by mouth daily. Enough until receive mail order 30 tablet 5  . fish oil-omega-3 fatty acids 1000 MG capsule Take 1 g by mouth daily.    Marland Kitchen LORazepam (ATIVAN) 0.5 MG tablet Take 1 tablet (0.5 mg total) by mouth 2 (two) times daily as needed for anxiety. 30 tablet 3  . meloxicam (MOBIC) 15 MG tablet Take 1 tablet (15 mg total) by mouth daily. 30 tablet 3  . OVER THE COUNTER MEDICATION [facility] Testosterone replacement    . pantoprazole (PROTONIX) 40 MG tablet Take 1 tablet (40 mg total) by mouth daily. 90 tablet 2  . rosuvastatin (CRESTOR) 10 MG tablet Take 1 tablet (10 mg total) by mouth daily. 90 tablet 2  . tadalafil (CIALIS) 20 MG tablet Take 1 tablet (20 mg total) by mouth daily as needed for erectile dysfunction. 8 tablet 11  . valACYclovir (VALTREX)  1000 MG tablet Take 500 mg by mouth daily.     . Vitamin D, Ergocalciferol, (DRISDOL) 50000 units CAPS capsule Take 1 capsule (50,000 Units total) by mouth once a week. 5 capsule 5   No facility-administered medications prior to visit.    ROS Review of Systems  Constitutional: Negative.  Negative for fever, chills, diaphoresis, appetite change and fatigue.  HENT: Negative.  Negative for sore throat.   Eyes: Negative.   Respiratory: Negative.  Negative for cough, choking, chest tightness, shortness of breath and stridor.   Cardiovascular: Negative.  Negative for chest pain, palpitations and leg swelling.  Gastrointestinal: Negative.  Negative for nausea, vomiting, abdominal pain, diarrhea, constipation and blood in stool.  Endocrine: Negative.  Negative for polydipsia, polyphagia and polyuria.  Genitourinary: Negative.  Negative for dysuria, urgency, hematuria, penile swelling, difficulty urinating, genital sores and penile pain.  Musculoskeletal: Negative.  Negative for myalgias, back pain, joint swelling, arthralgias and neck pain.  Skin: Negative.  Negative for color change, pallor and rash.  Allergic/Immunologic: Negative.   Neurological: Negative.  Negative for dizziness, tremors, weakness, light-headedness and numbness.  Hematological: Negative.  Negative for adenopathy. Does not bruise/bleed easily.  Psychiatric/Behavioral: Negative for hallucinations, sleep disturbance, dysphoric mood and decreased concentration. The patient is nervous/anxious.     Objective:  BP 130/82 mmHg  Pulse 78  Temp(Src) 98 F (36.7 C) (Oral)  Resp 16  Ht 6\' 1"  (1.854 m)  Wt 202 lb (91.627 kg)  BMI 26.66 kg/m2  SpO2 97%  BP Readings from Last 3 Encounters:  04/03/16 130/82  03/17/16 140/86  02/09/16 125/79    Wt Readings from Last 3 Encounters:  04/03/16 202 lb (91.627 kg)  03/17/16 205 lb (92.987 kg)  02/09/16 204 lb (92.534 kg)    Physical Exam  Constitutional: He is oriented to  person, place, and time. No distress.  HENT:  Head: Normocephalic and atraumatic.    Mouth/Throat: Oropharynx is clear and moist. No oropharyngeal exudate.  Eyes: Conjunctivae are normal. Right eye exhibits no discharge. Left eye exhibits no discharge. No scleral icterus.  Neck: Normal range of motion. Neck supple. No JVD present. No tracheal deviation present. No thyromegaly present.  Cardiovascular: Normal rate, regular rhythm, normal heart sounds and intact distal pulses.  Exam reveals no gallop and no friction rub.   No murmur heard. Pulmonary/Chest: Effort normal and breath sounds normal. No stridor. No respiratory distress. He has no wheezes. He has no rales. He exhibits no tenderness.  Abdominal: Soft. Bowel sounds are normal. He exhibits no distension and no mass. There is no tenderness. There is no rebound and no guarding.  Musculoskeletal: Normal range of motion. He exhibits no edema or tenderness.  Lymphadenopathy:    He has no cervical adenopathy.  Neurological: He is alert and oriented to person, place, and time. He has normal reflexes. He displays normal reflexes. No cranial nerve deficit. He exhibits normal muscle tone. Coordination normal.  Skin: Skin is warm and dry. No rash noted. He is not diaphoretic. No erythema. No pallor.  Psychiatric: He has a normal mood and affect. His behavior is normal. Judgment and thought content normal.  Vitals reviewed.   Lab Results  Component Value Date   WBC 7.4 04/03/2016   HGB 15.4 04/03/2016   HCT 45.2 04/03/2016   PLT 248.0 04/03/2016   GLUCOSE 99 04/03/2016   CHOL 178 04/03/2016   TRIG 217.0* 04/03/2016   HDL 41.70 04/03/2016   LDLDIRECT 87.0 04/03/2016   LDLCALC 116* 12/29/2014   ALT 35 04/03/2016   AST 32 04/03/2016   NA 138 04/03/2016   K 4.8 04/03/2016   CL 103 04/03/2016   CREATININE 1.42 04/03/2016   BUN 20 04/03/2016   CO2 28 04/03/2016   TSH 2.28 04/03/2016   PSA 4.96* 12/29/2014   INR 0.94 05/27/2012    HGBA1C 5.8 04/03/2016    Ct Head Wo Contrast  03/17/2016  CLINICAL DATA:  65 year old male with fall EXAM: CT HEAD WITHOUT CONTRAST TECHNIQUE: Contiguous axial images were obtained from the base of the skull through the vertex without intravenous contrast. COMPARISON:  None. FINDINGS: The ventricles and sulci are appropriate in size for patient's age. Minimal periventricular and deep white matter chronic microvascular ischemic changes may be present. There is no acute intracranial hemorrhage. No mass effect or midline shift. There is diffuse mild mucoperiosteal thickening of paranasal sinuses. No air-fluid levels. Mastoid air cells are clear. The calvarium is intact. IMPRESSION: No acute intracranial pathology. Electronically Signed   By: Anner Crete M.D.   On: 03/17/2016 05:10    Assessment & Plan:   Jeanne was seen today for annual exam.  Diagnoses and all orders for this visit:  Vitamin B 12 deficiency- improvement noted, he will continue oral B12 supplements -     CBC with Differential/Platelet; Future  Gastroesophageal reflux disease without esophagitis- symptoms well-controlled with no signs  of upper GI pathology -     CBC with Differential/Platelet; Future  Vitamin D deficiency  PSA elevation- followed closely by urology -     Cancel: PSA; Future -     Urinalysis, Routine w reflex microscopic (not at [facility]); Future  Hyperlipidemia with target LDL less than 130- he is achieved his LDL goal is doing well on the statin -     Lipid panel; Future -     TSH; Future -     Comprehensive metabolic panel; Future  Hyperglycemia- he has prediabetes, no medications are needed at this time, he will continue to work on his lifestyle modifications. -     Hemoglobin A1c; Future -     Comprehensive metabolic panel; Future  GAD (generalized anxiety disorder)- he agrees to very limited intake of alcohol and will use lorazepam as needed  High risk sexual behavior- HIV and syphilis are  negative, he will continue safe sexual practices also wants to continue taking Truvada -     RPR; Future -     HIV antibody; Future  I am having Mr. Vanweelden maintain his valACYclovir, Cyanocobalamin, fish oil-omega-3 fatty acids, tadalafil, LORazepam, Vitamin D (Ergocalciferol), meloxicam, aspirin, OVER THE COUNTER MEDICATION, pantoprazole, rosuvastatin, and emtricitabine-tenofovir.  No orders of the defined types were placed in this encounter.     Follow-up: Return in about 6 months (around 10/03/2016).  Scarlette Calico, MD

## 2016-04-04 ENCOUNTER — Encounter: Payer: Self-pay | Admitting: Internal Medicine

## 2016-04-04 LAB — RPR

## 2016-04-04 NOTE — Assessment & Plan Note (Signed)

## 2016-04-16 DIAGNOSIS — R972 Elevated prostate specific antigen [PSA]: Secondary | ICD-10-CM | POA: Diagnosis not present

## 2016-04-23 DIAGNOSIS — N400 Enlarged prostate without lower urinary tract symptoms: Secondary | ICD-10-CM | POA: Diagnosis not present

## 2016-04-23 DIAGNOSIS — R972 Elevated prostate specific antigen [PSA]: Secondary | ICD-10-CM | POA: Diagnosis not present

## 2016-05-28 ENCOUNTER — Telehealth: Payer: Self-pay | Admitting: *Deleted

## 2016-05-28 MED ORDER — MELOXICAM 15 MG PO TABS: 15.0000 mg | ORAL_TABLET | Freq: Every day | ORAL | Status: DC

## 2016-05-28 NOTE — Telephone Encounter (Signed)
Rec'd call pt is requesting refill on meloxicam. Verified pharmacy sent to [facility] aid...Jonathan Young

## 2016-07-16 ENCOUNTER — Other Ambulatory Visit: Payer: Self-pay | Admitting: *Deleted

## 2016-07-16 MED ORDER — VITAMIN D (ERGOCALCIFEROL) 1.25 MG (50000 UNIT) PO CAPS: 50000.0000 [IU] | ORAL_CAPSULE | ORAL | 3 refills | Status: DC

## 2016-09-18 ENCOUNTER — Telehealth: Payer: Self-pay | Admitting: *Deleted

## 2016-09-18 ENCOUNTER — Other Ambulatory Visit: Payer: Self-pay | Admitting: Internal Medicine

## 2016-09-18 MED ORDER — EMTRICITABINE-TENOFOVIR DF 200-300 MG PO TABS: 1.0000 | ORAL_TABLET | Freq: Every day | ORAL | 5 refills | Status: DC

## 2016-09-18 NOTE — Telephone Encounter (Signed)
Rec'd call pt is needing refill sent to [facility] aid for Truvada. Sent electronically...Jonathan Young

## 2016-09-30 ENCOUNTER — Other Ambulatory Visit: Payer: Self-pay

## 2016-09-30 MED ORDER — MELOXICAM 15 MG PO TABS: 15.0000 mg | ORAL_TABLET | Freq: Every day | ORAL | 0 refills | Status: DC

## 2016-09-30 NOTE — Telephone Encounter (Signed)
rf rq from [facility] for meloxicam. Please advise

## 2016-10-10 DIAGNOSIS — B379 Candidiasis, unspecified: Secondary | ICD-10-CM | POA: Diagnosis not present

## 2016-10-10 DIAGNOSIS — L814 Other melanin hyperpigmentation: Secondary | ICD-10-CM | POA: Diagnosis not present

## 2016-10-10 DIAGNOSIS — L853 Xerosis cutis: Secondary | ICD-10-CM | POA: Diagnosis not present

## 2016-10-10 DIAGNOSIS — L821 Other seborrheic keratosis: Secondary | ICD-10-CM | POA: Diagnosis not present

## 2016-10-10 DIAGNOSIS — D225 Melanocytic nevi of trunk: Secondary | ICD-10-CM | POA: Diagnosis not present

## 2016-10-10 DIAGNOSIS — Z85828 Personal history of other malignant neoplasm of skin: Secondary | ICD-10-CM | POA: Diagnosis not present

## 2016-10-10 DIAGNOSIS — L820 Inflamed seborrheic keratosis: Secondary | ICD-10-CM | POA: Diagnosis not present

## 2016-10-10 DIAGNOSIS — D1800 Hemangioma unspecified site: Secondary | ICD-10-CM | POA: Diagnosis not present

## 2016-10-10 DIAGNOSIS — Z23 Encounter for immunization: Secondary | ICD-10-CM | POA: Diagnosis not present

## 2016-10-24 LAB — PSA: PSA: 4.94

## 2016-10-30 ENCOUNTER — Other Ambulatory Visit: Payer: Self-pay | Admitting: Internal Medicine

## 2016-10-31 DIAGNOSIS — N5201 Erectile dysfunction due to arterial insufficiency: Secondary | ICD-10-CM | POA: Diagnosis not present

## 2016-10-31 DIAGNOSIS — R3915 Urgency of urination: Secondary | ICD-10-CM | POA: Diagnosis not present

## 2016-10-31 DIAGNOSIS — R972 Elevated prostate specific antigen [PSA]: Secondary | ICD-10-CM | POA: Diagnosis not present

## 2016-11-01 ENCOUNTER — Other Ambulatory Visit: Payer: Self-pay | Admitting: Internal Medicine

## 2016-11-01 DIAGNOSIS — N529 Male erectile dysfunction, unspecified: Secondary | ICD-10-CM

## 2016-11-01 NOTE — Telephone Encounter (Signed)
Please advise at your convenience 

## 2016-11-05 ENCOUNTER — Telehealth: Payer: Self-pay

## 2016-11-05 ENCOUNTER — Other Ambulatory Visit: Payer: Self-pay | Admitting: Internal Medicine

## 2016-11-05 NOTE — Telephone Encounter (Signed)
Contacted pt and appt scheduled for Thrusday at 11am.

## 2016-11-05 NOTE — Telephone Encounter (Signed)
Please call patient back in regard.  °

## 2016-11-05 NOTE — Telephone Encounter (Signed)
Per verbal from PCP.  Pt needs to come in for an appointment. Pt has been rq rx for Truvada but pt needs to come in for appt and labs to monitor kidney function.   Contacted [facility] and cancelled refills for Truvada sent in on 09/15/2016.   Called pt and lmom for pt to call back.   Re: pt needs to make an appt.

## 2016-11-07 ENCOUNTER — Ambulatory Visit (INDEPENDENT_AMBULATORY_CARE_PROVIDER_SITE_OTHER): Payer: PPO | Admitting: Internal Medicine

## 2016-11-07 ENCOUNTER — Encounter: Payer: Self-pay | Admitting: Internal Medicine

## 2016-11-07 ENCOUNTER — Telehealth: Payer: Self-pay | Admitting: Emergency Medicine

## 2016-11-07 ENCOUNTER — Other Ambulatory Visit (INDEPENDENT_AMBULATORY_CARE_PROVIDER_SITE_OTHER): Payer: PPO

## 2016-11-07 ENCOUNTER — Telehealth: Payer: Self-pay | Admitting: Internal Medicine

## 2016-11-07 VITALS — BP 140/90 | HR 65 | Temp 97.8°F | Resp 16 | Ht 73.0 in | Wt 204.0 lb

## 2016-11-07 DIAGNOSIS — E291 Testicular hypofunction: Secondary | ICD-10-CM

## 2016-11-07 DIAGNOSIS — E538 Deficiency of other specified B group vitamins: Secondary | ICD-10-CM

## 2016-11-07 DIAGNOSIS — E559 Vitamin D deficiency, unspecified: Secondary | ICD-10-CM

## 2016-11-07 DIAGNOSIS — M159 Polyosteoarthritis, unspecified: Secondary | ICD-10-CM

## 2016-11-07 DIAGNOSIS — I10 Essential (primary) hypertension: Secondary | ICD-10-CM | POA: Diagnosis not present

## 2016-11-07 DIAGNOSIS — Z7251 High risk heterosexual behavior: Secondary | ICD-10-CM

## 2016-11-07 DIAGNOSIS — R5383 Other fatigue: Secondary | ICD-10-CM

## 2016-11-07 DIAGNOSIS — R972 Elevated prostate specific antigen [PSA]: Secondary | ICD-10-CM

## 2016-11-07 DIAGNOSIS — M150 Primary generalized (osteo)arthritis: Secondary | ICD-10-CM

## 2016-11-07 LAB — URINALYSIS, ROUTINE W REFLEX MICROSCOPIC
BILIRUBIN URINE: NEGATIVE
Hgb urine dipstick: NEGATIVE
KETONES UR: NEGATIVE
Leukocytes, UA: NEGATIVE
Nitrite: NEGATIVE
PH: 5.5 (ref 5.0–8.0)
RBC / HPF: NONE SEEN (ref 0–?)
Specific Gravity, Urine: <=1.005 — AB (ref 1.000–1.030)
TOTAL PROTEIN, URINE-UPE24: NEGATIVE
URINE GLUCOSE: NEGATIVE
UROBILINOGEN UA: 0.2 (ref 0.0–1.0)
WBC, UA: NONE SEEN (ref 0–?)

## 2016-11-07 LAB — CBC WITH DIFFERENTIAL/PLATELET
Basophils Absolute: 0 10*3/uL (ref 0.0–0.1)
Basophils Relative: 0.6 % (ref 0.0–3.0)
EOS PCT: 4.7 % (ref 0.0–5.0)
Eosinophils Absolute: 0.3 10*3/uL (ref 0.0–0.7)
HCT: 46.3 % (ref 39.0–52.0)
Hemoglobin: 15.7 g/dL (ref 13.0–17.0)
LYMPHS ABS: 2 10*3/uL (ref 0.7–4.0)
Lymphocytes Relative: 32.9 % (ref 12.0–46.0)
MCHC: 33.9 g/dL (ref 30.0–36.0)
MCV: 95.3 fl (ref 78.0–100.0)
MONO ABS: 0.5 10*3/uL (ref 0.1–1.0)
Monocytes Relative: 8.1 % (ref 3.0–12.0)
NEUTROS ABS: 3.3 10*3/uL (ref 1.4–7.7)
NEUTROS PCT: 53.7 % (ref 43.0–77.0)
PLATELETS: 238 10*3/uL (ref 150.0–400.0)
RBC: 4.85 Mil/uL (ref 4.22–5.81)
RDW: 13.1 % (ref 11.5–15.5)
WBC: 6.2 10*3/uL (ref 4.0–10.5)

## 2016-11-07 LAB — VITAMIN B12: VITAMIN B 12: 853 pg/mL (ref 211–911)

## 2016-11-07 LAB — COMPREHENSIVE METABOLIC PANEL
ALK PHOS: 62 U/L (ref 39–117)
ALT: 28 U/L (ref 0–53)
AST: 29 U/L (ref 0–37)
Albumin: 4.7 g/dL (ref 3.5–5.2)
BILIRUBIN TOTAL: 0.6 mg/dL (ref 0.2–1.2)
BUN: 19 mg/dL (ref 6–23)
CO2: 26 mEq/L (ref 19–32)
Calcium: 9.7 mg/dL (ref 8.4–10.5)
Chloride: 105 mEq/L (ref 96–112)
Creatinine, Ser: 1.36 mg/dL (ref 0.40–1.50)
GFR: 55.75 mL/min — ABNORMAL LOW (ref >60.00)
GLUCOSE: 99 mg/dL (ref 70–99)
POTASSIUM: 4.3 meq/L (ref 3.5–5.1)
SODIUM: 140 meq/L (ref 135–145)
TOTAL PROTEIN: 7.4 g/dL (ref 6.0–8.3)

## 2016-11-07 LAB — FOLATE: Folate: 10.3 ng/mL (ref >5.9)

## 2016-11-07 NOTE — Telephone Encounter (Signed)
Pt has had the Hep C antibody test once. Did you want him to have it again?

## 2016-11-07 NOTE — Patient Instructions (Signed)
Hypertension Hypertension, commonly called high blood pressure, is when the force of blood pumping through your arteries is too strong. Your arteries are the blood vessels that carry blood from your heart throughout your body. A blood pressure reading consists of a higher number over a lower number, such as 110/72. The higher number (systolic) is the pressure inside your arteries when your heart pumps. The lower number (diastolic) is the pressure inside your arteries when your heart relaxes. Ideally you want your blood pressure below 120/80. Hypertension forces your heart to work harder to pump blood. Your arteries may become narrow or stiff. Having untreated or uncontrolled hypertension can cause heart attack, stroke, kidney disease, and other problems. What increases the risk? Some risk factors for high blood pressure are controllable. Others are not. Risk factors you cannot control include:  Race. You may be at higher risk if you are African American.  Age. Risk increases with age.  Gender. Men are at higher risk than women before age 45 years. After age 65, women are at higher risk than men. Risk factors you can control include:  Not getting enough exercise or physical activity.  Being overweight.  Getting too much fat, sugar, calories, or salt in your diet.  Drinking too much alcohol. What are the signs or symptoms? Hypertension does not usually cause signs or symptoms. Extremely high blood pressure (hypertensive crisis) may cause headache, anxiety, shortness of breath, and nosebleed. How is this diagnosed? To check if you have hypertension, your health care provider will measure your blood pressure while you are seated, with your arm held at the level of your heart. It should be measured at least twice using the same arm. Certain conditions can cause a difference in blood pressure between your right and left arms. A blood pressure reading that is higher than normal on one occasion does  not mean that you need treatment. If it is not clear whether you have high blood pressure, you may be asked to return on a different day to have your blood pressure checked again. Or, you may be asked to monitor your blood pressure at home for 1 or more weeks. How is this treated? Treating high blood pressure includes making lifestyle changes and possibly taking medicine. Living a healthy lifestyle can help lower high blood pressure. You may need to change some of your habits. Lifestyle changes may include:  Following the DASH diet. This diet is high in fruits, vegetables, and whole grains. It is low in salt, red meat, and added sugars.  Keep your sodium intake below 2,300 mg per day.  Getting at least 30-45 minutes of aerobic exercise at least 4 times per week.  Losing weight if necessary.  Not smoking.  Limiting alcoholic beverages.  Learning ways to reduce stress. Your health care provider may prescribe medicine if lifestyle changes are not enough to get your blood pressure under control, and if one of the following is true:  You are 18-59 years of age and your systolic blood pressure is above 140.  You are 60 years of age or older, and your systolic blood pressure is above 150.  Your diastolic blood pressure is above 90.  You have diabetes, and your systolic blood pressure is over 140 or your diastolic blood pressure is over 90.  You have kidney disease and your blood pressure is above 140/90.  You have heart disease and your blood pressure is above 140/90. Your personal target blood pressure may vary depending on your medical   conditions, your age, and other factors. Follow these instructions at home:  Have your blood pressure rechecked as directed by your health care provider.  Take medicines only as directed by your health care provider. Follow the directions carefully. Blood pressure medicines must be taken as prescribed. The medicine does not work as well when you skip  doses. Skipping doses also puts you at risk for problems.  Do not smoke.  Monitor your blood pressure at home as directed by your health care provider. Contact a health care provider if:  You think you are having a reaction to medicines taken.  You have recurrent headaches or feel dizzy.  You have swelling in your ankles.  You have trouble with your vision. Get help right away if:  You develop a severe headache or confusion.  You have unusual weakness, numbness, or feel faint.  You have severe chest or abdominal pain.  You vomit repeatedly.  You have trouble breathing. This information is not intended to replace advice given to you by your health care provider. Make sure you discuss any questions you have with your health care provider. Document Released: 12/09/2005 Document Revised: 05/16/2016 Document Reviewed: 10/01/2013 Elsevier Interactive Patient Education  2017 [employer] Inc.  

## 2016-11-07 NOTE — Progress Notes (Signed)
Pre visit review using our clinic review tool, if applicable. No additional management support is needed unless otherwise documented below in the visit note. 

## 2016-11-07 NOTE — Telephone Encounter (Signed)
No, it does not need to be repeated

## 2016-11-07 NOTE — Telephone Encounter (Signed)
Patient walked in to advise that he had lab draw blood for he[p c test--- wasn't sure if you wanted him to have it or not. If so, please place order.

## 2016-11-07 NOTE — Progress Notes (Signed)
Subjective:  Patient ID: Jonathan Young, male    DOB: 1951-09-06  Age: 65 y.o. MRN: [id number]  CC: Hypertension and Osteoarthritis   HPI Jonathan Young presents for Follow-up.  He was to continue taking Truvada. He considers his sexual practices to be relatively high risk and he doesn't always use protection. He's had no side effects from Truvada such as stomachache, nausea, or vomiting.  He does complain of about a 3 month history of fatigue. He denies DOE, CP, shortness of breath, edema, or palpitations.  He saw a urologist 1 week prior to this visit and apparently he is considering doing a biopsy to screen for prostate cancer. His PSA remains slightly elevated at 4.94 but does not have a significant velocity.  Outpatient Medications Prior to Visit  Medication Sig Dispense Refill  . aspirin 81 MG tablet Take 81 mg by mouth daily.    Marland Kitchen CIALIS 20 MG tablet TAKE 1 TABLET BY MOUTH ONE HOUR PRIOR TO INTERCOURSE; MAXIMUM 1 TABLET IN 24 HOURS ! 10 tablet 5  . Cyanocobalamin (B-12 DOTS) 500 MCG TBDP Take 500 mcg by mouth daily.      Marland Kitchen OVER THE COUNTER MEDICATION [facility] Testosterone replacement    . pantoprazole (PROTONIX) 40 MG tablet Take 1 tablet (40 mg total) by mouth daily. 90 tablet 2  . rosuvastatin (CRESTOR) 10 MG tablet Take 1 tablet (10 mg total) by mouth daily. 90 tablet 2  . valACYclovir (VALTREX) 1000 MG tablet Take 500 mg by mouth daily.     . Vitamin D, Ergocalciferol, (DRISDOL) 50000 units CAPS capsule Take 1 capsule (50,000 Units total) by mouth once a week. 5 capsule 3  . emtricitabine-tenofovir (TRUVADA) 200-300 MG tablet Take 1 tablet by mouth daily. 30 tablet 5  . LORazepam (ATIVAN) 0.5 MG tablet Take 1 tablet (0.5 mg total) by mouth 2 (two) times daily as needed for anxiety. 30 tablet 3  . meloxicam (MOBIC) 15 MG tablet Take 1 tablet (15 mg total) by mouth daily. 30 tablet 0  . fish oil-omega-3 fatty acids 1000 MG capsule Take 1 g by mouth daily.     No  facility-administered medications prior to visit.     ROS Review of Systems  Constitutional: Positive for fatigue. Negative for activity change, appetite change and unexpected weight change.  HENT: Negative.   Eyes: Negative.   Respiratory: Negative for cough, choking, shortness of breath, wheezing and stridor.   Cardiovascular: Negative.  Negative for chest pain, palpitations and leg swelling.  Gastrointestinal: Negative.  Negative for abdominal pain, blood in stool, constipation, diarrhea, nausea and vomiting.  Endocrine: Negative.   Genitourinary: Positive for frequency. Negative for difficulty urinating, discharge, dysuria, genital sores, hematuria, penile swelling, scrotal swelling, testicular pain and urgency.  Musculoskeletal: Positive for arthralgias and back pain. Negative for joint swelling, myalgias and neck pain.  Allergic/Immunologic: Negative.   Neurological: Negative.  Negative for dizziness, weakness, light-headedness, numbness and headaches.  Hematological: Negative.  Negative for adenopathy. Does not bruise/bleed easily.  Psychiatric/Behavioral: Negative.     Objective:  BP 140/90 (BP Location: Left Arm, Patient Position: Sitting, Cuff Size: Normal)   Pulse 65   Temp 97.8 F (36.6 C) (Oral)   Resp 16   Ht 6\' 1"  (1.854 m)   Wt 204 lb (92.5 kg)   SpO2 98%   BMI 26.91 kg/m   BP Readings from Last 3 Encounters:  11/07/16 140/90  04/03/16 130/82  03/17/16 140/86    Wt Readings from  Last 3 Encounters:  11/07/16 204 lb (92.5 kg)  04/03/16 202 lb (91.6 kg)  03/17/16 205 lb (93 kg)    Physical Exam  Constitutional: He is oriented to person, place, and time. He appears well-nourished. No distress.  HENT:  Head: Normocephalic and atraumatic.  Mouth/Throat: Oropharynx is clear and moist. No oropharyngeal exudate.  Eyes: Conjunctivae are normal. Right eye exhibits no discharge. Left eye exhibits no discharge. No scleral icterus.  Neck: Normal range of motion.  Neck supple. No JVD present. No tracheal deviation present. No thyromegaly present.  Cardiovascular: Normal rate, regular rhythm, normal heart sounds and intact distal pulses.  Exam reveals no gallop and no friction rub.   No murmur heard. EKG ---  Sinus  Rhythm  WITHIN NORMAL LIMITS  Pulmonary/Chest: Effort normal and breath sounds normal. No stridor. No respiratory distress. He has no wheezes. He has no rales. He exhibits no tenderness.  Abdominal: Soft. Bowel sounds are normal. He exhibits no distension and no mass. There is no tenderness. There is no rebound and no guarding.  Musculoskeletal: Normal range of motion. He exhibits no edema, tenderness or deformity.  Lymphadenopathy:    He has no cervical adenopathy.  Neurological: He is oriented to person, place, and time.  Skin: Skin is warm and dry. No rash noted. He is not diaphoretic. No erythema. No pallor.  Psychiatric: He has a normal mood and affect. His behavior is normal. Judgment and thought content normal.  Vitals reviewed.   Lab Results  Component Value Date   WBC 6.2 11/07/2016   HGB 15.7 11/07/2016   HCT 46.3 11/07/2016   PLT 238.0 11/07/2016   GLUCOSE 99 11/07/2016   CHOL 178 04/03/2016   TRIG 217.0 (H) 04/03/2016   HDL 41.70 04/03/2016   LDLDIRECT 87.0 04/03/2016   LDLCALC 116 (H) 12/29/2014   ALT 28 11/07/2016   AST 29 11/07/2016   NA 140 11/07/2016   K 4.3 11/07/2016   CL 105 11/07/2016   CREATININE 1.36 11/07/2016   BUN 19 11/07/2016   CO2 26 11/07/2016   TSH 1.18 11/07/2016   PSA 4.94 10/24/2016   INR 0.94 05/27/2012   HGBA1C 5.8 04/03/2016    Ct Head Wo Contrast  Result Date: 03/17/2016 CLINICAL DATA:  65 year old male with fall EXAM: CT HEAD WITHOUT CONTRAST TECHNIQUE: Contiguous axial images were obtained from the base of the skull through the vertex without intravenous contrast. COMPARISON:  None. FINDINGS: The ventricles and sulci are appropriate in size for patient's age. Minimal  periventricular and deep white matter chronic microvascular ischemic changes may be present. There is no acute intracranial hemorrhage. No mass effect or midline shift. There is diffuse mild mucoperiosteal thickening of paranasal sinuses. No air-fluid levels. Mastoid air cells are clear. The calvarium is intact. IMPRESSION: No acute intracranial pathology. Electronically Signed   By: Anner Crete M.D.   On: 03/17/2016 05:10    Assessment & Plan:   Cayle was seen today for hypertension and osteoarthritis.  Diagnoses and all orders for this visit:  Hypogonadism male- His testosterone level is normal, I don't think this is causing his fatigue. He will continue using the over-the-counter supplement to boost his testosterone level but at this time I do not recommend a prescription testosterone replacement therapy. -     Testosterone Total,Free,Bio, Males; Future  High risk sexual behavior- his renal function is stable and he is negative for hepatitis B or HIV, will continue Truvada. -     Hepatitis B surface  antigen; Future -     HIV antibody; Future -     RPR; Future -     emtricitabine-tenofovir (TRUVADA) 200-300 MG tablet; Take 1 tablet by mouth daily.  PSA elevation- as per urology -     Cancel: PSA; Future  Vitamin B 12 deficiency- improvement noted with oral replacement therapy -     CBC with Differential/Platelet; Future -     Vitamin B12; Future -     Folate; Future  Vitamin D deficiency  Essential hypertension- his EKG is negative for LVH, his labs are negative for any secondary or metabolic causes, at this time he does not want to start an antihypertensive medication. -     Comprehensive metabolic panel; Future -     Urinalysis, Routine w reflex microscopic (not at [facility]); Future -     EKG 12-Lead  Fatigue, unspecified type- EKG is normal and he has no cardiac symptoms so at this time will not screen for CHF or CAD, his labs are negative for any secondary metabolic causes.  We'll continue to follow this as indicated. -     Comprehensive metabolic panel; Future -     Thyroid Panel With TSH; Future  Primary osteoarthritis involving multiple joints -     meloxicam (MOBIC) 15 MG tablet; Take 1 tablet (15 mg total) by mouth daily.   I have discontinued Mr. Shannahan LORazepam. I am also having him maintain his valACYclovir, Cyanocobalamin, fish oil-omega-3 fatty acids, aspirin, OVER THE COUNTER MEDICATION, pantoprazole, rosuvastatin, Vitamin D (Ergocalciferol), CIALIS, emtricitabine-tenofovir, and meloxicam.  Meds ordered this encounter  Medications  . emtricitabine-tenofovir (TRUVADA) 200-300 MG tablet    Sig: Take 1 tablet by mouth daily.    Dispense:  30 tablet    Refill:  3  . meloxicam (MOBIC) 15 MG tablet    Sig: Take 1 tablet (15 mg total) by mouth daily.    Dispense:  30 tablet    Refill:  3     Follow-up: Return in about 4 months (around 03/07/2017).  Scarlette Calico, MD

## 2016-11-07 NOTE — Telephone Encounter (Signed)
Pt called and wants to know if he can have a full lab panel drawn today. He is coming in at 11:00 today for his appt. He said if you have any questions you can give him a call back. Please advise thanks.

## 2016-11-07 NOTE — Telephone Encounter (Signed)
Will let pt know that the Hep C is not needed. It was completed last year.

## 2016-11-07 NOTE — Telephone Encounter (Signed)
appt completed

## 2016-11-08 LAB — TESTOSTERONE TOTAL,FREE,BIO, MALES
Albumin: 4.6 g/dL (ref 3.6–5.1)
SEX HORMONE BINDING: 39 nmol/L (ref 22–77)
TESTOSTERONE FREE: 46.5 pg/mL (ref 46.0–224.0)
Testosterone, Bioavailable: 97.7 ng/dL — ABNORMAL LOW (ref 110.0–575.0)
Testosterone: 410 ng/dL (ref 250–827)

## 2016-11-08 LAB — THYROID PANEL WITH TSH
FREE THYROXINE INDEX: 1.8 (ref 1.4–3.8)
T3 UPTAKE: 33 % (ref 22–35)
T4, Total: 5.6 ug/dL (ref 4.5–12.0)
TSH: 1.18 mIU/L (ref 0.40–4.50)

## 2016-11-08 LAB — RPR

## 2016-11-08 LAB — HIV ANTIBODY (ROUTINE TESTING W REFLEX): HIV 1&2 Ab, 4th Generation: NONREACTIVE

## 2016-11-08 LAB — HEPATITIS B SURFACE ANTIGEN: Hepatitis B Surface Ag: NEGATIVE

## 2016-11-08 MED ORDER — MELOXICAM 15 MG PO TABS: 15.0000 mg | ORAL_TABLET | Freq: Every day | ORAL | 3 refills | Status: DC

## 2016-11-08 MED ORDER — EMTRICITABINE-TENOFOVIR DF 200-300 MG PO TABS: 1.0000 | ORAL_TABLET | Freq: Every day | ORAL | 3 refills | Status: DC

## 2016-11-09 ENCOUNTER — Encounter: Payer: Self-pay | Admitting: Internal Medicine

## 2016-12-10 ENCOUNTER — Other Ambulatory Visit: Payer: Self-pay | Admitting: Internal Medicine

## 2016-12-23 HISTORY — PX: ANTERIOR CERVICAL DECOMP/DISCECTOMY FUSION: SHX1161

## 2017-02-18 ENCOUNTER — Other Ambulatory Visit: Payer: Self-pay | Admitting: Internal Medicine

## 2017-03-12 ENCOUNTER — Other Ambulatory Visit: Payer: Self-pay | Admitting: Internal Medicine

## 2017-03-12 DIAGNOSIS — M159 Polyosteoarthritis, unspecified: Secondary | ICD-10-CM

## 2017-03-12 DIAGNOSIS — M150 Primary generalized (osteo)arthritis: Principal | ICD-10-CM

## 2017-03-12 DIAGNOSIS — H1013 Acute atopic conjunctivitis, bilateral: Secondary | ICD-10-CM | POA: Diagnosis not present

## 2017-03-13 DIAGNOSIS — M25511 Pain in right shoulder: Secondary | ICD-10-CM | POA: Diagnosis not present

## 2017-03-13 DIAGNOSIS — M542 Cervicalgia: Secondary | ICD-10-CM | POA: Diagnosis not present

## 2017-03-17 ENCOUNTER — Other Ambulatory Visit: Payer: Self-pay | Admitting: Internal Medicine

## 2017-03-17 DIAGNOSIS — Z7251 High risk heterosexual behavior: Secondary | ICD-10-CM

## 2017-03-19 DIAGNOSIS — M25511 Pain in right shoulder: Secondary | ICD-10-CM | POA: Diagnosis not present

## 2017-03-20 ENCOUNTER — Encounter: Payer: Self-pay | Admitting: Internal Medicine

## 2017-03-24 ENCOUNTER — Other Ambulatory Visit: Payer: Self-pay | Admitting: Internal Medicine

## 2017-03-24 DIAGNOSIS — Z7251 High risk heterosexual behavior: Secondary | ICD-10-CM

## 2017-03-24 MED ORDER — EMTRICITABINE-TENOFOVIR DF 200-300 MG PO TABS: 1.0000 | ORAL_TABLET | Freq: Every day | ORAL | 0 refills | Status: DC

## 2017-04-03 ENCOUNTER — Ambulatory Visit (INDEPENDENT_AMBULATORY_CARE_PROVIDER_SITE_OTHER): Payer: PPO | Admitting: Internal Medicine

## 2017-04-03 ENCOUNTER — Other Ambulatory Visit (INDEPENDENT_AMBULATORY_CARE_PROVIDER_SITE_OTHER): Payer: PPO

## 2017-04-03 ENCOUNTER — Encounter: Payer: Self-pay | Admitting: Internal Medicine

## 2017-04-03 ENCOUNTER — Telehealth: Payer: Self-pay | Admitting: *Deleted

## 2017-04-03 VITALS — BP 142/88 | HR 69 | Temp 98.2°F | Resp 16 | Ht 73.0 in | Wt 201.5 lb

## 2017-04-03 DIAGNOSIS — Z79899 Other long term (current) drug therapy: Secondary | ICD-10-CM

## 2017-04-03 DIAGNOSIS — E781 Pure hyperglyceridemia: Secondary | ICD-10-CM | POA: Insufficient documentation

## 2017-04-03 DIAGNOSIS — N183 Chronic kidney disease, stage 3 unspecified: Secondary | ICD-10-CM

## 2017-04-03 DIAGNOSIS — E538 Deficiency of other specified B group vitamins: Secondary | ICD-10-CM

## 2017-04-03 DIAGNOSIS — E785 Hyperlipidemia, unspecified: Secondary | ICD-10-CM

## 2017-04-03 DIAGNOSIS — I10 Essential (primary) hypertension: Secondary | ICD-10-CM | POA: Diagnosis not present

## 2017-04-03 DIAGNOSIS — Z7251 High risk heterosexual behavior: Secondary | ICD-10-CM | POA: Diagnosis not present

## 2017-04-03 LAB — CBC WITH DIFFERENTIAL/PLATELET
Basophils Absolute: 0 10*3/uL (ref 0.0–0.1)
Basophils Relative: 0.6 % (ref 0.0–3.0)
EOS ABS: 0.4 10*3/uL (ref 0.0–0.7)
EOS PCT: 5.6 % — AB (ref 0.0–5.0)
HCT: 45.7 % (ref 39.0–52.0)
HEMOGLOBIN: 15.3 g/dL (ref 13.0–17.0)
LYMPHS ABS: 2.2 10*3/uL (ref 0.7–4.0)
Lymphocytes Relative: 33.7 % (ref 12.0–46.0)
MCHC: 33.6 g/dL (ref 30.0–36.0)
MCV: 96.7 fl (ref 78.0–100.0)
MONO ABS: 0.6 10*3/uL (ref 0.1–1.0)
Monocytes Relative: 8.8 % (ref 3.0–12.0)
NEUTROS PCT: 51.3 % (ref 43.0–77.0)
Neutro Abs: 3.3 10*3/uL (ref 1.4–7.7)
Platelets: 248 10*3/uL (ref 150.0–400.0)
RBC: 4.72 Mil/uL (ref 4.22–5.81)
RDW: 12.9 % (ref 11.5–15.5)
WBC: 6.4 10*3/uL (ref 4.0–10.5)

## 2017-04-03 LAB — URINALYSIS, ROUTINE W REFLEX MICROSCOPIC
Bilirubin Urine: NEGATIVE
HGB URINE DIPSTICK: NEGATIVE
Ketones, ur: NEGATIVE
Leukocytes, UA: NEGATIVE
NITRITE: NEGATIVE
PH: 6 (ref 5.0–8.0)
Specific Gravity, Urine: 1.02 (ref 1.000–1.030)
TOTAL PROTEIN, URINE-UPE24: NEGATIVE
Urine Glucose: NEGATIVE
Urobilinogen, UA: 0.2 (ref 0.0–1.0)

## 2017-04-03 LAB — BASIC METABOLIC PANEL
BUN: 19 mg/dL (ref 6–23)
CALCIUM: 9.6 mg/dL (ref 8.4–10.5)
CO2: 29 meq/L (ref 19–32)
Chloride: 107 mEq/L (ref 96–112)
Creatinine, Ser: 1.48 mg/dL (ref 0.40–1.50)
GFR: 50.5 mL/min — ABNORMAL LOW (ref >60.00)
GLUCOSE: 108 mg/dL — AB (ref 70–99)
POTASSIUM: 5.2 meq/L — AB (ref 3.5–5.1)
SODIUM: 142 meq/L (ref 135–145)

## 2017-04-03 LAB — LIPID PANEL
Cholesterol: 158 mg/dL (ref 0–200)
HDL: 46.1 mg/dL (ref >39.00)
LDL CALC: 96 mg/dL (ref 0–99)
NonHDL: 112.19
Total CHOL/HDL Ratio: 3
Triglycerides: 79 mg/dL (ref 0.0–149.0)
VLDL: 15.8 mg/dL (ref 0.0–40.0)

## 2017-04-03 LAB — FOLATE: FOLATE: 8.1 ng/mL (ref >5.9)

## 2017-04-03 LAB — HIV ANTIBODY (ROUTINE TESTING W REFLEX): HIV: NONREACTIVE

## 2017-04-03 LAB — HEPATITIS B SURFACE ANTIGEN: Hepatitis B Surface Ag: NEGATIVE

## 2017-04-03 LAB — VITAMIN B12: VITAMIN B 12: 771 pg/mL (ref 211–911)

## 2017-04-03 MED ORDER — ZOSTER VAC RECOMB ADJUVANTED 50 MCG/0.5ML IM SUSR: 0.5000 mL | Freq: Once | INTRAMUSCULAR | 1 refills | Status: AC

## 2017-04-03 NOTE — Progress Notes (Signed)
Pre visit review using our clinic review tool, if applicable. No additional management support is needed unless otherwise documented below in the visit note. 

## 2017-04-03 NOTE — Telephone Encounter (Signed)
Rec;d call from pharmacist stating received script for pt shingrix, before they can administer they need to know if pt CD4 is above/greater than 200 ct per CDC....Jonathan Young

## 2017-04-03 NOTE — Telephone Encounter (Signed)
I don't understand why the pharmacist would asked this question  Shingrix is not a live virus  The patient does not have HIV infection, he treat takes Truvada to prevent HIV infection

## 2017-04-03 NOTE — Patient Instructions (Signed)

## 2017-04-03 NOTE — Progress Notes (Signed)
Subjective:  Patient ID: Jonathan Young, male    DOB: 1951-07-11  Age: 66 y.o. MRN: [id number]  CC: Hypertension and Hyperlipidemia   HPI Jonathan Young presents for Follow-up on hypertension, hypercholesterolemia, and use of Truvada to prevent HIV infection. He complains of numbness in both upper extremities symmetrically. He knows that he has to history of DDD of the cervical spine. He also had a recent right shoulder injury and may need shoulder surgery. He tells me his blood pressure has been well controlled and he offers no other complaints.  Outpatient Medications Prior to Visit  Medication Sig Dispense Refill  . aspirin 81 MG tablet Take 81 mg by mouth daily.    Marland Kitchen CIALIS 20 MG tablet TAKE 1 TABLET BY MOUTH ONE HOUR PRIOR TO INTERCOURSE; MAXIMUM 1 TABLET IN 24 HOURS ! 10 tablet 5  . Cyanocobalamin (B-12 DOTS) 500 MCG TBDP Take 500 mcg by mouth daily.      Marland Kitchen emtricitabine-tenofovir (TRUVADA) 200-300 MG tablet Take 1 tablet by mouth daily. 30 tablet 0  . fish oil-omega-3 fatty acids 1000 MG capsule Take 1 g by mouth daily.    . meloxicam (MOBIC) 15 MG tablet take 1 tablet by mouth once daily 30 tablet 3  . OVER THE COUNTER MEDICATION [facility] Testosterone replacement    . pantoprazole (PROTONIX) 40 MG tablet take 1 tablet by mouth once daily 90 tablet 2  . rosuvastatin (CRESTOR) 10 MG tablet take 1 tablet by mouth once daily 90 tablet 2  . valACYclovir (VALTREX) 1000 MG tablet Take 500 mg by mouth daily.     . Vitamin D, Ergocalciferol, (DRISDOL) 50000 units CAPS capsule take 1 capsule by mouth every week 5 capsule 3   No facility-administered medications prior to visit.     ROS Review of Systems  Constitutional: Negative.  Negative for appetite change, chills, diaphoresis, fatigue and unexpected weight change.  HENT: Negative.  Negative for trouble swallowing.   Eyes: Negative.   Respiratory: Negative for cough, chest tightness, shortness of breath and wheezing.     Cardiovascular: Negative for chest pain, palpitations and leg swelling.  Gastrointestinal: Negative for abdominal pain, constipation, diarrhea, nausea, rectal pain and vomiting.  Genitourinary: Negative.  Negative for difficulty urinating, discharge, dysuria, genital sores, penile pain, penile swelling, scrotal swelling, testicular pain and urgency.  Musculoskeletal: Positive for neck pain. Negative for back pain, joint swelling, myalgias and neck stiffness.       He rarely has neck pain  Neurological: Positive for numbness. Negative for dizziness, tremors, weakness and headaches.  Hematological: Negative for adenopathy. Does not bruise/bleed easily.  Psychiatric/Behavioral: Negative.     Objective:  BP (!) 142/88 (BP Location: Left Arm, Patient Position: Sitting, Cuff Size: Large)   Pulse 69   Temp 98.2 F (36.8 C) (Oral)   Resp 16   Ht 6\' 1"  (1.854 m)   Wt 201 lb 8 oz (91.4 kg)   SpO2 98%   BMI 26.58 kg/m   BP Readings from Last 3 Encounters:  04/03/17 (!) 142/88  11/07/16 140/90  04/03/16 130/82    Wt Readings from Last 3 Encounters:  04/03/17 201 lb 8 oz (91.4 kg)  11/07/16 204 lb (92.5 kg)  04/03/16 202 lb (91.6 kg)    Physical Exam  Constitutional: No distress.  HENT:  Mouth/Throat: Oropharynx is clear and moist. No oropharyngeal exudate.  Eyes: Conjunctivae are normal. Right eye exhibits no discharge. Left eye exhibits no discharge. No scleral icterus.  Neck:  Normal range of motion. Neck supple. No JVD present. No tracheal deviation present. No thyromegaly present.  Cardiovascular: Normal rate, regular rhythm, normal heart sounds and intact distal pulses.  Exam reveals no gallop and no friction rub.   No murmur heard. Pulmonary/Chest: Effort normal and breath sounds normal. No stridor. No respiratory distress. He has no wheezes. He has no rales. He exhibits no tenderness.  Abdominal: Soft. Bowel sounds are normal. He exhibits no distension and no mass. There is no  tenderness. There is no rebound and no guarding.  Musculoskeletal: Normal range of motion. He exhibits no edema, tenderness or deformity.  Lymphadenopathy:    He has no cervical adenopathy.  Neurological: He displays no atrophy, no tremor and normal reflexes. No cranial nerve deficit or sensory deficit. He exhibits normal muscle tone. He displays a negative Romberg sign. He displays no seizure activity. Coordination and gait normal.  Reflex Scores:      Tricep reflexes are 0 on the right side and 0 on the left side.      Bicep reflexes are 0 on the right side and 0 on the left side.      Brachioradialis reflexes are 0 on the right side and 0 on the left side.      Patellar reflexes are 2+ on the right side and 2+ on the left side.      Achilles reflexes are 1+ on the right side and 1+ on the left side. Skin: Skin is warm and dry. No rash noted. He is not diaphoretic. No erythema. No pallor.  Vitals reviewed.   Lab Results  Component Value Date   WBC 6.2 11/07/2016   HGB 15.7 11/07/2016   HCT 46.3 11/07/2016   PLT 238.0 11/07/2016   GLUCOSE 99 11/07/2016   CHOL 178 04/03/2016   TRIG 217.0 (H) 04/03/2016   HDL 41.70 04/03/2016   LDLDIRECT 87.0 04/03/2016   LDLCALC 116 (H) 12/29/2014   ALT 28 11/07/2016   AST 29 11/07/2016   NA 140 11/07/2016   K 4.3 11/07/2016   CL 105 11/07/2016   CREATININE 1.36 11/07/2016   BUN 19 11/07/2016   CO2 26 11/07/2016   TSH 1.18 11/07/2016   PSA 4.94 10/24/2016   INR 0.94 05/27/2012   HGBA1C 5.8 04/03/2016    Ct Head Wo Contrast  Result Date: 03/17/2016 CLINICAL DATA:  66 year old male with fall EXAM: CT HEAD WITHOUT CONTRAST TECHNIQUE: Contiguous axial images were obtained from the base of the skull through the vertex without intravenous contrast. COMPARISON:  None. FINDINGS: The ventricles and sulci are appropriate in size for patient's age. Minimal periventricular and deep white matter chronic microvascular ischemic changes may be present.  There is no acute intracranial hemorrhage. No mass effect or midline shift. There is diffuse mild mucoperiosteal thickening of paranasal sinuses. No air-fluid levels. Mastoid air cells are clear. The calvarium is intact. IMPRESSION: No acute intracranial pathology. Electronically Signed   By: Anner Crete M.D.   On: 03/17/2016 05:10    Assessment & Plan:   Ladarien was seen today for hypertension and hyperlipidemia.  Diagnoses and all orders for this visit:  Essential hypertension- his blood pressure is adequately well controlled, his electrolytes are normal, he has had a slight decrease in his serum creatinine, for now he will not take an antihypertensive. -     CBC with Differential/Platelet; Future -     Basic metabolic panel; Future -     Urinalysis, Routine w reflex microscopic; Future  Need for prophylactic chemotherapy  High risk sexual behavior- his creatinine clearance is dropped down to 50 so I have advised him that it is not safe for him to take Truvada anymore due to his declining renal function. He continues to be negative for hep B and HIV and is also negative for syphilis. -     Hepatitis B surface antigen; Future -     RPR; Future -     HIV antibody; Future  Hyperlipidemia with target LDL less than 130- he has achieved his LDL goal is doing well on the statin. -     Lipid panel; Future  Pure hyperglyceridemia- his triglyceride level is a 217, no medications are needed to treat this, he was encouraged to improve his lifestyle modifications. -     Lipid panel; Future  Vitamin B 12 deficiency- his B12 level is normal so this does not explain the numbness in both arms. I have asked him to undergo an MRI of the cervical spine to see if the numbness is caused by cervical disc disease but he is not interested in pursuing that at this time. He will let me know if he changes his mind. -     Vitamin B12; Future -     Folate; Future   I am having Mr. Justiss maintain his  valACYclovir, Cyanocobalamin, fish oil-omega-3 fatty acids, aspirin, OVER THE COUNTER MEDICATION, CIALIS, Vitamin D (Ergocalciferol), pantoprazole, rosuvastatin, meloxicam, and emtricitabine-tenofovir.  No orders of the defined types were placed in this encounter.    Follow-up: Return in about 4 months (around 08/03/2017).  Scarlette Calico, MD

## 2017-04-04 ENCOUNTER — Encounter: Payer: Self-pay | Admitting: Internal Medicine

## 2017-04-04 LAB — RPR

## 2017-04-04 NOTE — Telephone Encounter (Signed)
Called and spoke to pharmacist.   Informed them of PCP response.  They will call pt and schedule him to come in.

## 2017-04-07 DIAGNOSIS — N2889 Other specified disorders of kidney and ureter: Secondary | ICD-10-CM | POA: Insufficient documentation

## 2017-04-07 DIAGNOSIS — N183 Chronic kidney disease, stage 3 unspecified: Secondary | ICD-10-CM | POA: Insufficient documentation

## 2017-05-20 DIAGNOSIS — R972 Elevated prostate specific antigen [PSA]: Secondary | ICD-10-CM | POA: Diagnosis not present

## 2017-05-20 LAB — PSA: PSA: 7.7

## 2017-05-21 ENCOUNTER — Other Ambulatory Visit: Payer: Self-pay | Admitting: Internal Medicine

## 2017-05-26 DIAGNOSIS — R3915 Urgency of urination: Secondary | ICD-10-CM | POA: Diagnosis not present

## 2017-05-26 DIAGNOSIS — N5201 Erectile dysfunction due to arterial insufficiency: Secondary | ICD-10-CM | POA: Diagnosis not present

## 2017-05-26 DIAGNOSIS — R972 Elevated prostate specific antigen [PSA]: Secondary | ICD-10-CM | POA: Diagnosis not present

## 2017-05-26 DIAGNOSIS — N401 Enlarged prostate with lower urinary tract symptoms: Secondary | ICD-10-CM | POA: Diagnosis not present

## 2017-06-12 ENCOUNTER — Encounter: Payer: Self-pay | Admitting: Internal Medicine

## 2017-06-14 ENCOUNTER — Other Ambulatory Visit: Payer: Self-pay | Admitting: Internal Medicine

## 2017-06-16 ENCOUNTER — Other Ambulatory Visit: Payer: Self-pay | Admitting: Internal Medicine

## 2017-06-16 DIAGNOSIS — R209 Unspecified disturbances of skin sensation: Principal | ICD-10-CM

## 2017-06-16 DIAGNOSIS — IMO0001 Reserved for inherently not codable concepts without codable children: Secondary | ICD-10-CM

## 2017-06-23 DIAGNOSIS — R972 Elevated prostate specific antigen [PSA]: Secondary | ICD-10-CM | POA: Diagnosis not present

## 2017-06-29 ENCOUNTER — Ambulatory Visit
Admission: RE | Admit: 2017-06-29 | Discharge: 2017-06-29 | Disposition: A | Discharge status: Home / Self Care | Payer: PPO | Source: Ambulatory Visit | Attending: Internal Medicine | Admitting: Internal Medicine

## 2017-06-29 ENCOUNTER — Encounter: Payer: Self-pay | Admitting: Internal Medicine

## 2017-06-29 ENCOUNTER — Other Ambulatory Visit: Payer: Self-pay | Admitting: Internal Medicine

## 2017-06-29 DIAGNOSIS — M4802 Spinal stenosis, cervical region: Secondary | ICD-10-CM | POA: Diagnosis not present

## 2017-06-29 DIAGNOSIS — J320 Chronic maxillary sinusitis: Secondary | ICD-10-CM | POA: Insufficient documentation

## 2017-06-29 DIAGNOSIS — IMO0001 Reserved for inherently not codable concepts without codable children: Secondary | ICD-10-CM

## 2017-06-29 DIAGNOSIS — R209 Unspecified disturbances of skin sensation: Principal | ICD-10-CM

## 2017-06-29 DIAGNOSIS — G992 Myelopathy in diseases classified elsewhere: Secondary | ICD-10-CM | POA: Insufficient documentation

## 2017-06-29 DIAGNOSIS — R202 Paresthesia of skin: Secondary | ICD-10-CM | POA: Diagnosis not present

## 2017-07-08 ENCOUNTER — Ambulatory Visit (INDEPENDENT_AMBULATORY_CARE_PROVIDER_SITE_OTHER): Payer: PPO | Admitting: Internal Medicine

## 2017-07-08 ENCOUNTER — Encounter: Payer: Self-pay | Admitting: Internal Medicine

## 2017-07-08 ENCOUNTER — Ambulatory Visit (INDEPENDENT_AMBULATORY_CARE_PROVIDER_SITE_OTHER)
Admission: RE | Admit: 2017-07-08 | Discharge: 2017-07-08 | Disposition: A | Discharge status: Home / Self Care | Payer: PPO | Source: Ambulatory Visit | Attending: Internal Medicine | Admitting: Internal Medicine

## 2017-07-08 VITALS — BP 124/90 | HR 73 | Temp 98.0°F | Resp 16 | Ht 73.0 in | Wt 202.5 lb

## 2017-07-08 DIAGNOSIS — J988 Other specified respiratory disorders: Secondary | ICD-10-CM

## 2017-07-08 DIAGNOSIS — Z0000 Encounter for general adult medical examination without abnormal findings: Secondary | ICD-10-CM | POA: Diagnosis not present

## 2017-07-08 DIAGNOSIS — R059 Cough, unspecified: Secondary | ICD-10-CM

## 2017-07-08 DIAGNOSIS — R05 Cough: Secondary | ICD-10-CM | POA: Diagnosis not present

## 2017-07-08 MED ORDER — AMOXICILLIN-POT CLAVULANATE 875-125 MG PO TABS: 1.0000 | ORAL_TABLET | Freq: Two times a day (BID) | ORAL | 0 refills | Status: AC

## 2017-07-08 MED ORDER — HYDROCODONE-HOMATROPINE 5-1.5 MG/5ML PO SYRP: 5.0000 mL | ORAL_SOLUTION | Freq: Three times a day (TID) | ORAL | 0 refills | Status: DC | PRN

## 2017-07-08 NOTE — Patient Instructions (Addendum)
Continue doing brain stimulating activities (puzzles, reading, adult coloring books, staying active) to keep memory sharp.   Continue to eat heart healthy diet (full of fruits, vegetables, whole grains, lean protein, water--limit salt, fat, and sugar intake) and increase physical activity as tolerated.   Jonathan Young , Thank you for taking time to come for your Medicare Wellness Visit. I appreciate your ongoing commitment to your health goals. Please review the following plan we discussed and let me know if I can assist you in the future.   These are the goals we discussed: Goals    . Cut back on the amount of alcohol          Watch the amount I consume and cut back       This is a list of the screening recommended for you and due dates:  Health Maintenance  Topic Date Due  . Flu Shot  07/23/2017  . Pneumonia vaccines (2 of 2 - PPSV23) 10/01/2017  . Colon Cancer Screening  02/08/2021  . Tetanus Vaccine  03/17/2026  .  Hepatitis C: One time screening is recommended by Center for Disease Control  (CDC) for  adults born from [birth year] through 1965.   Completed    Cough, Adult Coughing is a reflex that clears your throat and your airways. Coughing helps to heal and protect your lungs. It is normal to cough occasionally, but a cough that happens with other symptoms or lasts a long time may be a sign of a condition that needs treatment. A cough may last only 2-3 weeks (acute), or it may last longer than 8 weeks (chronic). What are the causes? Coughing is commonly caused by:  Breathing in substances that irritate your lungs.  A viral or bacterial respiratory infection.  Allergies.  Asthma.  Postnasal drip.  Smoking.  Acid backing up from the stomach into the esophagus (gastroesophageal reflux).  Certain medicines.  Chronic lung problems, including COPD (or rarely, lung cancer).  Other medical conditions such as heart failure.  Follow these instructions at home: Pay attention to  any changes in your symptoms. Take these actions to help with your discomfort:  Take medicines only as told by your health care provider. ? If you were prescribed an antibiotic medicine, take it as told by your health care provider. Do not stop taking the antibiotic even if you start to feel better. ? Talk with your health care provider before you take a cough suppressant medicine.  Drink enough fluid to keep your urine clear or pale yellow.  If the air is dry, use a cold steam vaporizer or humidifier in your bedroom or your home to help loosen secretions.  Avoid anything that causes you to cough at work or at home.  If your cough is worse at night, try sleeping in a semi-upright position.  Avoid cigarette smoke. If you smoke, quit smoking. If you need help quitting, ask your health care provider.  Avoid caffeine.  Avoid alcohol.  Rest as needed.  Contact a health care provider if:  You have new symptoms.  You cough up pus.  Your cough does not get better after 2-3 weeks, or your cough gets worse.  You cannot control your cough with suppressant medicines and you are losing sleep.  You develop pain that is getting worse or pain that is not controlled with pain medicines.  You have a fever.  You have unexplained weight loss.  You have night sweats. Get help right away if:  You  cough up blood.  You have difficulty breathing.  Your heartbeat is very fast. This information is not intended to replace advice given to you by your health care provider. Make sure you discuss any questions you have with your health care provider. Document Released: 06/07/2011 Document Revised: 05/16/2016 Document Reviewed: 02/15/2015 Elsevier Interactive Patient Education  2017 [employer].

## 2017-07-08 NOTE — Progress Notes (Signed)
Subjective:   Jonathan Young is a 66 y.o. male who presents for Medicare Annual/Subsequent preventive examination.  Review of Systems:  No ROS.  Medicare Wellness Visit. Additional risk factors are reflected in the social history.    Sleep patterns: has frequent nighttime awakenings, has daytime sleepiness, gets up 1-3 times nightly to void and sleeps 4-6 hours nightly.  Patient reports insomnia issues, discussed recommended sleep tips and stress reduction tips, education was in the form of Emmi to patient's email address.  Home Safety/Smoke Alarms: Feels safe in home. Smoke alarms in place.  Living environment; residence and Firearm Safety: 2-story house, no firearms. Lives with roomate, no needs for DME, good support system  Seat Belt Safety/Bike Helmet: Wears seat belt.   Counseling:   Eye Exam- appointment yearly Dental-appointment every 6 months  Male:   CCS- Last 02/09/16, polyp,  recall 5 years   PSA-  Lab Results  Component Value Date   PSA 7.7 05/20/2017   PSA 4.94 10/24/2016   PSA 4.96 (H) 12/29/2014       Objective:    Vitals: There were no vitals taken for this visit.  There is no height or weight on file to calculate BMI.  Tobacco History  Smoking Status  . Former Smoker  . Quit date: 05/27/1981  Smokeless Tobacco  . Never Used     Counseling given: Not Answered   Past Medical History:  Diagnosis Date  . Dysrhythmia    PVC'S  . GERD (gastroesophageal reflux disease)   . Hypercholesteremia   . Osteoarthritis    Past Surgical History:  Procedure Laterality Date  . [place]  . SHOULDER SURGERY     BIL  . TONSILLECTOMY AND ADENOIDECTOMY    . TOTAL HIP ARTHROPLASTY     RT TOAL HIP  . TOTAL HIP ARTHROPLASTY  06/01/2012   Procedure: TOTAL HIP ARTHROPLASTY ANTERIOR APPROACH;  Surgeon: Mauri Pole, MD;  Location: [facility];  Service: Orthopedics;  Laterality: Left;   Family History  Problem Relation Age of Onset  . Emphysema Mother    . Colon cancer Neg Hx   . Esophageal cancer Neg Hx   . Rectal cancer Neg Hx   . Stomach cancer Neg Hx    History  Sexual Activity  . Sexual activity: Not on file    Outpatient Encounter Prescriptions as of 07/08/2017  Medication Sig  . aspirin 81 MG tablet Take 81 mg by mouth daily.  Marland Kitchen CIALIS 20 MG tablet TAKE 1 TABLET BY MOUTH ONE HOUR PRIOR TO INTERCOURSE; MAXIMUM 1 TABLET IN 24 HOURS !  . Cyanocobalamin (B-12 DOTS) 500 MCG TBDP Take 500 mcg by mouth daily.    . fish oil-omega-3 fatty acids 1000 MG capsule Take 1 g by mouth daily.  . meloxicam (MOBIC) 15 MG tablet take 1 tablet by mouth once daily  . pantoprazole (PROTONIX) 40 MG tablet take 1 tablet by mouth once daily  . rosuvastatin (CRESTOR) 10 MG tablet take 1 tablet by mouth once daily  . valACYclovir (VALTREX) 1000 MG tablet Take 500 mg by mouth daily.   . Vitamin D, Ergocalciferol, (DRISDOL) 50000 units CAPS capsule take 1 capsule by mouth every week   No facility-administered encounter medications on file as of 07/08/2017.     Activities of Daily Living No flowsheet data found.  Patient Care Team: Janith Lima, MD as PCP - General (Internal Medicine)   Assessment:    Physical assessment deferred to PCP.  Exercise Activities and Dietary recommendations   Diet (meal preparation, eat out, water intake, caffeinated beverages, dairy products, fruits and vegetables): in general, a "healthy" diet  , well balanced, eats a variety of fruits and vegetables daily, limits salt, fat/cholesterol, sugar, caffeine, drinks 6-8 glasses of water daily.   Goals    None     Fall Risk Fall Risk  04/04/2016  Falls in the past year? Yes  Number falls in past yr: 1  Injury with Fall? Yes  Risk for fall due to : History of fall(s)  Follow up Education provided   Depression Screen PHQ 2/9 Scores 04/04/2016  PHQ - 2 Score 0    Cognitive Function       Ad8 score reviewed for issues:  Issues making decisions: no  Less  interest in hobbies / activities: no  Repeats questions, stories (family complaining): no  Trouble using ordinary gadgets (microwave, computer, phone):no  Forgets the month or year: no  Mismanaging finances: no  Remembering appts: no  Daily problems with thinking and/or memory: no Ad8 score is= 0    Immunization History  Administered Date(s) Administered  . Influenza Split 09/17/2012, 08/24/2013  . Influenza Whole 10/23/2000, 09/28/2009, 09/20/2010  . Influenza,inj,Quad PF,36+ Mos 08/24/2014, 08/18/2015  . Influenza-Unspecified 10/01/2016  . Pneumococcal Conjugate-13 10/01/2016  . Td 12/23/2005  . Tdap 03/17/2016  . Zoster 01/16/2011   Screening Tests Health Maintenance  Topic Date Due  . INFLUENZA VACCINE  07/23/2017  . PNA vac Low Risk Adult (2 of 2 - PPSV23) 10/01/2017  . COLONOSCOPY  02/08/2021  . TETANUS/TDAP  03/17/2026  . Hepatitis C Screening  Completed      Plan:    Continue doing brain stimulating activities (puzzles, reading, adult coloring books, staying active) to keep memory sharp.   Continue to eat heart healthy diet (full of fruits, vegetables, whole grains, lean protein, water--limit salt, fat, and sugar intake) and increase physical activity as tolerated.   I have personally reviewed and noted the following in the patient's chart:   . Medical and social history . Use of alcohol, tobacco or illicit drugs  . Current medications and supplements . Functional ability and status . Nutritional status . Physical activity . Advanced directives . List of other physicians . Vitals . Screenings to include cognitive, depression, and falls . Referrals and appointments  In addition, I have reviewed and discussed with patient certain preventive protocols, quality metrics, and best practice recommendations. A written personalized care plan for preventive services as well as general preventive health recommendations were provided to patient.     Michiel Cowboy, RN  07/08/2017   Medical screening examination/treatment/procedure(s) were performed by non-physician practitioner and as supervising physician I was immediately available for consultation/collaboration. I agree with above. Scarlette Calico, MD

## 2017-07-08 NOTE — Progress Notes (Signed)
Pre visit review using our clinic review tool, if applicable. No additional management support is needed unless otherwise documented below in the visit note. 

## 2017-07-09 NOTE — Progress Notes (Signed)
Subjective:  Patient ID: Jonathan Young, male    DOB: [birth year]  Age: 66 y.o. MRN: [id number]  CC: Cough and Medicare Wellness   HPI Jonathan Young presents for a 3 week hx of cough and congestion. The cough is productive of yellow phlegm. He's had no episodes of fever, chills, night sweats, wheezing, or shortness of breath. He complains of fatigue.  Outpatient Medications Prior to Visit  Medication Sig Dispense Refill  . aspirin 81 MG tablet Take 81 mg by mouth daily.    Marland Kitchen CIALIS 20 MG tablet TAKE 1 TABLET BY MOUTH ONE HOUR PRIOR TO INTERCOURSE; MAXIMUM 1 TABLET IN 24 HOURS ! 10 tablet 5  . Cyanocobalamin (B-12 DOTS) 500 MCG TBDP Take 500 mcg by mouth daily.      . fish oil-omega-3 fatty acids 1000 MG capsule Take 1 g by mouth daily.    . meloxicam (MOBIC) 15 MG tablet take 1 tablet by mouth once daily 30 tablet 3  . pantoprazole (PROTONIX) 40 MG tablet take 1 tablet by mouth once daily 90 tablet 2  . rosuvastatin (CRESTOR) 10 MG tablet take 1 tablet by mouth once daily 90 tablet 2  . valACYclovir (VALTREX) 1000 MG tablet Take 500 mg by mouth daily.     . Vitamin D, Ergocalciferol, (DRISDOL) 50000 units CAPS capsule take 1 capsule by mouth every week 12 capsule 3   No facility-administered medications prior to visit.     ROS Review of Systems  Constitutional: Positive for fatigue. Negative for activity change, appetite change, chills, diaphoresis, fever and unexpected weight change.  HENT: Negative.   Respiratory: Positive for cough. Negative for choking, chest tightness, shortness of breath, wheezing and stridor.   Cardiovascular: Negative.  Negative for chest pain, palpitations and leg swelling.  Gastrointestinal: Negative for abdominal pain, constipation, diarrhea, nausea and vomiting.  Endocrine: Negative.   Genitourinary: Negative.  Negative for difficulty urinating.  Musculoskeletal: Negative.  Negative for back pain, myalgias and neck pain.  Skin: Negative.  Negative for  color change and rash.  Allergic/Immunologic: Negative.   Neurological: Negative.  Negative for dizziness, weakness and headaches.  Hematological: Negative for adenopathy. Does not bruise/bleed easily.  Psychiatric/Behavioral: Negative.     Objective:  BP 124/90 (BP Location: Left Arm, Patient Position: Sitting, Cuff Size: Normal)   Pulse 73   Temp 98 F (36.7 C) (Oral)   Resp 16   Ht 6\' 1"  (1.854 m)   Wt 202 lb 8 oz (91.9 kg)   SpO2 98%   BMI 26.72 kg/m   BP Readings from Last 3 Encounters:  07/08/17 124/90  04/03/17 (!) 142/88  11/07/16 140/90    Wt Readings from Last 3 Encounters:  07/08/17 202 lb 8 oz (91.9 kg)  04/03/17 201 lb 8 oz (91.4 kg)  11/07/16 204 lb (92.5 kg)    Physical Exam  Constitutional: He is oriented to person, place, and time.  Non-toxic appearance. He does not have a sickly appearance. He does not appear ill. No distress.  HENT:  Mouth/Throat: Oropharynx is clear and moist. Mucous membranes are not pale, not dry and not cyanotic. No oral lesions. No trismus in the jaw. No uvula swelling. No oropharyngeal exudate, posterior oropharyngeal edema, posterior oropharyngeal erythema or tonsillar abscesses.  Eyes: Conjunctivae are normal. Right eye exhibits no discharge. Left eye exhibits no discharge. No scleral icterus.  Neck: Normal range of motion. Neck supple. No JVD present. No thyromegaly present.  Cardiovascular: Normal rate, regular rhythm and  intact distal pulses.  Exam reveals no gallop and no friction rub.   No murmur heard. Pulmonary/Chest: Effort normal and breath sounds normal. No respiratory distress. He has no wheezes. He has no rales. He exhibits no tenderness.  Abdominal: Soft. Bowel sounds are normal. He exhibits no distension and no mass. There is no tenderness. There is no rebound and no guarding.  Musculoskeletal: Normal range of motion. He exhibits no edema, tenderness or deformity.  Lymphadenopathy:    He has no cervical adenopathy.    Neurological: He is alert and oriented to person, place, and time.  Skin: Skin is warm and dry. No rash noted. He is not diaphoretic. No erythema. No pallor.  Vitals reviewed.   Lab Results  Component Value Date   WBC 6.4 04/03/2017   HGB 15.3 04/03/2017   HCT 45.7 04/03/2017   PLT 248.0 04/03/2017   GLUCOSE 108 (H) 04/03/2017   CHOL 158 04/03/2017   TRIG 79.0 04/03/2017   HDL 46.10 04/03/2017   LDLDIRECT 87.0 04/03/2016   LDLCALC 96 04/03/2017   ALT 28 11/07/2016   AST 29 11/07/2016   NA 142 04/03/2017   K 5.2 (H) 04/03/2017   CL 107 04/03/2017   CREATININE 1.48 04/03/2017   BUN 19 04/03/2017   CO2 29 04/03/2017   TSH 1.18 11/07/2016   PSA 7.7 05/20/2017   INR 0.94 05/27/2012   HGBA1C 5.8 04/03/2016    Dg Chest 2 View  Result Date: 07/08/2017 CLINICAL DATA:  Dry cough for 1 month. EXAM: CHEST  2 VIEW COMPARISON:  Chest x-ray 05/27/2012 FINDINGS: The cardiac silhouette, mediastinal and hilar contours are within normal limits and stable. The lungs are clear. No pleural effusion. The bony thorax is intact. IMPRESSION: Normal chest x-ray. Electronically Signed   By: Marijo Sanes M.D.   On: 07/08/2017 13:38    Assessment & Plan:   Jonathan Young was seen today for cough and medicare wellness.  Diagnoses and all orders for this visit:  Cough- his CXR is normal, there is no evidence of asthma, will treat for respiratory tract infection. -     DG Chest 2 View; Future -     HYDROcodone-homatropine (HYCODAN) 5-1.5 MG/5ML syrup; Take 5 mLs by mouth every 8 (eight) hours as needed for cough.  RTI (respiratory tract infection)- I will treat the infection with Augmentin and will control the cough with Hycodan. -     amoxicillin-clavulanate (AUGMENTIN) 875-125 MG tablet; Take 1 tablet by mouth 2 (two) times daily. -     HYDROcodone-homatropine (HYCODAN) 5-1.5 MG/5ML syrup; Take 5 mLs by mouth every 8 (eight) hours as needed for cough.  Encounter for Medicare annual wellness exam   I  am having Jonathan Young start on amoxicillin-clavulanate and HYDROcodone-homatropine. I am also having him maintain his valACYclovir, Cyanocobalamin, fish oil-omega-3 fatty acids, aspirin, CIALIS, pantoprazole, rosuvastatin, meloxicam, and Vitamin D (Ergocalciferol).  Meds ordered this encounter  Medications  . amoxicillin-clavulanate (AUGMENTIN) 875-125 MG tablet    Sig: Take 1 tablet by mouth 2 (two) times daily.    Dispense:  20 tablet    Refill:  0  . HYDROcodone-homatropine (HYCODAN) 5-1.5 MG/5ML syrup    Sig: Take 5 mLs by mouth every 8 (eight) hours as needed for cough.    Dispense:  120 mL    Refill:  0     Follow-up: Return in about 3 weeks (around 07/29/2017).  Scarlette Calico, MD

## 2017-07-15 DIAGNOSIS — J329 Chronic sinusitis, unspecified: Secondary | ICD-10-CM | POA: Diagnosis not present

## 2017-07-17 DIAGNOSIS — H524 Presbyopia: Secondary | ICD-10-CM | POA: Diagnosis not present

## 2017-07-24 ENCOUNTER — Other Ambulatory Visit: Payer: Self-pay | Admitting: Internal Medicine

## 2017-07-24 DIAGNOSIS — M150 Primary generalized (osteo)arthritis: Principal | ICD-10-CM

## 2017-07-24 DIAGNOSIS — M159 Polyosteoarthritis, unspecified: Secondary | ICD-10-CM

## 2017-08-20 ENCOUNTER — Encounter: Payer: Self-pay | Admitting: Internal Medicine

## 2017-08-20 ENCOUNTER — Encounter (HOSPITAL_COMMUNITY): Payer: Self-pay | Admitting: *Deleted

## 2017-08-20 ENCOUNTER — Emergency Department (HOSPITAL_BASED_OUTPATIENT_CLINIC_OR_DEPARTMENT_OTHER): Admit: 2017-08-20 | Discharge: 2017-08-20 | Disposition: A | Discharge status: Home / Self Care | Payer: PPO

## 2017-08-20 ENCOUNTER — Ambulatory Visit (HOSPITAL_COMMUNITY)
Admission: EM | Admit: 2017-08-20 | Discharge: 2017-08-20 | Disposition: A | Discharge status: Home / Self Care | Payer: PPO | Attending: Family Medicine | Admitting: Family Medicine

## 2017-08-20 ENCOUNTER — Emergency Department (HOSPITAL_COMMUNITY)
Admission: EM | Admit: 2017-08-20 | Discharge: 2017-08-20 | Disposition: A | Discharge status: Home / Self Care | Payer: PPO | Attending: Emergency Medicine | Admitting: Emergency Medicine

## 2017-08-20 ENCOUNTER — Encounter (HOSPITAL_COMMUNITY): Payer: Self-pay | Admitting: Emergency Medicine

## 2017-08-20 DIAGNOSIS — M7989 Other specified soft tissue disorders: Secondary | ICD-10-CM

## 2017-08-20 DIAGNOSIS — M79661 Pain in right lower leg: Secondary | ICD-10-CM

## 2017-08-20 DIAGNOSIS — Z5321 Procedure and treatment not carried out due to patient leaving prior to being seen by health care provider: Secondary | ICD-10-CM | POA: Diagnosis not present

## 2017-08-20 DIAGNOSIS — M79604 Pain in right leg: Secondary | ICD-10-CM | POA: Diagnosis not present

## 2017-08-20 NOTE — Discharge Instructions (Signed)
Go to the ED for further evaluation and treatment of your right leg swelling and pain.

## 2017-08-20 NOTE — Progress Notes (Signed)
*  PRELIMINARY RESULTS* Vascular Ultrasound Right lower extremity venous duplex has been completed.  Preliminary findings: No evidence of deep vein thrombosis involving the visualized veins of the right lower extremity. Mildly  heterogeneous fluid collection seen proximal right calf, medial aspect- etiology unknown.  Jonathan Young 08/20/2017, 6:28 PM

## 2017-08-20 NOTE — Telephone Encounter (Signed)
Patient has called in.  I have scheduled him with Dr. Ronnald Ramp for tomorrow at 1:45pm.  Patient would like a call back if there is anything else he needs to do or if he needs to come in sooner.

## 2017-08-20 NOTE — ED Provider Notes (Signed)
[place]    CSN: [id number] Arrival date & time: 08/20/17  1556     History   Chief Complaint Chief Complaint  Patient presents with  . Leg Pain    HPI Jonathan Young is a 66 y.o. male.   66 year old male with history of GERD, hypercholesterolemia, osteoarthritis, comes in for one-week history of right calf pain with swelling. Patient states he works out regularly, he thought it was muscle soreness to begin with. However, these past few days he has had increasing swelling, with leg tightness of the right leg. Denies any injury. Denies increased warmth, spreading erythema. Denies chest pain, palpitations. States he has shortness of breath at baseline, will increase activity will onset shortness of breath. He is a former smoker, quit 35 years ago, has a 28-pack-year history.       Past Medical History:  Diagnosis Date  . Dysrhythmia    PVC'S  . GERD (gastroesophageal reflux disease)   . Hypercholesteremia   . Osteoarthritis     Patient Active Problem List   Diagnosis Date Noted  . Cough 07/08/2017  . RTI (respiratory tract infection) 07/08/2017  . Myelopathy concurrent with and due to spinal stenosis of cervical region ([place]) 06/29/2017  . Maxillary sinusitis, chronic 06/29/2017  . Paresthesias/numbness 06/16/2017  . CRI (chronic renal insufficiency), stage 3 (moderate) 04/07/2017  . Pure hyperglyceridemia 04/03/2017  . Essential hypertension 11/07/2016  . Hyperglycemia 04/03/2016  . Need for prophylactic chemotherapy 10/17/2015  . Special screening for malignant neoplasms, colon 10/17/2015  . GAD (generalized anxiety disorder) 10/17/2015  . Erectile dysfunction of organic origin 10/10/2015  . Screening for colon cancer 10/10/2015  . PSA elevation 01/10/2015  . Routine general medical examination at a health care facility 12/29/2014  . GERD (gastroesophageal reflux disease) 04/23/2011  . IBS (irritable bowel syndrome) 04/23/2011  . Vitamin D deficiency  01/16/2011  . DUPUYTREN'S CONTRACTURE 01/16/2011  . Hypogonadism male 07/25/2010  . IDIOPATHIC OSTEOPOROSIS 11/01/2008  . ANXIETY DEPRESSION 07/26/2008  . Vitamin B 12 deficiency 03/22/2008  . Osteoarthritis 08/25/2007  . Hyperlipidemia with target LDL less than 130 08/03/2007    Past Surgical History:  Procedure Laterality Date  . [place]  . SHOULDER SURGERY     BIL  . TONSILLECTOMY AND ADENOIDECTOMY    . TOTAL HIP ARTHROPLASTY     RT TOAL HIP  . TOTAL HIP ARTHROPLASTY  06/01/2012   Procedure: TOTAL HIP ARTHROPLASTY ANTERIOR APPROACH;  Surgeon: Mauri Pole, MD;  Location: [facility];  Service: Orthopedics;  Laterality: Left;       Home Medications    Prior to Admission medications   Medication Sig Start Date End Date Taking? Authorizing Provider  aspirin 81 MG tablet Take 81 mg by mouth daily.    [provider]  CIALIS 20 MG tablet TAKE 1 TABLET BY MOUTH ONE HOUR PRIOR TO INTERCOURSE; MAXIMUM 1 TABLET IN 24 HOURS ! 11/01/16   Janith Lima, MD  Cyanocobalamin (B-12 DOTS) 500 MCG TBDP Take 500 mcg by mouth daily.      [provider]  fish oil-omega-3 fatty acids 1000 MG capsule Take 1 g by mouth daily.    [provider]  HYDROcodone-homatropine (HYCODAN) 5-1.5 MG/5ML syrup Take 5 mLs by mouth every 8 (eight) hours as needed for cough. 07/08/17   Janith Lima, MD  meloxicam [facility]) 15 MG tablet take 1 tablet by mouth once daily 07/24/17   Janith Lima, MD  pantoprazole ([place])  40 MG tablet take 1 tablet by mouth once daily 02/18/17   Janith Lima, MD  rosuvastatin (CRESTOR) 10 MG tablet take 1 tablet by mouth once daily 02/18/17   Janith Lima, MD  valACYclovir (VALTREX) 1000 MG tablet Take 500 mg by mouth daily.     [provider]  Vitamin D, Ergocalciferol, (DRISDOL) 50000 units CAPS capsule take 1 capsule by mouth every week 05/21/17   Janith Lima, MD    Family History Family History  Problem Relation  Age of Onset  . Emphysema Mother   . Colon cancer Neg Hx   . Esophageal cancer Neg Hx   . Rectal cancer Neg Hx   . Stomach cancer Neg Hx     Social History Social History  Substance Use Topics  . Smoking status: Former Smoker    Quit date: 05/27/1981  . Smokeless tobacco: Never Used  . Alcohol use 8.4 oz/week    14 Shots of liquor per week     Allergies   Ezetimibe-simvastatin   Review of Systems Review of Systems  Reason unable to perform ROS: See HPI as above.     Physical Exam Triage Vital Signs ED Triage Vitals  Enc Vitals Group     BP 08/20/17 1632 (!) 125/92     Pulse Rate 08/20/17 1632 70     Resp 08/20/17 1632 20     Temp 08/20/17 1632 97.9 F (36.6 C)     Temp Source 08/20/17 1632 Oral     SpO2 08/20/17 1632 96 %     Weight --      Height --      Head Circumference --      Peak Flow --      Pain Score 08/20/17 1629 4     Pain Loc --      Pain Edu? --      Excl. in [place]? --    No data found.   Updated Vital Signs BP 120/70 (BP Location: Left Arm)   Pulse 70   Temp 97.9 F (36.6 C) (Oral)   Resp 20   SpO2 96%   Visual Acuity Right Eye Distance:   Left Eye Distance:   Bilateral Distance:    Right Eye Near:   Left Eye Near:    Bilateral Near:     Physical Exam  Constitutional: He is oriented to person, place, and time. He appears well-developed and well-nourished. No distress.  HENT:  Head: Normocephalic and atraumatic.  Cardiovascular: Normal rate, regular rhythm and normal heart sounds.  Exam reveals no gallop and no friction rub.   No murmur heard. Pulmonary/Chest: Effort normal and breath sounds normal. He has no wheezes. He has no rales.  Musculoskeletal:  No pain on palpation of bilateral knees. Tenderness on palpation of right calf and anterior leg. No palpable cord, calf tightness noted. No obvious erythema or increased warmth. Calf is measured at 17 inches on the right, 16.5 inches on the left.  Pitting edema noted on the right  lower extremity. Strength normal and equal bilaterally. Sensation intact and equal bilaterally.  Neurological: He is alert and oriented to person, place, and time.     UC Treatments / Results  Labs (all labs ordered are listed, but only abnormal results are displayed) Labs Reviewed - No data to display  EKG  EKG Interpretation None       Radiology No results found.  Procedures Procedures (including critical care time)  Medications Ordered in  UC Medications - No data to display   Initial Impression / Assessment and Plan / UC Course  I have reviewed the triage vital signs and the nursing notes.  Pertinent labs & imaging results that were available during my care of the patient were reviewed by me and considered in my medical decision making (see chart for details).    Discussed with patient given symptom worsening, with right calf pain and swelling, cannot rule out DVT. Patient discharge in stable condition to the ED for further evaluation.  Case discussed with Dr. Mannie Stabile, who agrees to plan.  Final Clinical Impressions(s) / UC Diagnoses   Final diagnoses:  Right leg swelling  Right calf pain    New Prescriptions Discharge Medication List as of 08/20/2017  5:06 PM         Ok Edwards, PA-C 08/20/17 1716

## 2017-08-20 NOTE — ED Notes (Signed)
U/S in pt's room.

## 2017-08-20 NOTE — ED Notes (Signed)
Pt states that he does not want to stay for test results because he has an appt with his doctor at [facility] tomorrow afternoon.

## 2017-08-20 NOTE — ED Triage Notes (Signed)
Right calf is tight with walking.  Patient usually works out , but last week slowed down on working out since he was feeling tight all over in general.  Patient says leg discomfort is not from an injury and nothing he has ever felt from his working out routine.  Soreness and swelling in right lower leg

## 2017-08-20 NOTE — ED Triage Notes (Signed)
Pt sent here from [facility] for right lower leg pain. Sent to r/o dvt. Denies injury to leg. Ambulatory at triage.

## 2017-08-21 ENCOUNTER — Ambulatory Visit (INDEPENDENT_AMBULATORY_CARE_PROVIDER_SITE_OTHER): Payer: PPO | Admitting: Internal Medicine

## 2017-08-21 ENCOUNTER — Encounter: Payer: Self-pay | Admitting: Internal Medicine

## 2017-08-21 VITALS — BP 134/90 | HR 81 | Temp 98.4°F | Resp 16 | Wt 205.0 lb

## 2017-08-21 DIAGNOSIS — M79661 Pain in right lower leg: Secondary | ICD-10-CM

## 2017-08-21 DIAGNOSIS — M7989 Other specified soft tissue disorders: Secondary | ICD-10-CM

## 2017-08-21 DIAGNOSIS — I10 Essential (primary) hypertension: Secondary | ICD-10-CM | POA: Diagnosis not present

## 2017-08-21 NOTE — Progress Notes (Signed)
Subjective:  Patient ID: Jonathan Young, male    DOB: June 29, 1951  Age: 66 y.o. MRN: [id number]  CC: Hypertension   HPI NICCOLO BURGGRAF presents for concerns about pain/swelling over his RLE for 1 week. He is very active but denies a specific trauma or injury. The area feels "tight and sore."  He went to the ED last night and was not seen but did have an U/S done (see report below).  Outpatient Medications Prior to Visit  Medication Sig Dispense Refill  . aspirin 81 MG tablet Take 81 mg by mouth daily.    Marland Kitchen CIALIS 20 MG tablet TAKE 1 TABLET BY MOUTH ONE HOUR PRIOR TO INTERCOURSE; MAXIMUM 1 TABLET IN 24 HOURS ! 10 tablet 5  . Cyanocobalamin (B-12 DOTS) 500 MCG TBDP Take 500 mcg by mouth daily.      . fish oil-omega-3 fatty acids 1000 MG capsule Take 1 g by mouth daily.    . meloxicam (MOBIC) 15 MG tablet take 1 tablet by mouth once daily 30 tablet 3  . pantoprazole (PROTONIX) 40 MG tablet take 1 tablet by mouth once daily 90 tablet 2  . rosuvastatin (CRESTOR) 10 MG tablet take 1 tablet by mouth once daily 90 tablet 2  . valACYclovir (VALTREX) 1000 MG tablet Take 500 mg by mouth daily.     . Vitamin D, Ergocalciferol, (DRISDOL) 50000 units CAPS capsule take 1 capsule by mouth every week 12 capsule 3  . HYDROcodone-homatropine (HYCODAN) 5-1.5 MG/5ML syrup Take 5 mLs by mouth every 8 (eight) hours as needed for cough. 120 mL 0   No facility-administered medications prior to visit.     ROS Review of Systems  Constitutional: Negative.  Negative for appetite change, diaphoresis and fatigue.  HENT: Negative.  Negative for sore throat.   Eyes: Negative.   Respiratory: Negative.  Negative for cough, chest tightness, shortness of breath and wheezing.   Cardiovascular: Positive for leg swelling. Negative for chest pain and palpitations.  Gastrointestinal: Negative for abdominal pain, constipation, diarrhea, nausea and vomiting.  Endocrine: Negative.   Genitourinary: Negative.  Negative for  difficulty urinating, frequency and urgency.  Musculoskeletal: Positive for arthralgias. Negative for back pain, myalgias and neck pain.  Skin: Negative.  Negative for color change and rash.  Allergic/Immunologic: Negative.   Neurological: Negative.  Negative for dizziness and weakness.  Hematological: Negative for adenopathy. Does not bruise/bleed easily.  Psychiatric/Behavioral: Negative.     Objective:  BP 134/90   Pulse 81   Temp 98.4 F (36.9 C) (Oral)   Resp 16   Wt 205 lb (93 kg)   SpO2 97%   BMI 27.05 kg/m   BP Readings from Last 3 Encounters:  08/21/17 134/90  08/20/17 (!) 136/110  08/20/17 120/70    Wt Readings from Last 3 Encounters:  08/21/17 205 lb (93 kg)  07/08/17 202 lb 8 oz (91.9 kg)  04/03/17 201 lb 8 oz (91.4 kg)    Physical Exam  Constitutional: He is oriented to person, place, and time. No distress.  HENT:  Mouth/Throat: Oropharynx is clear and moist. No oropharyngeal exudate.  Eyes: Conjunctivae are normal. Right eye exhibits no discharge. Left eye exhibits no discharge. No scleral icterus.  Neck: Normal range of motion. Neck supple. No JVD present. No thyromegaly present.  Cardiovascular: Normal rate, regular rhythm and intact distal pulses.  Exam reveals no gallop and no friction rub.   No murmur heard. Pulmonary/Chest: Effort normal and breath sounds normal. No respiratory distress.  He has no wheezes. He has no rales. He exhibits no tenderness.  Abdominal: Soft. Bowel sounds are normal. He exhibits no distension and no mass. There is no tenderness. There is no rebound and no guarding.  Musculoskeletal: Normal range of motion. He exhibits edema. He exhibits no tenderness or deformity.       Right knee: Normal. He exhibits normal range of motion, no swelling, no effusion, no deformity, no erythema and normal alignment. No tenderness found.       Right lower leg: He exhibits swelling. He exhibits no tenderness, no bony tenderness, no edema and no  deformity.  Trace pitting edema over the RLE No edema over the LLE  Lymphadenopathy:    He has no cervical adenopathy.  Neurological: He is alert and oriented to person, place, and time.  Skin: Skin is warm and dry. No rash noted. He is not diaphoretic. No erythema. No pallor.  Vitals reviewed.   Lab Results  Component Value Date   WBC 6.4 04/03/2017   HGB 15.3 04/03/2017   HCT 45.7 04/03/2017   PLT 248.0 04/03/2017   GLUCOSE 108 (H) 04/03/2017   CHOL 158 04/03/2017   TRIG 79.0 04/03/2017   HDL 46.10 04/03/2017   LDLDIRECT 87.0 04/03/2016   LDLCALC 96 04/03/2017   ALT 28 11/07/2016   AST 29 11/07/2016   NA 142 04/03/2017   K 5.2 (H) 04/03/2017   CL 107 04/03/2017   CREATININE 1.48 04/03/2017   BUN 19 04/03/2017   CO2 29 04/03/2017   TSH 1.18 11/07/2016   PSA 7.7 05/20/2017   INR 0.94 05/27/2012   HGBA1C 5.8 04/03/2016   Summary: No evidence of deep vein thrombosis involving the visualized veins of the right lower extremity. Mildly heterogeneous fluid collection seen proximal right calf, medial aspect- etiology unknown.  No results found.  Assessment & Plan:   Carrson was seen today for hypertension.  Diagnoses and all orders for this visit:  Essential hypertension- his BP is well controlled  Pain and swelling of lower leg, right- u/s is neg for DVT or baker's cyst, there is a vague fluid collection c/w soft tissue injury, he will rest/ice/elevate the RLE and will let me know of any change in his sx's   I have discontinued Mr. Findling HYDROcodone-homatropine. I am also having him maintain his valACYclovir, Cyanocobalamin, fish oil-omega-3 fatty acids, aspirin, CIALIS, pantoprazole, rosuvastatin, Vitamin D (Ergocalciferol), and meloxicam.  No orders of the defined types were placed in this encounter.    Follow-up: No Follow-up on file.  Scarlette Calico, MD

## 2017-08-22 ENCOUNTER — Encounter: Payer: Self-pay | Admitting: Internal Medicine

## 2017-08-22 DIAGNOSIS — M7989 Other specified soft tissue disorders: Secondary | ICD-10-CM

## 2017-08-22 DIAGNOSIS — M79661 Pain in right lower leg: Secondary | ICD-10-CM | POA: Insufficient documentation

## 2017-08-22 NOTE — Patient Instructions (Signed)

## 2017-08-26 ENCOUNTER — Other Ambulatory Visit: Payer: Self-pay | Admitting: Internal Medicine

## 2017-09-02 DIAGNOSIS — M5020 Other cervical disc displacement, unspecified cervical region: Secondary | ICD-10-CM | POA: Diagnosis not present

## 2017-09-02 DIAGNOSIS — M542 Cervicalgia: Secondary | ICD-10-CM | POA: Diagnosis not present

## 2017-09-02 DIAGNOSIS — M5412 Radiculopathy, cervical region: Secondary | ICD-10-CM | POA: Diagnosis not present

## 2017-09-02 DIAGNOSIS — M4722 Other spondylosis with radiculopathy, cervical region: Secondary | ICD-10-CM | POA: Diagnosis not present

## 2017-09-02 DIAGNOSIS — M5030 Other cervical disc degeneration, unspecified cervical region: Secondary | ICD-10-CM | POA: Diagnosis not present

## 2017-10-09 DIAGNOSIS — M25511 Pain in right shoulder: Secondary | ICD-10-CM | POA: Diagnosis not present

## 2017-10-17 DIAGNOSIS — D225 Melanocytic nevi of trunk: Secondary | ICD-10-CM | POA: Diagnosis not present

## 2017-10-17 DIAGNOSIS — Z23 Encounter for immunization: Secondary | ICD-10-CM | POA: Diagnosis not present

## 2017-10-17 DIAGNOSIS — Z85828 Personal history of other malignant neoplasm of skin: Secondary | ICD-10-CM | POA: Diagnosis not present

## 2017-10-17 DIAGNOSIS — D1800 Hemangioma unspecified site: Secondary | ICD-10-CM | POA: Diagnosis not present

## 2017-10-17 DIAGNOSIS — D485 Neoplasm of uncertain behavior of skin: Secondary | ICD-10-CM | POA: Diagnosis not present

## 2017-10-17 DIAGNOSIS — L821 Other seborrheic keratosis: Secondary | ICD-10-CM | POA: Diagnosis not present

## 2017-10-17 DIAGNOSIS — L814 Other melanin hyperpigmentation: Secondary | ICD-10-CM | POA: Diagnosis not present

## 2017-11-27 ENCOUNTER — Other Ambulatory Visit: Payer: Self-pay | Admitting: Internal Medicine

## 2017-11-27 DIAGNOSIS — K2100 Gastro-esophageal reflux disease with esophagitis, without bleeding: Secondary | ICD-10-CM

## 2017-11-27 DIAGNOSIS — N529 Male erectile dysfunction, unspecified: Secondary | ICD-10-CM

## 2017-11-27 MED ORDER — TADALAFIL 20 MG PO TABS: ORAL_TABLET | ORAL | 5 refills | Status: DC

## 2017-11-27 MED ORDER — PANTOPRAZOLE SODIUM 40 MG PO TBEC: 40.0000 mg | DELAYED_RELEASE_TABLET | Freq: Every day | ORAL | 1 refills | Status: DC

## 2017-12-03 DIAGNOSIS — M5020 Other cervical disc displacement, unspecified cervical region: Secondary | ICD-10-CM | POA: Diagnosis not present

## 2017-12-03 DIAGNOSIS — M5030 Other cervical disc degeneration, unspecified cervical region: Secondary | ICD-10-CM | POA: Diagnosis not present

## 2017-12-03 DIAGNOSIS — M5412 Radiculopathy, cervical region: Secondary | ICD-10-CM | POA: Diagnosis not present

## 2017-12-03 DIAGNOSIS — M4722 Other spondylosis with radiculopathy, cervical region: Secondary | ICD-10-CM | POA: Diagnosis not present

## 2017-12-04 DIAGNOSIS — M50322 Other cervical disc degeneration at C5-C6 level: Secondary | ICD-10-CM | POA: Diagnosis not present

## 2017-12-04 DIAGNOSIS — M50122 Cervical disc disorder at C5-C6 level with radiculopathy: Secondary | ICD-10-CM | POA: Diagnosis not present

## 2017-12-04 DIAGNOSIS — M50222 Other cervical disc displacement at C5-C6 level: Secondary | ICD-10-CM | POA: Diagnosis not present

## 2017-12-04 DIAGNOSIS — M47812 Spondylosis without myelopathy or radiculopathy, cervical region: Secondary | ICD-10-CM | POA: Diagnosis not present

## 2017-12-04 DIAGNOSIS — M5020 Other cervical disc displacement, unspecified cervical region: Secondary | ICD-10-CM | POA: Diagnosis not present

## 2017-12-23 HISTORY — PX: CERVICAL SPINE SURGERY: SHX589

## 2017-12-25 ENCOUNTER — Telehealth: Payer: Self-pay

## 2017-12-25 ENCOUNTER — Other Ambulatory Visit: Payer: Self-pay | Admitting: Internal Medicine

## 2017-12-25 DIAGNOSIS — M150 Primary generalized (osteo)arthritis: Principal | ICD-10-CM

## 2017-12-25 DIAGNOSIS — M159 Polyosteoarthritis, unspecified: Secondary | ICD-10-CM

## 2017-12-25 MED ORDER — MELOXICAM 15 MG PO TABS: 15.0000 mg | ORAL_TABLET | Freq: Every day | ORAL | 3 refills | Status: DC

## 2017-12-25 NOTE — Telephone Encounter (Signed)
Patient request refill on Meloxcam 15 mg, submitted by [employer] on 12/22/17. Patient's LOV was 08/21/17. Please advise regarding refill. Thanks

## 2017-12-25 NOTE — Telephone Encounter (Signed)
done

## 2017-12-26 DIAGNOSIS — M5020 Other cervical disc displacement, unspecified cervical region: Secondary | ICD-10-CM | POA: Diagnosis not present

## 2017-12-29 NOTE — Telephone Encounter (Signed)
Spoke with patient today. Informed him that script for Meloxicam was faxed back on 12/22/17. Asked patient if he had been able to pick up script yet. He stated he had not picked up script and had not been notified by pharmacy of script. Confirmed that script was sent to [facility]. Patient stated he would give them a call and see if script was ready. Informed patient that if script hadn't been received to call us back and we could resend in script for him in needed.

## 2018-01-01 DIAGNOSIS — R972 Elevated prostate specific antigen [PSA]: Secondary | ICD-10-CM | POA: Diagnosis not present

## 2018-01-22 ENCOUNTER — Other Ambulatory Visit: Payer: Self-pay | Admitting: Urology

## 2018-01-22 DIAGNOSIS — R972 Elevated prostate specific antigen [PSA]: Secondary | ICD-10-CM

## 2018-02-12 ENCOUNTER — Other Ambulatory Visit: Payer: Self-pay | Admitting: Urology

## 2018-02-12 ENCOUNTER — Ambulatory Visit
Admission: RE | Admit: 2018-02-12 | Discharge: 2018-02-12 | Disposition: A | Discharge status: Home / Self Care | Payer: PPO | Source: Ambulatory Visit | Attending: Urology | Admitting: Urology

## 2018-02-12 DIAGNOSIS — R972 Elevated prostate specific antigen [PSA]: Secondary | ICD-10-CM

## 2018-02-23 ENCOUNTER — Ambulatory Visit (HOSPITAL_COMMUNITY)
Admission: RE | Admit: 2018-02-23 | Discharge: 2018-02-23 | Disposition: A | Discharge status: Home / Self Care | Payer: PPO | Source: Ambulatory Visit | Attending: Urology | Admitting: Urology

## 2018-02-23 DIAGNOSIS — R972 Elevated prostate specific antigen [PSA]: Secondary | ICD-10-CM

## 2018-02-23 MED ORDER — GADOBENATE DIMEGLUMINE 529 MG/ML IV SOLN
20.0000 mL | Freq: Once | INTRAVENOUS | Status: AC | PRN
  Administered 2018-02-23: 20 mL via INTRAVENOUS

## 2018-02-23 MED ORDER — LIDOCAINE HCL 2 % EX GEL: 1.0000 "application " | Freq: Once | CUTANEOUS | Status: DC

## 2018-02-23 MED ORDER — LIDOCAINE HCL 2 % EX GEL
CUTANEOUS | Status: AC
  Filled 2018-02-23: qty 30

## 2018-02-24 DIAGNOSIS — Z981 Arthrodesis status: Secondary | ICD-10-CM | POA: Diagnosis not present

## 2018-02-28 ENCOUNTER — Other Ambulatory Visit: Payer: Self-pay | Admitting: Internal Medicine

## 2018-02-28 DIAGNOSIS — M150 Primary generalized (osteo)arthritis: Principal | ICD-10-CM

## 2018-02-28 DIAGNOSIS — M159 Polyosteoarthritis, unspecified: Secondary | ICD-10-CM

## 2018-03-17 DIAGNOSIS — G5603 Carpal tunnel syndrome, bilateral upper limbs: Secondary | ICD-10-CM | POA: Diagnosis not present

## 2018-03-17 DIAGNOSIS — G5621 Lesion of ulnar nerve, right upper limb: Secondary | ICD-10-CM | POA: Diagnosis not present

## 2018-03-25 DIAGNOSIS — Z981 Arthrodesis status: Secondary | ICD-10-CM | POA: Diagnosis not present

## 2018-03-25 DIAGNOSIS — G5603 Carpal tunnel syndrome, bilateral upper limbs: Secondary | ICD-10-CM | POA: Diagnosis not present

## 2018-03-25 DIAGNOSIS — M720 Palmar fascial fibromatosis [Dupuytren]: Secondary | ICD-10-CM | POA: Diagnosis not present

## 2018-04-27 DIAGNOSIS — R972 Elevated prostate specific antigen [PSA]: Secondary | ICD-10-CM | POA: Diagnosis not present

## 2018-05-02 DIAGNOSIS — G5602 Carpal tunnel syndrome, left upper limb: Secondary | ICD-10-CM | POA: Diagnosis not present

## 2018-05-02 DIAGNOSIS — M720 Palmar fascial fibromatosis [Dupuytren]: Secondary | ICD-10-CM | POA: Diagnosis not present

## 2018-05-02 DIAGNOSIS — G5601 Carpal tunnel syndrome, right upper limb: Secondary | ICD-10-CM | POA: Diagnosis not present

## 2018-05-02 DIAGNOSIS — G5603 Carpal tunnel syndrome, bilateral upper limbs: Secondary | ICD-10-CM | POA: Diagnosis not present

## 2018-05-02 DIAGNOSIS — G5621 Lesion of ulnar nerve, right upper limb: Secondary | ICD-10-CM | POA: Diagnosis not present

## 2018-05-15 DIAGNOSIS — G5602 Carpal tunnel syndrome, left upper limb: Secondary | ICD-10-CM | POA: Diagnosis not present

## 2018-05-23 HISTORY — PX: CARPAL TUNNEL RELEASE: SHX101

## 2018-05-24 ENCOUNTER — Other Ambulatory Visit: Payer: Self-pay | Admitting: Internal Medicine

## 2018-05-24 DIAGNOSIS — K2100 Gastro-esophageal reflux disease with esophagitis, without bleeding: Secondary | ICD-10-CM

## 2018-05-25 ENCOUNTER — Other Ambulatory Visit: Payer: Self-pay | Admitting: Internal Medicine

## 2018-05-25 DIAGNOSIS — K2100 Gastro-esophageal reflux disease with esophagitis, without bleeding: Secondary | ICD-10-CM

## 2018-05-25 DIAGNOSIS — M159 Polyosteoarthritis, unspecified: Secondary | ICD-10-CM

## 2018-05-25 DIAGNOSIS — M150 Primary generalized (osteo)arthritis: Secondary | ICD-10-CM

## 2018-05-26 ENCOUNTER — Telehealth: Payer: Self-pay | Admitting: Internal Medicine

## 2018-05-26 NOTE — Telephone Encounter (Signed)
Copied from [place] [id number]. Topic: Quick Communication - Rx Refill/Question >> May 26, 2018  2:17 PM Jonathan Young wrote:  Pharmacy does not have medications  Medication: pantoprazole (PROTONIX) 40 MG tablet [[id number]] , rosuvastatin (CRESTOR) 10 MG tablet [[id number]]   Has the patient contacted their pharmacy? Yes.   (Agent: If no, request that the patient contact the pharmacy for the refill.) (Agent: If yes, when and what did the pharmacy advise?)  Preferred Pharmacy (with phone number or street name): [facility]  Agent: Please be advised that RX refills may take up to 3 business days. We ask that you follow-up with your pharmacy.

## 2018-05-27 NOTE — Telephone Encounter (Signed)
Pharmacy sent a request and it was filled yesterday. Pt is due for an appointment in the next few months.

## 2018-05-27 NOTE — Telephone Encounter (Signed)
LVM for patient to telling him meds have been called in and to call back to make a CPE appointment. He is due anytime. Please make appt.

## 2018-05-28 DIAGNOSIS — M79642 Pain in left hand: Secondary | ICD-10-CM | POA: Diagnosis not present

## 2018-06-03 ENCOUNTER — Other Ambulatory Visit: Payer: Self-pay | Admitting: Internal Medicine

## 2018-06-03 DIAGNOSIS — K2100 Gastro-esophageal reflux disease with esophagitis, without bleeding: Secondary | ICD-10-CM

## 2018-06-03 DIAGNOSIS — E785 Hyperlipidemia, unspecified: Secondary | ICD-10-CM

## 2018-06-03 MED ORDER — PANTOPRAZOLE SODIUM 40 MG PO TBEC: 40.0000 mg | DELAYED_RELEASE_TABLET | Freq: Every day | ORAL | 0 refills | Status: DC

## 2018-06-03 MED ORDER — ROSUVASTATIN CALCIUM 10 MG PO TABS: 10.0000 mg | ORAL_TABLET | Freq: Every day | ORAL | 0 refills | Status: DC

## 2018-06-18 ENCOUNTER — Other Ambulatory Visit: Payer: Self-pay | Admitting: Internal Medicine

## 2018-07-10 ENCOUNTER — Ambulatory Visit: Payer: PPO

## 2018-07-14 ENCOUNTER — Encounter: Payer: Self-pay | Admitting: Internal Medicine

## 2018-07-14 ENCOUNTER — Other Ambulatory Visit (INDEPENDENT_AMBULATORY_CARE_PROVIDER_SITE_OTHER): Payer: PPO

## 2018-07-14 ENCOUNTER — Ambulatory Visit (INDEPENDENT_AMBULATORY_CARE_PROVIDER_SITE_OTHER): Payer: PPO | Admitting: Internal Medicine

## 2018-07-14 VITALS — BP 118/80 | HR 65 | Temp 97.9°F | Resp 16 | Ht 73.0 in | Wt 199.0 lb

## 2018-07-14 DIAGNOSIS — E538 Deficiency of other specified B group vitamins: Secondary | ICD-10-CM

## 2018-07-14 DIAGNOSIS — K219 Gastro-esophageal reflux disease without esophagitis: Secondary | ICD-10-CM | POA: Diagnosis not present

## 2018-07-14 DIAGNOSIS — Z7252 High risk homosexual behavior: Secondary | ICD-10-CM

## 2018-07-14 DIAGNOSIS — N183 Chronic kidney disease, stage 3 unspecified: Secondary | ICD-10-CM

## 2018-07-14 DIAGNOSIS — E559 Vitamin D deficiency, unspecified: Secondary | ICD-10-CM | POA: Diagnosis not present

## 2018-07-14 DIAGNOSIS — I10 Essential (primary) hypertension: Secondary | ICD-10-CM

## 2018-07-14 DIAGNOSIS — E785 Hyperlipidemia, unspecified: Secondary | ICD-10-CM

## 2018-07-14 DIAGNOSIS — R972 Elevated prostate specific antigen [PSA]: Secondary | ICD-10-CM

## 2018-07-14 DIAGNOSIS — Z0000 Encounter for general adult medical examination without abnormal findings: Secondary | ICD-10-CM

## 2018-07-14 LAB — VITAMIN D 25 HYDROXY (VIT D DEFICIENCY, FRACTURES): VITD: 49.49 ng/mL (ref 30.00–100.00)

## 2018-07-14 LAB — COMPREHENSIVE METABOLIC PANEL WITH GFR
ALT: 28 U/L (ref 0–53)
AST: 27 U/L (ref 0–37)
Albumin: 4.4 g/dL (ref 3.5–5.2)
Alkaline Phosphatase: 50 U/L (ref 39–117)
BUN: 17 mg/dL (ref 6–23)
CO2: 26 meq/L (ref 19–32)
Calcium: 9.7 mg/dL (ref 8.4–10.5)
Chloride: 104 meq/L (ref 96–112)
Creatinine, Ser: 1.45 mg/dL (ref 0.40–1.50)
GFR: 51.51 mL/min — ABNORMAL LOW
Glucose, Bld: 100 mg/dL — ABNORMAL HIGH (ref 70–99)
Potassium: 5 meq/L (ref 3.5–5.1)
Sodium: 139 meq/L (ref 135–145)
Total Bilirubin: 0.5 mg/dL (ref 0.2–1.2)
Total Protein: 7.2 g/dL (ref 6.0–8.3)

## 2018-07-14 LAB — VITAMIN B12: Vitamin B-12: 735 pg/mL (ref 211–911)

## 2018-07-14 LAB — URINALYSIS, ROUTINE W REFLEX MICROSCOPIC
Bilirubin Urine: NEGATIVE
Hgb urine dipstick: NEGATIVE
Ketones, ur: NEGATIVE
Leukocytes, UA: NEGATIVE
Nitrite: NEGATIVE
RBC / HPF: NONE SEEN
Specific Gravity, Urine: 1.02 (ref 1.000–1.030)
Urine Glucose: NEGATIVE
Urobilinogen, UA: 0.2 (ref 0.0–1.0)
pH: 6 (ref 5.0–8.0)

## 2018-07-14 LAB — CBC WITH DIFFERENTIAL/PLATELET
BASOS PCT: 0.7 % (ref 0.0–3.0)
Basophils Absolute: 0 10*3/uL (ref 0.0–0.1)
EOS ABS: 0.2 10*3/uL (ref 0.0–0.7)
EOS PCT: 2.9 % (ref 0.0–5.0)
HEMATOCRIT: 44 % (ref 39.0–52.0)
HEMOGLOBIN: 14.8 g/dL (ref 13.0–17.0)
LYMPHS ABS: 1.7 10*3/uL (ref 0.7–4.0)
Lymphocytes Relative: 30.2 % (ref 12.0–46.0)
MCHC: 33.8 g/dL (ref 30.0–36.0)
MCV: 93.7 fl (ref 78.0–100.0)
MONO ABS: 0.5 10*3/uL (ref 0.1–1.0)
Monocytes Relative: 8.4 % (ref 3.0–12.0)
Neutro Abs: 3.2 10*3/uL (ref 1.4–7.7)
Neutrophils Relative %: 57.8 % (ref 43.0–77.0)
Platelets: 231 10*3/uL (ref 150.0–400.0)
RBC: 4.69 Mil/uL (ref 4.22–5.81)
RDW: 13 % (ref 11.5–15.5)
WBC: 5.6 10*3/uL (ref 4.0–10.5)

## 2018-07-14 LAB — LIPID PANEL
Cholesterol: 188 mg/dL (ref 0–200)
HDL: 49.1 mg/dL
LDL Cholesterol: 114 mg/dL — ABNORMAL HIGH (ref 0–99)
NonHDL: 138.86
Total CHOL/HDL Ratio: 4
Triglycerides: 124 mg/dL (ref 0.0–149.0)
VLDL: 24.8 mg/dL (ref 0.0–40.0)

## 2018-07-14 LAB — TSH: TSH: 1.53 u[IU]/mL (ref 0.35–4.50)

## 2018-07-14 LAB — FOLATE: Folate: 23.4 ng/mL (ref >5.9)

## 2018-07-14 MED ORDER — RANITIDINE HCL 300 MG PO TABS: 300.0000 mg | ORAL_TABLET | Freq: Every day | ORAL | 1 refills | Status: DC

## 2018-07-14 NOTE — Patient Instructions (Signed)

## 2018-07-14 NOTE — Progress Notes (Signed)
Subjective:  Patient ID: Jonathan Young, male    DOB: 10-23-1951  Age: 67 y.o. MRN: [id number]  CC: Annual Exam; Hyperlipidemia; and Gastroesophageal Reflux   HPI Czar P Colmenares presents for a CPX.  He wants to stop taking the PPI because he is concerned about the adverse renal effects. His heartburn is well controlled. He is tolerating the statin well with no muscle or joint aches.   Past Medical History:  Diagnosis Date  . Dysrhythmia    PVC'S  . GERD (gastroesophageal reflux disease)   . Hypercholesteremia   . Osteoarthritis    Past Surgical History:  Procedure Laterality Date  . [place]  . SHOULDER SURGERY     BIL  . TONSILLECTOMY AND ADENOIDECTOMY    . TOTAL HIP ARTHROPLASTY     RT TOAL HIP  . TOTAL HIP ARTHROPLASTY  06/01/2012   Procedure: TOTAL HIP ARTHROPLASTY ANTERIOR APPROACH;  Surgeon: Mauri Pole, MD;  Location: [facility];  Service: Orthopedics;  Laterality: Left;    reports that he quit smoking about 37 years ago. He has never used smokeless tobacco. He reports that he drinks about 8.4 oz of alcohol per week. He reports that he does not use drugs. family history includes Emphysema in his mother. Allergies  Allergen Reactions  . Ezetimibe-Simvastatin Other (See Comments)    "Thought I was loosing my mind"    Outpatient Medications Prior to Visit  Medication Sig Dispense Refill  . Cyanocobalamin (B-12 DOTS) 500 MCG TBDP Take 500 mcg by mouth daily.      . meloxicam (MOBIC) 15 MG tablet TAKE 1 TABLET BY MOUTH EVERY DAY 90 tablet 0  . rosuvastatin (CRESTOR) 10 MG tablet Take 1 tablet (10 mg total) by mouth daily. 90 tablet 0  . tadalafil (CIALIS) 20 MG tablet TAKE 1 TABLET BY MOUTH ONE HOUR PRIOR TO INTERCOURSE; MAXIMUM 1 TABLET IN 24 HOURS ! 10 tablet 5  . valACYclovir (VALTREX) 1000 MG tablet Take 500 mg by mouth daily.     . Vitamin D, Ergocalciferol, (DRISDOL) 50000 units CAPS capsule TAKE ONE CAPSULE BY MOUTH EVERY WEEK 12 capsule 0  .  pantoprazole (PROTONIX) 40 MG tablet Take 1 tablet (40 mg total) by mouth daily. 90 tablet 0  . aspirin 81 MG tablet Take 81 mg by mouth daily.    . fish oil-omega-3 fatty acids 1000 MG capsule Take 1 g by mouth daily.     No facility-administered medications prior to visit.     ROS Review of Systems  Constitutional: Negative.  Negative for diaphoresis, fatigue and unexpected weight change.  HENT: Negative.  Negative for sinus pressure, sore throat, trouble swallowing and voice change.   Eyes: Negative for visual disturbance.  Respiratory: Negative.  Negative for cough, chest tightness and shortness of breath.        No DOE  Cardiovascular: Negative for chest pain, palpitations and leg swelling.  Gastrointestinal: Negative for abdominal pain, constipation, diarrhea, nausea and vomiting.  Endocrine: Negative.   Genitourinary: Negative.   Musculoskeletal: Negative.  Negative for arthralgias, back pain, myalgias and neck pain.  Skin: Negative.  Negative for color change.  Allergic/Immunologic: Negative.   Neurological: Negative.  Negative for dizziness.  Hematological: Negative for adenopathy. Does not bruise/bleed easily.  Psychiatric/Behavioral: Negative.     Objective:  BP 118/80 (BP Location: Left Arm, Patient Position: Sitting, Cuff Size: Large)   Pulse 65   Temp 97.9 F (36.6 C) (Oral)   Resp 16  Ht 6\' 1"  (1.854 m)   Wt 199 lb (90.3 kg)   SpO2 97%   BMI 26.25 kg/m   BP Readings from Last 3 Encounters:  07/14/18 118/80  08/21/17 134/90  08/20/17 (!) 136/110    Wt Readings from Last 3 Encounters:  07/14/18 199 lb (90.3 kg)  08/21/17 205 lb (93 kg)  07/08/17 202 lb 8 oz (91.9 kg)    Physical Exam  Constitutional: He is oriented to person, place, and time. No distress.  HENT:  Mouth/Throat: Oropharynx is clear and moist. No oropharyngeal exudate.  Eyes: Conjunctivae are normal. No scleral icterus.  Neck: Normal range of motion. Neck supple. No JVD present. No  thyromegaly present.  Cardiovascular: Normal rate, regular rhythm and normal heart sounds. Exam reveals no friction rub.  No murmur heard. Pulmonary/Chest: Effort normal and breath sounds normal. No respiratory distress. He has no wheezes. He has no rhonchi. He has no rales.  Abdominal: Soft. Normal appearance and bowel sounds are normal. He exhibits no mass. There is no hepatosplenomegaly. There is no tenderness.  Genitourinary:  Genitourinary Comments: GU/rectal exams were deferred at his request since he tells me his urologist will do this in 2 weeks.  Musculoskeletal: Normal range of motion. He exhibits no edema, tenderness or deformity.  Lymphadenopathy:    He has no cervical adenopathy.  Neurological: He is alert and oriented to person, place, and time.  Skin: Skin is warm and dry. No rash noted. He is not diaphoretic.  Psychiatric: He has a normal mood and affect. His behavior is normal. Judgment and thought content normal.  Vitals reviewed.   Lab Results  Component Value Date   WBC 5.6 07/14/2018   HGB 14.8 07/14/2018   HCT 44.0 07/14/2018   PLT 231.0 07/14/2018   GLUCOSE 100 (H) 07/14/2018   CHOL 188 07/14/2018   TRIG 124.0 07/14/2018   HDL 49.10 07/14/2018   LDLDIRECT 87.0 04/03/2016   LDLCALC 114 (H) 07/14/2018   ALT 28 07/14/2018   AST 27 07/14/2018   NA 139 07/14/2018   K 5.0 07/14/2018   CL 104 07/14/2018   CREATININE 1.45 07/14/2018   BUN 17 07/14/2018   CO2 26 07/14/2018   TSH 1.53 07/14/2018   PSA 7.7 05/20/2017   INR 0.94 05/27/2012   HGBA1C 5.8 04/03/2016    Mr Prostate W Wo Contrast  Result Date: 02/23/2018 CLINICAL DATA:  Elevated PSA.  Prior negative biopsy. EXAM: MR PROSTATE WITHOUT AND WITH CONTRAST TECHNIQUE: Multiplanar multisequence MRI images were obtained of the pelvis centered about the prostate. Pre and post contrast images were obtained. CONTRAST:  20 cc of MultiHance COMPARISON:  None. FINDINGS: Degraded exam secondary to susceptibility  artifact from bilateral hip arthroplasties. This especially degrades (makes essentially nondiagnostic) the diffusion-weighted images. Prostate: No areas of masslike T2 hypointensity within the peripheral zone of the prostate. Moderate central gland enlargement and heterogeneity, consistent with benign prostatic hyperplasia. No suspicious dominant central gland nodule. Vague early post-contrast enhancement within the mid to apical gland is non masslike, nonspecific but possibly related to prostatitis. Example image [id number]. Volume: 5.4 x 4.1 x 4.1 cm (volume = 48 cm^3) Transcapsular spread:  Absent Seminal vesicle involvement: Absent Neurovascular bundle involvement: Absent Pelvic adenopathy: Absent Bone metastasis: Absent Other findings: No significant free fluid. Grossly normal urinary bladder. Tiny fat containing left inguinal hernia. IMPRESSION: 1. Moderate degradation secondary to susceptibility artifact from bilateral hip arthroplasties. This especially degrades the diffusion-weighted images. 2. Given this limitation, no evidence of high-grade  or macroscopic prostate carcinoma identified. Electronically Signed   By: Abigail Miyamoto M.D.   On: 02/23/2018 14:41    Assessment & Plan:   Ruperto was seen today for annual exam, hyperlipidemia and gastroesophageal reflux.  Diagnoses and all orders for this visit:  CRI (chronic renal insufficiency), stage 3 (moderate) ([place])- his renal fxn is stable, he was encouraged to avoid nephrotoxic agents -     CBC with Differential/Platelet; Future -     Comprehensive metabolic panel; Future -     Urinalysis, Routine w reflex microscopic; Future  PSA elevation  Hyperlipidemia with target LDL less than 130- he has achieved his LDL goal and is doing well on the statin -     Lipid panel; Future -     Comprehensive metabolic panel; Future -     TSH; Future  Vitamin B 12 deficiency - B12 level is normal, cont the same supplement -     CBC with Differential/Platelet;  Future -     Folate; Future -     Vitamin B12; Future  Vitamin D deficiency- Vit D level is normal, cont the same supplement -     VITAMIN D 25 Hydroxy (Vit-D Deficiency, Fractures); Future  Gastroesophageal reflux disease without esophagitis- will change to an H2 blocker -     ranitidine (ZANTAC) 300 MG tablet; Take 1 tablet (300 mg total) by mouth at bedtime.  Essential hypertension- His BP is well controlled  High risk homosexual behavior -     HIV antibody; Future  Routine general medical examination at a health care facility   I have discontinued Bartley P. Tippins's pantoprazole. I am also having him start on ranitidine. Additionally, I am having him maintain his valACYclovir, Cyanocobalamin, fish oil-omega-3 fatty acids, aspirin, tadalafil, meloxicam, rosuvastatin, and Vitamin D (Ergocalciferol).  Meds ordered this encounter  Medications  . ranitidine (ZANTAC) 300 MG tablet    Sig: Take 1 tablet (300 mg total) by mouth at bedtime.    Dispense:  90 tablet    Refill:  1   See AVS for instructions about healthy living and anticipatory guidance.  Follow-up: Return in about 6 months (around 01/14/2019).  Scarlette Calico, MD

## 2018-07-15 ENCOUNTER — Encounter: Payer: Self-pay | Admitting: Internal Medicine

## 2018-07-15 ENCOUNTER — Ambulatory Visit: Payer: PPO

## 2018-07-15 LAB — HIV ANTIBODY (ROUTINE TESTING W REFLEX): HIV 1&2 Ab, 4th Generation: NONREACTIVE

## 2018-07-15 NOTE — Assessment & Plan Note (Signed)

## 2018-07-15 NOTE — Progress Notes (Deleted)
Subjective:   Jonathan Young is a 67 y.o. male who presents for Medicare Annual/Subsequent preventive examination.  Review of Systems:  No ROS.  Medicare Wellness Visit. Additional risk factors are reflected in the social history.    Sleep patterns: {SX; SLEEP PATTERNS:18802::"feels rested on waking","does not get up to void","gets up *** times nightly to void","sleeps *** hours nightly"}.    Home Safety/Smoke Alarms: Feels safe in home. Smoke alarms in place.  Living environment; residence and Firearm Safety: {Rehab home environment / accessibility:30080::"no firearms","firearms stored safely"}. Seat Belt Safety/Bike Helmet: Wears seat belt.   PSA-  Lab Results  Component Value Date   PSA 7.7 05/20/2017   PSA 4.94 10/24/2016   PSA 4.96 (H) 12/29/2014       Objective:    Vitals: There were no vitals taken for this visit.  There is no height or weight on file to calculate BMI.  Advanced Directives 08/20/2017 08/20/2017 07/08/2017 04/04/2016 03/17/2016 02/09/2016 01/29/2016  Does Patient Have a Medical Advance Directive? No No Yes Yes No Yes Yes  Type of Advance Directive - Public librarian;Living will - - Special educational needs teacher of [place];Living will  Does patient want to make changes to medical advance directive? - - - No - Patient declined - - No - Patient declined  Copy of [place] in Chart? - - No - copy requested Yes - - No - copy requested  Would patient like information on creating a medical advance directive? - - - - No - patient declined information - -  Pre-existing out of facility DNR order (yellow form or pink MOST form) - - - - - - -    Tobacco Social History   Tobacco Use  Smoking Status Former Smoker  . Last attempt to quit: 05/27/1981  . Years since quitting: 37.1  Smokeless Tobacco Never Used     Counseling given: Not Answered  Past Medical History:  Diagnosis Date  . Dysrhythmia    PVC'S  . GERD  (gastroesophageal reflux disease)   . Hypercholesteremia   . Osteoarthritis    Past Surgical History:  Procedure Laterality Date  . [place]  . SHOULDER SURGERY     BIL  . TONSILLECTOMY AND ADENOIDECTOMY    . TOTAL HIP ARTHROPLASTY     RT TOAL HIP  . TOTAL HIP ARTHROPLASTY  06/01/2012   Procedure: TOTAL HIP ARTHROPLASTY ANTERIOR APPROACH;  Surgeon: Mauri Pole, MD;  Location: [facility];  Service: Orthopedics;  Laterality: Left;   Family History  Problem Relation Age of Onset  . Emphysema Mother   . Colon cancer Neg Hx   . Esophageal cancer Neg Hx   . Rectal cancer Neg Hx   . Stomach cancer Neg Hx    Social History   Socioeconomic History  . Marital status: Single    Spouse name: Not on file  . Number of children: Not on file  . Years of education: Not on file  . Highest education level: Not on file  Occupational History  . Not on file  Social Needs  . Financial resource strain: Not on file  . Food insecurity:    Worry: Not on file    Inability: Not on file  . Transportation needs:    Medical: Not on file    Non-medical: Not on file  Tobacco Use  . Smoking status: Former Smoker    Last attempt to quit: 05/27/1981  Years since quitting: 37.1  . Smokeless tobacco: Never Used  Substance and Sexual Activity  . Alcohol use: Yes    Alcohol/week: 8.4 oz    Types: 14 Shots of liquor per week  . Drug use: No  . Sexual activity: Not on file  Lifestyle  . Physical activity:    Days per week: Not on file    Minutes per session: Not on file  . Stress: Not on file  Relationships  . Social connections:    Talks on phone: Not on file    Gets together: Not on file    Attends religious service: Not on file    Active member of club or organization: Not on file    Attends meetings of clubs or organizations: Not on file    Relationship status: Not on file  Other Topics Concern  . Not on file  Social History Narrative  . Not on file    Outpatient  Encounter Medications as of 07/15/2018  Medication Sig  . aspirin 81 MG tablet Take 81 mg by mouth daily.  . Cyanocobalamin (B-12 DOTS) 500 MCG TBDP Take 500 mcg by mouth daily.    . fish oil-omega-3 fatty acids 1000 MG capsule Take 1 g by mouth daily.  . meloxicam (MOBIC) 15 MG tablet TAKE 1 TABLET BY MOUTH EVERY DAY  . ranitidine (ZANTAC) 300 MG tablet Take 1 tablet (300 mg total) by mouth at bedtime.  . rosuvastatin (CRESTOR) 10 MG tablet Take 1 tablet (10 mg total) by mouth daily.  . tadalafil (CIALIS) 20 MG tablet TAKE 1 TABLET BY MOUTH ONE HOUR PRIOR TO INTERCOURSE; MAXIMUM 1 TABLET IN 24 HOURS !  . valACYclovir (VALTREX) 1000 MG tablet Take 500 mg by mouth daily.   . Vitamin D, Ergocalciferol, (DRISDOL) 50000 units CAPS capsule TAKE ONE CAPSULE BY MOUTH EVERY WEEK   No facility-administered encounter medications on file as of 07/15/2018.     Activities of Daily Living No flowsheet data found.  Patient Care Team: Janith Lima, MD as PCP - General (Internal Medicine)   Assessment:   This is a routine wellness examination for Jonathan Young. Physical assessment deferred to PCP.   Exercise Activities and Dietary recommendations   Diet (meal preparation, eat out, water intake, caffeinated beverages, dairy products, fruits and vegetables): {Desc; diets:16563}   Goals    None      Fall Risk Fall Risk  07/14/2018 07/08/2017 04/04/2016  Falls in the past year? No No Yes  Number falls in past yr: - - 1  Injury with Fall? - - Yes  Risk for fall due to : - - History of fall(s)  Follow up - - Education provided   Depression Screen PHQ 2/9 Scores 07/14/2018 07/08/2017 04/04/2016  PHQ - 2 Score 0 2 0  PHQ- 9 Score - 6 -    Cognitive Function        Immunization History  Administered Date(s) Administered  . Influenza Split 09/17/2012, 08/24/2013  . Influenza Whole 10/23/2000, 09/28/2009, 09/20/2010  . Influenza,inj,Quad PF,6+ Mos 08/24/2014, 08/18/2015  . Influenza-Unspecified  10/01/2016, 11/11/2017  . Pneumococcal Conjugate-13 10/01/2016  . Pneumococcal Polysaccharide-23 11/11/2017  . Td 12/23/2005  . Tdap 03/17/2016  . Zoster 01/16/2011  . Zoster Recombinat (Shingrix) 07/16/2017, 11/22/2017   Screening Tests Health Maintenance  Topic Date Due  . INFLUENZA VACCINE  07/23/2018  . COLONOSCOPY  02/08/2021  . TETANUS/TDAP  03/17/2026  . Hepatitis C Screening  Completed  . PNA vac Low Risk Adult  Completed      Plan:     I have personally reviewed and noted the following in the patient's chart:   . Medical and social history . Use of alcohol, tobacco or illicit drugs  . Current medications and supplements . Functional ability and status . Nutritional status . Physical activity . Advanced directives . List of other physicians . Vitals . Screenings to include cognitive, depression, and falls . Referrals and appointments  In addition, I have reviewed and discussed with patient certain preventive protocols, quality metrics, and best practice recommendations. A written personalized care plan for preventive services as well as general preventive health recommendations were provided to patient.     Michiel Cowboy, RN  07/15/2018

## 2018-07-23 DIAGNOSIS — H524 Presbyopia: Secondary | ICD-10-CM | POA: Diagnosis not present

## 2018-07-24 DIAGNOSIS — Z981 Arthrodesis status: Secondary | ICD-10-CM | POA: Diagnosis not present

## 2018-07-24 DIAGNOSIS — Z6826 Body mass index (BMI) 26.0-26.9, adult: Secondary | ICD-10-CM | POA: Diagnosis not present

## 2018-07-30 DIAGNOSIS — R972 Elevated prostate specific antigen [PSA]: Secondary | ICD-10-CM | POA: Diagnosis not present

## 2018-07-30 LAB — PSA: PSA: 8

## 2018-08-05 DIAGNOSIS — R972 Elevated prostate specific antigen [PSA]: Secondary | ICD-10-CM | POA: Diagnosis not present

## 2018-08-22 ENCOUNTER — Other Ambulatory Visit: Payer: Self-pay | Admitting: Internal Medicine

## 2018-08-22 DIAGNOSIS — M150 Primary generalized (osteo)arthritis: Principal | ICD-10-CM

## 2018-08-22 DIAGNOSIS — M159 Polyosteoarthritis, unspecified: Secondary | ICD-10-CM

## 2018-08-25 NOTE — Progress Notes (Addendum)
Subjective:   Jonathan Young is a 67 y.o. male who presents for Medicare Annual/Subsequent preventive examination.  Review of Systems:  No ROS.  Medicare Wellness Visit. Additional risk factors are reflected in the social history.  Cardiac Risk Factors include: advanced age (>46men, >97 women);dyslipidemia;hypertension;male gender Sleep patterns: feels rested on waking, gets up 1 times nightly to void and sleeps 6-7 hours nightly.    Home Safety/Smoke Alarms: Feels safe in home. Smoke alarms in place.  Living environment; residence and Firearm Safety: 1-story house/ trailer, no firearms. Lives with roomate, no needs for DME, good support system. Seat Belt Safety/Bike Helmet: Wears seat belt.   PSA-  Lab Results  Component Value Date   PSA 8.00 07/30/2018   PSA 7.7 05/20/2017   PSA 4.94 10/24/2016      Objective:    Vitals: BP 132/74   Pulse 76   Resp 18   Ht 6\' 1"  (1.854 m)   Wt 200 lb (90.7 kg)   SpO2 98%   BMI 26.39 kg/m   Body mass index is 26.39 kg/m.  Advanced Directives 08/26/2018 08/20/2017 08/20/2017 07/08/2017 04/04/2016 03/17/2016 02/09/2016  Does Patient Have a Medical Advance Directive? Yes No No Yes Yes No Yes  Type of Paramedic of [place];Living will - - [place];Living will - - [place]  Does patient want to make changes to medical advance directive? - - - - No - Patient declined - -  Copy of [place] in Chart? No - copy requested - - No - copy requested Yes - -  Would patient like information on creating a medical advance directive? - - - - - No - patient declined information -  Pre-existing out of facility DNR order (yellow form or pink MOST form) - - - - - - -    Tobacco Social History   Tobacco Use  Smoking Status Former Smoker  . Last attempt to quit: 05/27/1981  . Years since quitting: 37.2  Smokeless Tobacco Never Used     Counseling given: Not Answered  Past  Medical History:  Diagnosis Date  . Dysrhythmia    PVC'S  . GERD (gastroesophageal reflux disease)   . Hypercholesteremia   . Osteoarthritis    Past Surgical History:  Procedure Laterality Date  . [place]  . SHOULDER SURGERY     BIL  . TONSILLECTOMY AND ADENOIDECTOMY    . TOTAL HIP ARTHROPLASTY     RT TOAL HIP  . TOTAL HIP ARTHROPLASTY  06/01/2012   Procedure: TOTAL HIP ARTHROPLASTY ANTERIOR APPROACH;  Surgeon: Mauri Pole, MD;  Location: [facility];  Service: Orthopedics;  Laterality: Left;   Family History  Problem Relation Age of Onset  . Emphysema Mother   . Colon cancer Neg Hx   . Esophageal cancer Neg Hx   . Rectal cancer Neg Hx   . Stomach cancer Neg Hx    Social History   Socioeconomic History  . Marital status: Single    Spouse name: Not on file  . Number of children: Not on file  . Years of education: Not on file  . Highest education level: Not on file  Occupational History  . Not on file  Social Needs  . Financial resource strain: Not hard at all  . Food insecurity:    Worry: Never true    Inability: Never true  . Transportation needs:    Medical: No  Non-medical: No  Tobacco Use  . Smoking status: Former Smoker    Last attempt to quit: 05/27/1981    Years since quitting: 37.2  . Smokeless tobacco: Never Used  Substance and Sexual Activity  . Alcohol use: Yes    Alcohol/week: 14.0 standard drinks    Types: 14 Shots of liquor per week  . Drug use: No  . Sexual activity: Yes  Lifestyle  . Physical activity:    Days per week: 5 days    Minutes per session: 60 min  . Stress: Only a little  Relationships  . Social connections:    Talks on phone: More than three times a week    Gets together: More than three times a week    Attends religious service: Not on file    Active member of club or organization: Not on file    Attends meetings of clubs or organizations: Not on file    Relationship status: Not on file  Other Topics  Concern  . Not on file  Social History Narrative  . Not on file    Outpatient Encounter Medications as of 08/26/2018  Medication Sig  . Cyanocobalamin (B-12 DOTS) 500 MCG TBDP Take 500 mcg by mouth daily.    . meloxicam (MOBIC) 15 MG tablet TAKE 1 TABLET BY MOUTH EVERY DAY  . pantoprazole (PROTONIX) 20 MG tablet Take 20 mg by mouth daily.  . ranitidine (ZANTAC) 300 MG tablet Take 300 mg by mouth at bedtime.  . rosuvastatin (CRESTOR) 10 MG tablet Take 1 tablet (10 mg total) by mouth daily.  . tadalafil (CIALIS) 20 MG tablet TAKE 1 TABLET BY MOUTH ONE HOUR PRIOR TO INTERCOURSE; MAXIMUM 1 TABLET IN 24 HOURS !  . valACYclovir (VALTREX) 1000 MG tablet Take 0.5 tablets (500 mg total) by mouth daily.  . Vitamin D, Ergocalciferol, (DRISDOL) 50000 units CAPS capsule TAKE ONE CAPSULE BY MOUTH EVERY WEEK  . [DISCONTINUED] rosuvastatin (CRESTOR) 10 MG tablet Take 1 tablet (10 mg total) by mouth daily.  . [DISCONTINUED] aspirin 81 MG tablet Take 81 mg by mouth daily.  . [DISCONTINUED] fish oil-omega-3 fatty acids 1000 MG capsule Take 1 g by mouth daily.  . [DISCONTINUED] ranitidine (ZANTAC) 300 MG tablet Take 1 tablet (300 mg total) by mouth at bedtime. (Patient not taking: Reported on 08/26/2018)  . [DISCONTINUED] rosuvastatin (CRESTOR) 10 MG tablet TAKE 1 TABLET BY MOUTH ONCE DAILY  . [DISCONTINUED] valACYclovir (VALTREX) 1000 MG tablet Take 500 mg by mouth daily.    No facility-administered encounter medications on file as of 08/26/2018.     Activities of Daily Living In your present state of health, do you have any difficulty performing the following activities: 08/26/2018 07/15/2018  Hearing? N N  Vision? N N  Difficulty concentrating or making decisions? N N  Walking or climbing stairs? N N  Dressing or bathing? N N  Doing errands, shopping? N N  Preparing Food and eating ? N -  Using the Toilet? N -  In the past six months, have you accidently leaked urine? N -  Do you have problems with loss  of bowel control? N -  Managing your Medications? N -  Managing your Finances? N -  Housekeeping or managing your Housekeeping? N -  Some recent data might be hidden    Patient Care Team: Janith Lima, MD as PCP - General (Internal Medicine)   Assessment:   This is a routine wellness examination for Danh. Physical assessment deferred to PCP.  Exercise Activities and Dietary recommendations Current Exercise Habits: Structured exercise class, Type of exercise: walking;strength training/weights;calisthenics, Time (Minutes): 60, Frequency (Times/Week): 5, Weekly Exercise (Minutes/Week): 300, Intensity: Moderate, Exercise limited by: None identified  Diet (meal preparation, eat out, water intake, caffeinated beverages, dairy products, fruits and vegetables): in general, a "healthy" diet  , well balanced. eats a variety of fruits and vegetables daily, limits salt, fat/cholesterol, sugar,carbohydrates,caffeine, drinks 6-8 glasses of water daily.  Goals    . Cut back on the amount of alcohol     Watch the amount I consume and cut back       Fall Risk Fall Risk  08/26/2018 07/15/2018 07/14/2018 07/08/2017 04/04/2016  Falls in the past year? No No No No Yes  Number falls in past yr: - - - - 1  Injury with Fall? - - - - Yes  Risk for fall due to : - - - - History of fall(s)  Follow up - - - - Education provided    Depression Screen PHQ 2/9 Scores 08/26/2018 07/15/2018 07/14/2018 07/08/2017  PHQ - 2 Score 0 0 0 2  PHQ- 9 Score - - - 6    Cognitive Function       Ad8 score reviewed for issues:  Issues making decisions: no  Less interest in hobbies / activities: no  Repeats questions, stories (family complaining): no  Trouble using ordinary gadgets (microwave, computer, phone):no  Forgets the month or year: no  Mismanaging finances: no  Remembering appts: no  Daily problems with thinking and/or memory: no Ad8 score is= 0  Immunization History  Administered Date(s)  Administered  . Influenza Split 09/17/2012, 08/24/2013  . Influenza Whole 10/23/2000, 09/28/2009, 09/20/2010  . Influenza, High Dose Seasonal PF 08/26/2018  . Influenza,inj,Quad PF,6+ Mos 08/24/2014, 08/18/2015  . Influenza-Unspecified 10/01/2016, 11/11/2017  . Pneumococcal Conjugate-13 10/01/2016  . Pneumococcal Polysaccharide-23 11/11/2017  . Td 12/23/2005  . Tdap 03/17/2016  . Zoster 01/16/2011  . Zoster Recombinat (Shingrix) 07/16/2017, 11/22/2017    Screening Tests Health Maintenance  Topic Date Due  . COLONOSCOPY  02/08/2021  . TETANUS/TDAP  03/17/2026  . INFLUENZA VACCINE  Completed  . Hepatitis C Screening  Completed  . PNA vac Low Risk Adult  Completed      Plan:     Continue doing brain stimulating activities (puzzles, reading, adult coloring books, staying active) to keep memory sharp.   Continue to eat heart healthy diet (full of fruits, vegetables, whole grains, lean protein, water--limit salt, fat, and sugar intake) and increase physical activity as tolerated.   I have personally reviewed and noted the following in the patient's chart:   . Medical and social history . Use of alcohol, tobacco or illicit drugs  . Current medications and supplements . Functional ability and status . Nutritional status . Physical activity . Advanced directives . List of other physicians . Vitals . Screenings to include cognitive, depression, and falls . Referrals and appointments  In addition, I have reviewed and discussed with patient certain preventive protocols, quality metrics, and best practice recommendations. A written personalized care plan for preventive services as well as general preventive health recommendations were provided to patient.    Lab Results  Component Value Date   WBC 5.6 07/14/2018   HGB 14.8 07/14/2018   HCT 44.0 07/14/2018   PLT 231.0 07/14/2018   GLUCOSE 100 (H) 07/14/2018   CHOL 188 07/14/2018   TRIG 124.0 07/14/2018   HDL 49.10  07/14/2018   LDLDIRECT 87.0 04/03/2016  LDLCALC 114 (H) 07/14/2018   ALT 28 07/14/2018   AST 27 07/14/2018   NA 139 07/14/2018   K 5.0 07/14/2018   CL 104 07/14/2018   CREATININE 1.45 07/14/2018   BUN 17 07/14/2018   CO2 26 07/14/2018   TSH 1.53 07/14/2018   PSA 8.00 07/30/2018   INR 0.94 05/27/2012   HGBA1C 5.8 04/03/2016     Michiel Cowboy, RN  08/26/2018

## 2018-08-26 ENCOUNTER — Ambulatory Visit: Payer: PPO | Admitting: *Deleted

## 2018-08-26 ENCOUNTER — Other Ambulatory Visit: Payer: Self-pay | Admitting: Internal Medicine

## 2018-08-26 VITALS — BP 132/74 | HR 76 | Resp 18 | Ht 73.0 in | Wt 200.0 lb

## 2018-08-26 DIAGNOSIS — Z23 Encounter for immunization: Secondary | ICD-10-CM

## 2018-08-26 DIAGNOSIS — Z0000 Encounter for general adult medical examination without abnormal findings: Secondary | ICD-10-CM

## 2018-08-26 DIAGNOSIS — E785 Hyperlipidemia, unspecified: Secondary | ICD-10-CM

## 2018-08-26 MED ORDER — VALACYCLOVIR HCL 1 G PO TABS: 500.0000 mg | ORAL_TABLET | Freq: Every day | ORAL | 0 refills | Status: AC

## 2018-08-26 MED ORDER — ROSUVASTATIN CALCIUM 10 MG PO TABS: 10.0000 mg | ORAL_TABLET | Freq: Every day | ORAL | 0 refills | Status: DC

## 2018-08-26 NOTE — Patient Instructions (Signed)
Continue doing brain stimulating activities (puzzles, reading, adult coloring books, staying active) to keep memory sharp.   Continue to eat heart healthy diet (full of fruits, vegetables, whole grains, lean protein, water--limit salt, fat, and sugar intake) and increase physical activity as tolerated.  Influenza Virus Vaccine injection What is this medicine? INFLUENZA VIRUS VACCINE (in floo EN zuh VAHY ruhs vak SEEN) helps to reduce the risk of getting influenza also known as the flu. The vaccine only helps protect you against some strains of the flu. This medicine may be used for other purposes; ask your health care provider or pharmacist if you have questions. COMMON BRAND NAME(S): Afluria, Agriflu, Alfuria, FLUAD, Fluarix, Fluarix Quadrivalent, Flublok, Flublok Quadrivalent, FLUCELVAX, Flulaval, Fluvirin, Fluzone, Fluzone High-Dose, Fluzone Intradermal What should I tell my health care provider before I take this medicine? They need to know if you have any of these conditions: -bleeding disorder like hemophilia -fever or infection -Guillain-Barre syndrome or other neurological problems -immune system problems -infection with the human immunodeficiency virus (HIV) or AIDS -low blood platelet counts -multiple sclerosis -an unusual or allergic reaction to influenza virus vaccine, latex, other medicines, foods, dyes, or preservatives. Different brands of vaccines contain different allergens. Some may contain latex or eggs. Talk to your doctor about your allergies to make sure that you get the right vaccine. -pregnant or trying to get pregnant -breast-feeding How should I use this medicine? This vaccine is for injection into a muscle or under the skin. It is given by a health care professional. A copy of Vaccine Information Statements will be given before each vaccination. Read this sheet carefully each time. The sheet may change frequently. Talk to your healthcare provider to see which  vaccines are right for you. Some vaccines should not be used in all age groups. Overdosage: If you think you have taken too much of this medicine contact a poison control center or emergency room at once. NOTE: This medicine is only for you. Do not share this medicine with others. What if I miss a dose? This does not apply. What may interact with this medicine? -chemotherapy or radiation therapy -medicines that lower your immune system like etanercept, anakinra, infliximab, and adalimumab -medicines that treat or prevent blood clots like warfarin -phenytoin -steroid medicines like prednisone or cortisone -theophylline -vaccines This list may not describe all possible interactions. Give your health care provider a list of all the medicines, herbs, non-prescription drugs, or dietary supplements you use. Also tell them if you smoke, drink alcohol, or use illegal drugs. Some items may interact with your medicine. What should I watch for while using this medicine? Report any side effects that do not go away within 3 days to your doctor or health care professional. Call your health care provider if any unusual symptoms occur within 6 weeks of receiving this vaccine. You may still catch the flu, but the illness is not usually as bad. You cannot get the flu from the vaccine. The vaccine will not protect against colds or other illnesses that may cause fever. The vaccine is needed every year. What side effects may I notice from receiving this medicine? Side effects that you should report to your doctor or health care professional as soon as possible: -allergic reactions like skin rash, itching or hives, swelling of the face, lips, or tongue Side effects that usually do not require medical attention (report to your doctor or health care professional if they continue or are bothersome): -fever -headache -muscle aches and pains -pain,  tenderness, redness, or swelling at the injection  site -tiredness This list may not describe all possible side effects. Call your doctor for medical advice about side effects. You may report side effects to FDA at 1-800-FDA-1088. Where should I keep my medicine? The vaccine will be given by a health care professional in a clinic, pharmacy, doctor's office, or other health care setting. You will not be given vaccine doses to store at home. NOTE: This sheet is a summary. It may not cover all possible information. If you have questions about this medicine, talk to your doctor, pharmacist, or health care provider.  2018 Elsevier/Gold Standard (2015-06-30 10:07:28)

## 2018-08-29 IMAGING — MR MR PROSTATE WO/W CM
23 of 55 series · 23 of 55 positions shown · IV contrast (yes)
Comparison: None.

CLINICAL DATA: Elevated PSA.  Prior negative biopsy.

EXAM:
MR PROSTATE WITHOUT AND WITH CONTRAST
TECHNIQUE: Multiplanar multisequence MRI images were obtained of the pelvis
centered about the prostate. Pre and post contrast images were
obtained.
CONTRAST:  20 cc of MultiHance

[Series 3: bSSFP fat-sat · axial · 6.0mm · 0.86mm/px · 1 of 44 slices shown]
[im 1/44]
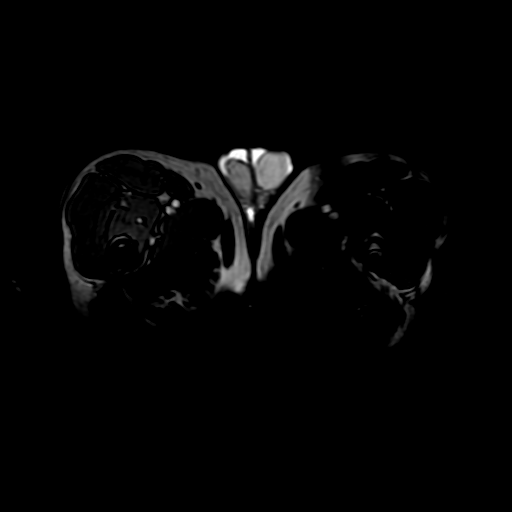

[Series 4: T1 · axial · 6.0mm · 0.86mm/px · 1 of 44 slices shown (1 of 3)]
[im 1/44]
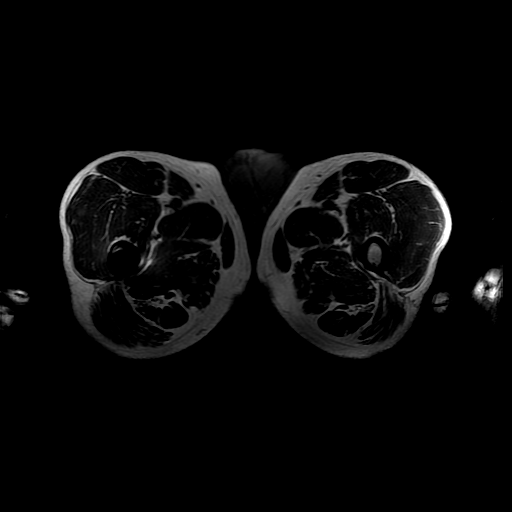

[Series 5: T2 · axial · 3.0mm · 0.29mm/px · 1 of 24 slices shown (1 of 4)]
[im 1/24]
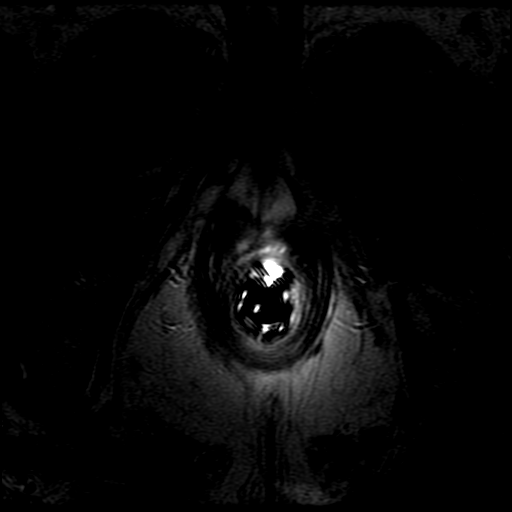

[Series 6: T1 · axial · 3.0mm · 0.29mm/px · 1 of 24 slices shown (2 of 3)]
[im 1/24]
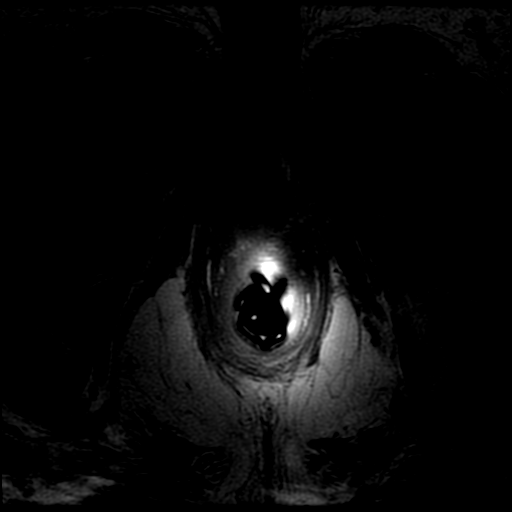

[Series 7: T2 · axial · 1.8mm · 0.47mm/px · 1 of 100 slices shown (2 of 4)]
[im 1/100]
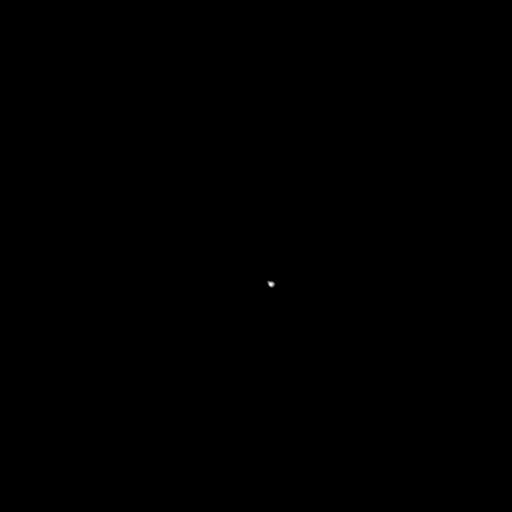

[Series 8: T1 · axial · 3.0mm · 0.29mm/px · 1 of 24 slices shown (3 of 3)]
[im 1/24]
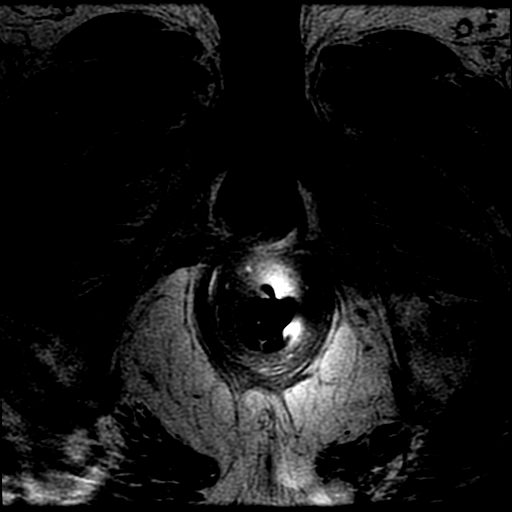

[Series 9: T2 · sagittal · 4.0mm · 0.29mm/px · 1 of 24 slices shown (3 of 4)]
[im 1/24]
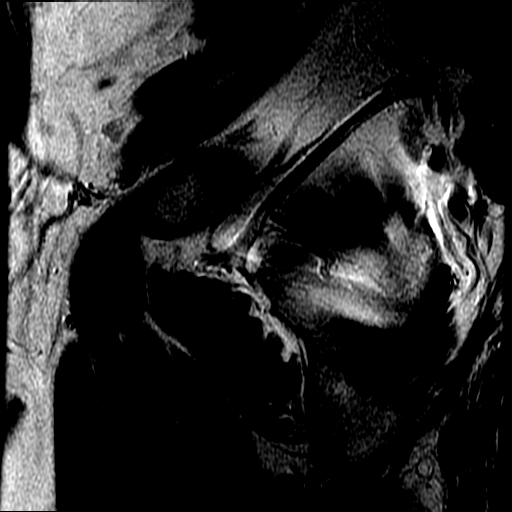

[Series 10: T2 · coronal · 4.0mm · 0.29mm/px · 1 of 24 slices shown (4 of 4)]
[im 1/24]
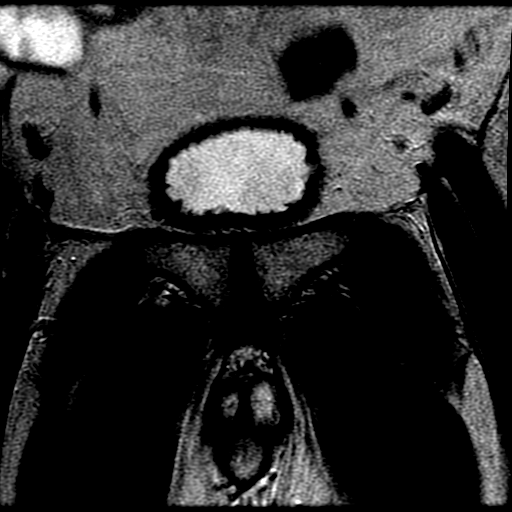

[Series 11: DWI · axial · 3.0mm · 0.59mm/px · 1 of 48 slices shown (1 of 6)]
[im 1/48]
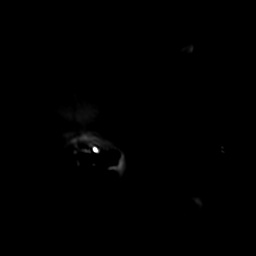

[Series 12: DWI · axial · 3.0mm · 0.59mm/px · 1 of 48 slices shown (2 of 6)]
[im 1/48]
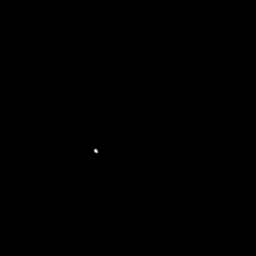

[Series 13: DWI · axial · 3.0mm · 0.59mm/px · 1 of 48 slices shown (3 of 6)]
[im 1/48]
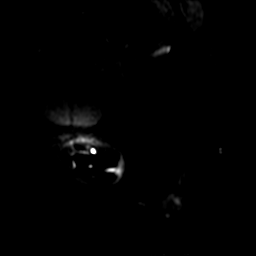

[Series 1100: DWI · axial · 3.0mm · 0.59mm/px · 1 of 24 slices shown (4 of 6)]
[im 1/24]
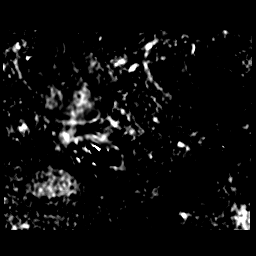

[Series 1200: DWI · axial · 3.0mm · 0.59mm/px · 1 of 24 slices shown (5 of 6)]
[im 1/24]
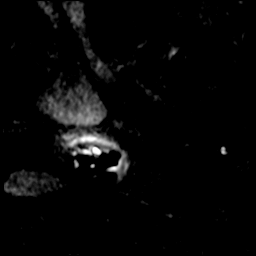

[Series 1300: DWI · axial · 3.0mm · 0.59mm/px · 1 of 24 slices shown (6 of 6)]
[im 1/24]
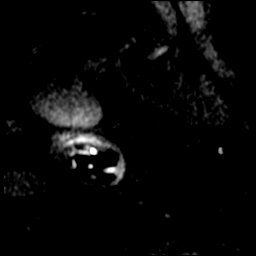

[((id)/(id)/1)-((id)/(id)/1) · axial · 1.8mm · 0.47mm/px · 1 of 100 slices shown (1 of 9)]
[im 1/100]
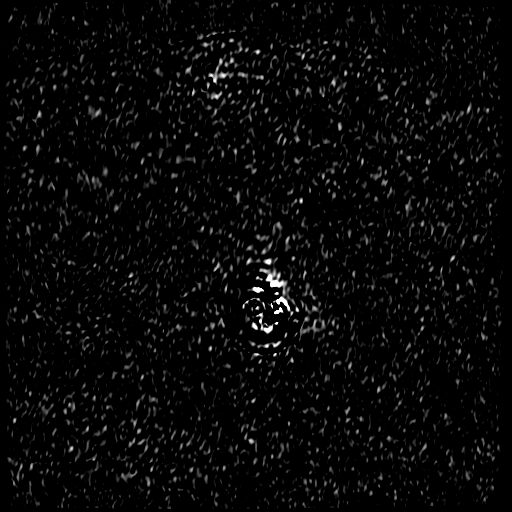

[((id)/(id)/1)-((id)/(id)/1) · axial · 1.8mm · 0.47mm/px · 1 of 100 slices shown (2 of 9)]
[im 1/100]
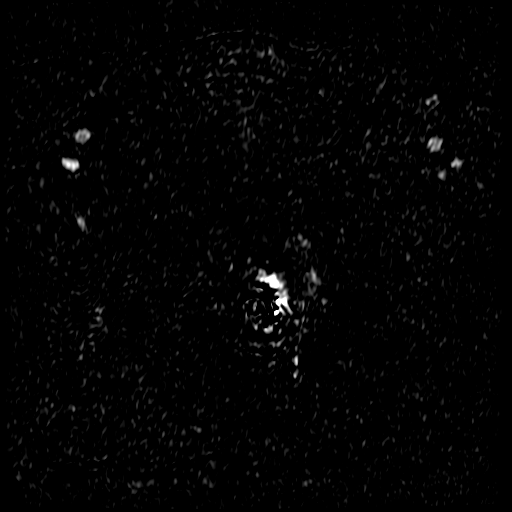

[((id)/(id)/1)-((id)/(id)/1) · axial · 1.8mm · 0.47mm/px · 1 of 100 slices shown (3 of 9)]
[im 1/100]
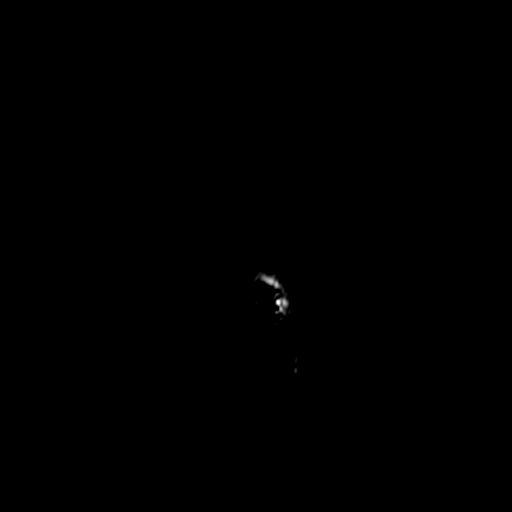

[((id)/(id)/1)-((id)/(id)/1) · axial · 1.8mm · 0.47mm/px · 1 of 99 slices shown (4 of 9)]
[im 1/99]
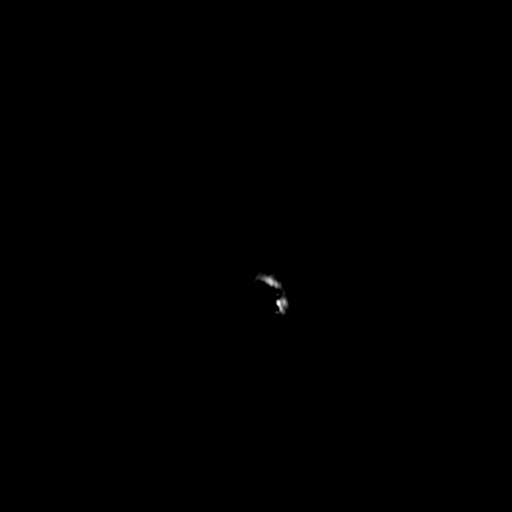

[((id)/(id)/1)-((id)/(id)/1) · axial · 1.8mm · 0.47mm/px · 1 of 100 slices shown (5 of 9)]
[im 1/100]
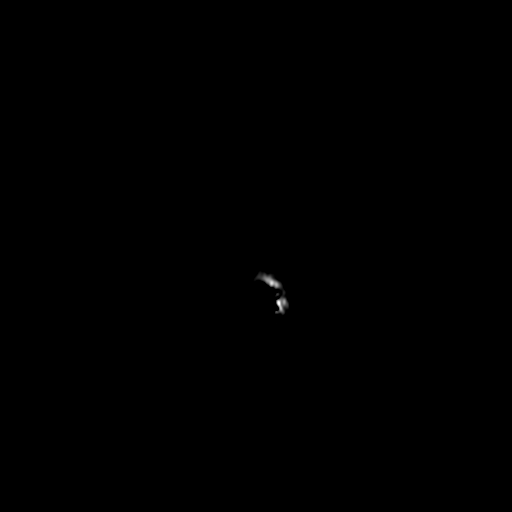

[((id)/(id)/1)-((id)/(id)/1) · axial · 1.8mm · 0.47mm/px · 1 of 100 slices shown (6 of 9)]
[im 1/100]
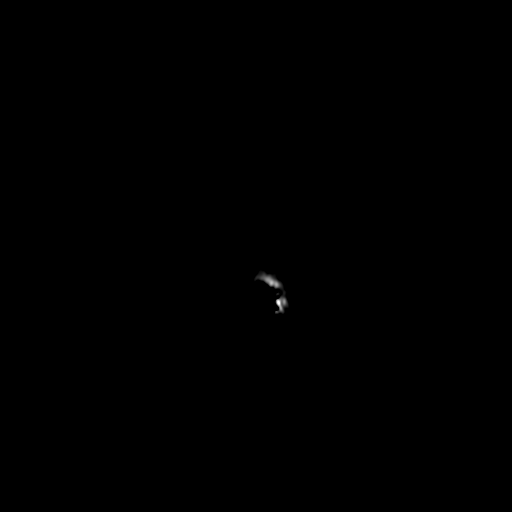

[((id)/(id)/1)-((id)/(id)/1) · axial · 1.8mm · 0.47mm/px · 1 of 100 slices shown (7 of 9)]
[im 1/100]
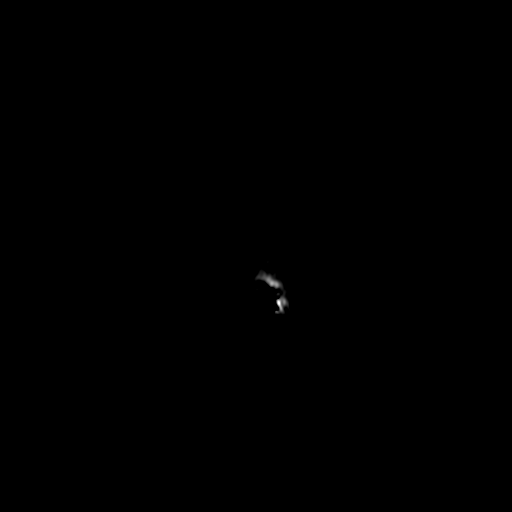

[((id)/(id)/1)-((id)/(id)/1) · axial · 1.8mm · 0.47mm/px · 1 of 100 slices shown (8 of 9)]
[im 1/100]
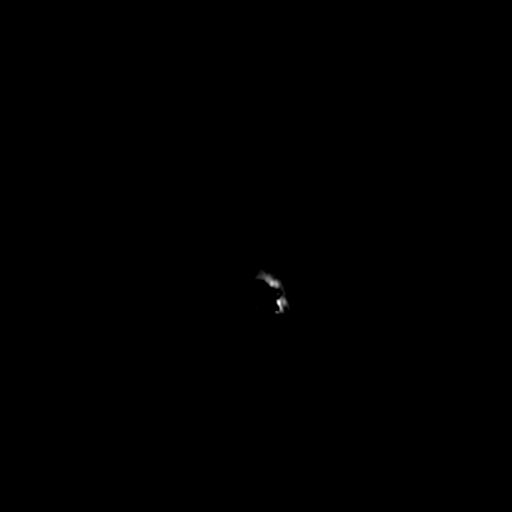

[((id)/(id)/1)-((id)/(id)/1) · axial · 1.8mm · 0.47mm/px · 1 of 100 slices shown (9 of 9)]
[im 1/100]
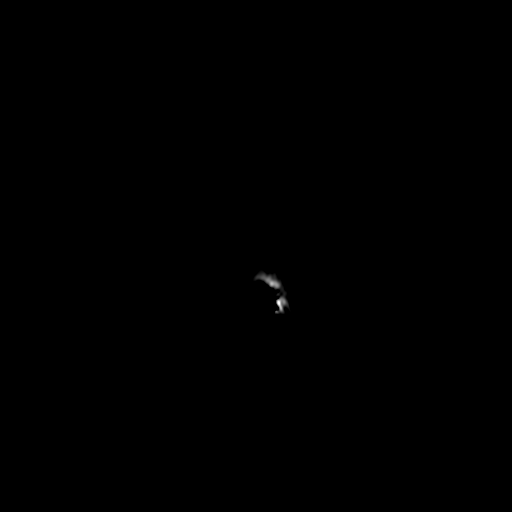

[23 of 55 positions shown; findings below may reference images not displayed]

FINDINGS: Degraded exam secondary to susceptibility artifact from bilateral
hip arthroplasties. This especially degrades (makes essentially
nondiagnostic) the diffusion-weighted images.

Prostate: No areas of masslike T2 hypointensity within the
peripheral zone of the prostate. Moderate central gland enlargement
and heterogeneity, consistent with benign prostatic hyperplasia. No
suspicious dominant central gland nodule. Vague early post-contrast
enhancement within the mid to apical gland is non masslike,
nonspecific but possibly related to prostatitis. Example image

Volume: 5.4 x 4.1 x 4.1 cm (volume = 48 cm^3)

Transcapsular spread:  Absent

Seminal vesicle involvement: Absent

Neurovascular bundle involvement: Absent

Pelvic adenopathy: Absent

Bone metastasis: Absent

Other findings: No significant free fluid. Grossly normal urinary
bladder. Tiny fat containing left inguinal hernia.
IMPRESSION: 1. Moderate degradation secondary to susceptibility artifact from
bilateral hip arthroplasties. This especially degrades the
diffusion-weighted images.
2. Given this limitation, no evidence of high-grade or macroscopic
prostate carcinoma identified.

## 2018-09-19 ENCOUNTER — Other Ambulatory Visit: Payer: Self-pay | Admitting: Internal Medicine

## 2018-09-28 ENCOUNTER — Encounter: Payer: Self-pay | Admitting: Internal Medicine

## 2018-10-15 ENCOUNTER — Encounter (HOSPITAL_COMMUNITY): Payer: Self-pay | Admitting: Emergency Medicine

## 2018-10-15 ENCOUNTER — Ambulatory Visit (HOSPITAL_COMMUNITY)
Admission: EM | Admit: 2018-10-15 | Discharge: 2018-10-15 | Disposition: A | Discharge status: Home / Self Care | Payer: PPO | Attending: Family Medicine | Admitting: Family Medicine

## 2018-10-15 DIAGNOSIS — J40 Bronchitis, not specified as acute or chronic: Secondary | ICD-10-CM | POA: Diagnosis not present

## 2018-10-15 DIAGNOSIS — R05 Cough: Secondary | ICD-10-CM

## 2018-10-15 MED ORDER — AZITHROMYCIN 250 MG PO TABS: ORAL_TABLET | ORAL | 0 refills | Status: AC

## 2018-10-15 MED ORDER — DEXAMETHASONE SODIUM PHOSPHATE 10 MG/ML IJ SOLN
INTRAMUSCULAR | Status: AC
  Filled 2018-10-15: qty 1

## 2018-10-15 MED ORDER — DEXAMETHASONE SODIUM PHOSPHATE 10 MG/ML IJ SOLN
10.0000 mg | Freq: Once | INTRAMUSCULAR | Status: AC
  Administered 2018-10-15: 10 mg via INTRAMUSCULAR

## 2018-10-15 MED ORDER — ALBUTEROL SULFATE HFA 108 (90 BASE) MCG/ACT IN AERS: 1.0000 | INHALATION_SPRAY | Freq: Four times a day (QID) | RESPIRATORY_TRACT | 0 refills | Status: DC | PRN

## 2018-10-15 NOTE — ED Notes (Signed)
Bed: UC01 Expected date:  Expected time:  Means of arrival:  Comments: Appointments 

## 2018-10-15 NOTE — ED Provider Notes (Signed)
[place]    CSN: [id number] Arrival date & time: 10/15/18  3299     History   Chief Complaint No chief complaint on file.   HPI Jonathan Young is a 67 y.o. male.   Jonathan Young presents with ongoing cough. Feels "raw" to his chest from cough. Has been 3 weeks. No fevers chills or body aches. No shortness of breath . States was much worse for the first week it started. Has improved some but isn't resolving. Worse at night. Has taken tessalon at night which has helped. Has been taking mucinex which has not necessarily helped either. Laughing or talking triggers the cough. No chest pain. No ear pain, no sore throat. No gi/gu complaints. States travelled in [place] and others he was with ended up with illness as well. Hx of OA, gerd, dysrhythmia, htn.     ROS per HPI.      Past Medical History:  Diagnosis Date  . Dysrhythmia    PVC'S  . GERD (gastroesophageal reflux disease)   . Hypercholesteremia   . Osteoarthritis     Patient Active Problem List   Diagnosis Date Noted  . Maxillary sinusitis, chronic 06/29/2017  . CRI (chronic renal insufficiency), stage 3 (moderate) ([place]) 04/07/2017  . Pure hyperglyceridemia 04/03/2017  . Essential hypertension 11/07/2016  . Hyperglycemia 04/03/2016  . GAD (generalized anxiety disorder) 10/17/2015  . Erectile dysfunction of organic origin 10/10/2015  . PSA elevation 01/10/2015  . High risk homosexual behavior 12/29/2014  . Routine general medical examination at a health care facility 12/29/2014  . GERD (gastroesophageal reflux disease) 04/23/2011  . IBS (irritable bowel syndrome) 04/23/2011  . Vitamin D deficiency 01/16/2011  . DUPUYTREN'S CONTRACTURE 01/16/2011  . Hypogonadism male 07/25/2010  . IDIOPATHIC OSTEOPOROSIS 11/01/2008  . ANXIETY DEPRESSION 07/26/2008  . Vitamin B 12 deficiency 03/22/2008  . Osteoarthritis 08/25/2007  . Hyperlipidemia with target LDL less than 130 08/03/2007    Past Surgical History:    Procedure Laterality Date  . [place]  . SHOULDER SURGERY     BIL  . TONSILLECTOMY AND ADENOIDECTOMY    . TOTAL HIP ARTHROPLASTY     RT TOAL HIP  . TOTAL HIP ARTHROPLASTY  06/01/2012   Procedure: TOTAL HIP ARTHROPLASTY ANTERIOR APPROACH;  Surgeon: Mauri Pole, MD;  Location: [facility];  Service: Orthopedics;  Laterality: Left;       Home Medications    Prior to Admission medications   Medication Sig Start Date End Date Taking? Authorizing Provider  albuterol (PROVENTIL HFA;VENTOLIN HFA) 108 (90 Base) MCG/ACT inhaler Inhale 1-2 puffs into the lungs every 6 (six) hours as needed for wheezing or shortness of breath. 10/15/18   Zigmund Gottron, NP  azithromycin (ZITHROMAX) 250 MG tablet Take 2 tablets (500 mg total) by mouth daily for 1 day, THEN 1 tablet (250 mg total) daily for 4 days. 10/15/18 10/20/18  Zigmund Gottron, NP  Cyanocobalamin (B-12 DOTS) 500 MCG TBDP Take 500 mcg by mouth daily.      [provider]  meloxicam (MOBIC) 15 MG tablet TAKE 1 TABLET BY MOUTH EVERY DAY 08/22/18   Janith Lima, MD  pantoprazole (PROTONIX) 20 MG tablet Take 20 mg by mouth daily.    [provider]  ranitidine (ZANTAC) 300 MG tablet Take 300 mg by mouth at bedtime.    [provider]  rosuvastatin (CRESTOR) 10 MG tablet Take 1 tablet (10 mg total) by mouth daily. 08/26/18   Janith Lima,  MD  tadalafil (CIALIS) 20 MG tablet TAKE 1 TABLET BY MOUTH ONE HOUR PRIOR TO INTERCOURSE; MAXIMUM 1 TABLET IN 24 HOURS ! 11/27/17   Janith Lima, MD  valACYclovir (VALTREX) 1000 MG tablet Take 0.5 tablets (500 mg total) by mouth daily. 08/26/18   Janith Lima, MD  Vitamin D, Ergocalciferol, (DRISDOL) 50000 units CAPS capsule TAKE ONE CAPSULE BY MOUTH EVERY WEEK 09/20/18   Janith Lima, MD    Family History Family History  Problem Relation Age of Onset  . Emphysema Mother   . Colon cancer Neg Hx   . Esophageal cancer Neg Hx   . Rectal cancer Neg Hx   .  Stomach cancer Neg Hx     Social History Social History   Tobacco Use  . Smoking status: Former Smoker    Last attempt to quit: 05/27/1981    Years since quitting: 37.4  . Smokeless tobacco: Never Used  Substance Use Topics  . Alcohol use: Yes    Alcohol/week: 14.0 standard drinks    Types: 14 Shots of liquor per week  . Drug use: No     Allergies   Ezetimibe-simvastatin   Review of Systems Review of Systems   Physical Exam Triage Vital Signs ED Triage Vitals  Enc Vitals Group     BP 10/15/18 1004 (!) 133/91     Pulse Rate 10/15/18 1004 71     Resp 10/15/18 1004 18     Temp 10/15/18 1004 (!) 97.3 F (36.3 C)     Temp Source 10/15/18 1004 Oral     SpO2 10/15/18 1004 100 %     Weight --      Height --      Head Circumference --      Peak Flow --      Pain Score 10/15/18 1008 0     Pain Loc --      Pain Edu? --      Excl. in [place]? --    No data found.  Updated Vital Signs BP (!) 133/91 (BP Location: Left Arm)   Pulse 71   Temp (!) 97.3 F (36.3 C) (Oral)   Resp 18   SpO2 100%    Physical Exam  Constitutional: He is oriented to person, place, and time. He appears well-developed and well-nourished.  HENT:  Head: Normocephalic and atraumatic.  Right Ear: Tympanic membrane, external ear and ear canal normal.  Left Ear: Tympanic membrane, external ear and ear canal normal.  Nose: Nose normal. Right sinus exhibits no maxillary sinus tenderness and no frontal sinus tenderness. Left sinus exhibits no maxillary sinus tenderness and no frontal sinus tenderness.  Mouth/Throat: Uvula is midline, oropharynx is clear and moist and mucous membranes are normal.  Eyes: Pupils are equal, round, and reactive to light. Conjunctivae are normal.  Neck: Normal range of motion.  Cardiovascular: Normal rate and regular rhythm.  Pulmonary/Chest: Effort normal and breath sounds normal. No respiratory distress.  Occasional dry cough noted   Lymphadenopathy:    He has no cervical  adenopathy.  Neurological: He is alert and oriented to person, place, and time.  Skin: Skin is warm and dry.  Vitals reviewed.    UC Treatments / Results  Labs (all labs ordered are listed, but only abnormal results are displayed) Labs Reviewed - No data to display  EKG None  Radiology No results found.  Procedures Procedures (including critical care time)  Medications Ordered in UC Medications  dexamethasone (DECADRON) injection 10  mg (has no administration in time range)    Initial Impression / Assessment and Plan / UC Course  I have reviewed the triage vital signs and the nursing notes.  Pertinent labs & imaging results that were available during my care of the patient were reviewed by me and considered in my medical decision making (see chart for details).     Three weeks of cough. Systemic symptoms have improved, did have ill contacts. Suspect bronchitis. Afebrile, no increased work of breathing, no shortness of breath , vitals stable. Imaging deferred. Patient declines prednisone today, requesting injection. im decadron provided, empiric azithromycin and use of inhaler prn provided. Return precautions provided. If symptoms worsen or do not improve in the next week to return to be seen or to follow up with PCP.  Patient verbalized understanding and agreeable to plan.   Final Clinical Impressions(s) / UC Diagnoses   Final diagnoses:  Bronchitis     Discharge Instructions     Push fluids to ensure adequate hydration and keep secretions thin.  Tylenol and/or ibuprofen as needed for pain or fevers.  Complete course of antibiotics.  Use of inhaler every 4-6 hours as needed for wheezing/chest tightness/shortness of breath . If symptoms worsen or do not improve in the next week to return to be seen or to follow up with your PCP.     ED Prescriptions    Medication Sig Dispense Auth. Provider   azithromycin (ZITHROMAX) 250 MG tablet Take 2 tablets (500 mg total) by  mouth daily for 1 day, THEN 1 tablet (250 mg total) daily for 4 days. 6 tablet Augusto Gamble B, NP   albuterol (PROVENTIL HFA;VENTOLIN HFA) 108 (90 Base) MCG/ACT inhaler Inhale 1-2 puffs into the lungs every 6 (six) hours as needed for wheezing or shortness of breath. 1 Inhaler Zigmund Gottron, NP     Controlled Substance Prescriptions [place] Controlled Substance Registry consulted? Not Applicable   Zigmund Gottron, NP 10/15/18 1055

## 2018-10-15 NOTE — ED Triage Notes (Signed)
Pt c/o cough x3 weeks, states he went to [place] several weeks ago and a few days after he returned home he started coughing. Denies any other symptoms.

## 2018-10-15 NOTE — Discharge Instructions (Signed)
Push fluids to ensure adequate hydration and keep secretions thin.  Tylenol and/or ibuprofen as needed for pain or fevers.  Complete course of antibiotics.  Use of inhaler every 4-6 hours as needed for wheezing/chest tightness/shortness of breath . If symptoms worsen or do not improve in the next week to return to be seen or to follow up with your PCP.

## 2018-10-21 ENCOUNTER — Other Ambulatory Visit: Payer: Self-pay | Admitting: Internal Medicine

## 2018-10-21 DIAGNOSIS — Z23 Encounter for immunization: Secondary | ICD-10-CM | POA: Diagnosis not present

## 2018-10-21 DIAGNOSIS — L814 Other melanin hyperpigmentation: Secondary | ICD-10-CM | POA: Diagnosis not present

## 2018-10-21 DIAGNOSIS — L821 Other seborrheic keratosis: Secondary | ICD-10-CM | POA: Diagnosis not present

## 2018-10-21 DIAGNOSIS — M150 Primary generalized (osteo)arthritis: Principal | ICD-10-CM

## 2018-10-21 DIAGNOSIS — Z85828 Personal history of other malignant neoplasm of skin: Secondary | ICD-10-CM | POA: Diagnosis not present

## 2018-10-21 DIAGNOSIS — M159 Polyosteoarthritis, unspecified: Secondary | ICD-10-CM

## 2018-10-21 DIAGNOSIS — D225 Melanocytic nevi of trunk: Secondary | ICD-10-CM | POA: Diagnosis not present

## 2018-11-10 DIAGNOSIS — R972 Elevated prostate specific antigen [PSA]: Secondary | ICD-10-CM | POA: Diagnosis not present

## 2018-11-20 ENCOUNTER — Other Ambulatory Visit: Payer: Self-pay | Admitting: Internal Medicine

## 2018-11-20 DIAGNOSIS — K2100 Gastro-esophageal reflux disease with esophagitis, without bleeding: Secondary | ICD-10-CM

## 2018-11-22 HISTORY — PX: CARPAL TUNNEL RELEASE: SHX101

## 2018-11-26 DIAGNOSIS — G5601 Carpal tunnel syndrome, right upper limb: Secondary | ICD-10-CM | POA: Diagnosis not present

## 2018-11-26 DIAGNOSIS — M25511 Pain in right shoulder: Secondary | ICD-10-CM | POA: Diagnosis not present

## 2018-12-03 DIAGNOSIS — M25511 Pain in right shoulder: Secondary | ICD-10-CM | POA: Diagnosis not present

## 2018-12-08 DIAGNOSIS — Y999 Unspecified external cause status: Secondary | ICD-10-CM | POA: Diagnosis not present

## 2018-12-08 DIAGNOSIS — M7541 Impingement syndrome of right shoulder: Secondary | ICD-10-CM | POA: Diagnosis not present

## 2018-12-08 DIAGNOSIS — S43491A Other sprain of right shoulder joint, initial encounter: Secondary | ICD-10-CM | POA: Diagnosis not present

## 2018-12-08 DIAGNOSIS — X58XXXA Exposure to other specified factors, initial encounter: Secondary | ICD-10-CM | POA: Diagnosis not present

## 2018-12-08 DIAGNOSIS — M7521 Bicipital tendinitis, right shoulder: Secondary | ICD-10-CM | POA: Diagnosis not present

## 2018-12-08 DIAGNOSIS — G5601 Carpal tunnel syndrome, right upper limb: Secondary | ICD-10-CM | POA: Diagnosis not present

## 2018-12-08 DIAGNOSIS — S46291A Other injury of muscle, fascia and tendon of other parts of biceps, right arm, initial encounter: Secondary | ICD-10-CM | POA: Diagnosis not present

## 2018-12-08 DIAGNOSIS — M19011 Primary osteoarthritis, right shoulder: Secondary | ICD-10-CM | POA: Diagnosis not present

## 2018-12-08 DIAGNOSIS — M24111 Other articular cartilage disorders, right shoulder: Secondary | ICD-10-CM | POA: Diagnosis not present

## 2018-12-08 DIAGNOSIS — S46011A Strain of muscle(s) and tendon(s) of the rotator cuff of right shoulder, initial encounter: Secondary | ICD-10-CM | POA: Diagnosis not present

## 2018-12-08 DIAGNOSIS — G8918 Other acute postprocedural pain: Secondary | ICD-10-CM | POA: Diagnosis not present

## 2018-12-11 DIAGNOSIS — M75121 Complete rotator cuff tear or rupture of right shoulder, not specified as traumatic: Secondary | ICD-10-CM | POA: Diagnosis not present

## 2018-12-11 DIAGNOSIS — S46111D Strain of muscle, fascia and tendon of long head of biceps, right arm, subsequent encounter: Secondary | ICD-10-CM | POA: Diagnosis not present

## 2018-12-11 DIAGNOSIS — M25611 Stiffness of right shoulder, not elsewhere classified: Secondary | ICD-10-CM | POA: Diagnosis not present

## 2018-12-15 DIAGNOSIS — M25611 Stiffness of right shoulder, not elsewhere classified: Secondary | ICD-10-CM | POA: Diagnosis not present

## 2018-12-21 DIAGNOSIS — M25611 Stiffness of right shoulder, not elsewhere classified: Secondary | ICD-10-CM | POA: Diagnosis not present

## 2018-12-24 DIAGNOSIS — S46111D Strain of muscle, fascia and tendon of long head of biceps, right arm, subsequent encounter: Secondary | ICD-10-CM | POA: Diagnosis not present

## 2018-12-24 DIAGNOSIS — M75121 Complete rotator cuff tear or rupture of right shoulder, not specified as traumatic: Secondary | ICD-10-CM | POA: Diagnosis not present

## 2018-12-24 DIAGNOSIS — M25611 Stiffness of right shoulder, not elsewhere classified: Secondary | ICD-10-CM | POA: Diagnosis not present

## 2018-12-25 ENCOUNTER — Other Ambulatory Visit: Payer: Self-pay | Admitting: Internal Medicine

## 2018-12-29 DIAGNOSIS — M25611 Stiffness of right shoulder, not elsewhere classified: Secondary | ICD-10-CM | POA: Diagnosis not present

## 2018-12-29 DIAGNOSIS — S46111D Strain of muscle, fascia and tendon of long head of biceps, right arm, subsequent encounter: Secondary | ICD-10-CM | POA: Diagnosis not present

## 2018-12-29 DIAGNOSIS — M75121 Complete rotator cuff tear or rupture of right shoulder, not specified as traumatic: Secondary | ICD-10-CM | POA: Diagnosis not present

## 2018-12-31 DIAGNOSIS — M75121 Complete rotator cuff tear or rupture of right shoulder, not specified as traumatic: Secondary | ICD-10-CM | POA: Diagnosis not present

## 2018-12-31 DIAGNOSIS — R531 Weakness: Secondary | ICD-10-CM | POA: Diagnosis not present

## 2018-12-31 DIAGNOSIS — M25611 Stiffness of right shoulder, not elsewhere classified: Secondary | ICD-10-CM | POA: Diagnosis not present

## 2018-12-31 DIAGNOSIS — S46111D Strain of muscle, fascia and tendon of long head of biceps, right arm, subsequent encounter: Secondary | ICD-10-CM | POA: Diagnosis not present

## 2019-01-05 DIAGNOSIS — S46111D Strain of muscle, fascia and tendon of long head of biceps, right arm, subsequent encounter: Secondary | ICD-10-CM | POA: Diagnosis not present

## 2019-01-05 DIAGNOSIS — M75121 Complete rotator cuff tear or rupture of right shoulder, not specified as traumatic: Secondary | ICD-10-CM | POA: Diagnosis not present

## 2019-01-05 DIAGNOSIS — M25611 Stiffness of right shoulder, not elsewhere classified: Secondary | ICD-10-CM | POA: Diagnosis not present

## 2019-01-11 DIAGNOSIS — S46111D Strain of muscle, fascia and tendon of long head of biceps, right arm, subsequent encounter: Secondary | ICD-10-CM | POA: Diagnosis not present

## 2019-01-11 DIAGNOSIS — M75121 Complete rotator cuff tear or rupture of right shoulder, not specified as traumatic: Secondary | ICD-10-CM | POA: Diagnosis not present

## 2019-01-11 DIAGNOSIS — M25611 Stiffness of right shoulder, not elsewhere classified: Secondary | ICD-10-CM | POA: Diagnosis not present

## 2019-01-11 DIAGNOSIS — R531 Weakness: Secondary | ICD-10-CM | POA: Diagnosis not present

## 2019-01-18 DIAGNOSIS — R531 Weakness: Secondary | ICD-10-CM | POA: Diagnosis not present

## 2019-01-18 DIAGNOSIS — S46111D Strain of muscle, fascia and tendon of long head of biceps, right arm, subsequent encounter: Secondary | ICD-10-CM | POA: Diagnosis not present

## 2019-01-18 DIAGNOSIS — M75121 Complete rotator cuff tear or rupture of right shoulder, not specified as traumatic: Secondary | ICD-10-CM | POA: Diagnosis not present

## 2019-01-18 DIAGNOSIS — M25611 Stiffness of right shoulder, not elsewhere classified: Secondary | ICD-10-CM | POA: Diagnosis not present

## 2019-01-21 DIAGNOSIS — M75121 Complete rotator cuff tear or rupture of right shoulder, not specified as traumatic: Secondary | ICD-10-CM | POA: Diagnosis not present

## 2019-01-21 DIAGNOSIS — M25611 Stiffness of right shoulder, not elsewhere classified: Secondary | ICD-10-CM | POA: Diagnosis not present

## 2019-01-21 DIAGNOSIS — S46111D Strain of muscle, fascia and tendon of long head of biceps, right arm, subsequent encounter: Secondary | ICD-10-CM | POA: Diagnosis not present

## 2019-01-26 ENCOUNTER — Encounter: Payer: Self-pay | Admitting: Internal Medicine

## 2019-01-26 ENCOUNTER — Other Ambulatory Visit: Payer: Self-pay | Admitting: Internal Medicine

## 2019-01-26 DIAGNOSIS — N529 Male erectile dysfunction, unspecified: Secondary | ICD-10-CM

## 2019-01-27 ENCOUNTER — Other Ambulatory Visit: Payer: Self-pay | Admitting: Internal Medicine

## 2019-01-27 DIAGNOSIS — F411 Generalized anxiety disorder: Secondary | ICD-10-CM

## 2019-01-27 MED ORDER — ALPRAZOLAM 0.5 MG PO TABS: 0.5000 mg | ORAL_TABLET | Freq: Every evening | ORAL | 1 refills | Status: DC | PRN

## 2019-01-28 DIAGNOSIS — M25611 Stiffness of right shoulder, not elsewhere classified: Secondary | ICD-10-CM | POA: Diagnosis not present

## 2019-01-28 DIAGNOSIS — S46111D Strain of muscle, fascia and tendon of long head of biceps, right arm, subsequent encounter: Secondary | ICD-10-CM | POA: Diagnosis not present

## 2019-01-28 DIAGNOSIS — M75121 Complete rotator cuff tear or rupture of right shoulder, not specified as traumatic: Secondary | ICD-10-CM | POA: Diagnosis not present

## 2019-02-10 DIAGNOSIS — M75121 Complete rotator cuff tear or rupture of right shoulder, not specified as traumatic: Secondary | ICD-10-CM | POA: Diagnosis not present

## 2019-02-10 DIAGNOSIS — M25611 Stiffness of right shoulder, not elsewhere classified: Secondary | ICD-10-CM | POA: Diagnosis not present

## 2019-02-10 DIAGNOSIS — S46111D Strain of muscle, fascia and tendon of long head of biceps, right arm, subsequent encounter: Secondary | ICD-10-CM | POA: Diagnosis not present

## 2019-02-11 DIAGNOSIS — M25611 Stiffness of right shoulder, not elsewhere classified: Secondary | ICD-10-CM | POA: Diagnosis not present

## 2019-02-18 DIAGNOSIS — R972 Elevated prostate specific antigen [PSA]: Secondary | ICD-10-CM | POA: Diagnosis not present

## 2019-02-18 LAB — PSA: PSA: 6.29

## 2019-02-23 DIAGNOSIS — R531 Weakness: Secondary | ICD-10-CM | POA: Diagnosis not present

## 2019-02-23 DIAGNOSIS — S46111D Strain of muscle, fascia and tendon of long head of biceps, right arm, subsequent encounter: Secondary | ICD-10-CM | POA: Diagnosis not present

## 2019-02-23 DIAGNOSIS — M25611 Stiffness of right shoulder, not elsewhere classified: Secondary | ICD-10-CM | POA: Diagnosis not present

## 2019-02-23 DIAGNOSIS — M75121 Complete rotator cuff tear or rupture of right shoulder, not specified as traumatic: Secondary | ICD-10-CM | POA: Diagnosis not present

## 2019-02-25 DIAGNOSIS — N401 Enlarged prostate with lower urinary tract symptoms: Secondary | ICD-10-CM | POA: Diagnosis not present

## 2019-02-25 DIAGNOSIS — R972 Elevated prostate specific antigen [PSA]: Secondary | ICD-10-CM | POA: Diagnosis not present

## 2019-02-25 DIAGNOSIS — R3912 Poor urinary stream: Secondary | ICD-10-CM | POA: Diagnosis not present

## 2019-03-09 DIAGNOSIS — S46111D Strain of muscle, fascia and tendon of long head of biceps, right arm, subsequent encounter: Secondary | ICD-10-CM | POA: Diagnosis not present

## 2019-03-09 DIAGNOSIS — M75121 Complete rotator cuff tear or rupture of right shoulder, not specified as traumatic: Secondary | ICD-10-CM | POA: Diagnosis not present

## 2019-03-09 DIAGNOSIS — M25611 Stiffness of right shoulder, not elsewhere classified: Secondary | ICD-10-CM | POA: Diagnosis not present

## 2019-03-11 DIAGNOSIS — M25611 Stiffness of right shoulder, not elsewhere classified: Secondary | ICD-10-CM | POA: Diagnosis not present

## 2019-04-02 ENCOUNTER — Ambulatory Visit (INDEPENDENT_AMBULATORY_CARE_PROVIDER_SITE_OTHER): Payer: PPO

## 2019-04-02 ENCOUNTER — Other Ambulatory Visit: Payer: Self-pay

## 2019-04-02 ENCOUNTER — Ambulatory Visit (INDEPENDENT_AMBULATORY_CARE_PROVIDER_SITE_OTHER)
Admission: EM | Admit: 2019-04-02 | Discharge: 2019-04-02 | Disposition: A | Discharge status: Home / Self Care | Payer: PPO | Source: Home / Self Care

## 2019-04-02 ENCOUNTER — Encounter (HOSPITAL_COMMUNITY): Payer: Self-pay

## 2019-04-02 ENCOUNTER — Telehealth: Payer: PPO | Admitting: Family

## 2019-04-02 ENCOUNTER — Emergency Department (HOSPITAL_COMMUNITY): Payer: PPO

## 2019-04-02 ENCOUNTER — Emergency Department (HOSPITAL_COMMUNITY)
Admission: EM | Admit: 2019-04-02 | Discharge: 2019-04-02 | Disposition: A | Discharge status: Home / Self Care | Payer: PPO | Attending: Emergency Medicine | Admitting: Emergency Medicine

## 2019-04-02 ENCOUNTER — Encounter (HOSPITAL_COMMUNITY): Payer: Self-pay | Admitting: Emergency Medicine

## 2019-04-02 DIAGNOSIS — M546 Pain in thoracic spine: Secondary | ICD-10-CM

## 2019-04-02 DIAGNOSIS — R1031 Right lower quadrant pain: Secondary | ICD-10-CM | POA: Insufficient documentation

## 2019-04-02 DIAGNOSIS — R109 Unspecified abdominal pain: Secondary | ICD-10-CM

## 2019-04-02 DIAGNOSIS — Z1159 Encounter for screening for other viral diseases: Secondary | ICD-10-CM | POA: Diagnosis not present

## 2019-04-02 DIAGNOSIS — M549 Dorsalgia, unspecified: Secondary | ICD-10-CM | POA: Diagnosis not present

## 2019-04-02 DIAGNOSIS — H2513 Age-related nuclear cataract, bilateral: Secondary | ICD-10-CM | POA: Diagnosis not present

## 2019-04-02 DIAGNOSIS — Z87891 Personal history of nicotine dependence: Secondary | ICD-10-CM | POA: Insufficient documentation

## 2019-04-02 DIAGNOSIS — Z1211 Encounter for screening for malignant neoplasm of colon: Secondary | ICD-10-CM | POA: Diagnosis not present

## 2019-04-02 DIAGNOSIS — Z1231 Encounter for screening mammogram for malignant neoplasm of breast: Secondary | ICD-10-CM | POA: Diagnosis not present

## 2019-04-02 DIAGNOSIS — N200 Calculus of kidney: Secondary | ICD-10-CM | POA: Diagnosis not present

## 2019-04-02 DIAGNOSIS — I5033 Acute on chronic diastolic (congestive) heart failure: Secondary | ICD-10-CM | POA: Diagnosis not present

## 2019-04-02 DIAGNOSIS — N183 Chronic kidney disease, stage 3 (moderate): Secondary | ICD-10-CM | POA: Diagnosis not present

## 2019-04-02 DIAGNOSIS — L8946 Pressure-induced deep tissue damage of contiguous site of back, buttock and hip: Secondary | ICD-10-CM | POA: Diagnosis not present

## 2019-04-02 DIAGNOSIS — E440 Moderate protein-calorie malnutrition: Secondary | ICD-10-CM | POA: Diagnosis not present

## 2019-04-02 DIAGNOSIS — I129 Hypertensive chronic kidney disease with stage 1 through stage 4 chronic kidney disease, or unspecified chronic kidney disease: Secondary | ICD-10-CM | POA: Insufficient documentation

## 2019-04-02 DIAGNOSIS — H16223 Keratoconjunctivitis sicca, not specified as Sjogren's, bilateral: Secondary | ICD-10-CM | POA: Diagnosis not present

## 2019-04-02 DIAGNOSIS — K219 Gastro-esophageal reflux disease without esophagitis: Secondary | ICD-10-CM | POA: Diagnosis not present

## 2019-04-02 DIAGNOSIS — R140 Abdominal distension (gaseous): Secondary | ICD-10-CM

## 2019-04-02 DIAGNOSIS — H5711 Ocular pain, right eye: Secondary | ICD-10-CM | POA: Diagnosis not present

## 2019-04-02 DIAGNOSIS — I6523 Occlusion and stenosis of bilateral carotid arteries: Secondary | ICD-10-CM | POA: Diagnosis not present

## 2019-04-02 LAB — COMPREHENSIVE METABOLIC PANEL
ALT: 24 U/L (ref 0–44)
AST: 21 U/L (ref 15–41)
Albumin: 3.9 g/dL (ref 3.5–5.0)
Alkaline Phosphatase: 68 U/L (ref 38–126)
Anion gap: 9 (ref 5–15)
BUN: 15 mg/dL (ref 8–23)
CO2: 25 mmol/L (ref 22–32)
Calcium: 9.6 mg/dL (ref 8.9–10.3)
Chloride: 102 mmol/L (ref 98–111)
Creatinine, Ser: 1.16 mg/dL (ref 0.61–1.24)
GFR calc Af Amer: >60 mL/min (ref >60)
GFR calc non Af Amer: >60 mL/min (ref >60)
Glucose, Bld: 111 mg/dL — ABNORMAL HIGH (ref 70–99)
Potassium: 4.7 mmol/L (ref 3.5–5.1)
Sodium: 136 mmol/L (ref 135–145)
Total Bilirubin: 0.7 mg/dL (ref 0.3–1.2)
Total Protein: 6.7 g/dL (ref 6.5–8.1)

## 2019-04-02 LAB — URINALYSIS, ROUTINE W REFLEX MICROSCOPIC
Bilirubin Urine: NEGATIVE
Glucose, UA: NEGATIVE mg/dL
Hgb urine dipstick: NEGATIVE
Ketones, ur: 20 mg/dL — AB
Leukocytes,Ua: NEGATIVE
Nitrite: NEGATIVE
Protein, ur: NEGATIVE mg/dL
Specific Gravity, Urine: 1.017 (ref 1.005–1.030)
pH: 5 (ref 5.0–8.0)

## 2019-04-02 LAB — CBC WITH DIFFERENTIAL/PLATELET
Abs Immature Granulocytes: 0.02 10*3/uL (ref 0.00–0.07)
Basophils Absolute: 0 10*3/uL (ref 0.0–0.1)
Basophils Relative: 0 %
Eosinophils Absolute: 0.3 10*3/uL (ref 0.0–0.5)
Eosinophils Relative: 4 %
HCT: 43.1 % (ref 39.0–52.0)
Hemoglobin: 14.1 g/dL (ref 13.0–17.0)
Immature Granulocytes: 0 %
Lymphocytes Relative: 30 %
Lymphs Abs: 2.4 10*3/uL (ref 0.7–4.0)
MCH: 30.8 pg (ref 26.0–34.0)
MCHC: 32.7 g/dL (ref 30.0–36.0)
MCV: 94.1 fL (ref 80.0–100.0)
Monocytes Absolute: 0.8 10*3/uL (ref 0.1–1.0)
Monocytes Relative: 9 %
Neutro Abs: 4.6 10*3/uL (ref 1.7–7.7)
Neutrophils Relative %: 57 %
Platelets: 211 10*3/uL (ref 150–400)
RBC: 4.58 MIL/uL (ref 4.22–5.81)
RDW: 11.9 % (ref 11.5–15.5)
WBC: 8.2 10*3/uL (ref 4.0–10.5)
nRBC: 0 % (ref 0.0–0.2)

## 2019-04-02 LAB — POCT URINALYSIS DIP (DEVICE)
Bilirubin Urine: NEGATIVE
Glucose, UA: NEGATIVE mg/dL
Hgb urine dipstick: NEGATIVE
Ketones, ur: NEGATIVE mg/dL
Leukocytes,Ua: NEGATIVE
Nitrite: NEGATIVE
Protein, ur: NEGATIVE mg/dL
Specific Gravity, Urine: 1.025 (ref 1.005–1.030)
Urobilinogen, UA: 0.2 mg/dL (ref 0.0–1.0)
pH: 6 (ref 5.0–8.0)

## 2019-04-02 LAB — LIPASE, BLOOD: Lipase: 56 U/L — ABNORMAL HIGH (ref 11–51)

## 2019-04-02 NOTE — ED Provider Notes (Signed)
Emergency Department Provider Note   I have reviewed the triage vital signs and the nursing notes.   HISTORY  Chief Complaint Nephrolithiasis   HPI Jonathan Young is a 68 y.o. male with PMH HLD and OA presents to the emergency department for evaluation of lower back/flank pain.  Patient states that symptoms were severe last night but have improved through the day today.  He denies any fevers or chills.  He has a urologist, Dr. Junious Silk, at [facility].  Patient initially went to urgent care where a plain film was performed which showed a right-sided stone measuring approximately 12 mm.  Patient denies any difficulty urinating.  His pain is well controlled.  No chest pain or shortness of breath. No radiation or modifying factors.    Past Medical History:  Diagnosis Date  . Dysrhythmia    PVC'S  . GERD (gastroesophageal reflux disease)   . Hypercholesteremia   . Osteoarthritis     Patient Active Problem List   Diagnosis Date Noted  . Maxillary sinusitis, chronic 06/29/2017  . CRI (chronic renal insufficiency), stage 3 (moderate) ([place]) 04/07/2017  . Pure hyperglyceridemia 04/03/2017  . Essential hypertension 11/07/2016  . Hyperglycemia 04/03/2016  . GAD (generalized anxiety disorder) 10/17/2015  . Erectile dysfunction of organic origin 10/10/2015  . PSA elevation 01/10/2015  . High risk homosexual behavior 12/29/2014  . Routine general medical examination at a health care facility 12/29/2014  . GERD (gastroesophageal reflux disease) 04/23/2011  . IBS (irritable bowel syndrome) 04/23/2011  . Vitamin D deficiency 01/16/2011  . DUPUYTREN'S CONTRACTURE 01/16/2011  . Hypogonadism male 07/25/2010  . IDIOPATHIC OSTEOPOROSIS 11/01/2008  . ANXIETY DEPRESSION 07/26/2008  . Vitamin B 12 deficiency 03/22/2008  . Osteoarthritis 08/25/2007  . Hyperlipidemia with target LDL less than 130 08/03/2007    Past Surgical History:  Procedure Laterality Date  . [place]  . SHOULDER SURGERY     BIL  . TONSILLECTOMY AND ADENOIDECTOMY    . TOTAL HIP ARTHROPLASTY     RT TOAL HIP  . TOTAL HIP ARTHROPLASTY  06/01/2012   Procedure: TOTAL HIP ARTHROPLASTY ANTERIOR APPROACH;  Surgeon: Mauri Pole, MD;  Location: [facility];  Service: Orthopedics;  Laterality: Left;    Allergies Ezetimibe-simvastatin  Family History  Problem Relation Age of Onset  . Emphysema Mother   . Colon cancer Neg Hx   . Esophageal cancer Neg Hx   . Rectal cancer Neg Hx   . Stomach cancer Neg Hx     Social History Social History   Tobacco Use  . Smoking status: Former Smoker    Last attempt to quit: 05/27/1981    Years since quitting: 37.8  . Smokeless tobacco: Never Used  Substance Use Topics  . Alcohol use: Yes    Alcohol/week: 14.0 standard drinks    Types: 14 Shots of liquor per week  . Drug use: No    Review of Systems  Constitutional: No fever/chills Eyes: No visual changes. ENT: No sore throat. Cardiovascular: Denies chest pain. Respiratory: Denies shortness of breath. Gastrointestinal: No abdominal pain.  No nausea, no vomiting.  No diarrhea.  No constipation. Genitourinary: Negative for dysuria. Positive flank pain.  Musculoskeletal: Positive for back pain. Skin: Negative for rash. Neurological: Negative for headaches, focal weakness or numbness.  10-point ROS otherwise negative.  ____________________________________________   PHYSICAL EXAM:  VITAL SIGNS: ED Triage Vitals  Enc Vitals Group     BP 04/02/19 1622 (!) 151/99     Pulse  Rate 04/02/19 1622 89     Resp 04/02/19 1622 18     Temp 04/02/19 1622 97.7 F (36.5 C)     Temp Source 04/02/19 1622 Oral     SpO2 04/02/19 1622 99 %     Pain Score 04/02/19 1623 0   Constitutional: Alert and oriented. Well appearing and in no acute distress. Eyes: Conjunctivae are normal. Head: Atraumatic. Nose: No congestion/rhinnorhea. Mouth/Throat: Mucous membranes are moist.  Neck: No stridor.    Cardiovascular: Normal rate, regular rhythm. Good peripheral circulation. Grossly normal heart sounds.   Respiratory: Normal respiratory effort.  No retractions. Lungs CTAB. Gastrointestinal: Soft and nontender. No distention. No CVA tenderness.  Musculoskeletal: No lower extremity tenderness nor edema. No gross deformities of extremities. Neurologic:  Normal speech and language. No gross focal neurologic deficits are appreciated.  Skin:  Skin is warm, dry and intact. No rash noted.  ____________________________________________   LABS (all labs ordered are listed, but only abnormal results are displayed)  Labs Reviewed  URINALYSIS, ROUTINE W REFLEX MICROSCOPIC - Abnormal; Notable for the following components:      Result Value   APPearance HAZY (*)    Ketones, ur 20 (*)    All other components within normal limits  COMPREHENSIVE METABOLIC PANEL - Abnormal; Notable for the following components:   Glucose, Bld 111 (*)    All other components within normal limits  LIPASE, BLOOD - Abnormal; Notable for the following components:   Lipase 56 (*)    All other components within normal limits  URINE CULTURE  CBC WITH DIFFERENTIAL/PLATELET   ____________________________________________  RADIOLOGY  Dg Abd 1 View  Result Date: 04/02/2019 CLINICAL DATA:  Nephrolithiasis EXAM: ABDOMEN - 1 VIEW COMPARISON:  None. FINDINGS: There is a calcification projecting in the expected location of the right renal collecting system, measuring 12 mm. No calcification projecting over the expected course of the ureters. Bowel gas pattern is normal. Bilateral total hip arthroplasties. IMPRESSION: Right nephrolithiasis. Electronically Signed   By: Ulyses Jarred M.D.   On: 04/02/2019 15:59   Ct Renal Stone Study  Result Date: 04/02/2019 CLINICAL DATA:  RIGHT flank pain. Kidney stone. EXAM: CT ABDOMEN AND PELVIS WITHOUT CONTRAST TECHNIQUE: Multidetector CT imaging of the abdomen and pelvis was performed following  the standard protocol without IV contrast. COMPARISON:  Plain film of the abdomen from earlier same day. FINDINGS: Lower chest: No acute abnormality. Hepatobiliary: No focal liver abnormality is seen. No gallstones, gallbladder wall thickening, or biliary dilatation. Pancreas: Questionable subtle fluid stranding about the pancreatic head. Pancreatic body and tail appear normal. Spleen: Normal in size without focal abnormality. Adrenals/Urinary Tract: Both kidneys are unremarkable without mass, stone or hydronephrosis. No perinephric fluid. No ureteral calculi identified. Bladder is mostly obscured by metallic artifact emanating from patient's bilateral hip prostheses. Stomach/Bowel: Bowel is normal in caliber. No secondary signs of bowel wall inflammation. Appendix is normal. Stomach is unremarkable. Vascular/Lymphatic: Mild aortic atherosclerosis. No enlarged lymph nodes seen. Reproductive: Unremarkable, partially obscured by the metallic artifact from patient's bilateral hip prostheses. Other: No abscess collection seen. No free intraperitoneal air. Musculoskeletal: The suspected RIGHT renal stone demonstrated on plain film from earlier today is actually a benign appearing soft tissue calcification along the under surface of the RIGHT hemidiaphragm posterior to the upper pole of the RIGHT kidney. Degenerative spondylosis at the L4-5 and L5-S1 levels, moderate in degree. No acute appearing osseous abnormality. IMPRESSION: 1. Questionable subtle fluid stranding about the pancreatic head, suspicious for mild acute pancreatitis.  Recommend correlation with pancreatic lab values. 2. No renal or ureteral calculi. No hydronephrosis. The questioned RIGHT renal stone on today's earlier plain film examination corresponds to a benign appearing soft tissue calcification posterior to the RIGHT kidney. 3. Additional chronic/incidental findings detailed above. Aortic Atherosclerosis (ICD10-I70.0). Electronically Signed   By: Franki Cabot M.D.   On: 04/02/2019 19:07    ____________________________________________   PROCEDURES  Procedure(s) performed:   Procedures  None  ____________________________________________   INITIAL IMPRESSION / ASSESSMENT AND PLAN / ED COURSE  Pertinent labs & imaging results that were available during my care of the patient were reviewed by me and considered in my medical decision making (see chart for details).   Patient presents to the emergency department for evaluation of lower back discomfort.  Found to have a 12 mm stone on plain film at urgent care and sent here for further evaluation.  Patient is pain-free and without abnormal vital sign.  No fever.  No concern for developing urosepsis. Plan for CT renal and UA with culture.   08:00 PM  CT imaging reviewed with the patient.  No evidence of ureterolithiasis.  Patient has a benign calcification on CT.  No follow-up recommended by radiology.  Sent off labs which show very mild elevation in lipase.  Discussed the possibility of mild pancreatitis symptoms.  Patient's pain is resolved at this time.  No nausea or vomiting.  Discussed that alcohol may be a trigger for this and he can discuss further with his PCP in the coming week.  Discussed ED return precautions. No medication at discharge.  ____________________________________________  FINAL CLINICAL IMPRESSION(S) / ED DIAGNOSES  Final diagnoses:  Flank pain    Note:  This document was prepared using Dragon voice recognition software and may include unintentional dictation errors.  Nanda Quinton, MD Emergency Medicine    Devondre Guzzetta, [facility], MD 04/02/19 2011

## 2019-04-02 NOTE — ED Notes (Signed)
Reviewed d/c instructions with pt, who verbalized understanding and had no outstanding questions. Pt departed in NAD, refused use of wheelchair.   

## 2019-04-02 NOTE — ED Notes (Signed)
Patient transported to X-ray 

## 2019-04-02 NOTE — Progress Notes (Signed)
Based on what you shared with me, I feel your condition warrants further evaluation and I recommend that you be seen for a face to face office visit.     NOTE: If you entered your credit card information for this eVisit, you will not be charged. You may see a "hold" on your card for the $35 but that hold will drop off and you will not have a charge processed.  If you are having a true medical emergency please call 911.  If you need an urgent face to face visit, [facility] has four urgent care centers for your convenience.    PLEASE NOTE: THE [facility] LOCATIONS AND URGENT CARE CLINICS DO NOT HAVE THE TESTING FOR CORONAVIRUS COVID19 AVAILABLE.  IF YOU FEEL YOU NEED THIS TEST YOU MUST GO TO A TRIAGE LOCATION AT ONE OF THE HOSPITAL EMERGENCY DEPARTMENTS   [email or web address] to reserve your spot online an avoid wait times  [facility] [address], [address] [place], [place] [address] Modified hours of operation: Monday-Friday, 10 AM to 6 PM  Saturday & Sunday 10 AM to 4 PM *Across the street from [employer]  [facility] (New Address!) [address], [address] [place], [place] [address] *Just off [address], across the road from [employer]* Modified hours of operation: Monday-Friday, 10 AM to 5 PM  Closed Saturday & Sunday   The following sites will take your insurance:  . [facility]  [phone] Get Driving Directions Find a Provider at this Location  [address] [place], [place] [address] . 10 am to 8 pm Monday-Friday . 12 pm to 8 pm Saturday-Sunday   . [facility]  [phone] Get Driving Directions Find a Provider at this Location  [address], [address] [place], [place] [address] . 8 am to 8 pm Monday-Friday . 9 am to 6 pm Saturday . 11 am to 6 pm Sunday   . [facility]  [phone] Get Driving Directions  [address] [place], [place] [address] . 8 am to 8 pm Monday-Friday . 8 am to 4 pm Saturday-Sunday   Your e-visit answers were reviewed by a board certified advanced clinical practitioner to complete your personal care plan.  Thank you for using e-Visits. 

## 2019-04-02 NOTE — Discharge Instructions (Signed)
You have been seen in the Emergency Department (ED) for abdominal pain.  Your evaluation did not identify a clear cause of your symptoms but was generally reassuring.  Please follow up as instructed above regarding todays emergent visit and the symptoms that are bothering you.  You may have had a mild flare of pancreatitis. This can be caused by many things but the most common is drinking alcohol to excess. Try to avoid this and drink plenty of fluids.   Return to the ED if your abdominal pain worsens or fails to improve, you develop bloody vomiting, bloody diarrhea, you are unable to tolerate fluids due to vomiting, fever greater than 101, or other symptoms that concern you.

## 2019-04-02 NOTE — ED Triage Notes (Signed)
Patient sent by [facility] for further evaluation of R flank pain - had x-ray done that showed 69mm kidney stone. Patient reports pain for 3 days, but currently pain free. Denies N/V, fevers/chills.

## 2019-04-02 NOTE — ED Provider Notes (Signed)
[name]    CSN: [id number] Arrival date & time: 04/02/19  1440     History   Chief Complaint Chief Complaint  Patient presents with  . Flank Pain    HPI Jonathan Young is a 68 y.o. male.   HPI  Patient presents today with a complaint of intermittent bilateral flank pain radiating bilateral toward his mid lower abdomen over the course of the last week.  He presents today as pain became unbearable last night and was most intensely noted bilaterally at the flank area.  He has no history of kidney stones or bowel blockages.  He endorses recent constipation and has been taking milk of magnesia and Dulcolax and had a moderate bowel movement which was soft yesterday the symptoms of pain did not resolve with this measure.  Also has been unable to pass gas over the course of the last 2 days.  He is concerned that something is going on with his kidneys as he reports he has had some impaired renal functioning and his PCP had to discontinue some medications to prevent worsening kidney function.  He denies chills, fever, nausea, or vomiting.  Past Medical History:  Diagnosis Date  . Dysrhythmia    PVC'S  . GERD (gastroesophageal reflux disease)   . Hypercholesteremia   . Osteoarthritis     Patient Active Problem List   Diagnosis Date Noted  . Maxillary sinusitis, chronic 06/29/2017  . CRI (chronic renal insufficiency), stage 3 (moderate) ([place]) 04/07/2017  . Pure hyperglyceridemia 04/03/2017  . Essential hypertension 11/07/2016  . Hyperglycemia 04/03/2016  . GAD (generalized anxiety disorder) 10/17/2015  . Erectile dysfunction of organic origin 10/10/2015  . PSA elevation 01/10/2015  . High risk homosexual behavior 12/29/2014  . Routine general medical examination at a health care facility 12/29/2014  . GERD (gastroesophageal reflux disease) 04/23/2011  . IBS (irritable bowel syndrome) 04/23/2011  . Vitamin D deficiency 01/16/2011  . DUPUYTREN'S CONTRACTURE 01/16/2011   . Hypogonadism male 07/25/2010  . IDIOPATHIC OSTEOPOROSIS 11/01/2008  . ANXIETY DEPRESSION 07/26/2008  . Vitamin B 12 deficiency 03/22/2008  . Osteoarthritis 08/25/2007  . Hyperlipidemia with target LDL less than 130 08/03/2007    Past Surgical History:  Procedure Laterality Date  . [place]  . SHOULDER SURGERY     BIL  . TONSILLECTOMY AND ADENOIDECTOMY    . TOTAL HIP ARTHROPLASTY     RT TOAL HIP  . TOTAL HIP ARTHROPLASTY  06/01/2012   Procedure: TOTAL HIP ARTHROPLASTY ANTERIOR APPROACH;  Surgeon: Mauri Pole, MD;  Location: [facility];  Service: Orthopedics;  Laterality: Left;       Home Medications    Prior to Admission medications   Medication Sig Start Date End Date Taking? Authorizing Provider  albuterol (PROVENTIL HFA;VENTOLIN HFA) 108 (90 Base) MCG/ACT inhaler Inhale 1-2 puffs into the lungs every 6 (six) hours as needed for wheezing or shortness of breath. 10/15/18   Zigmund Gottron, NP  ALPRAZolam Duanne Moron) 0.5 MG tablet Take 1 tablet (0.5 mg total) by mouth at bedtime as needed for anxiety. 01/27/19   Janith Lima, MD  Cyanocobalamin (B-12 DOTS) 500 MCG TBDP Take 500 mcg by mouth daily.      [provider]  meloxicam (MOBIC) 15 MG tablet TAKE 1 TABLET BY MOUTH EVERY DAY 10/21/18   Janith Lima, MD  pantoprazole (PROTONIX) 20 MG tablet Take 20 mg by mouth daily.    [provider]  pantoprazole (PROTONIX) 40 MG tablet  TAKE 1 TABLET(40 MG) BY MOUTH DAILY 11/20/18   Janith Lima, MD  ranitidine (ZANTAC) 300 MG tablet Take 300 mg by mouth at bedtime.    [provider]  rosuvastatin (CRESTOR) 10 MG tablet Take 1 tablet (10 mg total) by mouth daily. 08/26/18   Janith Lima, MD  tadalafil (ADCIRCA/CIALIS) 20 MG tablet TAKE 1 TABLET BY MOUTH 1 HOUR PRIOR TO INTERCOURSE; MAXIMUM OF 1 TABLET IN 24 HOURS! 01/26/19   Janith Lima, MD  valACYclovir (VALTREX) 1000 MG tablet Take 0.5 tablets (500 mg total) by mouth daily. 08/26/18    Janith Lima, MD  Vitamin D, Ergocalciferol, (DRISDOL) 1.25 MG (50000 UT) CAPS capsule TAKE ONE CAPSULE BY MOUTH EVERY WEEK 12/25/18   Janith Lima, MD    Family History Family History  Problem Relation Age of Onset  . Emphysema Mother   . Colon cancer Neg Hx   . Esophageal cancer Neg Hx   . Rectal cancer Neg Hx   . Stomach cancer Neg Hx     Social History Social History   Tobacco Use  . Smoking status: Former Smoker    Last attempt to quit: 05/27/1981    Years since quitting: 37.8  . Smokeless tobacco: Never Used  Substance Use Topics  . Alcohol use: Yes    Alcohol/week: 14.0 standard drinks    Types: 14 Shots of liquor per week  . Drug use: No     Allergies   Ezetimibe-simvastatin   Review of Systems Review of Systems Pertinent negatives listed in HPI Physical Exam Triage Vital Signs ED Triage Vitals [04/02/19 1505]  Enc Vitals Group     BP (!) 147/93     Pulse Rate (!) 107     Resp 20     Temp 98.2 F (36.8 C)     Temp src      SpO2 96 %     Weight      Height      Head Circumference      Peak Flow      Pain Score      Pain Loc      Pain Edu?      Excl. in [place]?    No data found.  Updated Vital Signs BP (!) 147/93   Pulse (!) 107   Temp 98.2 F (36.8 C)   Resp 20   SpO2 96%   Visual Acuity Right Eye Distance:   Left Eye Distance:   Bilateral Distance:    Right Eye Near:   Left Eye Near:    Bilateral Near:     Physical Exam General appearance: alert, well developed, well nourished, cooperative and in no distress Head: Normocephalic, without obvious abnormality, atraumatic Respiratory: Respirations even and unlabored, normal respiratory rate Heart: rate and rhythm normal. No gallop or murmurs noted on exam  Abdomen: BS +, no distention, no CVA tenderness, no rebound tenderness, or no mass Extremities: No gross deformities Skin: Skin color, texture, turgor normal. No rashes seen  Psych: Appropriate mood and affect. Neurologic:  Mental status: Alert, oriented to person, place, and time, thought content appropriate.  UC Treatments / Results  Labs (all labs ordered are listed, but only abnormal results are displayed) Labs Reviewed  POCT URINALYSIS DIP (DEVICE)  Unremarkable UA  EKG None  Radiology Dg Abd 1 View  Result Date: 04/02/2019 CLINICAL DATA:  Nephrolithiasis EXAM: ABDOMEN - 1 VIEW COMPARISON:  None. FINDINGS: There is a calcification projecting in the expected  location of the right renal collecting system, measuring 12 mm. No calcification projecting over the expected course of the ureters. Bowel gas pattern is normal. Bilateral total hip arthroplasties. IMPRESSION: Right nephrolithiasis. Electronically Signed   By: Ulyses Jarred M.D.   On: 04/02/2019 15:59    Procedures Procedures (including critical care time)  Medications Ordered in UC Medications - No data to display  Initial Impression / Assessment and Plan / UC Course  I have reviewed the triage vital signs and the nursing notes.  Pertinent labs & imaging results that were available during my care of the patient were reviewed by me and considered in my medical decision making (see chart for details).    Patient was deferred to the ER for CT renal stone study to further evaluation 12 mm renal stone seen on 1 view abdominal image. Patient is without distress and is able to ambulate independently.  Patient agreed with plan.  Final Clinical Impressions(s) / UC Diagnoses   Final diagnoses:  Flank pain, Bilateral  Abdominal bloating   Discharge Instructions   None    ED Prescriptions    None     Controlled Substance Prescriptions [place] Controlled Substance Registry consulted? Not Applicable   Scot Jun, [place] 04/04/19 [phone]

## 2019-04-02 NOTE — ED Triage Notes (Signed)
Back and flank pain, radiates to abdomen, took MOM for constipation

## 2019-04-03 LAB — URINE CULTURE: Culture: NO GROWTH

## 2019-05-05 ENCOUNTER — Other Ambulatory Visit: Payer: Self-pay | Admitting: Internal Medicine

## 2019-05-05 DIAGNOSIS — M159 Polyosteoarthritis, unspecified: Secondary | ICD-10-CM

## 2019-05-14 DIAGNOSIS — Z20828 Contact with and (suspected) exposure to other viral communicable diseases: Secondary | ICD-10-CM | POA: Diagnosis not present

## 2019-05-27 ENCOUNTER — Other Ambulatory Visit: Payer: Self-pay | Admitting: Internal Medicine

## 2019-05-27 DIAGNOSIS — E785 Hyperlipidemia, unspecified: Secondary | ICD-10-CM

## 2019-06-14 ENCOUNTER — Encounter: Payer: Self-pay | Admitting: Internal Medicine

## 2019-06-16 ENCOUNTER — Other Ambulatory Visit: Payer: Self-pay

## 2019-06-16 ENCOUNTER — Encounter: Payer: Self-pay | Admitting: Internal Medicine

## 2019-06-16 ENCOUNTER — Other Ambulatory Visit (INDEPENDENT_AMBULATORY_CARE_PROVIDER_SITE_OTHER): Payer: PPO

## 2019-06-16 ENCOUNTER — Ambulatory Visit (INDEPENDENT_AMBULATORY_CARE_PROVIDER_SITE_OTHER): Payer: PPO | Admitting: Internal Medicine

## 2019-06-16 VITALS — BP 132/70 | HR 67 | Temp 98.4°F | Ht 73.0 in | Wt 201.2 lb

## 2019-06-16 DIAGNOSIS — R739 Hyperglycemia, unspecified: Secondary | ICD-10-CM

## 2019-06-16 DIAGNOSIS — R0609 Other forms of dyspnea: Secondary | ICD-10-CM

## 2019-06-16 DIAGNOSIS — N183 Chronic kidney disease, stage 3 unspecified: Secondary | ICD-10-CM

## 2019-06-16 DIAGNOSIS — K219 Gastro-esophageal reflux disease without esophagitis: Secondary | ICD-10-CM | POA: Diagnosis not present

## 2019-06-16 DIAGNOSIS — E538 Deficiency of other specified B group vitamins: Secondary | ICD-10-CM | POA: Diagnosis not present

## 2019-06-16 DIAGNOSIS — K860 Alcohol-induced chronic pancreatitis: Secondary | ICD-10-CM | POA: Diagnosis not present

## 2019-06-16 DIAGNOSIS — F341 Dysthymic disorder: Secondary | ICD-10-CM | POA: Diagnosis not present

## 2019-06-16 DIAGNOSIS — R748 Abnormal levels of other serum enzymes: Secondary | ICD-10-CM | POA: Diagnosis not present

## 2019-06-16 DIAGNOSIS — R935 Abnormal findings on diagnostic imaging of other abdominal regions, including retroperitoneum: Secondary | ICD-10-CM

## 2019-06-16 DIAGNOSIS — I10 Essential (primary) hypertension: Secondary | ICD-10-CM

## 2019-06-16 DIAGNOSIS — E785 Hyperlipidemia, unspecified: Secondary | ICD-10-CM

## 2019-06-16 DIAGNOSIS — Z7252 High risk homosexual behavior: Secondary | ICD-10-CM

## 2019-06-16 DIAGNOSIS — E559 Vitamin D deficiency, unspecified: Secondary | ICD-10-CM

## 2019-06-16 LAB — LIPID PANEL
Cholesterol: 164 mg/dL (ref 0–200)
HDL: 43 mg/dL (ref >39.00)
LDL Cholesterol: 91 mg/dL (ref 0–99)
NonHDL: 120.58
Total CHOL/HDL Ratio: 4
Triglycerides: 147 mg/dL (ref 0.0–149.0)
VLDL: 29.4 mg/dL (ref 0.0–40.0)

## 2019-06-16 LAB — COMPREHENSIVE METABOLIC PANEL
ALT: 27 U/L (ref 0–53)
AST: 27 U/L (ref 0–37)
Albumin: 4.4 g/dL (ref 3.5–5.2)
Alkaline Phosphatase: 50 U/L (ref 39–117)
BUN: 22 mg/dL (ref 6–23)
CO2: 27 mEq/L (ref 19–32)
Calcium: 9.3 mg/dL (ref 8.4–10.5)
Chloride: 108 mEq/L (ref 96–112)
Creatinine, Ser: 1.1 mg/dL (ref 0.40–1.50)
GFR: 66.48 mL/min (ref >60.00)
Glucose, Bld: 109 mg/dL — ABNORMAL HIGH (ref 70–99)
Potassium: 4.7 mEq/L (ref 3.5–5.1)
Sodium: 142 mEq/L (ref 135–145)
Total Bilirubin: 0.3 mg/dL (ref 0.2–1.2)
Total Protein: 6.8 g/dL (ref 6.0–8.3)

## 2019-06-16 LAB — URINALYSIS, ROUTINE W REFLEX MICROSCOPIC
Bilirubin Urine: NEGATIVE
Hgb urine dipstick: NEGATIVE
Ketones, ur: NEGATIVE
Leukocytes,Ua: NEGATIVE
Nitrite: NEGATIVE
Specific Gravity, Urine: 1.025 (ref 1.000–1.030)
Total Protein, Urine: NEGATIVE
Urine Glucose: NEGATIVE
Urobilinogen, UA: 0.2 (ref 0.0–1.0)
pH: 6 (ref 5.0–8.0)

## 2019-06-16 LAB — TSH: TSH: 1.76 u[IU]/mL (ref 0.35–4.50)

## 2019-06-16 LAB — VITAMIN B12: Vitamin B-12: 705 pg/mL (ref 211–911)

## 2019-06-16 LAB — LIPASE: Lipase: 127 U/L — ABNORMAL HIGH (ref 11.0–59.0)

## 2019-06-16 LAB — AMYLASE: Amylase: 48 U/L (ref 27–131)

## 2019-06-16 LAB — VITAMIN D 25 HYDROXY (VIT D DEFICIENCY, FRACTURES): VITD: 54.46 ng/mL (ref 30.00–100.00)

## 2019-06-16 LAB — FOLATE: Folate: 23.6 ng/mL (ref >5.9)

## 2019-06-16 MED ORDER — PANTOPRAZOLE SODIUM 20 MG PO TBEC: 20.0000 mg | DELAYED_RELEASE_TABLET | Freq: Every day | ORAL | 1 refills | Status: DC

## 2019-06-16 NOTE — Progress Notes (Signed)
Subjective:  Patient ID: Jonathan Young, male    DOB: Apr 12, 1951  Age: 68 y.o. MRN: [id number]  CC: Hypertension and Hyperlipidemia   HPI Jonathan Young presents for f/up - He was seen elsewhere about 2 months ago for a vague back, flank, abdominal pain.  He underwent a CT scan that showed evidence of pancreatitis.  TH CT scan also discovered of benign renal lesion.  He thinks the pancreatitis was related to alcohol intake.  Is curtailed his drinking and denies any recent episodes of abdominal pain, weight loss, loss of appetite, nausea, or vomiting.  He saw his urologist a few months ago and his PSA is being monitored.  He tells me his digital rectal examination was done at that time.  He continues to consider his sexual practices risky and wants to restart PrEP.  He complains that over the last year he has developed doing he has developed worsening dyspnea on exertion.  Outpatient Medications Prior to Visit  Medication Sig Dispense Refill   ALPRAZolam (XANAX) 0.5 MG tablet Take 1 tablet (0.5 mg total) by mouth at bedtime as needed for anxiety. 30 tablet 1   b complex vitamins tablet Take 1 tablet by mouth daily.     meloxicam (MOBIC) 15 MG tablet Take 1 tablet (15 mg total) by mouth daily. 90 tablet 0   rosuvastatin (CRESTOR) 10 MG tablet TAKE 1 TABLET BY MOUTH ONCE DAILY 90 tablet 0   tadalafil (ADCIRCA/CIALIS) 20 MG tablet TAKE 1 TABLET BY MOUTH 1 HOUR PRIOR TO INTERCOURSE; MAXIMUM OF 1 TABLET IN 24 HOURS! (Patient taking differently: Take 20 mg by mouth See admin instructions. Take 20 mg by mouth one hour prior to intercourse- maximum of 20 mg (1 tablet) in 24 hours) 10 tablet 5   valACYclovir (VALTREX) 1000 MG tablet Take 0.5 tablets (500 mg total) by mouth daily. 30 tablet 0   vitamin C (ASCORBIC ACID) 500 MG tablet Take 500 mg by mouth daily.     Vitamin D, Ergocalciferol, (DRISDOL) 1.25 MG (50000 UT) CAPS capsule TAKE ONE CAPSULE BY MOUTH EVERY WEEK (Patient taking differently:  Take 50,000 Units by mouth every Saturday. ) 12 capsule 1   pantoprazole (PROTONIX) 40 MG tablet TAKE 1 TABLET(40 MG) BY MOUTH DAILY (Patient taking differently: Take 40 mg by mouth daily before breakfast. ) 90 tablet 1   albuterol (PROVENTIL HFA;VENTOLIN HFA) 108 (90 Base) MCG/ACT inhaler Inhale 1-2 puffs into the lungs every 6 (six) hours as needed for wheezing or shortness of breath. (Patient not taking: Reported on 04/02/2019) 1 Inhaler 0   cyclobenzaprine (FLEXERIL) 5 MG tablet Take 5 mg by mouth every 8 (eight) hours as needed for muscle spasms.      No facility-administered medications prior to visit.     ROS Review of Systems  Constitutional: Negative.  Negative for appetite change, diaphoresis, fatigue and unexpected weight change.  HENT: Negative.  Negative for sore throat and trouble swallowing.   Eyes: Negative for visual disturbance.  Respiratory: Positive for shortness of breath. Negative for cough, chest tightness and wheezing.   Cardiovascular: Negative for chest pain, palpitations and leg swelling.  Gastrointestinal: Negative.  Negative for abdominal pain, constipation, diarrhea, nausea and vomiting.  Endocrine: Negative.   Genitourinary: Negative.  Negative for difficulty urinating, discharge, genital sores, hematuria, penile pain and urgency.  Musculoskeletal: Positive for arthralgias. Negative for back pain and myalgias.  Skin: Negative.  Negative for color change.  Neurological: Negative for dizziness, weakness, light-headedness and numbness.  Hematological: Negative for adenopathy. Does not bruise/bleed easily.  Psychiatric/Behavioral: Negative.     Objective:  BP 132/70 (BP Location: Left Arm, Patient Position: Sitting, Cuff Size: Normal)    Pulse 67    Temp 98.4 F (36.9 C) (Oral)    Ht 6\' 1"  (1.854 m)    Wt 201 lb 4 oz (91.3 kg)    SpO2 97%    BMI 26.55 kg/m   BP Readings from Last 3 Encounters:  06/16/19 132/70  04/02/19 (!) 141/98  04/02/19 (!) 147/93     Wt Readings from Last 3 Encounters:  06/16/19 201 lb 4 oz (91.3 kg)  08/26/18 200 lb (90.7 kg)  07/14/18 199 lb (90.3 kg)    Physical Exam Vitals signs reviewed.  Constitutional:      Appearance: He is not ill-appearing or diaphoretic.  HENT:     Nose: Nose normal.     Mouth/Throat:     Mouth: Mucous membranes are moist.  Eyes:     General: No scleral icterus.    Conjunctiva/sclera: Conjunctivae normal.  Neck:     Musculoskeletal: Normal range of motion. No neck rigidity or muscular tenderness.  Cardiovascular:     Rate and Rhythm: Regular rhythm. Bradycardia present.     Pulses: Normal pulses.     Heart sounds: No murmur. No gallop.      Comments: EKG -  Sinus  Bradycardia  WITHIN NORMAL LIMITS  Pulmonary:     Effort: Pulmonary effort is normal.     Breath sounds: No stridor. No wheezing, rhonchi or rales.  Abdominal:     General: Abdomen is flat. Bowel sounds are normal. There is no distension.     Palpations: There is no hepatomegaly or splenomegaly.     Tenderness: There is no abdominal tenderness.     Hernia: No hernia is present.  Musculoskeletal: Normal range of motion.     Right lower leg: No edema.     Left lower leg: No edema.  Lymphadenopathy:     Cervical: No cervical adenopathy.  Skin:    General: Skin is warm and dry.     Coloration: Skin is not pale.  Neurological:     General: No focal deficit present.     Mental Status: He is oriented to person, place, and time.  Psychiatric:        Mood and Affect: Mood normal.        Behavior: Behavior normal.     Lab Results  Component Value Date   WBC 8.2 04/02/2019   HGB 14.1 04/02/2019   HCT 43.1 04/02/2019   PLT 211 04/02/2019   GLUCOSE 109 (H) 06/16/2019   CHOL 164 06/16/2019   TRIG 147.0 06/16/2019   HDL 43.00 06/16/2019   LDLDIRECT 87.0 04/03/2016   LDLCALC 91 06/16/2019   ALT 27 06/16/2019   AST 27 06/16/2019   NA 142 06/16/2019   K 4.7 06/16/2019   CL 108 06/16/2019   CREATININE  1.10 06/16/2019   BUN 22 06/16/2019   CO2 27 06/16/2019   TSH 1.76 06/16/2019   PSA 6.29 02/18/2019   INR 0.94 05/27/2012   HGBA1C 5.8 04/03/2016    Dg Abd 1 View  Result Date: 04/02/2019 CLINICAL DATA:  Nephrolithiasis EXAM: ABDOMEN - 1 VIEW COMPARISON:  None. FINDINGS: There is a calcification projecting in the expected location of the right renal collecting system, measuring 12 mm. No calcification projecting over the expected course of the ureters. Bowel gas pattern is normal. Bilateral  total hip arthroplasties. IMPRESSION: Right nephrolithiasis. Electronically Signed   By: Ulyses Jarred M.D.   On: 04/02/2019 15:59   Ct Renal Stone Study  Result Date: 04/02/2019 CLINICAL DATA:  RIGHT flank pain. Kidney stone. EXAM: CT ABDOMEN AND PELVIS WITHOUT CONTRAST TECHNIQUE: Multidetector CT imaging of the abdomen and pelvis was performed following the standard protocol without IV contrast. COMPARISON:  Plain film of the abdomen from earlier same day. FINDINGS: Lower chest: No acute abnormality. Hepatobiliary: No focal liver abnormality is seen. No gallstones, gallbladder wall thickening, or biliary dilatation. Pancreas: Questionable subtle fluid stranding about the pancreatic head. Pancreatic body and tail appear normal. Spleen: Normal in size without focal abnormality. Adrenals/Urinary Tract: Both kidneys are unremarkable without mass, stone or hydronephrosis. No perinephric fluid. No ureteral calculi identified. Bladder is mostly obscured by metallic artifact emanating from patient's bilateral hip prostheses. Stomach/Bowel: Bowel is normal in caliber. No secondary signs of bowel wall inflammation. Appendix is normal. Stomach is unremarkable. Vascular/Lymphatic: Mild aortic atherosclerosis. No enlarged lymph nodes seen. Reproductive: Unremarkable, partially obscured by the metallic artifact from patient's bilateral hip prostheses. Other: No abscess collection seen. No free intraperitoneal air.  Musculoskeletal: The suspected RIGHT renal stone demonstrated on plain film from earlier today is actually a benign appearing soft tissue calcification along the under surface of the RIGHT hemidiaphragm posterior to the upper pole of the RIGHT kidney. Degenerative spondylosis at the L4-5 and L5-S1 levels, moderate in degree. No acute appearing osseous abnormality. IMPRESSION: 1. Questionable subtle fluid stranding about the pancreatic head, suspicious for mild acute pancreatitis. Recommend correlation with pancreatic lab values. 2. No renal or ureteral calculi. No hydronephrosis. The questioned RIGHT renal stone on today's earlier plain film examination corresponds to a benign appearing soft tissue calcification posterior to the RIGHT kidney. 3. Additional chronic/incidental findings detailed above. Aortic Atherosclerosis (ICD10-I70.0). Electronically Signed   By: Franki Cabot M.D.   On: 04/02/2019 19:07    Assessment & Plan:   Aadarsh was seen today for hypertension and hyperlipidemia.  Diagnoses and all orders for this visit:  Essential hypertension- His blood pressure is adequately well controlled. -     Comprehensive metabolic panel; Future -     Urinalysis, Routine w reflex microscopic; Future -     EKG 12-Lead  CRI (chronic renal insufficiency), stage 3 (moderate) (HCC)- His renal function has improved. -     Urinalysis, Routine w reflex microscopic; Future  Vitamin B 12 deficiency -     Vitamin B12; Future -     Folate; Future  Hyperlipidemia with target LDL less than 130- He has achieved his LDL goal and is doing well on the statin. -     Lipid panel; Future -     Comprehensive metabolic panel; Future -     TSH; Future  Vitamin D deficiency -     VITAMIN D 25 Hydroxy (Vit-D Deficiency, Fractures); Future  Hyperglycemia  ANXIETY DEPRESSION  High risk homosexual behavior- His renal function is normal now.  If his testing for HIV and hep B are negative then will restart  [place]. -     Hepatitis B surface antigen; Future -     HIV Antibody (routine testing w rflx); Future  Gastroesophageal reflux disease without esophagitis- He wants to lower the Protonix dose to 20 mg a day. -     pantoprazole (PROTONIX) 20 MG tablet; Take 1 tablet (20 mg total) by mouth daily.  DOE (dyspnea on exertion)- I have asked him to undergo an  exercise tolerance test to screen for CAD. -     EKG 12-Lead -     EXERCISE TOLERANCE TEST (ETT); Future  Alcohol-induced chronic pancreatitis (HCC) -     Lipase; Future -     Amylase; Future -     MR Abdomen W Wo Contrast; Future  Elevated lipase - His lipase remains mildly elevated and the recent CT scan showed an abnormality in the pancreatic head.  I have asked him to undergo an MRI of the pancreas to see if there is of persistent pancreatitis or a pancreatic tumor. -     MR Abdomen W Wo Contrast; Future  Abnormal x-ray of abdomen -     MR Abdomen W Wo Contrast; Future   I have discontinued Jadarion P. Lazalde's albuterol, pantoprazole, and cyclobenzaprine. I am also having him start on pantoprazole. Additionally, I am having him maintain his valACYclovir, Vitamin D (Ergocalciferol), tadalafil, ALPRAZolam, b complex vitamins, vitamin C, meloxicam, and rosuvastatin.  Meds ordered this encounter  Medications   pantoprazole (PROTONIX) 20 MG tablet    Sig: Take 1 tablet (20 mg total) by mouth daily.    Dispense:  90 tablet    Refill:  1     Follow-up: Return in about 3 months (around 09/16/2019).  Scarlette Calico, MD

## 2019-06-16 NOTE — Patient Instructions (Signed)

## 2019-06-17 ENCOUNTER — Encounter: Payer: Self-pay | Admitting: Internal Medicine

## 2019-06-17 ENCOUNTER — Other Ambulatory Visit: Payer: Self-pay | Admitting: Internal Medicine

## 2019-06-17 DIAGNOSIS — Z7252 High risk homosexual behavior: Secondary | ICD-10-CM

## 2019-06-17 LAB — HEPATITIS B SURFACE ANTIGEN: Hepatitis B Surface Ag: NONREACTIVE

## 2019-06-17 LAB — HIV ANTIBODY (ROUTINE TESTING W REFLEX): HIV 1&2 Ab, 4th Generation: NONREACTIVE

## 2019-06-17 MED ORDER — TRUVADA 200-300 MG PO TABS: 1.0000 | ORAL_TABLET | Freq: Every day | ORAL | 1 refills | Status: DC

## 2019-06-18 ENCOUNTER — Other Ambulatory Visit: Payer: Self-pay | Admitting: Internal Medicine

## 2019-06-18 DIAGNOSIS — Z7252 High risk homosexual behavior: Secondary | ICD-10-CM

## 2019-06-18 MED ORDER — DESCOVY 200-25 MG PO TABS: 1.0000 | ORAL_TABLET | Freq: Every day | ORAL | 1 refills | Status: DC

## 2019-06-24 ENCOUNTER — Encounter: Payer: Self-pay | Admitting: Internal Medicine

## 2019-06-24 ENCOUNTER — Ambulatory Visit
Admission: RE | Admit: 2019-06-24 | Discharge: 2019-06-24 | Disposition: A | Discharge status: Home / Self Care | Payer: PPO | Source: Ambulatory Visit | Attending: Internal Medicine | Admitting: Internal Medicine

## 2019-06-24 ENCOUNTER — Other Ambulatory Visit: Payer: Self-pay

## 2019-06-24 DIAGNOSIS — D1809 Hemangioma of other sites: Secondary | ICD-10-CM | POA: Diagnosis not present

## 2019-06-24 DIAGNOSIS — K860 Alcohol-induced chronic pancreatitis: Secondary | ICD-10-CM

## 2019-06-24 DIAGNOSIS — R748 Abnormal levels of other serum enzymes: Secondary | ICD-10-CM

## 2019-06-24 DIAGNOSIS — R935 Abnormal findings on diagnostic imaging of other abdominal regions, including retroperitoneum: Secondary | ICD-10-CM

## 2019-06-24 MED ORDER — GADOBENATE DIMEGLUMINE 529 MG/ML IV SOLN
20.0000 mL | Freq: Once | INTRAVENOUS | Status: AC | PRN
  Administered 2019-06-24: 20 mL via INTRAVENOUS

## 2019-06-29 ENCOUNTER — Telehealth: Payer: Self-pay | Admitting: Internal Medicine

## 2019-06-29 NOTE — Telephone Encounter (Signed)
Al with [facility] calling about PA on script. Use option 3 when calling and [id number]#[phone] to assist with PA on emtricitabine-tenofovir AF (DESCOVY) 200-25 MG tablet.

## 2019-06-29 NOTE — Telephone Encounter (Signed)
Key: [id number]

## 2019-06-29 NOTE — Telephone Encounter (Signed)
PA is not required per Cover My Meds.

## 2019-06-30 NOTE — Telephone Encounter (Signed)
lvm for pt to call back.  

## 2019-07-05 ENCOUNTER — Other Ambulatory Visit: Payer: Self-pay | Admitting: Internal Medicine

## 2019-07-05 DIAGNOSIS — E559 Vitamin D deficiency, unspecified: Secondary | ICD-10-CM

## 2019-07-05 MED ORDER — VITAMIN D (ERGOCALCIFEROL) 1.25 MG (50000 UNIT) PO CAPS: 50000.0000 [IU] | ORAL_CAPSULE | ORAL | 1 refills | Status: DC

## 2019-07-27 ENCOUNTER — Other Ambulatory Visit: Payer: Self-pay | Admitting: Internal Medicine

## 2019-07-27 DIAGNOSIS — M159 Polyosteoarthritis, unspecified: Secondary | ICD-10-CM

## 2019-07-27 MED ORDER — MELOXICAM 15 MG PO TABS: 15.0000 mg | ORAL_TABLET | Freq: Every day | ORAL | 0 refills | Status: DC

## 2019-08-03 DIAGNOSIS — H524 Presbyopia: Secondary | ICD-10-CM | POA: Diagnosis not present

## 2019-08-09 ENCOUNTER — Encounter: Payer: Self-pay | Admitting: Internal Medicine

## 2019-08-19 DIAGNOSIS — R972 Elevated prostate specific antigen [PSA]: Secondary | ICD-10-CM | POA: Diagnosis not present

## 2019-08-20 LAB — PSA: PSA: 6.08

## 2019-08-24 NOTE — Telephone Encounter (Signed)
I would not want to scheduled the patient for a flu shot until COVID results come back.

## 2019-08-25 ENCOUNTER — Other Ambulatory Visit: Payer: Self-pay

## 2019-08-25 ENCOUNTER — Ambulatory Visit (INDEPENDENT_AMBULATORY_CARE_PROVIDER_SITE_OTHER): Payer: PPO

## 2019-08-25 DIAGNOSIS — Z23 Encounter for immunization: Secondary | ICD-10-CM | POA: Diagnosis not present

## 2019-08-25 NOTE — Telephone Encounter (Signed)
I called [facility] earlier this morning and they will contact pt

## 2019-08-27 DIAGNOSIS — R972 Elevated prostate specific antigen [PSA]: Secondary | ICD-10-CM | POA: Diagnosis not present

## 2019-08-27 DIAGNOSIS — R3912 Poor urinary stream: Secondary | ICD-10-CM | POA: Diagnosis not present

## 2019-08-27 DIAGNOSIS — N401 Enlarged prostate with lower urinary tract symptoms: Secondary | ICD-10-CM | POA: Diagnosis not present

## 2019-09-22 ENCOUNTER — Encounter: Payer: Self-pay | Admitting: Internal Medicine

## 2019-09-23 ENCOUNTER — Encounter: Payer: Self-pay | Admitting: Internal Medicine

## 2019-09-23 ENCOUNTER — Other Ambulatory Visit: Payer: Self-pay

## 2019-09-23 ENCOUNTER — Ambulatory Visit (INDEPENDENT_AMBULATORY_CARE_PROVIDER_SITE_OTHER): Payer: PPO | Admitting: Internal Medicine

## 2019-09-23 VITALS — BP 138/84 | HR 71 | Temp 97.9°F | Resp 16 | Ht 73.0 in | Wt 201.5 lb

## 2019-09-23 DIAGNOSIS — M79609 Pain in unspecified limb: Secondary | ICD-10-CM

## 2019-09-23 DIAGNOSIS — R0609 Other forms of dyspnea: Secondary | ICD-10-CM

## 2019-09-23 DIAGNOSIS — L089 Local infection of the skin and subcutaneous tissue, unspecified: Secondary | ICD-10-CM | POA: Diagnosis not present

## 2019-09-23 DIAGNOSIS — S8991XA Unspecified injury of right lower leg, initial encounter: Secondary | ICD-10-CM

## 2019-09-23 DIAGNOSIS — R0600 Dyspnea, unspecified: Secondary | ICD-10-CM

## 2019-09-23 DIAGNOSIS — B351 Tinea unguium: Secondary | ICD-10-CM | POA: Diagnosis not present

## 2019-09-23 DIAGNOSIS — T148XXD Other injury of unspecified body region, subsequent encounter: Secondary | ICD-10-CM | POA: Insufficient documentation

## 2019-09-23 MED ORDER — CEFDINIR 300 MG PO CAPS: 300.0000 mg | ORAL_CAPSULE | Freq: Two times a day (BID) | ORAL | 0 refills | Status: AC

## 2019-09-23 MED ORDER — FLUCONAZOLE 150 MG PO TABS: 300.0000 mg | ORAL_TABLET | ORAL | 0 refills | Status: AC

## 2019-09-23 NOTE — Progress Notes (Signed)
Subjective:  Patient ID: Jonathan Young, male    DOB: June 16, 1951  Age: 68 y.o. MRN: [id number]  CC: Wound Infection   HPI [name] presents for f/up -  1.  He tells me that he was at the gym about a week ago when he sustained an injury to the anterior aspect of the right lower extremity.  He is concerned that the wound is not healing and he has worsening pain and redness.  He says the area is not bleeding and there is no discharge. 2.  He complains of a deformed fingernail on his left ring finger.  This is been worsening over the last few months. 2.  He continues to complained of dyspnea on exertion.  He wants to undergo a cardiac calcium score.  He is scheduled for an ETT in the next few weeks.  Outpatient Medications Prior to Visit  Medication Sig Dispense Refill  . ALPRAZolam (XANAX) 0.5 MG tablet Take 1 tablet (0.5 mg total) by mouth at bedtime as needed for anxiety. 30 tablet 1  . b complex vitamins tablet Take 1 tablet by mouth daily.    Marland Kitchen emtricitabine-tenofovir AF (DESCOVY) 200-25 MG tablet Take 1 tablet by mouth daily. 90 tablet 1  . meloxicam (MOBIC) 15 MG tablet Take 1 tablet (15 mg total) by mouth daily. 90 tablet 0  . pantoprazole (PROTONIX) 20 MG tablet Take 1 tablet (20 mg total) by mouth daily. 90 tablet 1  . rosuvastatin (CRESTOR) 10 MG tablet TAKE 1 TABLET BY MOUTH ONCE DAILY 90 tablet 0  . tadalafil (ADCIRCA/CIALIS) 20 MG tablet TAKE 1 TABLET BY MOUTH 1 HOUR PRIOR TO INTERCOURSE; MAXIMUM OF 1 TABLET IN 24 HOURS! (Patient taking differently: Take 20 mg by mouth See admin instructions. Take 20 mg by mouth one hour prior to intercourse- maximum of 20 mg (1 tablet) in 24 hours) 10 tablet 5  . valACYclovir (VALTREX) 1000 MG tablet Take 0.5 tablets (500 mg total) by mouth daily. 30 tablet 0  . vitamin C (ASCORBIC ACID) 500 MG tablet Take 500 mg by mouth daily.    . Vitamin D, Ergocalciferol, (DRISDOL) 1.25 MG (50000 UT) CAPS capsule Take 1 capsule (50,000 Units  total) by mouth every Saturday. 90 capsule 1   No facility-administered medications prior to visit.     ROS Review of Systems  Constitutional: Negative for chills, fatigue and fever.  Respiratory: Positive for shortness of breath. Negative for cough, chest tightness and wheezing.   Cardiovascular: Negative for chest pain, palpitations and leg swelling.  Gastrointestinal: Negative for abdominal pain, constipation, diarrhea, nausea and vomiting.  Endocrine: Negative.   Genitourinary: Negative.  Negative for difficulty urinating.  Musculoskeletal: Negative for arthralgias and myalgias.  Skin: Positive for wound. Negative for color change.  Neurological: Negative for dizziness, weakness, light-headedness and headaches.  Hematological: Negative for adenopathy. Does not bruise/bleed easily.  Psychiatric/Behavioral: Negative.     Objective:  BP 138/84 (BP Location: Left Arm, Patient Position: Sitting, Cuff Size: Normal)   Pulse 71   Temp 97.9 F (36.6 C) (Oral)   Resp 16   Ht 6\' 1"  (1.854 m)   Wt 201 lb 8 oz (91.4 kg)   SpO2 97%   BMI 26.58 kg/m   BP Readings from Last 3 Encounters:  09/23/19 138/84  06/16/19 132/70  04/02/19 (!) 141/98    Wt Readings from Last 3 Encounters:  09/23/19 201 lb 8 oz (91.4 kg)  06/16/19 201 lb 4  oz (91.3 kg)  08/26/18 200 lb (90.7 kg)    Physical Exam Vitals signs reviewed.  Constitutional:      Appearance: Normal appearance.  HENT:     Nose: Nose normal.     Mouth/Throat:     Mouth: Mucous membranes are moist.     Pharynx: No oropharyngeal exudate.  Eyes:     General: No scleral icterus.    Conjunctiva/sclera: Conjunctivae normal.  Neck:     Musculoskeletal: Normal range of motion and neck supple.  Cardiovascular:     Rate and Rhythm: Normal rate and regular rhythm.     Heart sounds: No murmur.  Pulmonary:     Effort: Pulmonary effort is normal.     Breath sounds: No stridor. No wheezing, rhonchi or rales.  Abdominal:      General: Abdomen is flat. Bowel sounds are normal. There is no distension.     Palpations: There is no hepatomegaly or splenomegaly.     Tenderness: There is no abdominal tenderness.  Musculoskeletal: Normal range of motion.     Right lower leg: No edema.     Left lower leg: No edema.       Legs:     Comments: Left ring finger.  Proximal aspect of the nailbed is deformed and there is some subungual debris.  See photo.  Lymphadenopathy:     Cervical: No cervical adenopathy.  Skin:    General: Skin is warm and dry.  Neurological:     General: No focal deficit present.     Mental Status: He is alert.     Lab Results  Component Value Date   WBC 8.2 04/02/2019   HGB 14.1 04/02/2019   HCT 43.1 04/02/2019   PLT 211 04/02/2019   GLUCOSE 109 (H) 06/16/2019   CHOL 164 06/16/2019   TRIG 147.0 06/16/2019   HDL 43.00 06/16/2019   LDLDIRECT 87.0 04/03/2016   LDLCALC 91 06/16/2019   ALT 27 06/16/2019   AST 27 06/16/2019   NA 142 06/16/2019   K 4.7 06/16/2019   CL 108 06/16/2019   CREATININE 1.10 06/16/2019   BUN 22 06/16/2019   CO2 27 06/16/2019   TSH 1.76 06/16/2019   PSA 6.08 08/20/2019   INR 0.94 05/27/2012   HGBA1C 5.8 04/03/2016    Mr Abdomen W Wo Contrast  Result Date: 06/24/2019 CLINICAL DATA:  68 year old male with history of abdominal pain for the past 3-4 months. Possible pancreatitis. EXAM: MRI ABDOMEN WITHOUT AND WITH CONTRAST TECHNIQUE: Multiplanar multisequence MR imaging of the abdomen was performed both before and after the administration of intravenous contrast. CONTRAST:  81mL MULTIHANCE GADOBENATE DIMEGLUMINE 529 MG/ML IV SOLN COMPARISON:  No prior abdominal MRI. CT the abdomen and pelvis 04/02/2019. FINDINGS: Lower chest: Unremarkable. Hepatobiliary: Diffuse low signal intensity throughout the hepatic parenchyma on out of phase dual echo images, compatible with hepatic steatosis. In segment 6 of the liver (axial image 17 of series 14) there is a 9 mm T1 hypointense,  T2 hyperintense lesion which demonstrates early peripheral hyperenhancement with progressive centripetal filling, compatible with a small cavernous hemangioma. No other suspicious cystic or solid hepatic lesions. No intra or extrahepatic biliary ductal dilatation noted on MRCP images. Common bile duct measures 4 mm in the porta hepatis. Gallbladder is normal in appearance. Pancreas: No pancreatic mass. No pancreatic ductal dilatation. No pancreatic or peripancreatic fluid or inflammatory changes. Spleen:  Unremarkable. Adrenals/Urinary Tract: Mild scarring in the lower pole of the left kidney. Right kidney and bilateral  adrenal glands are normal in appearance. No hydroureteronephrosis in the visualized portions of the abdomen. Stomach/Bowel: Visualized portions are unremarkable. Vascular/Lymphatic: No aneurysm identified in the visualized abdominal vasculature. No lymphadenopathy noted in the abdomen. Other: No significant volume of ascites noted in the visualized portions of the abdomen. Musculoskeletal: No aggressive appearing osseous lesions are noted in the visualized portions of the skeleton. IMPRESSION: 1. No acute findings are noted in the abdomen to account for the patient's symptoms. Specifically, no findings to suggest an acute pancreatitis at this time. 2. Tiny cavernous hemangioma in segment 6 of the liver incidentally noted. Electronically Signed   By: Vinnie Langton M.D.   On: 06/24/2019 09:27    Assessment & Plan:   Adonijah was seen today for wound infection.  Diagnoses and all orders for this visit:  Wound infection, posttraumatic -     cefdinir (OMNICEF) 300 MG capsule; Take 1 capsule (300 mg total) by mouth 2 (two) times daily for 10 days.  Pain due to onychomycosis of nail -     fluconazole (DIFLUCAN) 150 MG tablet; Take 2 tablets (300 mg total) by mouth once a week.  DOE (dyspnea on exertion) -     CT CARDIAC SCORING; Future   I am having Shaylon P. Guida start on fluconazole  and cefdinir. I am also having him maintain his valACYclovir, tadalafil, ALPRAZolam, b complex vitamins, vitamin C, rosuvastatin, pantoprazole, Descovy, Vitamin D (Ergocalciferol), and meloxicam.  Meds ordered this encounter  Medications  . fluconazole (DIFLUCAN) 150 MG tablet    Sig: Take 2 tablets (300 mg total) by mouth once a week.    Dispense:  32 tablet    Refill:  0  . cefdinir (OMNICEF) 300 MG capsule    Sig: Take 1 capsule (300 mg total) by mouth 2 (two) times daily for 10 days.    Dispense:  20 capsule    Refill:  0     Follow-up: Return in about 3 weeks (around 10/14/2019).  Scarlette Calico, MD

## 2019-09-23 NOTE — Patient Instructions (Signed)
Wound Infection A wound infection happens when tiny organisms (microorganisms) start to grow in a wound. A wound infection is most often caused by bacteria. Infection can cause the wound to break open or worsen. Wound infection needs treatment. If a wound infection is left untreated, complications can occur. Untreated wound infections may lead to an infection in the bloodstream (septicemia) or a bone infection (osteomyelitis). What are the causes? This condition is most often caused by bacteria growing in a wound. Other microorganisms, like yeast and fungi, can also cause wound infections. What increases the risk? The following factors may make you more likely to develop this condition:  Having a weak body defense system (immune system).  Having diabetes.  Taking steroid medicines for a long time (chronic use).  Smoking.  Being an older person.  Being overweight.  Taking chemotherapy medicines. What are the signs or symptoms? Symptoms of this condition include:  Having more redness, swelling, or pain at the wound site.  Having more blood or fluid at the wound site.  A bad smell coming from a wound or bandage (dressing).  Having a fever.  Feeling tired or fatigued.  Having warmth at or around the wound.  Having pus at the wound site. How is this diagnosed? This condition is diagnosed with a medical history and physical exam. You may also have a wound culture or blood tests or both. How is this treated? This condition is usually treated with an antibiotic medicine.  The infection should improve 24-48 hours after you start antibiotics.  After 24-48 hours, redness around the wound should stop spreading, and the wound should be less painful. Follow these instructions at home: Medicines  Take or apply over-the-counter and prescription medicines only as told by your health care provider.  If you were prescribed an antibiotic medicine, take or apply it as told by your health  care provider. Do not stop using the antibiotic even if you start to feel better. Wound care   Clean the wound each day, or as told by your health care provider. ? Wash the wound with mild soap and water. ? Rinse the wound with water to remove all soap. ? Pat the wound dry with a clean towel. Do not rub it.  Follow instructions from your health care provider about how to take care of your wound. Make sure you: ? Wash your hands with soap and water before and after you change your dressing. If soap and water are not available, use hand sanitizer. ? Change your dressing as told by your health care provider. ? Leave stitches (sutures), skin glue, or adhesive strips in place if your wound has been closed. These skin closures may need to stay in place for 2 weeks or longer. If adhesive strip edges start to loosen and curl up, you may trim the loose edges. Do not remove adhesive strips completely unless your health care provider tells you to do that. Some wounds are left open to heal on their own.  Check your wound every day for signs of infection. Watch for: ? More redness, swelling, or pain. ? More fluid or blood. ? Warmth. ? Pus or a bad smell. General instructions  Keep the dressing dry until your health care provider says it can be removed.  Do not take baths, swim, or use a hot tub until your health care provider approves. Ask your health care provider if you may take showers. You may only be allowed to take sponge baths.  Raise (elevate)  the injured area above the level of your heart while you are sitting or lying down.  Do not scratch or pick at the wound.  Keep all follow-up visits as told by your health care provider. This is important. Contact a health care provider if:  Your pain is not controlled with medicine.  You have more redness, swelling, or pain around your wound.  You have more fluid or blood coming from your wound.  Your wound feels warm to the touch.  You have  pus coming from your wound.  You continue to notice a bad smell coming from your wound or your dressing.  Your wound that was closed breaks open. Get help right away if:  You have a red streak going away from your wound.  You have a fever. Summary  A wound infection happens when tiny organisms (microorganisms) start to grow in a wound.  This condition is usually treated with an antibiotic medicine.  Follow instructions from your health care provider about how to take care of your wound.  Contact a health care provider if your wound infection does not begin to improve in 24-48 hours, or your symptoms worsen.  Keep all follow-up visits as told by your health care provider. This is important. This information is not intended to replace advice given to you by your health care provider. Make sure you discuss any questions you have with your health care provider. Document Released: 09/07/2003 Document Revised: 07/21/2018 Document Reviewed: 07/21/2018 Elsevier Patient Education  2020 [employer] Inc[employer]  

## 2019-10-06 ENCOUNTER — Encounter: Payer: Self-pay | Admitting: Internal Medicine

## 2019-10-06 ENCOUNTER — Other Ambulatory Visit: Payer: Self-pay | Admitting: Internal Medicine

## 2019-10-06 DIAGNOSIS — F411 Generalized anxiety disorder: Secondary | ICD-10-CM

## 2019-10-06 DIAGNOSIS — T148XXD Other injury of unspecified body region, subsequent encounter: Secondary | ICD-10-CM

## 2019-10-06 MED ORDER — ALPRAZOLAM 0.5 MG PO TABS: 0.5000 mg | ORAL_TABLET | Freq: Every evening | ORAL | 1 refills | Status: DC | PRN

## 2019-10-08 DIAGNOSIS — N401 Enlarged prostate with lower urinary tract symptoms: Secondary | ICD-10-CM | POA: Diagnosis not present

## 2019-10-08 DIAGNOSIS — R3915 Urgency of urination: Secondary | ICD-10-CM | POA: Diagnosis not present

## 2019-10-15 ENCOUNTER — Telehealth (HOSPITAL_COMMUNITY): Payer: Self-pay

## 2019-10-15 NOTE — Telephone Encounter (Signed)
Encounter complete. 

## 2019-10-16 ENCOUNTER — Other Ambulatory Visit (HOSPITAL_COMMUNITY)
Admission: RE | Admit: 2019-10-16 | Discharge: 2019-10-16 | Disposition: A | Discharge status: Home / Self Care | Payer: PPO | Source: Ambulatory Visit | Attending: Internal Medicine | Admitting: Internal Medicine

## 2019-10-16 DIAGNOSIS — Z01812 Encounter for preprocedural laboratory examination: Secondary | ICD-10-CM | POA: Diagnosis not present

## 2019-10-16 DIAGNOSIS — Z20828 Contact with and (suspected) exposure to other viral communicable diseases: Secondary | ICD-10-CM | POA: Diagnosis not present

## 2019-10-17 ENCOUNTER — Encounter: Payer: Self-pay | Admitting: Internal Medicine

## 2019-10-17 LAB — NOVEL CORONAVIRUS, NAA (HOSP ORDER, SEND-OUT TO REF LAB; TAT 18-24 HRS): SARS-CoV-2, NAA: NOT DETECTED

## 2019-10-20 ENCOUNTER — Ambulatory Visit (HOSPITAL_COMMUNITY)
Admission: RE | Admit: 2019-10-20 | Discharge: 2019-10-20 | Disposition: A | Discharge status: Home / Self Care | Payer: PPO | Source: Ambulatory Visit | Attending: Cardiology | Admitting: Cardiology

## 2019-10-20 ENCOUNTER — Other Ambulatory Visit: Payer: Self-pay

## 2019-10-20 DIAGNOSIS — R0600 Dyspnea, unspecified: Secondary | ICD-10-CM | POA: Diagnosis not present

## 2019-10-20 DIAGNOSIS — R0609 Other forms of dyspnea: Secondary | ICD-10-CM

## 2019-10-20 LAB — EXERCISE TOLERANCE TEST
Estimated workload: 10.1 METS
Exercise duration (min): 8 min
Exercise duration (sec): 0 s
MPHR: 152 {beats}/min
Peak HR: 166 {beats}/min
Percent HR: 109 %
Rest HR: 73 {beats}/min

## 2019-10-21 ENCOUNTER — Encounter: Payer: Self-pay | Admitting: Internal Medicine

## 2019-10-21 ENCOUNTER — Other Ambulatory Visit: Payer: Self-pay | Admitting: Internal Medicine

## 2019-10-21 DIAGNOSIS — M159 Polyosteoarthritis, unspecified: Secondary | ICD-10-CM

## 2019-10-21 MED ORDER — MELOXICAM 15 MG PO TABS: 15.0000 mg | ORAL_TABLET | Freq: Every day | ORAL | 0 refills | Status: DC

## 2019-10-28 ENCOUNTER — Ambulatory Visit
Admission: RE | Admit: 2019-10-28 | Discharge: 2019-10-28 | Disposition: A | Discharge status: Home / Self Care | Payer: Self-pay | Source: Ambulatory Visit | Attending: Internal Medicine | Admitting: Internal Medicine

## 2019-10-28 ENCOUNTER — Encounter: Payer: Self-pay | Admitting: Internal Medicine

## 2019-10-28 ENCOUNTER — Other Ambulatory Visit: Payer: Self-pay

## 2019-10-28 DIAGNOSIS — R0600 Dyspnea, unspecified: Secondary | ICD-10-CM

## 2019-10-28 DIAGNOSIS — R0609 Other forms of dyspnea: Secondary | ICD-10-CM

## 2019-11-02 DIAGNOSIS — Z85828 Personal history of other malignant neoplasm of skin: Secondary | ICD-10-CM | POA: Diagnosis not present

## 2019-11-02 DIAGNOSIS — L820 Inflamed seborrheic keratosis: Secondary | ICD-10-CM | POA: Diagnosis not present

## 2019-11-02 DIAGNOSIS — L815 Leukoderma, not elsewhere classified: Secondary | ICD-10-CM | POA: Diagnosis not present

## 2019-11-02 DIAGNOSIS — L603 Nail dystrophy: Secondary | ICD-10-CM | POA: Diagnosis not present

## 2019-11-02 DIAGNOSIS — L821 Other seborrheic keratosis: Secondary | ICD-10-CM | POA: Diagnosis not present

## 2019-11-02 DIAGNOSIS — Z23 Encounter for immunization: Secondary | ICD-10-CM | POA: Diagnosis not present

## 2019-11-02 DIAGNOSIS — L570 Actinic keratosis: Secondary | ICD-10-CM | POA: Diagnosis not present

## 2019-11-02 DIAGNOSIS — B009 Herpesviral infection, unspecified: Secondary | ICD-10-CM | POA: Diagnosis not present

## 2019-11-02 DIAGNOSIS — D225 Melanocytic nevi of trunk: Secondary | ICD-10-CM | POA: Diagnosis not present

## 2019-11-02 DIAGNOSIS — L814 Other melanin hyperpigmentation: Secondary | ICD-10-CM | POA: Diagnosis not present

## 2019-11-16 DIAGNOSIS — Z20828 Contact with and (suspected) exposure to other viral communicable diseases: Secondary | ICD-10-CM | POA: Diagnosis not present

## 2019-12-07 ENCOUNTER — Other Ambulatory Visit: Payer: Self-pay | Admitting: Internal Medicine

## 2019-12-07 DIAGNOSIS — E785 Hyperlipidemia, unspecified: Secondary | ICD-10-CM

## 2019-12-07 MED ORDER — ROSUVASTATIN CALCIUM 10 MG PO TABS: 10.0000 mg | ORAL_TABLET | Freq: Every day | ORAL | 1 refills | Status: DC

## 2019-12-21 ENCOUNTER — Other Ambulatory Visit: Payer: Self-pay | Admitting: Internal Medicine

## 2019-12-21 DIAGNOSIS — K219 Gastro-esophageal reflux disease without esophagitis: Secondary | ICD-10-CM

## 2019-12-21 MED ORDER — PANTOPRAZOLE SODIUM 20 MG PO TBEC: 20.0000 mg | DELAYED_RELEASE_TABLET | Freq: Every day | ORAL | 1 refills | Status: DC

## 2019-12-24 HISTORY — PX: HERNIA REPAIR: SHX51

## 2019-12-27 DIAGNOSIS — L603 Nail dystrophy: Secondary | ICD-10-CM | POA: Diagnosis not present

## 2019-12-27 DIAGNOSIS — Z23 Encounter for immunization: Secondary | ICD-10-CM | POA: Diagnosis not present

## 2020-01-06 DIAGNOSIS — M65332 Trigger finger, left middle finger: Secondary | ICD-10-CM | POA: Diagnosis not present

## 2020-01-06 DIAGNOSIS — M79641 Pain in right hand: Secondary | ICD-10-CM | POA: Diagnosis not present

## 2020-01-06 DIAGNOSIS — M720 Palmar fascial fibromatosis [Dupuytren]: Secondary | ICD-10-CM | POA: Diagnosis not present

## 2020-01-06 DIAGNOSIS — M79642 Pain in left hand: Secondary | ICD-10-CM | POA: Diagnosis not present

## 2020-01-12 ENCOUNTER — Ambulatory Visit: Payer: PPO | Attending: Internal Medicine

## 2020-01-12 DIAGNOSIS — Z23 Encounter for immunization: Secondary | ICD-10-CM

## 2020-01-12 NOTE — Progress Notes (Signed)
   Covid-19 Vaccination Clinic  Name:  Jonathan Young    MRN: [id number] DOB: [birth year]  01/12/2020  Jonathan Young was observed post Covid-19 immunization for 15 minutes without incidence. He was provided with Vaccine Information Sheet and instruction to access the V-Safe system.   Jonathan Young was instructed to call 911 with any severe reactions post vaccine: Marland Kitchen Difficulty breathing  . Swelling of your face and throat  . A fast heartbeat  . A bad rash all over your body  . Dizziness and weakness    Immunizations Administered    Name Date Dose VIS Date Route   Pfizer COVID-19 Vaccine 01/12/2020  6:34 PM 0.3 mL 12/03/2019 Intramuscular   Manufacturer: [place]   Lot: [id number]   [place]: [id number]

## 2020-01-19 ENCOUNTER — Other Ambulatory Visit: Payer: Self-pay | Admitting: Internal Medicine

## 2020-01-19 DIAGNOSIS — M159 Polyosteoarthritis, unspecified: Secondary | ICD-10-CM

## 2020-01-31 ENCOUNTER — Ambulatory Visit: Payer: PPO | Attending: Internal Medicine

## 2020-01-31 DIAGNOSIS — Z23 Encounter for immunization: Secondary | ICD-10-CM | POA: Insufficient documentation

## 2020-01-31 NOTE — Progress Notes (Signed)
   Covid-19 Vaccination Clinic  Name:  LINDEN PEPPLER    MRN: [id number] DOB: 1951/01/10  01/31/2020  Mr. Suda was observed post Covid-19 immunization for 15 minutes without incidence. He was provided with Vaccine Information Sheet and instruction to access the V-Safe system.   Mr. Elsenpeter was instructed to call 911 with any severe reactions post vaccine: Marland Kitchen Difficulty breathing  . Swelling of your face and throat  . A fast heartbeat  . A bad rash all over your body  . Dizziness and weakness    Immunizations Administered    Name Date Dose VIS Date Route   Pfizer COVID-19 Vaccine 01/31/2020  9:57 AM 0.3 mL 12/03/2019 Intramuscular   Manufacturer: [place]   Lot: [id number]   [place]: [id number]

## 2020-02-04 ENCOUNTER — Ambulatory Visit (HOSPITAL_COMMUNITY)
Admission: EM | Admit: 2020-02-04 | Discharge: 2020-02-04 | Disposition: A | Discharge status: Home / Self Care | Payer: PPO

## 2020-02-04 ENCOUNTER — Encounter (HOSPITAL_COMMUNITY): Payer: Self-pay

## 2020-02-04 ENCOUNTER — Other Ambulatory Visit: Payer: Self-pay

## 2020-02-04 DIAGNOSIS — S60221A Contusion of right hand, initial encounter: Secondary | ICD-10-CM | POA: Diagnosis not present

## 2020-02-04 DIAGNOSIS — S60419A Abrasion of unspecified finger, initial encounter: Secondary | ICD-10-CM

## 2020-02-04 NOTE — ED Triage Notes (Signed)
Pt fell from slipping on ice.  Abrasions to right 2-4 digits.  Swelling of right hand.  Not able to fully grip with right hand.  Small abrasion to right eyebrow.  No LOC.

## 2020-02-04 NOTE — ED Notes (Signed)
Right finger wounds cleansed, bacitracin applied and band aids applied.

## 2020-02-04 NOTE — Discharge Instructions (Addendum)
Keep the wounds clean and covered throughout the day. Utilize neosporyn or bacitracin ointments  If not improving in 2 weeks follow up with primary care

## 2020-02-04 NOTE — ED Provider Notes (Signed)
[place]    CSN: [id number] Arrival date & time: 02/04/20  1610      History   Chief Complaint Chief Complaint  Patient presents with  . Hand Pain    HPI Jonathan Young is a 69 y.o. male.   Patient reports to urgent care today for fall and injury to right hand this morning after slipping on the ice. He reports bracing his fall with his right hand with a fist. He reports pain below his thumb that is mild. He reports no issues moving his hand. He reports abrasions to his fingers. He had the abrasions covered with bandaids and using neosporyn. He reports hitting his browline an elevated portion of the deck when falling. He reports his glasses took most of this impact and gave him a small abrasion to his right eyebrow. He denies headache or much pain at the injury site.      Past Medical History:  Diagnosis Date  . Dysrhythmia    PVC'S  . GERD (gastroesophageal reflux disease)   . Hypercholesteremia   . Osteoarthritis     Patient Active Problem List   Diagnosis Date Noted  . Wound healing, delayed 09/23/2019  . Pain due to onychomycosis of nail 09/23/2019  . DOE (dyspnea on exertion) 06/16/2019  . Alcohol-induced chronic pancreatitis ([place]) 06/16/2019  . Elevated lipase 06/16/2019  . Abnormal x-ray of abdomen 06/16/2019  . Maxillary sinusitis, chronic 06/29/2017  . CRI (chronic renal insufficiency), stage 3 (moderate) ([place]) 04/07/2017  . Pure hyperglyceridemia 04/03/2017  . Essential hypertension 11/07/2016  . Hyperglycemia 04/03/2016  . GAD (generalized anxiety disorder) 10/17/2015  . Erectile dysfunction of organic origin 10/10/2015  . PSA elevation 01/10/2015  . High risk homosexual behavior 12/29/2014  . Routine general medical examination at a health care facility 12/29/2014  . GERD (gastroesophageal reflux disease) 04/23/2011  . IBS (irritable bowel syndrome) 04/23/2011  . Vitamin D deficiency 01/16/2011  . DUPUYTREN'S CONTRACTURE 01/16/2011  .  Hypogonadism male 07/25/2010  . IDIOPATHIC OSTEOPOROSIS 11/01/2008  . ANXIETY DEPRESSION 07/26/2008  . Vitamin B 12 deficiency 03/22/2008  . Osteoarthritis 08/25/2007  . Hyperlipidemia with target LDL less than 130 08/03/2007    Past Surgical History:  Procedure Laterality Date  . CARPAL TUNNEL RELEASE Left 05/23/2018  . CARPAL TUNNEL RELEASE Right 11/22/2018  . [place]  . SHOULDER SURGERY     BIL  . TONSILLECTOMY AND ADENOIDECTOMY    . TOTAL HIP ARTHROPLASTY     RT TOAL HIP  . TOTAL HIP ARTHROPLASTY  06/01/2012   Procedure: TOTAL HIP ARTHROPLASTY ANTERIOR APPROACH;  Surgeon: Mauri Pole, MD;  Location: [facility];  Service: Orthopedics;  Laterality: Left;       Home Medications    Prior to Admission medications   Medication Sig Start Date End Date Taking? Authorizing Provider  ALPRAZolam Duanne Moron) 0.5 MG tablet Take 1 tablet (0.5 mg total) by mouth at bedtime as needed for anxiety. 10/06/19  Yes Janith Lima, MD  b complex vitamins tablet Take 1 tablet by mouth daily.   Yes [provider]  meloxicam (MOBIC) 15 MG tablet TAKE 1 TABLET(15 MG) BY MOUTH DAILY 01/19/20  Yes Janith Lima, MD  pantoprazole (PROTONIX) 20 MG tablet Take 1 tablet (20 mg total) by mouth daily. 12/21/19  Yes Janith Lima, MD  rosuvastatin (CRESTOR) 10 MG tablet Take 1 tablet (10 mg total) by mouth daily. 12/07/19  Yes Janith Lima, MD  tadalafil (ADCIRCA/CIALIS) 20  MG tablet TAKE 1 TABLET BY MOUTH 1 HOUR PRIOR TO INTERCOURSE; MAXIMUM OF 1 TABLET IN 24 HOURS! Patient taking differently: Take 20 mg by mouth See admin instructions. Take 20 mg by mouth one hour prior to intercourse- maximum of 20 mg (1 tablet) in 24 hours 01/26/19  Yes Janith Lima, MD  valACYclovir (VALTREX) 1000 MG tablet Take 0.5 tablets (500 mg total) by mouth daily. 08/26/18  Yes Janith Lima, MD  vitamin C (ASCORBIC ACID) 500 MG tablet Take 500 mg by mouth daily.   Yes [provider]  Vitamin  D, Ergocalciferol, (DRISDOL) 1.25 MG (50000 UT) CAPS capsule Take 1 capsule (50,000 Units total) by mouth every Saturday. 07/10/19  Yes Janith Lima, MD  emtricitabine-tenofovir AF (DESCOVY) 200-25 MG tablet Take 1 tablet by mouth daily. 06/18/19   Janith Lima, MD    Family History Family History  Problem Relation Age of Onset  . Emphysema Mother   . Colon cancer Neg Hx   . Esophageal cancer Neg Hx   . Rectal cancer Neg Hx   . Stomach cancer Neg Hx     Social History Social History   Tobacco Use  . Smoking status: Former Smoker    Quit date: 05/27/1981    Years since quitting: 38.7  . Smokeless tobacco: Never Used  Substance Use Topics  . Alcohol use: Yes    Alcohol/week: 14.0 standard drinks    Types: 14 Shots of liquor per week  . Drug use: No     Allergies   Ezetimibe-simvastatin   Review of Systems Review of Systems  Skin: Positive for wound. Negative for color change and rash.  Neurological: Negative for headaches.  Hematological: Does not bruise/bleed easily.  All other systems reviewed and are negative.    Physical Exam Triage Vital Signs ED Triage Vitals  Enc Vitals Group     BP 02/04/20 1657 130/83     Pulse Rate 02/04/20 1657 74     Resp --      Temp 02/04/20 1657 98.2 F (36.8 C)     Temp Source 02/04/20 1657 Oral     SpO2 02/04/20 1657 97 %     Weight --      Height --      Head Circumference --      Peak Flow --      Pain Score 02/04/20 1652 2     Pain Loc --      Pain Edu? --      Excl. in [place]? --    No data found.  Updated Vital Signs BP 130/83 (BP Location: Left Arm)   Pulse 74   Temp 98.2 F (36.8 C) (Oral)   SpO2 97%   Visual Acuity Right Eye Distance:   Left Eye Distance:   Bilateral Distance:    Right Eye Near:   Left Eye Near:    Bilateral Near:     Physical Exam Vitals and nursing note reviewed.  Constitutional:      General: He is not in acute distress.    Appearance: Normal appearance. He is well-developed.  He is not ill-appearing.  HENT:     Head: Normocephalic.     Comments: Small superficial abrasion at corner of right brow line. No bleeding, no hematoma. Otherwise atraumatic Eyes:     General: No scleral icterus.    Extraocular Movements: Extraocular movements intact.     Conjunctiva/sclera: Conjunctivae normal.     Pupils: Pupils are equal,  round, and reactive to light.  Cardiovascular:     Rate and Rhythm: Normal rate.  Pulmonary:     Effort: Pulmonary effort is normal. No respiratory distress.  Musculoskeletal:        General: No deformity.     Cervical back: Neck supple.     Comments: Mild ttp at right radial styloid. Full ROM of right hand. No swelling or Edema  Skin:    General: Skin is warm and dry.     Comments: Abrasions to fingers 2-4 on Right hand. 4th finger injury with skin avulsion, not repairable.   Neurological:     General: No focal deficit present.     Mental Status: He is alert and oriented to person, place, and time.  Psychiatric:        Mood and Affect: Mood normal.        Behavior: Behavior normal.        Thought Content: Thought content normal.        Judgment: Judgment normal.      UC Treatments / Results  Labs (all labs ordered are listed, but only abnormal results are displayed) Labs Reviewed - No data to display  EKG   Radiology No results found.  Procedures Procedures (including critical care time)  Medications Ordered in UC Medications - No data to display  Initial Impression / Assessment and Plan / UC Course  I have reviewed the triage vital signs and the nursing notes.  Pertinent labs & imaging results that were available during my care of the patient were reviewed by me and considered in my medical decision making (see chart for details).     #Hand contustion #finger abrasion No concern for fracture. Wounds not amenable for repair, will heal by secondary intention. Wound care discussed. Follow up with PCP if not healing or  injury worsens.   Final Clinical Impressions(s) / UC Diagnoses   Final diagnoses:  Contusion of right hand, initial encounter  Abrasion of finger, initial encounter     Discharge Instructions     Keep the wounds clean and covered throughout the day. Utilize neosporyn or bacitracin ointments  If not improving in 2 weeks follow up with primary care      ED Prescriptions    None     PDMP not reviewed this encounter.   Purnell Shoemaker, PA-C 02/04/20 1736

## 2020-02-14 ENCOUNTER — Other Ambulatory Visit: Payer: Self-pay | Admitting: Internal Medicine

## 2020-02-14 DIAGNOSIS — R911 Solitary pulmonary nodule: Secondary | ICD-10-CM | POA: Insufficient documentation

## 2020-02-28 ENCOUNTER — Ambulatory Visit (INDEPENDENT_AMBULATORY_CARE_PROVIDER_SITE_OTHER)
Admission: RE | Admit: 2020-02-28 | Discharge: 2020-02-28 | Disposition: A | Discharge status: Home / Self Care | Payer: PPO | Source: Ambulatory Visit | Attending: Internal Medicine | Admitting: Internal Medicine

## 2020-02-28 ENCOUNTER — Other Ambulatory Visit: Payer: PPO

## 2020-02-28 ENCOUNTER — Other Ambulatory Visit: Payer: Self-pay

## 2020-02-28 DIAGNOSIS — R918 Other nonspecific abnormal finding of lung field: Secondary | ICD-10-CM | POA: Diagnosis not present

## 2020-02-28 DIAGNOSIS — R911 Solitary pulmonary nodule: Secondary | ICD-10-CM

## 2020-02-29 ENCOUNTER — Encounter: Payer: Self-pay | Admitting: Internal Medicine

## 2020-03-02 ENCOUNTER — Ambulatory Visit (INDEPENDENT_AMBULATORY_CARE_PROVIDER_SITE_OTHER): Payer: PPO | Admitting: Internal Medicine

## 2020-03-02 ENCOUNTER — Other Ambulatory Visit: Payer: Self-pay

## 2020-03-02 ENCOUNTER — Encounter: Payer: Self-pay | Admitting: Internal Medicine

## 2020-03-02 VITALS — BP 120/80 | HR 73 | Temp 98.3°F | Ht 73.0 in | Wt 203.0 lb

## 2020-03-02 DIAGNOSIS — Z0000 Encounter for general adult medical examination without abnormal findings: Secondary | ICD-10-CM | POA: Diagnosis not present

## 2020-03-02 DIAGNOSIS — R972 Elevated prostate specific antigen [PSA]: Secondary | ICD-10-CM

## 2020-03-02 DIAGNOSIS — N400 Enlarged prostate without lower urinary tract symptoms: Secondary | ICD-10-CM

## 2020-03-02 DIAGNOSIS — K760 Fatty (change of) liver, not elsewhere classified: Secondary | ICD-10-CM | POA: Insufficient documentation

## 2020-03-02 DIAGNOSIS — E559 Vitamin D deficiency, unspecified: Secondary | ICD-10-CM

## 2020-03-02 DIAGNOSIS — Z7252 High risk homosexual behavior: Secondary | ICD-10-CM | POA: Diagnosis not present

## 2020-03-02 DIAGNOSIS — R5382 Chronic fatigue, unspecified: Secondary | ICD-10-CM

## 2020-03-02 DIAGNOSIS — E538 Deficiency of other specified B group vitamins: Secondary | ICD-10-CM | POA: Diagnosis not present

## 2020-03-02 DIAGNOSIS — K439 Ventral hernia without obstruction or gangrene: Secondary | ICD-10-CM

## 2020-03-02 DIAGNOSIS — I10 Essential (primary) hypertension: Secondary | ICD-10-CM

## 2020-03-02 DIAGNOSIS — E781 Pure hyperglyceridemia: Secondary | ICD-10-CM | POA: Diagnosis not present

## 2020-03-02 DIAGNOSIS — N183 Chronic kidney disease, stage 3 unspecified: Secondary | ICD-10-CM

## 2020-03-02 DIAGNOSIS — D539 Nutritional anemia, unspecified: Secondary | ICD-10-CM | POA: Diagnosis not present

## 2020-03-02 LAB — URINALYSIS, ROUTINE W REFLEX MICROSCOPIC
Bilirubin Urine: NEGATIVE
Hgb urine dipstick: NEGATIVE
Ketones, ur: NEGATIVE
Leukocytes,Ua: NEGATIVE
Nitrite: NEGATIVE
RBC / HPF: NONE SEEN (ref 0–?)
Specific Gravity, Urine: 1.02 (ref 1.000–1.030)
Total Protein, Urine: NEGATIVE
Urine Glucose: NEGATIVE
Urobilinogen, UA: 0.2 (ref 0.0–1.0)
pH: 5.5 (ref 5.0–8.0)

## 2020-03-02 LAB — BASIC METABOLIC PANEL
BUN: 20 mg/dL (ref 6–23)
CO2: 27 mEq/L (ref 19–32)
Calcium: 9.5 mg/dL (ref 8.4–10.5)
Chloride: 105 mEq/L (ref 96–112)
Creatinine, Ser: 1.13 mg/dL (ref 0.40–1.50)
GFR: 64.31 mL/min (ref >60.00)
Glucose, Bld: 97 mg/dL (ref 70–99)
Potassium: 4.5 mEq/L (ref 3.5–5.1)
Sodium: 139 mEq/L (ref 135–145)

## 2020-03-02 LAB — FOLATE: Folate: >23.7 ng/mL (ref >5.9)

## 2020-03-02 LAB — HEPATIC FUNCTION PANEL
ALT: 40 U/L (ref 0–53)
AST: 37 U/L (ref 0–37)
Albumin: 4.4 g/dL (ref 3.5–5.2)
Alkaline Phosphatase: 55 U/L (ref 39–117)
Bilirubin, Direct: 0.1 mg/dL (ref 0.0–0.3)
Total Bilirubin: 0.5 mg/dL (ref 0.2–1.2)
Total Protein: 7.1 g/dL (ref 6.0–8.3)

## 2020-03-02 LAB — PROTIME-INR
INR: 1 ratio (ref 0.8–1.0)
Prothrombin Time: 10.8 s (ref 9.6–13.1)

## 2020-03-02 LAB — TSH: TSH: 1.57 u[IU]/mL (ref 0.35–4.50)

## 2020-03-02 LAB — CBC WITH DIFFERENTIAL/PLATELET
Basophils Absolute: 0 10*3/uL (ref 0.0–0.1)
Basophils Relative: 0.6 % (ref 0.0–3.0)
Eosinophils Absolute: 0.3 10*3/uL (ref 0.0–0.7)
Eosinophils Relative: 4.5 % (ref 0.0–5.0)
HCT: 43.5 % (ref 39.0–52.0)
Hemoglobin: 14.7 g/dL (ref 13.0–17.0)
Lymphocytes Relative: 35.5 % (ref 12.0–46.0)
Lymphs Abs: 2 10*3/uL (ref 0.7–4.0)
MCHC: 33.7 g/dL (ref 30.0–36.0)
MCV: 95.2 fl (ref 78.0–100.0)
Monocytes Absolute: 0.5 10*3/uL (ref 0.1–1.0)
Monocytes Relative: 8.5 % (ref 3.0–12.0)
Neutro Abs: 2.9 10*3/uL (ref 1.4–7.7)
Neutrophils Relative %: 50.9 % (ref 43.0–77.0)
Platelets: 208 10*3/uL (ref 150.0–400.0)
RBC: 4.57 Mil/uL (ref 4.22–5.81)
RDW: 13 % (ref 11.5–15.5)
WBC: 5.7 10*3/uL (ref 4.0–10.5)

## 2020-03-02 LAB — LIPID PANEL
Cholesterol: 187 mg/dL (ref 0–200)
HDL: 45.8 mg/dL (ref >39.00)
LDL Cholesterol: 110 mg/dL — ABNORMAL HIGH (ref 0–99)
NonHDL: 140.75
Total CHOL/HDL Ratio: 4
Triglycerides: 152 mg/dL — ABNORMAL HIGH (ref 0.0–149.0)
VLDL: 30.4 mg/dL (ref 0.0–40.0)

## 2020-03-02 LAB — FERRITIN: Ferritin: 43.1 ng/mL (ref 22.0–322.0)

## 2020-03-02 LAB — VITAMIN D 25 HYDROXY (VIT D DEFICIENCY, FRACTURES): VITD: 55.98 ng/mL (ref 30.00–100.00)

## 2020-03-02 LAB — PSA: PSA: 7.8

## 2020-03-02 LAB — VITAMIN B12: Vitamin B-12: 882 pg/mL (ref 211–911)

## 2020-03-02 NOTE — Progress Notes (Signed)
Subjective:  Patient ID: Jonathan Young, male    DOB: [birth year]  Age: 69 y.o. MRN: [id number]  CC: Annual Exam  This visit occurred during the SARS-CoV-2 public health emergency.  Safety protocols were in place, including screening questions prior to the visit, additional usage of staff PPE, and extensive cleaning of exam room while observing appropriate contact time as indicated for disinfecting solutions.    HPI Jonathan Young presents for a CPX.  He has a history of anemia and wants to be screened for vitamin deficiencies.  He has had no sexual activity over the last year but he anticipates being sexually active soon and wants to start taking PrEP again.  He will not take Descovy because it is too expensive.  He is very active.  He tells me he hikes several miles and does not experience CP, DOE, palpitations, edema, or fatigue.    He drinks about 5 stiff drinks per night.  He recently underwent a CT scan of the chest that revealed fatty liver infiltration.  He denies abdominal pain.  He has had a ventral hernia for many years.  He was previously told not to worry about it.  He worries it is becoming larger and he wants to see a surgeon to see if they can be repaired.  Outpatient Medications Prior to Visit  Medication Sig Dispense Refill  . ALPRAZolam (XANAX) 0.5 MG tablet Take 1 tablet (0.5 mg total) by mouth at bedtime as needed for anxiety. 30 tablet 1  . b complex vitamins tablet Take 1 tablet by mouth daily.    . pantoprazole (PROTONIX) 20 MG tablet Take 1 tablet (20 mg total) by mouth daily. 90 tablet 1  . rosuvastatin (CRESTOR) 10 MG tablet Take 1 tablet (10 mg total) by mouth daily. 90 tablet 1  . tadalafil (ADCIRCA/CIALIS) 20 MG tablet TAKE 1 TABLET BY MOUTH 1 HOUR PRIOR TO INTERCOURSE; MAXIMUM OF 1 TABLET IN 24 HOURS! (Patient taking differently: Take 20 mg by mouth See admin instructions. Take 20 mg by mouth one hour prior to intercourse- maximum of 20 mg (1 tablet) in 24  hours) 10 tablet 5  . valACYclovir (VALTREX) 1000 MG tablet Take 0.5 tablets (500 mg total) by mouth daily. 30 tablet 0  . vitamin C (ASCORBIC ACID) 500 MG tablet Take 500 mg by mouth daily.    . Vitamin D, Ergocalciferol, (DRISDOL) 1.25 MG (50000 UT) CAPS capsule Take 1 capsule (50,000 Units total) by mouth every Saturday. 90 capsule 1  . emtricitabine-tenofovir AF (DESCOVY) 200-25 MG tablet Take 1 tablet by mouth daily. 90 tablet 1  . meloxicam (MOBIC) 15 MG tablet TAKE 1 TABLET(15 MG) BY MOUTH DAILY 90 tablet 0   No facility-administered medications prior to visit.    ROS Review of Systems  Constitutional: Positive for fatigue. Negative for chills, diaphoresis, fever and unexpected weight change.  HENT: Negative for sore throat and trouble swallowing.   Eyes: Negative.  Negative for visual disturbance.  Respiratory: Negative for cough, chest tightness and wheezing.   Cardiovascular: Negative for chest pain, palpitations and leg swelling.  Gastrointestinal: Negative for abdominal pain, blood in stool, constipation, diarrhea, nausea and vomiting.  Endocrine: Negative.   Genitourinary: Negative.  Negative for difficulty urinating, discharge, genital sores, penile pain, penile swelling, scrotal swelling, testicular pain and urgency.  Musculoskeletal: Negative for arthralgias and myalgias.  Skin: Negative.  Negative for color change and pallor.  Neurological: Negative.   Hematological: Negative.  Negative for adenopathy. Does  not bruise/bleed easily.  Psychiatric/Behavioral: Negative.     Objective:  BP 120/80 (BP Location: Left Arm, Patient Position: Sitting, Cuff Size: Large)   Pulse 73   Temp 98.3 F (36.8 C) (Oral)   Ht 6\' 1"  (1.854 m)   Wt 203 lb (92.1 kg)   SpO2 98%   BMI 26.78 kg/m   BP Readings from Last 3 Encounters:  03/02/20 120/80  02/04/20 130/83  09/23/19 138/84    Wt Readings from Last 3 Encounters:  03/02/20 203 lb (92.1 kg)  09/23/19 201 lb 8 oz (91.4 kg)   06/16/19 201 lb 4 oz (91.3 kg)    Physical Exam Vitals reviewed.  Constitutional:      Appearance: Normal appearance.  HENT:     Nose: Nose normal.     Mouth/Throat:     Mouth: Mucous membranes are moist.  Eyes:     General: No scleral icterus.    Conjunctiva/sclera: Conjunctivae normal.  Cardiovascular:     Rate and Rhythm: Normal rate and regular rhythm.     Heart sounds: No murmur.     Comments: EKG-  NSR Normal EKG Pulmonary:     Effort: Pulmonary effort is normal.     Breath sounds: No stridor. No wheezing, rhonchi or rales.  Abdominal:     General: Abdomen is flat. Bowel sounds are normal. There is no distension.     Palpations: Abdomen is soft. There is no hepatomegaly, splenomegaly or mass.     Tenderness: There is no abdominal tenderness. There is no guarding or rebound.     Hernia: A hernia is present. Hernia is present in the umbilical area and ventral area. There is no hernia in the left inguinal area, right femoral area, left femoral area or right inguinal area.  Genitourinary:    Pubic Area: No rash.      Penis: Normal and circumcised. No discharge, swelling or lesions.      Testes: Normal.        Right: Mass not present.        Left: Mass not present.     Epididymis:     Right: Normal. Not inflamed or enlarged. No mass.     Left: Not enlarged. No mass.     Prostate: Enlarged. Not tender and no nodules present.     Rectum: Normal. Guaiac result negative. No mass, tenderness, anal fissure, external hemorrhoid or internal hemorrhoid. Normal anal tone.  Musculoskeletal:     Cervical back: Neck supple.     Right lower leg: No edema.     Left lower leg: No edema.  Lymphadenopathy:     Cervical: No cervical adenopathy.  Skin:    General: Skin is warm and dry.     Findings: No lesion or rash.  Neurological:     General: No focal deficit present.     Mental Status: He is alert and oriented to person, place, and time. Mental status is at baseline.   Psychiatric:        Mood and Affect: Mood normal.        Behavior: Behavior normal.     Lab Results  Component Value Date   WBC 5.7 03/02/2020   HGB 14.7 03/02/2020   HCT 43.5 03/02/2020   PLT 208.0 03/02/2020   GLUCOSE 97 03/02/2020   CHOL 187 03/02/2020   TRIG 152.0 (H) 03/02/2020   HDL 45.80 03/02/2020   LDLDIRECT 87.0 04/03/2016   LDLCALC 110 (H) 03/02/2020   ALT 40 03/02/2020  AST 37 03/02/2020   NA 139 03/02/2020   K 4.5 03/02/2020   CL 105 03/02/2020   CREATININE 1.13 03/02/2020   BUN 20 03/02/2020   CO2 27 03/02/2020   TSH 1.57 03/02/2020   PSA 7.8 03/02/2020   INR 1.0 03/02/2020   HGBA1C 5.8 04/03/2016    CT Chest Wo Contrast  Result Date: 02/29/2020 CLINICAL DATA:  Follow-up pulmonary nodule. EXAM: CT CHEST WITHOUT CONTRAST TECHNIQUE: Multidetector CT imaging of the chest was performed following the standard protocol without IV contrast. COMPARISON:  10/28/2019 FINDINGS: Cardiovascular: The heart size is normal. No substantial pericardial effusion. Coronary artery calcification is evident. No thoracic aortic aneurysm. Mediastinum/Nodes: No mediastinal lymphadenopathy. No evidence for gross hilar lymphadenopathy although assessment is limited by the lack of intravenous contrast on today's study. The esophagus has normal imaging features. There is no axillary lymphadenopathy. Lungs/Pleura: Centrilobular emphsyema noted. 2 mm right middle lobe perifissural nodule on 84/3 is stable. Right middle lobe calcified granuloma again noted. 2-3 mm right middle lobe nodule on 114/3 is unchanged. 6 mm right lower lobe subpleural nodule (133/3) is stable. Calcified granuloma noted posterior right costophrenic sulcus. No suspicious pulmonary nodule or mass in the left lung. No focal airspace consolidation. No pleural effusion. Upper Abdomen: The liver shows diffusely decreased attenuation suggesting fat deposition. Musculoskeletal: No worrisome lytic or sclerotic osseous abnormality.  IMPRESSION: 1. Previously identified tiny calcified and noncalcified right pulmonary nodules are stable in the interval. The posterior right costophrenic sulcus granuloma was not included on the previous exam. No new suspicious pulmonary nodule or mass. 2. Dominant noncalcified nodule is the 6 mm finding in the subpleural right lower lobe, unchanged in the 4 month interval since prior study. 14 -20 month follow-up CT from today is considered optional for low-risk patients, but is recommended for high-risk patients. This recommendation follows the consensus statement: Guidelines for Management of Incidental Pulmonary Nodules Detected on CT Images: From the Fleischner Society 2017; Radiology 2017; 284:228-243. 3. Hepatic steatosis. Electronically Signed   By: Misty Stanley M.D.   On: 02/29/2020 07:57    Assessment & Plan:   Jonathan Young was seen today for annual exam.  Diagnoses and all orders for this visit:  Fatty infiltration of liver- His MELD score is 7 which is a reassuring sign that he will not have any complications related to the liver disease over the next year.  I asked him to limit his alcohol intake to 2 beverages a day. -     Hepatic function panel -     Protime-INR  Essential hypertension- His blood pressure is adequately well controlled. -     Basic metabolic panel -     EKG [id number]  CRI (chronic renal insufficiency), stage 3 (moderate) (HCC)- His renal function has improved.  His GFR is above 60 so he can safely take Truvada. -     Basic metabolic panel -     Urinalysis, Routine w reflex microscopic  High risk homosexual behavior- His GFR is above 60.  Screening for HIV and hep B is negative.  Will start PrEP with Truvada. -     Hepatitis B surface antigen -     HIV Antibody (routine testing w rflx) -     RPR -     emtricitabine-tenofovir (TRUVADA) 200-300 MG tablet; Take 1 tablet by mouth daily.  PSA elevation- His PSA continues to rise.  He is being followed closely by  urology. -     PSA, total and free -  Ambulatory referral to Urology  Pure hyperglyceridemia- His triglycerides are mildly elevated.  This does not need to be treated with a medication. -     Lipid panel  Vitamin B 12 deficiency -     CBC with Differential/Platelet -     Vitamin B12  Vitamin D deficiency -     VITAMIN D 25 Hydroxy (Vit-D Deficiency, Fractures)  Routine general medical examination at a health care facility- Exam completed, labs reviewed, vaccines reviewed and updated, colon cancer screening is up-to-date, patient education was given.  Deficiency anemia- His H&H and vitamin levels are normal. -     CBC with Differential/Platelet -     Vitamin B12 -     Ferritin -     Folate -     Vitamin B1 -     Zinc; Future -     Zinc  Chronic fatigue- Labs to screen for secondary causes are normal.  His EKG is normal.  This is likely related to his age. -     TSH  Ventral hernia without obstruction or gangrene -     Ambulatory referral to General Surgery  BPH with elevated PSA -     Ambulatory referral to Urology  Other orders -     Extra Specimen   I have discontinued Jonathan Young's Descovy and meloxicam. I am also having him start on emtricitabine-tenofovir. Additionally, I am having him maintain his valACYclovir, tadalafil, b complex vitamins, vitamin C, Vitamin D (Ergocalciferol), ALPRAZolam, rosuvastatin, and pantoprazole.  Meds ordered this encounter  Medications  . emtricitabine-tenofovir (TRUVADA) 200-300 MG tablet    Sig: Take 1 tablet by mouth daily.    Dispense:  90 tablet    Refill:  0     Follow-up: Return in about 3 months (around 06/02/2020).  Scarlette Calico, MD

## 2020-03-02 NOTE — Patient Instructions (Signed)

## 2020-03-05 ENCOUNTER — Encounter: Payer: Self-pay | Admitting: Internal Medicine

## 2020-03-06 ENCOUNTER — Encounter: Payer: Self-pay | Admitting: Internal Medicine

## 2020-03-06 LAB — RPR: RPR Ser Ql: NONREACTIVE

## 2020-03-06 LAB — HEPATITIS B SURFACE ANTIGEN: Hepatitis B Surface Ag: NONREACTIVE

## 2020-03-06 LAB — PSA, TOTAL AND FREE
PSA, % Free: 17 % (calc) — ABNORMAL LOW (ref >25)
PSA, Free: 1.3 ng/mL
PSA, Total: 7.8 ng/mL — ABNORMAL HIGH (ref <=4.0)

## 2020-03-06 LAB — EXTRA SPECIMEN

## 2020-03-06 LAB — HIV ANTIBODY (ROUTINE TESTING W REFLEX): HIV 1&2 Ab, 4th Generation: NONREACTIVE

## 2020-03-06 LAB — VITAMIN B1: Vitamin B1 (Thiamine): 36 nmol/L — ABNORMAL HIGH (ref 8–30)

## 2020-03-06 LAB — ZINC: Zinc: 74 ug/dL (ref 60–130)

## 2020-03-06 MED ORDER — EMTRICITABINE-TENOFOVIR DF 200-300 MG PO TABS: 1.0000 | ORAL_TABLET | Freq: Every day | ORAL | 0 refills | Status: DC

## 2020-04-03 DIAGNOSIS — N401 Enlarged prostate with lower urinary tract symptoms: Secondary | ICD-10-CM | POA: Diagnosis not present

## 2020-04-03 DIAGNOSIS — R3912 Poor urinary stream: Secondary | ICD-10-CM | POA: Diagnosis not present

## 2020-04-03 DIAGNOSIS — R972 Elevated prostate specific antigen [PSA]: Secondary | ICD-10-CM | POA: Diagnosis not present

## 2020-04-17 ENCOUNTER — Ambulatory Visit: Payer: Self-pay | Admitting: Surgery

## 2020-04-17 DIAGNOSIS — M6208 Separation of muscle (nontraumatic), other site: Secondary | ICD-10-CM | POA: Diagnosis not present

## 2020-04-17 DIAGNOSIS — K429 Umbilical hernia without obstruction or gangrene: Secondary | ICD-10-CM | POA: Diagnosis not present

## 2020-04-17 NOTE — H&P (Signed)
Jonathan Young Appointment: 04/17/2020 11:00 AM Location: [facility] Patient #: [id number] DOB: 28-Nov-1951 Single / Language: Jonathan Young / Race: White Male  History of Present Illness Jonathan Young A. Jonathan Hearns MD; 04/17/2020 12:52 PM) Patient words: Patient presents for evaluation of abdominal wall bulge. Location is in the midline. He also has a small umbilical hernia. As causing him discomfort. It does pop and pop out. No change in bowel or bladder function.  The patient is a 69 year old male.   Past Surgical History Jonathan Young, [place]; 04/17/2020 11:07 AM) Hemorrhoidectomy Hip Surgery Bilateral. Shoulder Surgery Bilateral. Spinal Surgery - Neck Tonsillectomy  Diagnostic Studies History ([name], [name]; 04/17/2020 11:07 AM) Colonoscopy 1-5 years ago  Allergies ([name], [name]; 04/17/2020 11:08 AM) No Known Drug Allergies [04/17/2020]: Allergies Reconciled  Medication History ([name], [name]; 04/17/2020 11:09 AM) Tamsulosin HCl (0.4MG  Capsule, Oral) Active. B Complex (Oral) Active. Vitamin C (500MG  Tablet Chewable, Oral) Active. Pantoprazole Sodium (20MG  Tablet DR, Oral) Active. Rosuvastatin Calcium (10MG  Tablet, Oral) Active. CoQ-10 (Oral) Specific strength unknown - Active. valACYclovir HCl (1GM Tablet, Oral) Active. Medications Reconciled  Social History Jonathan Young, [name]; 04/17/2020 11:07 AM) Alcohol use Moderate alcohol use. Caffeine use Coffee. Tobacco use Former smoker.  Family History ([place], [place]; 04/17/2020 11:07 AM) Respiratory Condition Mother.  Other Problems ([name], [name]; 04/17/2020 11:07 AM) Back Pain Gastroesophageal Reflux Disease Hypercholesterolemia Umbilical Hernia Repair Ventral Hernia Repair     Review of Systems ([name]; 04/17/2020 12:52 PM) General Present- Fatigue. Not Present- Appetite Loss, Chills, Fever, Night Sweats, Weight Gain and Weight Loss. Skin Present- Dryness. Not  Present- Change in Wart/Mole, Hives, Jaundice, New Lesions, Non-Healing Wounds, Rash and Ulcer. HEENT Present- Ringing in the Ears. Not Present- Earache, Hearing Loss, Hoarseness, Nose Bleed, Oral Ulcers, Seasonal Allergies, Sinus Pain, Sore Throat, Visual Disturbances, Wears glasses/contact lenses and Yellow Eyes. Respiratory Not Present- Bloody sputum, Chronic Cough, Difficulty Breathing, Snoring and Wheezing. Breast Not Present- Breast Mass, Breast Pain, Nipple Discharge and Skin Changes. Cardiovascular Present- Shortness of Breath. Not Present- Chest Pain, Difficulty Breathing Lying Down, Leg Cramps, Palpitations, Rapid Heart Rate and Swelling of Extremities. Gastrointestinal Present- Excessive gas. Not Present- Abdominal Pain, Bloating, Bloody Stool, Change in Bowel Habits, Chronic diarrhea, Constipation, Difficulty Swallowing, Gets full quickly at meals, Hemorrhoids, Indigestion, Nausea, Rectal Pain and Vomiting. Male Genitourinary Present- Frequency. Not Present- Blood in Urine, Change in Urinary Stream, Impotence, Nocturia, Painful Urination, Urgency and Urine Leakage. Musculoskeletal Present- Joint Stiffness. Not Present- Back Pain, Joint Pain, Muscle Pain, Muscle Weakness and Swelling of Extremities. Neurological Not Present- Decreased Memory, Fainting, Headaches, Numbness, Seizures, Tingling, Tremor, Trouble walking and Weakness. Psychiatric Not Present- Anxiety, Bipolar, Change in Sleep Pattern, Depression, Fearful and Frequent crying. Endocrine Not Present- Cold Intolerance, Excessive Hunger, Hair Changes, Heat Intolerance, Hot flashes and New Diabetes. Hematology Not Present- Blood Thinners, Easy Bruising, Excessive bleeding, Gland problems, HIV and Persistent Infections. All other systems negative  Vitals ([name]; 04/17/2020 11:10 AM) 04/17/2020 11:09 AM Weight: 204.5 lb Height: 71in Body Surface Area: 2.13 m Body Mass Index: 28.52 kg/m  Temp.: 85F  Pulse: 99  (Regular)  BP: 120/72(Sitting, Left Arm, Standard)        Physical Exam (Jonathan Young A. [name] MD; 04/17/2020 12:53 PM)  General Mental Status-Alert. General Appearance-Consistent with stated age. Hydration-Well hydrated. Voice-Normal.  Chest and Lung Exam Chest and lung exam reveals -quiet, even and easy respiratory effort with no use of accessory muscles and on auscultation, normal breath sounds,  no adventitious sounds and normal vocal resonance. Inspection Chest Wall - Normal. Back - normal.  Cardiovascular Cardiovascular examination reveals -normal heart sounds, regular rate and rhythm with no murmurs and normal pedal pulses bilaterally.  Abdomen Note: Small reducible umbilical hernia. Rectus diastases noted. Soft nontender without rebound or guarding  Neurologic Neurologic evaluation reveals -alert and oriented x 3 with no impairment of recent or remote memory. Mental Status-Normal.  Musculoskeletal Normal Exam - Left-Upper Extremity Strength Normal and Lower Extremity Strength Normal. Normal Exam - Right-Upper Extremity Strength Normal and Lower Extremity Strength Normal.    Assessment & Plan ([name] MD; [id number] PM)  UMBILICAL HERNIA WITHOUT OBSTRUCTION AND WITHOUT GANGRENE (K42.9) Impression: Discuss repair of his umbilical hernia with mesh. Risks, benefits and other treatment options discussed as well as observation. He is having mild to moderate symptoms of discomfort at the umbilicus and desires repair. He has a significant component of rectus diastases switches not amendable surgical treatment and is considered cosmetic which I discussed with him. The risk of hernia repair include bleeding, infection, organ injury, bowel injury, bladder injury, nerve injury recurrent hernia, blood clots, worsening of underlying condition, chronic pain, mesh use, open surgery, death, and the need for other operations. Pt agrees to  proceed   total time 40 min for examination, review of chart, documentation  Current Plans You are being scheduled for surgery- Our schedulers will call you.  You should hear from our office's scheduling department within 5 working days about the location, date, and time of surgery. We try to make accommodations for patient's preferences in scheduling surgery, but sometimes the OR schedule or the surgeon's schedule prevents [place] from making those accommodations.  If you have not heard from our office [phone]) in 5 working days, call the office and ask for your surgeon's nurse.  If you have other questions about your diagnosis, plan, or surgery, call the office and ask for your surgeon's nurse.  Pt Education - Pamphlet Given - Hernia Surgery: discussed with patient and provided information. Pt Education - CCS Mesh education: discussed with patient and provided information. CCS Consent - Hernia Repair - Ventral/Incisional/Umbilical (Gross): discussed with patient and provided information.  RECTUS DIASTASIS (M62.08) Impression: Discussed pathophysiology and the fact this is not a surgical disease but can be corrected cosmetically if desired

## 2020-04-18 ENCOUNTER — Other Ambulatory Visit: Payer: Self-pay | Admitting: Internal Medicine

## 2020-04-18 DIAGNOSIS — M159 Polyosteoarthritis, unspecified: Secondary | ICD-10-CM

## 2020-04-18 DIAGNOSIS — E785 Hyperlipidemia, unspecified: Secondary | ICD-10-CM

## 2020-05-29 ENCOUNTER — Other Ambulatory Visit: Payer: Self-pay | Admitting: Internal Medicine

## 2020-05-29 DIAGNOSIS — F411 Generalized anxiety disorder: Secondary | ICD-10-CM

## 2020-06-06 ENCOUNTER — Encounter: Payer: Self-pay | Admitting: Internal Medicine

## 2020-06-06 ENCOUNTER — Telehealth: Payer: Self-pay

## 2020-06-06 ENCOUNTER — Other Ambulatory Visit: Payer: Self-pay

## 2020-06-06 ENCOUNTER — Ambulatory Visit (INDEPENDENT_AMBULATORY_CARE_PROVIDER_SITE_OTHER): Payer: PPO | Admitting: Internal Medicine

## 2020-06-06 VITALS — BP 124/78 | HR 100 | Temp 97.9°F | Resp 16 | Ht 73.0 in | Wt 200.2 lb

## 2020-06-06 DIAGNOSIS — F5104 Psychophysiologic insomnia: Secondary | ICD-10-CM

## 2020-06-06 DIAGNOSIS — Z7252 High risk homosexual behavior: Secondary | ICD-10-CM

## 2020-06-06 DIAGNOSIS — E538 Deficiency of other specified B group vitamins: Secondary | ICD-10-CM

## 2020-06-06 DIAGNOSIS — N183 Chronic kidney disease, stage 3 unspecified: Secondary | ICD-10-CM | POA: Diagnosis not present

## 2020-06-06 DIAGNOSIS — I10 Essential (primary) hypertension: Secondary | ICD-10-CM

## 2020-06-06 DIAGNOSIS — K760 Fatty (change of) liver, not elsewhere classified: Secondary | ICD-10-CM | POA: Diagnosis not present

## 2020-06-06 DIAGNOSIS — R972 Elevated prostate specific antigen [PSA]: Secondary | ICD-10-CM

## 2020-06-06 DIAGNOSIS — E559 Vitamin D deficiency, unspecified: Secondary | ICD-10-CM | POA: Diagnosis not present

## 2020-06-06 DIAGNOSIS — N2889 Other specified disorders of kidney and ureter: Secondary | ICD-10-CM

## 2020-06-06 LAB — HEPATIC FUNCTION PANEL
ALT: 32 U/L (ref 0–53)
AST: 32 U/L (ref 0–37)
Albumin: 4.6 g/dL (ref 3.5–5.2)
Alkaline Phosphatase: 51 U/L (ref 39–117)
Bilirubin, Direct: 0.1 mg/dL (ref 0.0–0.3)
Total Bilirubin: 0.5 mg/dL (ref 0.2–1.2)
Total Protein: 7 g/dL (ref 6.0–8.3)

## 2020-06-06 LAB — BASIC METABOLIC PANEL
BUN: 23 mg/dL (ref 6–23)
CO2: 25 mEq/L (ref 19–32)
Calcium: 9.7 mg/dL (ref 8.4–10.5)
Chloride: 105 mEq/L (ref 96–112)
Creatinine, Ser: 1.24 mg/dL (ref 0.40–1.50)
GFR: 57.73 mL/min — ABNORMAL LOW (ref >60.00)
Glucose, Bld: 97 mg/dL (ref 70–99)
Potassium: 5.1 mEq/L (ref 3.5–5.1)
Sodium: 139 mEq/L (ref 135–145)

## 2020-06-06 LAB — VITAMIN D 25 HYDROXY (VIT D DEFICIENCY, FRACTURES): VITD: 62.3 ng/mL (ref 30.00–100.00)

## 2020-06-06 MED ORDER — BELSOMRA 15 MG PO TABS: 1.0000 | ORAL_TABLET | Freq: Every evening | ORAL | 0 refills | Status: DC | PRN

## 2020-06-06 NOTE — Progress Notes (Signed)
Subjective:  Patient ID: Jonathan Young, male    DOB: Apr 16, 1951  Age: 69 y.o. MRN: [id number]  CC: Hypertension  This visit occurred during the SARS-CoV-2 public health emergency.  Safety protocols were in place, including screening questions prior to the visit, additional usage of staff PPE, and extensive cleaning of exam room while observing appropriate contact time as indicated for disinfecting solutions.    HPI KEMO SPRUCE presents for f/up - He came in today for follow-up on PrEP but he tells me he has not been sexually active and is no longer taking PrEP.   He complains of insomnia with frequent awakenings.  The insomnia causes next-day fatigue.  He has not gotten any improvement with melatonin.  Outpatient Medications Prior to Visit  Medication Sig Dispense Refill   ALPRAZolam (XANAX) 0.5 MG tablet TAKE 1 TABLET(0.5 MG) BY MOUTH AT BEDTIME AS NEEDED FOR ANXIETY 30 tablet 1   b complex vitamins tablet Take 1 tablet by mouth daily.     meloxicam (MOBIC) 15 MG tablet TAKE 1 TABLET(15 MG) BY MOUTH DAILY 90 tablet 0   pantoprazole (PROTONIX) 20 MG tablet Take 1 tablet (20 mg total) by mouth daily. 90 tablet 1   rosuvastatin (CRESTOR) 10 MG tablet TAKE 1 TABLET(10 MG) BY MOUTH DAILY 90 tablet 1   tadalafil (ADCIRCA/CIALIS) 20 MG tablet TAKE 1 TABLET BY MOUTH 1 HOUR PRIOR TO INTERCOURSE; MAXIMUM OF 1 TABLET IN 24 HOURS! (Patient taking differently: Take 20 mg by mouth See admin instructions. Take 20 mg by mouth one hour prior to intercourse- maximum of 20 mg (1 tablet) in 24 hours) 10 tablet 5   tamsulosin (FLOMAX) 0.4 MG CAPS capsule Take 0.4 mg by mouth at bedtime.     valACYclovir (VALTREX) 1000 MG tablet Take 0.5 tablets (500 mg total) by mouth daily. 30 tablet 0   vitamin C (ASCORBIC ACID) 500 MG tablet Take 500 mg by mouth daily.     Vitamin D, Ergocalciferol, (DRISDOL) 1.25 MG (50000 UT) CAPS capsule Take 1 capsule (50,000 Units total) by mouth every Saturday. 90 capsule 1    emtricitabine-tenofovir (TRUVADA) 200-300 MG tablet Take 1 tablet by mouth daily. 90 tablet 0   No facility-administered medications prior to visit.    ROS Review of Systems  Constitutional: Positive for fatigue. Negative for diaphoresis.  HENT: Negative.   Eyes: Negative.   Respiratory: Negative for cough, chest tightness, shortness of breath and wheezing.   Cardiovascular: Negative for chest pain, palpitations and leg swelling.  Gastrointestinal: Negative for abdominal pain, constipation, diarrhea, nausea and vomiting.  Endocrine: Negative.   Genitourinary: Negative.  Negative for difficulty urinating.  Musculoskeletal: Negative for myalgias.  Skin: Negative for color change and pallor.  Neurological: Negative.  Negative for dizziness, weakness, light-headedness and headaches.  Hematological: Negative for adenopathy. Does not bruise/bleed easily.  Psychiatric/Behavioral: Positive for sleep disturbance. Negative for decreased concentration, dysphoric mood and suicidal ideas. The patient is not nervous/anxious.     Objective:  BP 124/78 (BP Location: Left Arm, Patient Position: Sitting, Cuff Size: Large)    Pulse 100    Temp 97.9 F (36.6 C) (Oral)    Resp 16    Ht 6\' 1"  (1.854 m)    Wt 200 lb 4 oz (90.8 kg)    SpO2 98%    BMI 26.42 kg/m   BP Readings from Last 3 Encounters:  06/06/20 124/78  03/02/20 120/80  02/04/20 130/83    Wt Readings from Last 3 Encounters:  06/06/20 200 lb 4 oz (90.8 kg)  03/02/20 203 lb (92.1 kg)  09/23/19 201 lb 8 oz (91.4 kg)    Physical Exam Vitals reviewed.  Constitutional:      Appearance: Normal appearance.  HENT:     Nose: Nose normal.     Mouth/Throat:     Mouth: Mucous membranes are moist.  Eyes:     General: No scleral icterus.    Conjunctiva/sclera: Conjunctivae normal.  Cardiovascular:     Rate and Rhythm: Normal rate and regular rhythm.     Heart sounds: No murmur heard.   Pulmonary:     Effort: Pulmonary effort is  normal.     Breath sounds: No stridor. No wheezing, rhonchi or rales.  Abdominal:     General: Abdomen is flat.     Palpations: There is no mass.     Tenderness: There is no abdominal tenderness. There is no guarding.  Musculoskeletal:        General: Normal range of motion.     Cervical back: Neck supple.     Right lower leg: No edema.     Left lower leg: No edema.  Lymphadenopathy:     Cervical: No cervical adenopathy.  Skin:    General: Skin is warm and dry.     Coloration: Skin is not pale.  Neurological:     General: No focal deficit present.     Mental Status: He is alert and oriented to person, place, and time. Mental status is at baseline.  Psychiatric:        Mood and Affect: Mood normal.        Behavior: Behavior normal.        Thought Content: Thought content normal.        Judgment: Judgment normal.     Lab Results  Component Value Date   WBC 5.7 03/02/2020   HGB 14.7 03/02/2020   HCT 43.5 03/02/2020   PLT 208.0 03/02/2020   GLUCOSE 97 06/06/2020   CHOL 187 03/02/2020   TRIG 152.0 (H) 03/02/2020   HDL 45.80 03/02/2020   LDLDIRECT 87.0 04/03/2016   LDLCALC 110 (H) 03/02/2020   ALT 32 06/06/2020   AST 32 06/06/2020   NA 139 06/06/2020   K 5.1 06/06/2020   CL 105 06/06/2020   CREATININE 1.24 06/06/2020   BUN 23 06/06/2020   CO2 25 06/06/2020   TSH 1.57 03/02/2020   PSA 7.8 03/02/2020   INR 1.0 03/02/2020   HGBA1C 5.8 04/03/2016    CT Chest Wo Contrast  Result Date: 02/29/2020 CLINICAL DATA:  Follow-up pulmonary nodule. EXAM: CT CHEST WITHOUT CONTRAST TECHNIQUE: Multidetector CT imaging of the chest was performed following the standard protocol without IV contrast. COMPARISON:  10/28/2019 FINDINGS: Cardiovascular: The heart size is normal. No substantial pericardial effusion. Coronary artery calcification is evident. No thoracic aortic aneurysm. Mediastinum/Nodes: No mediastinal lymphadenopathy. No evidence for gross hilar lymphadenopathy although  assessment is limited by the lack of intravenous contrast on today's study. The esophagus has normal imaging features. There is no axillary lymphadenopathy. Lungs/Pleura: Centrilobular emphsyema noted. 2 mm right middle lobe perifissural nodule on 84/3 is stable. Right middle lobe calcified granuloma again noted. 2-3 mm right middle lobe nodule on 114/3 is unchanged. 6 mm right lower lobe subpleural nodule (133/3) is stable. Calcified granuloma noted posterior right costophrenic sulcus. No suspicious pulmonary nodule or mass in the left lung. No focal airspace consolidation. No pleural effusion. Upper Abdomen: The liver shows diffusely decreased attenuation suggesting  fat deposition. Musculoskeletal: No worrisome lytic or sclerotic osseous abnormality. IMPRESSION: 1. Previously identified tiny calcified and noncalcified right pulmonary nodules are stable in the interval. The posterior right costophrenic sulcus granuloma was not included on the previous exam. No new suspicious pulmonary nodule or mass. 2. Dominant noncalcified nodule is the 6 mm finding in the subpleural right lower lobe, unchanged in the 4 month interval since prior study. 14 -20 month follow-up CT from today is considered optional for low-risk patients, but is recommended for high-risk patients. This recommendation follows the consensus statement: Guidelines for Management of Incidental Pulmonary Nodules Detected on CT Images: From the Fleischner Society 2017; Radiology 2017; 284:228-243. 3. Hepatic steatosis. Electronically Signed   By: Misty Stanley M.D.   On: 02/29/2020 07:57    Assessment & Plan:   Avrey was seen today for hypertension.  Diagnoses and all orders for this visit:  Essential hypertension- His blood pressure is well controlled on no antihypertensives. -     Basic metabolic panel; Future -     Basic metabolic panel  CRI (chronic renal insufficiency), stage 3 (moderate) (HCC)- His renal function has normalized.  He was  encouraged to avoid nephrotoxic agents. -     Basic metabolic panel; Future -     Basic metabolic panel  PSA elevation  High risk homosexual behavior -     Cancel: HIV Antibody (routine testing w rflx); Future -     Cancel: Hepatitis B surface antigen; Future  Vitamin D deficiency -     VITAMIN D 25 Hydroxy (Vit-D Deficiency, Fractures); Future -     VITAMIN D 25 Hydroxy (Vit-D Deficiency, Fractures)  Vitamin B 12 deficiency  Fatty infiltration of liver- His LFTs are normal now. -     Hepatic function panel; Future -     Hepatic function panel  Psychophysiological insomnia -     Suvorexant (BELSOMRA) 15 MG TABS; Take 1 tablet by mouth at bedtime as needed.   I have discontinued Chaska P. Riggan's emtricitabine-tenofovir. I am also having him start on Belsomra. Additionally, I am having him maintain his valACYclovir, tadalafil, b complex vitamins, vitamin C, Vitamin D (Ergocalciferol), pantoprazole, meloxicam, rosuvastatin, ALPRAZolam, and tamsulosin.  Meds ordered this encounter  Medications   Suvorexant (BELSOMRA) 15 MG TABS    Sig: Take 1 tablet by mouth at bedtime as needed.    Dispense:  90 tablet    Refill:  0     Follow-up: Return in about 6 months (around 12/06/2020).  Scarlette Calico, MD

## 2020-06-06 NOTE — Patient Instructions (Signed)

## 2020-06-06 NOTE — Telephone Encounter (Signed)
New message  The patient was seen today wanted to know can Vitamin D level added to current lab order.

## 2020-06-24 ENCOUNTER — Other Ambulatory Visit (HOSPITAL_COMMUNITY): Payer: PPO

## 2020-07-03 DIAGNOSIS — M7130 Other bursal cyst, unspecified site: Secondary | ICD-10-CM | POA: Diagnosis not present

## 2020-07-03 DIAGNOSIS — L819 Disorder of pigmentation, unspecified: Secondary | ICD-10-CM | POA: Diagnosis not present

## 2020-07-03 DIAGNOSIS — L739 Follicular disorder, unspecified: Secondary | ICD-10-CM | POA: Diagnosis not present

## 2020-07-03 DIAGNOSIS — R972 Elevated prostate specific antigen [PSA]: Secondary | ICD-10-CM | POA: Diagnosis not present

## 2020-07-03 DIAGNOSIS — L820 Inflamed seborrheic keratosis: Secondary | ICD-10-CM | POA: Diagnosis not present

## 2020-07-03 DIAGNOSIS — L738 Other specified follicular disorders: Secondary | ICD-10-CM | POA: Diagnosis not present

## 2020-07-04 ENCOUNTER — Other Ambulatory Visit: Payer: Self-pay | Admitting: Internal Medicine

## 2020-07-04 DIAGNOSIS — R3912 Poor urinary stream: Secondary | ICD-10-CM | POA: Diagnosis not present

## 2020-07-04 DIAGNOSIS — R972 Elevated prostate specific antigen [PSA]: Secondary | ICD-10-CM | POA: Diagnosis not present

## 2020-07-04 DIAGNOSIS — N401 Enlarged prostate with lower urinary tract symptoms: Secondary | ICD-10-CM | POA: Diagnosis not present

## 2020-07-04 DIAGNOSIS — K219 Gastro-esophageal reflux disease without esophagitis: Secondary | ICD-10-CM

## 2020-07-10 ENCOUNTER — Other Ambulatory Visit: Payer: Self-pay | Admitting: Internal Medicine

## 2020-07-10 DIAGNOSIS — N529 Male erectile dysfunction, unspecified: Secondary | ICD-10-CM

## 2020-07-12 ENCOUNTER — Other Ambulatory Visit: Payer: Self-pay | Admitting: Urology

## 2020-07-12 ENCOUNTER — Other Ambulatory Visit (HOSPITAL_COMMUNITY): Payer: Self-pay | Admitting: Urology

## 2020-07-12 DIAGNOSIS — R972 Elevated prostate specific antigen [PSA]: Secondary | ICD-10-CM

## 2020-07-19 ENCOUNTER — Other Ambulatory Visit: Payer: Self-pay | Admitting: Internal Medicine

## 2020-07-19 DIAGNOSIS — E559 Vitamin D deficiency, unspecified: Secondary | ICD-10-CM

## 2020-07-21 ENCOUNTER — Encounter (HOSPITAL_BASED_OUTPATIENT_CLINIC_OR_DEPARTMENT_OTHER): Payer: Self-pay | Admitting: Surgery

## 2020-07-21 ENCOUNTER — Other Ambulatory Visit: Payer: Self-pay

## 2020-07-21 DIAGNOSIS — Z96642 Presence of left artificial hip joint: Secondary | ICD-10-CM | POA: Diagnosis not present

## 2020-07-21 DIAGNOSIS — M25551 Pain in right hip: Secondary | ICD-10-CM | POA: Diagnosis not present

## 2020-07-21 DIAGNOSIS — Z96643 Presence of artificial hip joint, bilateral: Secondary | ICD-10-CM | POA: Diagnosis not present

## 2020-07-21 DIAGNOSIS — M25552 Pain in left hip: Secondary | ICD-10-CM | POA: Diagnosis not present

## 2020-07-24 ENCOUNTER — Encounter (HOSPITAL_BASED_OUTPATIENT_CLINIC_OR_DEPARTMENT_OTHER)
Admission: RE | Admit: 2020-07-24 | Discharge: 2020-07-24 | Disposition: A | Discharge status: Home / Self Care | Payer: PPO | Source: Ambulatory Visit | Attending: Surgery | Admitting: Surgery

## 2020-07-24 ENCOUNTER — Other Ambulatory Visit (HOSPITAL_COMMUNITY)
Admission: RE | Admit: 2020-07-24 | Discharge: 2020-07-24 | Disposition: A | Discharge status: Home / Self Care | Payer: PPO | Source: Ambulatory Visit | Attending: Surgery | Admitting: Surgery

## 2020-07-24 DIAGNOSIS — Z01812 Encounter for preprocedural laboratory examination: Secondary | ICD-10-CM | POA: Insufficient documentation

## 2020-07-24 DIAGNOSIS — Z20822 Contact with and (suspected) exposure to covid-19: Secondary | ICD-10-CM | POA: Diagnosis not present

## 2020-07-24 LAB — CBC WITH DIFFERENTIAL/PLATELET
Abs Immature Granulocytes: 0.02 10*3/uL (ref 0.00–0.07)
Basophils Absolute: 0 10*3/uL (ref 0.0–0.1)
Basophils Relative: 1 %
Eosinophils Absolute: 0.3 10*3/uL (ref 0.0–0.5)
Eosinophils Relative: 5 %
HCT: 43.7 % (ref 39.0–52.0)
Hemoglobin: 14.3 g/dL (ref 13.0–17.0)
Immature Granulocytes: 0 %
Lymphocytes Relative: 34 %
Lymphs Abs: 2.1 10*3/uL (ref 0.7–4.0)
MCH: 31 pg (ref 26.0–34.0)
MCHC: 32.7 g/dL (ref 30.0–36.0)
MCV: 94.8 fL (ref 80.0–100.0)
Monocytes Absolute: 0.6 10*3/uL (ref 0.1–1.0)
Monocytes Relative: 9 %
Neutro Abs: 3.2 10*3/uL (ref 1.7–7.7)
Neutrophils Relative %: 51 %
Platelets: 237 10*3/uL (ref 150–400)
RBC: 4.61 MIL/uL (ref 4.22–5.81)
RDW: 12.8 % (ref 11.5–15.5)
WBC: 6.3 10*3/uL (ref 4.0–10.5)
nRBC: 0 % (ref 0.0–0.2)

## 2020-07-24 LAB — COMPREHENSIVE METABOLIC PANEL
ALT: 33 U/L (ref 0–44)
AST: 35 U/L (ref 15–41)
Albumin: 4.1 g/dL (ref 3.5–5.0)
Alkaline Phosphatase: 48 U/L (ref 38–126)
Anion gap: 10 (ref 5–15)
BUN: 16 mg/dL (ref 8–23)
CO2: 23 mmol/L (ref 22–32)
Calcium: 9.4 mg/dL (ref 8.9–10.3)
Chloride: 107 mmol/L (ref 98–111)
Creatinine, Ser: 1.21 mg/dL (ref 0.61–1.24)
GFR calc Af Amer: >60 mL/min (ref >60)
GFR calc non Af Amer: >60 mL/min (ref >60)
Glucose, Bld: 111 mg/dL — ABNORMAL HIGH (ref 70–99)
Potassium: 5.1 mmol/L (ref 3.5–5.1)
Sodium: 140 mmol/L (ref 135–145)
Total Bilirubin: 0.9 mg/dL (ref 0.3–1.2)
Total Protein: 6.5 g/dL (ref 6.5–8.1)

## 2020-07-24 LAB — SARS CORONAVIRUS 2 (TAT 6-24 HRS): SARS Coronavirus 2: NEGATIVE

## 2020-07-24 NOTE — Progress Notes (Signed)

## 2020-07-25 ENCOUNTER — Ambulatory Visit (HOSPITAL_COMMUNITY)
Admission: RE | Admit: 2020-07-25 | Discharge: 2020-07-25 | Disposition: A | Discharge status: Home / Self Care | Payer: PPO | Source: Ambulatory Visit | Attending: Urology | Admitting: Urology

## 2020-07-25 ENCOUNTER — Other Ambulatory Visit: Payer: Self-pay

## 2020-07-25 DIAGNOSIS — R972 Elevated prostate specific antigen [PSA]: Secondary | ICD-10-CM | POA: Diagnosis not present

## 2020-07-25 DIAGNOSIS — N402 Nodular prostate without lower urinary tract symptoms: Secondary | ICD-10-CM | POA: Diagnosis not present

## 2020-07-25 MED ORDER — GADOBUTROL 1 MMOL/ML IV SOLN
8.0000 mL | Freq: Once | INTRAVENOUS | Status: AC | PRN
  Administered 2020-07-25: 8 mL via INTRAVENOUS

## 2020-07-26 NOTE — Anesthesia Preprocedure Evaluation (Addendum)
Anesthesia Evaluation  Patient identified by MRN, date of birth, ID band Patient awake    Reviewed: Allergy & Precautions, NPO status , Patient's Chart, lab work & pertinent test results  History of Anesthesia Complications (+) history of anesthetic complications  Airway Mallampati: I       Dental  (+) Teeth Intact, Caps   Pulmonary former smoker,    Pulmonary exam normal        Cardiovascular Normal cardiovascular exam Rhythm:Regular Rate:Normal     Neuro/Psych PSYCHIATRIC DISORDERS Anxiety Depression    GI/Hepatic GERD  Medicated,  Endo/Other    Renal/GU   negative genitourinary   Musculoskeletal  (+) Arthritis , Osteoarthritis,    Abdominal Normal abdominal exam  (+)   Peds  Hematology   Anesthesia Other Findings   Reproductive/Obstetrics                            Anesthesia Physical Anesthesia Plan  ASA: II  Anesthesia Plan: General   Post-op Pain Management:    Induction: Intravenous  PONV Risk Score and Plan: 3 and Ondansetron, Dexamethasone and Midazolam  Airway Management Planned: Oral ETT  Additional Equipment: None  Intra-op Plan:   Post-operative Plan: Extubation in OR  Informed Consent: I have reviewed the patients History and Physical, chart, labs and discussed the procedure including the risks, benefits and alternatives for the proposed anesthesia with the patient or authorized representative who has indicated his/her understanding and acceptance.     Dental advisory given  Plan Discussed with: CRNA  Anesthesia Plan Comments:        Anesthesia Quick Evaluation

## 2020-07-27 ENCOUNTER — Ambulatory Visit (HOSPITAL_BASED_OUTPATIENT_CLINIC_OR_DEPARTMENT_OTHER)
Admission: RE | Admit: 2020-07-27 | Discharge: 2020-07-27 | Disposition: A | Discharge status: Home / Self Care | Payer: PPO | Attending: Surgery | Admitting: Surgery

## 2020-07-27 ENCOUNTER — Encounter (HOSPITAL_BASED_OUTPATIENT_CLINIC_OR_DEPARTMENT_OTHER): Payer: Self-pay | Admitting: Surgery

## 2020-07-27 ENCOUNTER — Other Ambulatory Visit: Payer: Self-pay

## 2020-07-27 ENCOUNTER — Encounter (HOSPITAL_BASED_OUTPATIENT_CLINIC_OR_DEPARTMENT_OTHER)
Admission: RE | Disposition: A | Discharge status: Home / Self Care | Payer: Self-pay | Source: Home / Self Care | Attending: Surgery

## 2020-07-27 ENCOUNTER — Ambulatory Visit (HOSPITAL_BASED_OUTPATIENT_CLINIC_OR_DEPARTMENT_OTHER): Payer: PPO | Admitting: Certified Registered"

## 2020-07-27 DIAGNOSIS — M6208 Separation of muscle (nontraumatic), other site: Secondary | ICD-10-CM | POA: Insufficient documentation

## 2020-07-27 DIAGNOSIS — K429 Umbilical hernia without obstruction or gangrene: Secondary | ICD-10-CM | POA: Insufficient documentation

## 2020-07-27 DIAGNOSIS — F419 Anxiety disorder, unspecified: Secondary | ICD-10-CM | POA: Diagnosis not present

## 2020-07-27 DIAGNOSIS — Z87891 Personal history of nicotine dependence: Secondary | ICD-10-CM | POA: Insufficient documentation

## 2020-07-27 DIAGNOSIS — N183 Chronic kidney disease, stage 3 unspecified: Secondary | ICD-10-CM | POA: Diagnosis not present

## 2020-07-27 DIAGNOSIS — E7800 Pure hypercholesterolemia, unspecified: Secondary | ICD-10-CM | POA: Diagnosis not present

## 2020-07-27 DIAGNOSIS — Z836 Family history of other diseases of the respiratory system: Secondary | ICD-10-CM | POA: Diagnosis not present

## 2020-07-27 DIAGNOSIS — M1990 Unspecified osteoarthritis, unspecified site: Secondary | ICD-10-CM | POA: Insufficient documentation

## 2020-07-27 DIAGNOSIS — K219 Gastro-esophageal reflux disease without esophagitis: Secondary | ICD-10-CM | POA: Insufficient documentation

## 2020-07-27 DIAGNOSIS — I129 Hypertensive chronic kidney disease with stage 1 through stage 4 chronic kidney disease, or unspecified chronic kidney disease: Secondary | ICD-10-CM | POA: Diagnosis not present

## 2020-07-27 DIAGNOSIS — Z79899 Other long term (current) drug therapy: Secondary | ICD-10-CM | POA: Diagnosis not present

## 2020-07-27 DIAGNOSIS — F329 Major depressive disorder, single episode, unspecified: Secondary | ICD-10-CM | POA: Diagnosis not present

## 2020-07-27 HISTORY — PX: INSERTION OF MESH: SHX5868

## 2020-07-27 HISTORY — DX: Other complications of anesthesia, initial encounter: T88.59XA

## 2020-07-27 HISTORY — DX: Umbilical hernia without obstruction or gangrene: K42.9

## 2020-07-27 HISTORY — PX: UMBILICAL HERNIA REPAIR: SHX196

## 2020-07-27 SURGERY — REPAIR, HERNIA, UMBILICAL, ADULT
Anesthesia: General | Site: Abdomen

## 2020-07-27 MED ORDER — ONDANSETRON HCL 4 MG/2ML IJ SOLN: 4.0000 mg | Freq: Once | INTRAMUSCULAR | Status: DC | PRN

## 2020-07-27 MED ORDER — BUPIVACAINE HCL (PF) 0.25 % IJ SOLN
INTRAMUSCULAR | Status: AC
  Filled 2020-07-27: qty 180

## 2020-07-27 MED ORDER — ACETAMINOPHEN 500 MG PO TABS
ORAL_TABLET | ORAL | Status: AC
  Filled 2020-07-27: qty 2

## 2020-07-27 MED ORDER — FENTANYL CITRATE (PF) 100 MCG/2ML IJ SOLN
INTRAMUSCULAR | Status: AC
  Filled 2020-07-27: qty 2

## 2020-07-27 MED ORDER — CEFAZOLIN SODIUM-DEXTROSE 2-4 GM/100ML-% IV SOLN
INTRAVENOUS | Status: AC
  Filled 2020-07-27: qty 100

## 2020-07-27 MED ORDER — KETOROLAC TROMETHAMINE 30 MG/ML IJ SOLN: 30.0000 mg | Freq: Once | INTRAMUSCULAR | Status: DC | PRN

## 2020-07-27 MED ORDER — HEPARIN (PORCINE) IN NACL 1000-0.9 UT/500ML-% IV SOLN
INTRAVENOUS | Status: AC
  Filled 2020-07-27: qty 500

## 2020-07-27 MED ORDER — ROCURONIUM BROMIDE 100 MG/10ML IV SOLN
INTRAVENOUS | Status: DC | PRN
  Administered 2020-07-27: 70 mg via INTRAVENOUS

## 2020-07-27 MED ORDER — PROPOFOL 500 MG/50ML IV EMUL
INTRAVENOUS | Status: AC
  Filled 2020-07-27: qty 50

## 2020-07-27 MED ORDER — FENTANYL CITRATE (PF) 100 MCG/2ML IJ SOLN
INTRAMUSCULAR | Status: DC | PRN
  Administered 2020-07-27: 100 ug via INTRAVENOUS

## 2020-07-27 MED ORDER — IBUPROFEN 800 MG PO TABS
800.0000 mg | ORAL_TABLET | Freq: Three times a day (TID) | ORAL | 0 refills | Status: DC | PRN
Start: 2020-07-27 — End: 2020-11-14

## 2020-07-27 MED ORDER — MEPERIDINE HCL 25 MG/ML IJ SOLN: 6.2500 mg | INTRAMUSCULAR | Status: DC | PRN

## 2020-07-27 MED ORDER — CELECOXIB 200 MG PO CAPS
200.0000 mg | ORAL_CAPSULE | Freq: Once | ORAL | Status: AC
  Administered 2020-07-27: 200 mg via ORAL

## 2020-07-27 MED ORDER — LIDOCAINE 2% (20 MG/ML) 5 ML SYRINGE
INTRAMUSCULAR | Status: DC | PRN
  Administered 2020-07-27: 100 mg via INTRAVENOUS

## 2020-07-27 MED ORDER — DEXAMETHASONE SODIUM PHOSPHATE 10 MG/ML IJ SOLN
INTRAMUSCULAR | Status: DC | PRN
  Administered 2020-07-27: 5 mg via INTRAVENOUS

## 2020-07-27 MED ORDER — SUGAMMADEX SODIUM 200 MG/2ML IV SOLN
INTRAVENOUS | Status: DC | PRN
  Administered 2020-07-27: 400 mg via INTRAVENOUS

## 2020-07-27 MED ORDER — ACETAMINOPHEN 500 MG PO TABS
1000.0000 mg | ORAL_TABLET | Freq: Once | ORAL | Status: AC
  Administered 2020-07-27: 1000 mg via ORAL

## 2020-07-27 MED ORDER — DEXAMETHASONE SODIUM PHOSPHATE 10 MG/ML IJ SOLN
INTRAMUSCULAR | Status: AC
  Filled 2020-07-27: qty 1

## 2020-07-27 MED ORDER — CEFAZOLIN SODIUM-DEXTROSE 2-4 GM/100ML-% IV SOLN
2.0000 g | Freq: Once | INTRAVENOUS | Status: AC
  Administered 2020-07-27: 2 g via INTRAVENOUS

## 2020-07-27 MED ORDER — SODIUM CHLORIDE (PF) 0.9 % IJ SOLN
INTRAMUSCULAR | Status: AC
  Filled 2020-07-27: qty 20

## 2020-07-27 MED ORDER — HYDROCODONE-ACETAMINOPHEN 5-325 MG PO TABS
1.0000 | ORAL_TABLET | Freq: Four times a day (QID) | ORAL | 0 refills | Status: DC | PRN
Start: 2020-07-27 — End: 2020-11-14

## 2020-07-27 MED ORDER — ONDANSETRON HCL 4 MG/2ML IJ SOLN
INTRAMUSCULAR | Status: AC
  Filled 2020-07-27: qty 2

## 2020-07-27 MED ORDER — BUPIVACAINE HCL (PF) 0.25 % IJ SOLN
INTRAMUSCULAR | Status: DC | PRN
  Administered 2020-07-27: 9 mL

## 2020-07-27 MED ORDER — PROPOFOL 10 MG/ML IV BOLUS
INTRAVENOUS | Status: DC | PRN
  Administered 2020-07-27: 200 mg via INTRAVENOUS

## 2020-07-27 MED ORDER — LIDOCAINE 2% (20 MG/ML) 5 ML SYRINGE
INTRAMUSCULAR | Status: AC
  Filled 2020-07-27: qty 5

## 2020-07-27 MED ORDER — LACTATED RINGERS IV SOLN: INTRAVENOUS | Status: DC

## 2020-07-27 MED ORDER — MIDAZOLAM HCL 2 MG/2ML IJ SOLN
INTRAMUSCULAR | Status: AC
  Filled 2020-07-27: qty 2

## 2020-07-27 MED ORDER — ROCURONIUM BROMIDE 10 MG/ML (PF) SYRINGE
PREFILLED_SYRINGE | INTRAVENOUS | Status: AC
  Filled 2020-07-27: qty 10

## 2020-07-27 MED ORDER — ONDANSETRON HCL 4 MG/2ML IJ SOLN
INTRAMUSCULAR | Status: DC | PRN
  Administered 2020-07-27: 4 mg via INTRAVENOUS

## 2020-07-27 MED ORDER — HEPARIN SOD (PORK) LOCK FLUSH 100 UNIT/ML IV SOLN
INTRAVENOUS | Status: AC
  Filled 2020-07-27: qty 5

## 2020-07-27 MED ORDER — MIDAZOLAM HCL 5 MG/5ML IJ SOLN
INTRAMUSCULAR | Status: DC | PRN
  Administered 2020-07-27 (×2): 1 mg via INTRAVENOUS

## 2020-07-27 MED ORDER — FENTANYL CITRATE (PF) 100 MCG/2ML IJ SOLN: 25.0000 ug | INTRAMUSCULAR | Status: DC | PRN

## 2020-07-27 MED ORDER — METHYLENE BLUE 0.5 % INJ SOLN
INTRAVENOUS | Status: AC
  Filled 2020-07-27: qty 20

## 2020-07-27 MED ORDER — CELECOXIB 200 MG PO CAPS
ORAL_CAPSULE | ORAL | Status: AC
  Filled 2020-07-27: qty 1

## 2020-07-27 SURGICAL SUPPLY — 34 items
ADH SKN CLS APL DERMABOND .7 (GAUZE/BANDAGES/DRESSINGS) ×1
APL PRP STRL LF DISP 70% ISPRP (MISCELLANEOUS) ×1
BLADE CLIPPER SURG (BLADE) ×3 IMPLANT
BLADE SURG 15 STRL LF DISP TIS (BLADE) ×1 IMPLANT
BLADE SURG 15 STRL SS (BLADE) ×3
CHLORAPREP W/TINT 26 (MISCELLANEOUS) ×3 IMPLANT
COVER BACK TABLE 60X90IN (DRAPES) ×3 IMPLANT
COVER MAYO STAND STRL (DRAPES) ×3 IMPLANT
DERMABOND ADVANCED (GAUZE/BANDAGES/DRESSINGS) ×2
DERMABOND ADVANCED .7 DNX12 (GAUZE/BANDAGES/DRESSINGS) ×1 IMPLANT
DRAPE LAPAROTOMY TRNSV 102X78 (DRAPES) ×3 IMPLANT
DRAPE UTILITY XL STRL (DRAPES) ×3 IMPLANT
ELECT COATED BLADE 2.86 ST (ELECTRODE) ×3 IMPLANT
ELECT REM PT RETURN 9FT ADLT (ELECTROSURGICAL) ×3
ELECTRODE REM PT RTRN 9FT ADLT (ELECTROSURGICAL) ×1 IMPLANT
GLOVE BIO SURGEON STRL SZ 6.5 (GLOVE) ×2 IMPLANT
GLOVE BIO SURGEONS STRL SZ 6.5 (GLOVE) ×1
GLOVE BIOGEL PI IND STRL 8 (GLOVE) ×1 IMPLANT
GLOVE BIOGEL PI INDICATOR 8 (GLOVE) ×2
GLOVE ECLIPSE 8.0 STRL XLNG CF (GLOVE) ×3 IMPLANT
GOWN STRL REUS W/ TWL LRG LVL3 (GOWN DISPOSABLE) ×2 IMPLANT
GOWN STRL REUS W/TWL LRG LVL3 (GOWN DISPOSABLE) ×6
MESH VENTRALEX ST 1-7/10 CRC S (Mesh General) ×3 IMPLANT
NEEDLE HYPO 25X1 1.5 SAFETY (NEEDLE) ×3 IMPLANT
PACK BASIN DAY SURGERY FS (CUSTOM PROCEDURE TRAY) ×3 IMPLANT
PENCIL SMOKE EVACUATOR (MISCELLANEOUS) ×3 IMPLANT
SLEEVE SCD COMPRESS KNEE MED (MISCELLANEOUS) ×3 IMPLANT
SUT MON AB 4-0 PC3 18 (SUTURE) ×3 IMPLANT
SUT NOVA NAB DX-16 0-1 5-0 T12 (SUTURE) ×6 IMPLANT
SUT VIC AB 2-0 SH 27 (SUTURE) ×3
SUT VIC AB 2-0 SH 27XBRD (SUTURE) ×1 IMPLANT
SUT VICRYL 3-0 CR8 SH (SUTURE) ×3 IMPLANT
SYR CONTROL 10ML LL (SYRINGE) ×3 IMPLANT
TOWEL GREEN STERILE FF (TOWEL DISPOSABLE) ×3 IMPLANT

## 2020-07-27 NOTE — Transfer of Care (Signed)
Immediate Anesthesia Transfer of Care Note  Patient: Jonathan Young  Procedure(s) Performed: UMBILICAL HERNIA REPAIR WITH MESH (N/A Abdomen) INSERTION OF MESH (N/A Abdomen)  Patient Location: PACU  Anesthesia Type:General  Level of Consciousness: awake, alert  and oriented  Airway & Oxygen Therapy: Patient Spontanous Breathing and Patient connected to face mask oxygen  Post-op Assessment: Report given to RN and Post -op Vital signs reviewed and stable  Post vital signs: Reviewed and stable  Last Vitals:  Vitals Value Taken Time  BP 138/78 07/27/20 0827  Temp    Pulse 75 07/27/20 0829  Resp 18 07/27/20 0829  SpO2 100 % 07/27/20 0829  Vitals shown include unvalidated device data.  Last Pain:  Vitals:   07/27/20 0650  TempSrc: Oral  PainSc: 0-No pain         Complications: No complications documented.

## 2020-07-27 NOTE — Anesthesia Procedure Notes (Signed)
Procedure Name: Intubation Date/Time: 07/27/2020 7:45 AM Performed by: Lavonia Dana, CRNA Pre-anesthesia Checklist: Patient identified, Emergency Drugs available, Suction available and Patient being monitored Patient Re-evaluated:Patient Re-evaluated prior to induction Oxygen Delivery Method: Circle system utilized Preoxygenation: Pre-oxygenation with 100% oxygen Induction Type: IV induction Ventilation: Mask ventilation without difficulty and Oral airway inserted - appropriate to patient size Laryngoscope Size: Mac and 4 Grade View: Grade II Tube type: Oral Tube size: 7.5 mm Number of attempts: 1 Airway Equipment and Method: Stylet,  Bite block and Oral airway Placement Confirmation: ETT inserted through vocal cords under direct vision,  positive ETCO2 and breath sounds checked- equal and bilateral Secured at: 23 cm Tube secured with: Tape Dental Injury: Teeth and Oropharynx as per pre-operative assessment  Comments: Grade IIa view

## 2020-07-27 NOTE — Anesthesia Postprocedure Evaluation (Signed)
Anesthesia Post Note  Patient: Jonathan Young  Procedure(s) Performed: UMBILICAL HERNIA REPAIR WITH MESH (N/A Abdomen) INSERTION OF MESH (N/A Abdomen)     Patient location during evaluation: Phase II Anesthesia Type: General Level of consciousness: awake Pain management: pain level controlled Vital Signs Assessment: post-procedure vital signs reviewed and stable Respiratory status: spontaneous breathing Cardiovascular status: stable Postop Assessment: no apparent nausea or vomiting Anesthetic complications: no   No complications documented.  Last Vitals:  Vitals:   07/27/20 0900 07/27/20 0915  BP: 126/70 129/71  Pulse: 60 69  Resp: 10 18  Temp:  36.6 C  SpO2: 99% 98%    Last Pain:  Vitals:   07/27/20 0915  TempSrc:   PainSc: [name]

## 2020-07-27 NOTE — H&P (Signed)
Jonathan Young Location: [facility] Patient #: [id number] DOB: April 14, 1951 Single / Language: [name] / Race: White Male  History of Present Illness Patient words: Patient presents for evaluation of abdominal wall bulge. Location is in the midline. He also has a small umbilical hernia. As causing him discomfort. It does pop and pop out. No change in bowel or bladder function.  The patient is a 69 year old male.   Past Surgical History  Hemorrhoidectomy Hip Surgery Bilateral. Shoulder Surgery Bilateral. Spinal Surgery - Neck Tonsillectomy  Diagnostic Studies History  AM) Colonoscopy 1-5 years ago  Allergies ([name] No Known Drug Allergies [04/17/2020]: Allergies Reconciled  Medication History ([name], C Tamsulosin HCl (0.4MG  Capsule, Oral) Active. B Complex (Oral) Active. Vitamin C (500MG  Tablet Chewable, Oral) Active. Pantoprazole Sodium (20MG  Tablet DR, Oral) Active. Rosuvastatin Calcium (10MG  Tablet, Oral) Active. CoQ-10 (Oral) Specific strength unknown - Active. valACYclovir HCl (1GM Tablet, Oral) Active. Medications Reconciled  Social History Leisure centre manager, [name] Alcohol use Moderate alcohol use. Caffeine use Coffee. Tobacco use Former smoker.  Family History ([name], Respiratory Condition Mother.  Other Problems ([name], CMBack Pain Gastroesophageal Reflux Disease Hypercholesterolemia Umbilical Hernia Repair Ventral Hernia Repair     Review of Systems ([name] [name] MD;  General Present- Fatigue. Not Present- Appetite Loss, Chills, Fever, Night Sweats, Weight Gain and Weight Loss. Skin Present- Dryness. Not Present- Change in Wart/Mole, Hives, Jaundice, New Lesions, Non-Healing Wounds, Rash and Ulcer. HEENT Present- Ringing in the Ears. Not Present- Earache, Hearing Loss, Hoarseness, Nose Bleed, Oral Ulcers, Seasonal Allergies, Sinus Pain, Sore Throat, Visual Disturbances, Wears  glasses/contact lenses and Yellow Eyes. Respiratory Not Present- Bloody sputum, Chronic Cough, Difficulty Breathing, Snoring and Wheezing. Breast Not Present- Breast Mass, Breast Pain, Nipple Discharge and Skin Changes. Cardiovascular Present- Shortness of Breath. Not Present- Chest Pain, Difficulty Breathing Lying Down, Leg Cramps, Palpitations, Rapid Heart Rate and Swelling of Extremities. Gastrointestinal Present- Excessive gas. Not Present- Abdominal Pain, Bloating, Bloody Stool, Change in Bowel Habits, Chronic diarrhea, Constipation, Difficulty Swallowing, Gets full quickly at meals, Hemorrhoids, Indigestion, Nausea, Rectal Pain and Vomiting. Male Genitourinary Present- Frequency. Not Present- Blood in Urine, Change in Urinary Stream, Impotence, Nocturia, Painful Urination, Urgency and Urine Leakage. Musculoskeletal Present- Joint Stiffness. Not Present- Back Pain, Joint Pain, Muscle Pain, Muscle Weakness and Swelling of Extremities. Neurological Not Present- Decreased Memory, Fainting, Headaches, Numbness, Seizures, Tingling, Tremor, Trouble walking and Weakness. Psychiatric Not Present- Anxiety, Bipolar, Change in Sleep Pattern, Depression, Fearful and Frequent crying. Endocrine Not Present- Cold Intolerance, Excessive Hunger, Hair Changes, Heat Intolerance, Hot flashes and New Diabetes. Hematology Not Present- Blood Thinners, Easy Bruising, Excessive bleeding, Gland problems, HIV and Persistent Infections. All other systems negative  Vitals  04/17/2020 11:09 AM Weight: 204.5 lb Height: 71in Body Surface Area: 2.13 m Body Mass Index: 28.52 kg/m  Temp.: 62F  Pulse: 99 (Regular)  BP: 120/72(Sitting, Left Arm, Standard)        Physical Exam   General Mental Status-Alert. General Appearance-Consistent with stated age. Hydration-Well hydrated. Voice-Normal.  Chest and Lung Exam Chest and lung exam reveals -quiet, even and easy respiratory effort  with no use of accessory muscles and on auscultation, normal breath sounds, no adventitious sounds and normal vocal resonance. Inspection Chest Wall - Normal. Back - normal.  Cardiovascular Cardiovascular examination reveals -normal heart sounds, regular rate and rhythm with no murmurs and normal pedal pulses bilaterally.  Abdomen Note: Small reducible umbilical hernia. Rectus diastases noted. Soft nontender without rebound or guarding  Neurologic Neurologic evaluation reveals -alert and oriented x 3 with no impairment of recent or remote memory. Mental Status-Normal.  Musculoskeletal Normal Exam - Left-Upper Extremity Strength Normal and Lower Extremity Strength Normal. Normal Exam - Right-Upper Extremity Strength Normal and Lower Extremity Strength Normal.    Assessment & Plan   UMBILICAL HERNIA WITHOUT OBSTRUCTION AND WITHOUT GANGRENE (K42.9) Impression: Discuss repair of his umbilical hernia with mesh. Risks, benefits and other treatment options discussed as well as observation. He is having mild to moderate symptoms of discomfort at the umbilicus and desires repair. He has a significant component of rectus diastases switches not amendable surgical treatment and is considered cosmetic which I discussed with him. The risk of hernia repair include bleeding, infection, organ injury, bowel injury, bladder injury, nerve injury recurrent hernia, blood clots, worsening of underlying condition, chronic pain, mesh use, open surgery, death, and the need for other operations. Pt agrees to proceed   total time 40 min for examination, review of chart, documentation  Current Plans You are being scheduled for surgery- Our schedulers will call you.  You should hear from our office's scheduling department within 5 working days about the location, date, and time of surgery. We try to make accommodations for patient's preferences in scheduling surgery, but sometimes the OR  schedule or the surgeon's schedule prevents [place] from making those accommodations.  If you have not heard from our office [phone]) in 5 working days, call the office and ask for your surgeon's nurse.  If you have other questions about your diagnosis, plan, or surgery, call the office and ask for your surgeon's nurse.  Pt Education - Pamphlet Given - Hernia Surgery: discussed with patient and provided information. Pt Education - CCS Mesh education: discussed with patient and provided information. CCS Consent - Hernia Repair - Ventral/Incisional/Umbilical (Gross): discussed with patient and provided information.  RECTUS DIASTASIS (M62.08) Impression: Discussed pathophysiology and the fact this is not a surgical disease but can be corrected cosmetically if desired

## 2020-07-27 NOTE — Op Note (Signed)
Jonathan Young Sep 07, 1951 [id number] 07/27/2020  Preoperative diagnosis: Umbilical hernia reducible without obstruction  Postoperative diagnosis: Same  Procedure: Umbilical Hernia Repair 4.3 cm circular coated ventral light mesh  Surgeon: Erroll Luna, MD, FACS  Anesthesia: General and 0.25 percent Marcaine local   Clinical History and Indications: Patient presents for repair of symptomatic enlarging umbilical hernia.The risk of hernia repair include bleeding,  Infection,   Recurrence of the hernia,  Mesh use, chronic pain,  Organ injury,  Bowel injury,  Bladder injury,   nerve injury with numbness around the incision,  Death,  and worsening of preexisting  medical problems.  The alternatives to surgery have been discussed as well..  Long term expectations of both operative and non operative treatments have been discussed.   The patient agrees to proceed.  Procedure: The patient was seen in the preoperative area and the plans for the procedure reviewed again. He  had no further questions. I marked the area of the umbilicus as the operative site. He wishes to prodeed.  The patient was taken to the operating room and after satisfactory general anesthesia had been obtained the area was clipped as needed, prepped and draped. The timeout was performed.  I used some 0.25 percent Marcaine anesthesia to help with postoperative pain management. This was infiltrated around the umbilical area and additional infiltrated as I worked.  A curvilinear incision was made on the inferior aspect of the umbilicus. The umbilical skin was elevated off of the hernia sac. The hernia sac was dissected free of the subcutaneous tissues.  Hernia defect was dissected out.  This was about 2 cm.  A 4.3 cm circular ventral light mesh was placed in a subfascial position in the preperitoneal space and secured with #1 Novafil circumferentially.  The fascial defect was closed with #1 Novafil.  Hemostasis was excellent.  Once the  repair was complete the incision was closed by using some 3-0 Vicryl subcutaneous and 4-0 Monocryl subcuticular sutures.  The patient tolerated the procedure well. There were no operative complications. There was minimal blood loss. All counts were correct. He was taken to the PACU in satisfactory condition.  Erroll Luna, MD, FACS 07/27/2020 8:29 AM

## 2020-07-27 NOTE — Discharge Instructions (Signed)
[facility] _______[facility]  UMBILICAL OR INGUINAL HERNIA REPAIR: POST OP INSTRUCTIONS  Always review your discharge instruction sheet given to you by the facility where your surgery was performed. IF YOU HAVE DISABILITY OR FAMILY LEAVE FORMS, YOU MUST BRING THEM TO THE OFFICE FOR PROCESSING.   DO NOT GIVE THEM TO YOUR DOCTOR.  1. A  prescription for pain medication may be given to you upon discharge.  Take your pain medication as prescribed, if needed.  If narcotic pain medicine is not needed, then you may take acetaminophen (Tylenol) or ibuprofen (Advil) as needed. No Tylenol or Ibuprofen until 1:30pm as needed. 2. Take your usually prescribed medications unless otherwise directed. If you need a refill on your pain medication, please contact your pharmacy.  They will contact our office to request authorization. Prescriptions will not be filled after 5 pm or on week-ends. 3. You should follow a light diet the first 24 hours after arrival home, such as soup and crackers, etc.  Be sure to include lots of fluids daily.  Resume your normal diet the day after surgery. 4.Most patients will experience some swelling and bruising around the umbilicus or in the groin and scrotum.  Ice packs and reclining will help.  Swelling and bruising can take several days to resolve.  6. It is common to experience some constipation if taking pain medication after surgery.  Increasing fluid intake and taking a stool softener (such as Colace) will usually help or prevent this problem from occurring.  A mild laxative (Milk of Magnesia or Miralax) should be taken according to package directions if there are no bowel movements after 48 hours. 7. Unless discharge instructions indicate otherwise, you may remove your bandages 24-48 hours after surgery, and you may shower at that time.  You may have steri-strips (small skin tapes) in place directly over the incision.  These strips should be left on the skin for 7-10  days.  If your surgeon used skin glue on the incision, you may shower in 24 hours.  The glue will flake off over the next 2-3 weeks.  Any sutures or staples will be removed at the office during your follow-up visit. 8. ACTIVITIES:  You may resume regular (light) daily activities beginning the next day--such as daily self-care, walking, climbing stairs--gradually increasing activities as tolerated.  You may have sexual intercourse when it is comfortable.  Refrain from any heavy lifting or straining until approved by your doctor.  a.You may drive when you are no longer taking prescription pain medication, you can comfortably wear a seatbelt, and you can safely maneuver your car and apply brakes. b.RETURN TO WORK:   _____________________________________________  9.You should see your doctor in the office for a follow-up appointment approximately 2-3 weeks after your surgery.  Make sure that you call for this appointment within a day or two after you arrive home to insure a convenient appointment time. 10.OTHER INSTRUCTIONS: _________________________    _____________________________________  WHEN TO CALL YOUR DOCTOR: 1. Fever over 101.0 2. Inability to urinate 3. Nausea and/or vomiting 4. Extreme swelling or bruising 5. Continued bleeding from incision. 6. Increased pain, redness, or drainage from the incision  The clinic staff is available to answer your questions during regular business hours.  Please don't hesitate to call and ask to speak to one of the nurses for clinical concerns.  If you have a medical emergency, go to the nearest emergency room or call 911.  A surgeon from [facility] is  always on call at the hospital   [address], [place], [place], [place]  [address] ?  [address], [place], [place]   [id number] [phone] ? [phone] ? FAX [phone] Web site: [email or web address]   Post Anesthesia Home Care Instructions  Activity: Get  plenty of rest for the remainder of the day. A responsible individual must stay with you for 24 hours following the procedure.  For the next 24 hours, DO NOT: -Drive a car -Paediatric nurse -Drink alcoholic beverages -Take any medication unless instructed by your physician -Make any legal decisions or sign important papers.  Meals: Start with liquid foods such as gelatin or soup. Progress to regular foods as tolerated. Avoid greasy, spicy, heavy foods. If nausea and/or vomiting occur, drink only clear liquids until the nausea and/or vomiting subsides. Call your physician if vomiting continues.  Special Instructions/Symptoms: Your throat may feel dry or sore from the anesthesia or the breathing tube placed in your throat during surgery. If this causes discomfort, gargle with warm salt water. The discomfort should disappear within 24 hours.  If you had a scopolamine patch placed behind your ear for the management of post- operative nausea and/or vomiting:  1. The medication in the patch is effective for 72 hours, after which it should be removed.  Wrap patch in a tissue and discard in the trash. Wash hands thoroughly with soap and water. 2. You may remove the patch earlier than 72 hours if you experience unpleasant side effects which may include dry mouth, dizziness or visual disturbances. 3. Avoid touching the patch. Wash your hands with soap and water after contact with the patch.

## 2020-07-28 ENCOUNTER — Encounter (HOSPITAL_BASED_OUTPATIENT_CLINIC_OR_DEPARTMENT_OTHER): Payer: Self-pay | Admitting: Surgery

## 2020-08-14 DIAGNOSIS — N401 Enlarged prostate with lower urinary tract symptoms: Secondary | ICD-10-CM | POA: Diagnosis not present

## 2020-08-14 DIAGNOSIS — R3912 Poor urinary stream: Secondary | ICD-10-CM | POA: Diagnosis not present

## 2020-08-16 ENCOUNTER — Other Ambulatory Visit: Payer: Self-pay | Admitting: Internal Medicine

## 2020-08-16 DIAGNOSIS — M159 Polyosteoarthritis, unspecified: Secondary | ICD-10-CM

## 2020-08-21 DIAGNOSIS — R3912 Poor urinary stream: Secondary | ICD-10-CM | POA: Diagnosis not present

## 2020-08-25 DIAGNOSIS — N401 Enlarged prostate with lower urinary tract symptoms: Secondary | ICD-10-CM | POA: Diagnosis not present

## 2020-08-25 DIAGNOSIS — R3911 Hesitancy of micturition: Secondary | ICD-10-CM | POA: Diagnosis not present

## 2020-08-25 DIAGNOSIS — R3912 Poor urinary stream: Secondary | ICD-10-CM | POA: Diagnosis not present

## 2020-09-05 ENCOUNTER — Encounter: Payer: Self-pay | Admitting: Internal Medicine

## 2020-09-07 DIAGNOSIS — H43393 Other vitreous opacities, bilateral: Secondary | ICD-10-CM | POA: Diagnosis not present

## 2020-09-07 DIAGNOSIS — H524 Presbyopia: Secondary | ICD-10-CM | POA: Diagnosis not present

## 2020-09-14 ENCOUNTER — Ambulatory Visit (INDEPENDENT_AMBULATORY_CARE_PROVIDER_SITE_OTHER): Payer: Medicare Other

## 2020-09-14 ENCOUNTER — Other Ambulatory Visit: Payer: Self-pay

## 2020-09-14 DIAGNOSIS — Z23 Encounter for immunization: Secondary | ICD-10-CM | POA: Diagnosis not present

## 2020-09-21 DIAGNOSIS — N401 Enlarged prostate with lower urinary tract symptoms: Secondary | ICD-10-CM | POA: Diagnosis not present

## 2020-09-21 DIAGNOSIS — R3912 Poor urinary stream: Secondary | ICD-10-CM | POA: Diagnosis not present

## 2020-11-07 ENCOUNTER — Encounter: Payer: Self-pay | Admitting: Internal Medicine

## 2020-11-07 DIAGNOSIS — D225 Melanocytic nevi of trunk: Secondary | ICD-10-CM | POA: Diagnosis not present

## 2020-11-07 DIAGNOSIS — L821 Other seborrheic keratosis: Secondary | ICD-10-CM | POA: Diagnosis not present

## 2020-11-07 DIAGNOSIS — Z85828 Personal history of other malignant neoplasm of skin: Secondary | ICD-10-CM | POA: Diagnosis not present

## 2020-11-07 DIAGNOSIS — L570 Actinic keratosis: Secondary | ICD-10-CM | POA: Diagnosis not present

## 2020-11-07 DIAGNOSIS — L820 Inflamed seborrheic keratosis: Secondary | ICD-10-CM | POA: Diagnosis not present

## 2020-11-07 DIAGNOSIS — K629 Disease of anus and rectum, unspecified: Secondary | ICD-10-CM | POA: Diagnosis not present

## 2020-11-07 DIAGNOSIS — L578 Other skin changes due to chronic exposure to nonionizing radiation: Secondary | ICD-10-CM | POA: Diagnosis not present

## 2020-11-07 DIAGNOSIS — L814 Other melanin hyperpigmentation: Secondary | ICD-10-CM | POA: Diagnosis not present

## 2020-11-14 ENCOUNTER — Other Ambulatory Visit: Payer: Self-pay | Admitting: Internal Medicine

## 2020-11-14 DIAGNOSIS — M159 Polyosteoarthritis, unspecified: Secondary | ICD-10-CM

## 2020-11-14 DIAGNOSIS — E785 Hyperlipidemia, unspecified: Secondary | ICD-10-CM

## 2020-11-14 DIAGNOSIS — M8949 Other hypertrophic osteoarthropathy, multiple sites: Secondary | ICD-10-CM

## 2020-11-14 MED ORDER — ROSUVASTATIN CALCIUM 10 MG PO TABS: ORAL_TABLET | ORAL | 1 refills | Status: DC

## 2020-11-14 MED ORDER — MELOXICAM 15 MG PO TABS: ORAL_TABLET | ORAL | 0 refills | Status: DC

## 2020-11-29 DIAGNOSIS — Z96643 Presence of artificial hip joint, bilateral: Secondary | ICD-10-CM | POA: Diagnosis not present

## 2020-11-30 DIAGNOSIS — R972 Elevated prostate specific antigen [PSA]: Secondary | ICD-10-CM | POA: Diagnosis not present

## 2020-11-30 LAB — PSA: PSA: 7.6

## 2020-12-06 DIAGNOSIS — N401 Enlarged prostate with lower urinary tract symptoms: Secondary | ICD-10-CM | POA: Diagnosis not present

## 2020-12-06 DIAGNOSIS — R3915 Urgency of urination: Secondary | ICD-10-CM | POA: Diagnosis not present

## 2020-12-06 DIAGNOSIS — R972 Elevated prostate specific antigen [PSA]: Secondary | ICD-10-CM | POA: Diagnosis not present

## 2021-01-04 ENCOUNTER — Other Ambulatory Visit: Payer: Self-pay | Admitting: Internal Medicine

## 2021-01-04 DIAGNOSIS — K219 Gastro-esophageal reflux disease without esophagitis: Secondary | ICD-10-CM

## 2021-01-12 ENCOUNTER — Encounter: Payer: Self-pay | Admitting: Gastroenterology

## 2021-01-12 ENCOUNTER — Ambulatory Visit: Payer: PPO | Admitting: Gastroenterology

## 2021-01-12 VITALS — BP 118/70 | HR 68 | Ht 72.5 in | Wt 203.5 lb

## 2021-01-12 DIAGNOSIS — Z86010 Personal history of colonic polyps: Secondary | ICD-10-CM

## 2021-01-12 MED ORDER — SUPREP BOWEL PREP KIT 17.5-3.13-1.6 GM/177ML PO SOLN
1.0000 | ORAL | 0 refills | Status: DC
Start: 2021-01-12 — End: 2021-01-30

## 2021-01-12 NOTE — Progress Notes (Signed)
Review of pertinent gastrointestinal problems: 1. history of precancerous colon polyps: Colonoscopy February 2007 hyperplastic polyp removed.  Colonoscopy February 2017 3 mm sessile serrated polyp was removed from the ascending colon.  The examination was otherwise normal.  He was recommended to have repeat colonoscopy at 5-year interval   HPI: This is a very pleasant 70 year old man whom I last saw about 5 years ago the time of colonoscopy.  See that results summarized above.  He was sent here by his dermatologist.  He felt a scab or rough area at his anus 3 to 4 weeks ago.  It stayed for couple weeks and then it went away.  He was concerned and just wanted it looked at because he has a history of anal warts, history of hemorrhoids.  He has had hemorrhoid surgery as well.  All this was remotely.  He has had no itching or burning, no bleeding.  His weight is overall stable.  He seems to have quite healthy lifestyle except for occasional whiskey   Review of systems: Pertinent positive and negative review of systems were noted in the above HPI section. All other review negative.   Past Medical History:  Diagnosis Date   Complication of anesthesia    sore throat from tube   Dysrhythmia    PVC'S   GERD (gastroesophageal reflux disease)    Hypercholesteremia    Osteoarthritis    Umbilical hernia     Past Surgical History:  Procedure Laterality Date   CARPAL TUNNEL RELEASE Left 05/23/2018   CARPAL TUNNEL RELEASE Right 11/22/2018   CERVICAL SPINE SURGERY     HEMORRHOID SURGERY  1982   INSERTION OF MESH N/A 07/27/2020   Procedure: INSERTION OF MESH;  Surgeon: Erroll Luna, MD;  Location: [facility];  Service: General;  Laterality: N/A;   SHOULDER SURGERY     BIL   TONSILLECTOMY AND ADENOIDECTOMY     TOTAL HIP ARTHROPLASTY     RT TOAL HIP   TOTAL HIP ARTHROPLASTY  06/01/2012   Procedure: TOTAL HIP ARTHROPLASTY ANTERIOR APPROACH;  Surgeon: Mauri Pole, MD;  Location: [facility];  Service: Orthopedics;  Laterality: Left;   UMBILICAL HERNIA REPAIR N/A 07/27/2020   Procedure: UMBILICAL HERNIA REPAIR WITH MESH;  Surgeon: Erroll Luna, MD;  Location: [facility];  Service: General;  Laterality: N/A;    Current Outpatient Medications  Medication Sig Dispense Refill   ALPRAZolam (XANAX) 0.5 MG tablet TAKE 1 TABLET(0.5 MG) BY MOUTH AT BEDTIME AS NEEDED FOR ANXIETY 30 tablet 1   b complex vitamins tablet Take 1 tablet by mouth daily.     meloxicam (MOBIC) 15 MG tablet TAKE 1 TABLET(15 MG) BY MOUTH DAILY 90 tablet 0   pantoprazole (PROTONIX) 20 MG tablet TAKE 1 TABLET(20 MG) BY MOUTH DAILY 90 tablet 1   rosuvastatin (CRESTOR) 10 MG tablet TAKE 1 TABLET(10 MG) BY MOUTH DAILY 90 tablet 1   Suvorexant (BELSOMRA) 15 MG TABS Take 1 tablet by mouth at bedtime as needed. 90 tablet 0   tadalafil (CIALIS) 20 MG tablet Take 1 tablet (20 mg total) by mouth See admin instructions. Take 20 mg by mouth one hour prior to intercourse- maximum of 20 mg (1 tablet) in 24 hours 20 tablet 2   valACYclovir (VALTREX) 1000 MG tablet Take 0.5 tablets (500 mg total) by mouth daily. 30 tablet 0   vitamin C (ASCORBIC ACID) 500 MG tablet Take 500 mg by mouth daily.     Vitamin D,  Ergocalciferol, (DRISDOL) 1.25 MG (50000 UNIT) CAPS capsule TAKE ONE CAPSULE BY MOUTH EVERY SATURDAY 90 capsule 0   No current facility-administered medications for this visit.    Allergies as of 01/12/2021 - Review Complete 01/12/2021  Allergen Reaction Noted   Vytorin [ezetimibe-simvastatin] Other (See Comments)     Family History  Problem Relation Age of Onset   Emphysema Mother    Colon cancer Neg Hx    Esophageal cancer Neg Hx    Rectal cancer Neg Hx    Stomach cancer Neg Hx     Social History   Socioeconomic History   Marital status: Single    Spouse name: Not on file   Number of children: Not on file   Years of education: Not on file    Highest education level: Not on file  Occupational History   Not on file  Tobacco Use   Smoking status: Former Smoker    Quit date: 05/27/1981    Years since quitting: 39.6   Smokeless tobacco: Never Used  Vaping Use   Vaping Use: Never used  Substance and Sexual Activity   Alcohol use: Yes    Alcohol/week: 35.0 standard drinks    Types: 35 Shots of liquor per week    Comment: wine nightly   Drug use: No   Sexual activity: Yes  Other Topics Concern   Not on file  Social History Narrative   Not on file   Social Determinants of Health   Financial Resource Strain: Not on file  Food Insecurity: Not on file  Transportation Needs: Not on file  Physical Activity: Not on file  Stress: Not on file  Social Connections: Not on file  Intimate Partner Violence: Not on file     Physical Exam: BP 118/70 (BP Location: Left Arm, Patient Position: Sitting, Cuff Size: Normal)    Pulse 68 Comment: irregular   Ht 6' 0.5" (1.842 m) Comment: height measured without shoes   Wt 203 lb 8 oz (92.3 kg)    BMI 27.22 kg/m  Constitutional: generally well-appearing Psychiatric: alert and oriented x3 Eyes: extraocular movements intact Mouth: oral pharynx moist, no lesions Neck: supple no lymphadenopathy Cardiovascular: heart regular rate and rhythm Lungs: clear to auscultation bilaterally Abdomen: soft, nontender, nondistended, no obvious ascites, no peritoneal signs, normal bowel sounds Extremities: no lower extremity edema bilaterally Skin: no lesions on visible extremities Rectal exam: Deflated soft external anal hemorrhoid tissue, evidence of previous hemorrhoid procedure with some slight scar tissue, I could not detect any rough or firm areas by digital rectal exam, stool was brown and not checked for Hemoccult, and anoscopy was deferred for upcoming colonoscopy  Assessment and plan: 70 y.o. male with personal history of precancerous colon polyps, rough anal area  The anal area which  he was describing has completely resolved from digital rectal exam and external evaluation.  I do not feel anything that is remotely concerning by digital rectal exam.  He had a sessile serrated adenoma removed 5 years ago and needs a repeat colonoscopy around now.  I explained to him that I will get a very good look at the internal anal apparatus on that examination I see no reason for any further blood tests or imaging studies prior to then.    Please see the "Patient Instructions" section for addition details about the plan.   Owens Loffler, MD [facility] 01/12/2021, 1:57 PM  Cc: Janith Lima, MD  Total time on date of encounter was 45  minutes (  this included time spent preparing to see the patient reviewing records; obtaining and/or reviewing separately obtained history; performing a medically appropriate exam and/or evaluation; counseling and educating the patient and family if present; ordering medications, tests or procedures if applicable; and documenting clinical information in the health record).

## 2021-01-12 NOTE — Patient Instructions (Signed)
If you are age 70 or older, your body mass index should be between 23-30. Your Body mass index is 27.22 kg/m. If this is out of the aforementioned range listed, please consider follow up with your Primary Care Provider.  You have been scheduled for a colonoscopy. Please follow written instructions given to you at your visit today.  Please pick up your prep supplies at the pharmacy within the next 1-3 days. If you use inhalers (even only as needed), please bring them with you on the day of your procedure.  Due to recent changes in healthcare laws, you may see the results of your imaging and laboratory studies on MyChart before your provider has had a chance to review them.  We understand that in some cases there may be results that are confusing or concerning to you. Not all laboratory results come back in the same time frame and the provider may be waiting for multiple results in order to interpret others.  Please give [place] 48 hours in order for your provider to thoroughly review all the results before contacting the office for clarification of your results.   Thank you for entrusting me with your care and choosing [facility].  Dr Ardis Hughs

## 2021-01-24 DIAGNOSIS — M25551 Pain in right hip: Secondary | ICD-10-CM | POA: Diagnosis not present

## 2021-01-24 DIAGNOSIS — Z96641 Presence of right artificial hip joint: Secondary | ICD-10-CM | POA: Diagnosis not present

## 2021-01-30 ENCOUNTER — Ambulatory Visit (AMBULATORY_SURGERY_CENTER): Payer: PPO | Admitting: Gastroenterology

## 2021-01-30 ENCOUNTER — Other Ambulatory Visit: Payer: Self-pay

## 2021-01-30 ENCOUNTER — Encounter: Payer: Self-pay | Admitting: Gastroenterology

## 2021-01-30 VITALS — BP 128/74 | HR 64 | Temp 97.9°F | Resp 17 | Ht 73.0 in | Wt 203.0 lb

## 2021-01-30 DIAGNOSIS — D124 Benign neoplasm of descending colon: Secondary | ICD-10-CM

## 2021-01-30 DIAGNOSIS — Z86010 Personal history of colonic polyps: Secondary | ICD-10-CM | POA: Diagnosis not present

## 2021-01-30 DIAGNOSIS — D125 Benign neoplasm of sigmoid colon: Secondary | ICD-10-CM | POA: Diagnosis not present

## 2021-01-30 DIAGNOSIS — Z1211 Encounter for screening for malignant neoplasm of colon: Secondary | ICD-10-CM | POA: Diagnosis not present

## 2021-01-30 LAB — HM COLONOSCOPY

## 2021-01-30 MED ORDER — SODIUM CHLORIDE 0.9 % IV SOLN
500.0000 mL | Freq: Once | INTRAVENOUS | Status: DC
Start: 2021-01-30 — End: 2021-01-30

## 2021-01-30 NOTE — Patient Instructions (Signed)
Handouts provided on polyps and hemorrhoids.   YOU HAD AN ENDOSCOPIC PROCEDURE TODAY AT THE [facility]:   Refer to the procedure report that was given to you for any specific questions about what was found during the examination.  If the procedure report does not answer your questions, please call your gastroenterologist to clarify.  If you requested that your care partner not be given the details of your procedure findings, then the procedure report has been included in a sealed envelope for you to review at your convenience later.  YOU SHOULD EXPECT: Some feelings of bloating in the abdomen. Passage of more gas than usual.  Walking can help get rid of the air that was put into your GI tract during the procedure and reduce the bloating. If you had a lower endoscopy (such as a colonoscopy or flexible sigmoidoscopy) you may notice spotting of blood in your stool or on the toilet paper. If you underwent a bowel prep for your procedure, you may not have a normal bowel movement for a few days.  Please Note:  You might notice some irritation and congestion in your nose or some drainage.  This is from the oxygen used during your procedure.  There is no need for concern and it should clear up in a day or so.  SYMPTOMS TO REPORT IMMEDIATELY:  Following lower endoscopy (colonoscopy or flexible sigmoidoscopy):  Excessive amounts of blood in the stool  Significant tenderness or worsening of abdominal pains  Swelling of the abdomen that is new, acute  Fever of 100F or higher  For urgent or emergent issues, a gastroenterologist can be reached at any hour by calling [phone]. Do not use MyChart messaging for urgent concerns.    DIET:  We do recommend a small meal at first, but then you may proceed to your regular diet.  Drink plenty of fluids but you should avoid alcoholic beverages for 24 hours.  ACTIVITY:  You should plan to take it easy for the rest of today and you should NOT DRIVE  or use heavy machinery until tomorrow (because of the sedation medicines used during the test).    FOLLOW UP: Our staff will call the number listed on your records 48-72 hours following your procedure to check on you and address any questions or concerns that you may have regarding the information given to you following your procedure. If we do not reach you, we will leave a message.  We will attempt to reach you two times.  During this call, we will ask if you have developed any symptoms of COVID 19. If you develop any symptoms (ie: fever, flu-like symptoms, shortness of breath, cough etc.) before then, please call [phone].  If you test positive for Covid 19 in the 2 weeks post procedure, please call and report this information to us.    If any biopsies were taken you will be contacted by phone or by letter within the next 1-3 weeks.  Please call us at [phone] if you have not heard about the biopsies in 3 weeks.    SIGNATURES/CONFIDENTIALITY: You and/or your care partner have signed paperwork which will be entered into your electronic medical record.  These signatures attest to the fact that that the information above on your After Visit Summary has been reviewed and is understood.  Full responsibility of the confidentiality of this discharge information lies with you and/or your care-partner.  

## 2021-01-30 NOTE — Progress Notes (Signed)
Pt's states no medical or surgical changes since previsit or office visit.  CW vitals and SB IV. 

## 2021-01-30 NOTE — Op Note (Signed)
[facility] Patient Name: Jveon Pound Procedure Date: 01/30/2021 10:22 AM MRN: [id number] Endoscopist: Milus Banister , MD Age: 70 Referring MD:  Date of Birth: 09-19-1951 Gender: Male Account #: [id number] Procedure:                Colonoscopy Indications:              High risk colon cancer surveillance: Personal                            history of colonic polyps; Colonoscopy February                            2007 hyperplastic polyp removed. Colonoscopy                            February 2017 3 mm sessile serrated polyp was                            removed from the ascending colon. The examination                            was otherwise normal. Medicines:                Monitored Anesthesia Care Procedure:                Pre-Anesthesia Assessment:                           - Prior to the procedure, a History and Physical                            was performed, and patient medications and                            allergies were reviewed. The patient's tolerance of                            previous anesthesia was also reviewed. The risks                            and benefits of the procedure and the sedation                            options and risks were discussed with the patient.                            All questions were answered, and informed consent                            was obtained. Prior Anticoagulants: The patient has                            taken no previous anticoagulant or antiplatelet  agents. ASA Grade Assessment: II - A patient with                            mild systemic disease. After reviewing the risks                            and benefits, the patient was deemed in                            satisfactory condition to undergo the procedure.                           After obtaining informed consent, the colonoscope                            was passed under direct vision. Throughout the                             procedure, the patient's blood pressure, pulse, and                            oxygen saturations were monitored continuously. The                            Olympus CF-HQ190L (Serial# [id number]) Colonoscope was                            introduced through the anus and advanced to the the                            cecum, identified by appendiceal orifice and                            ileocecal valve. The colonoscopy was performed                            without difficulty. The patient tolerated the                            procedure well. The quality of the bowel                            preparation was good. The ileocecal valve,                            appendiceal orifice, and rectum were photographed. Scope In: 10:35:04 AM Scope Out: 10:46:22 AM Scope Withdrawal Time: 0 hours 9 minutes 3 seconds  Total Procedure Duration: 0 hours 11 minutes 18 seconds  Findings:                 Two sessile polyps were found in the sigmoid colon                            and descending colon. The polyps were 1  to 2 mm in                            size. These polyps were removed with a cold snare.                            Resection and retrieval were complete.                           External hemorrhoids were found. The hemorrhoids                            were small.                           The exam was otherwise without abnormality on                            direct and retroflexion views. Complications:            No immediate complications. Estimated blood loss:                            None. Estimated Blood Loss:     Estimated blood loss: none. Impression:               - Two 1 to 2 mm polyps in the sigmoid colon and in                            the descending colon, removed with a cold snare.                            Resected and retrieved.                           - External hemorrhoids.                           - The examination was otherwise normal on  direct                            and retroflexion views. Recommendation:           - Patient has a contact number available for                            emergencies. The signs and symptoms of potential                            delayed complications were discussed with the                            patient. Return to normal activities tomorrow.                            Written discharge instructions were provided to the  patient.                           - Resume previous diet.                           - Continue present medications.                           - Await pathology results. Milus Banister, MD 01/30/2021 10:48:45 AM This report has been signed electronically.

## 2021-01-30 NOTE — Progress Notes (Signed)
Called to room to assist during endoscopic procedure.  Patient ID and intended procedure confirmed with present staff. Received instructions for my participation in the procedure from the performing physician.  

## 2021-01-30 NOTE — Progress Notes (Signed)
A and O x3. Report to RN. Tolerated MAC anesthesia well.

## 2021-02-01 ENCOUNTER — Telehealth: Payer: Self-pay

## 2021-02-01 NOTE — Telephone Encounter (Signed)
  Follow up Call-  Call back number 01/30/2021  Post procedure Call Back phone  # [phone]  Permission to leave phone message Yes  Some recent data might be hidden     Patient questions:  Do you have a fever, pain , or abdominal swelling? No. Pain Score  0 *  Have you tolerated food without any problems? Yes.    Have you been able to return to your normal activities? Yes.    Do you have any questions about your discharge instructions: Diet   No. Medications  No. Follow up visit  No.  Do you have questions or concerns about your Care? No.  Actions: * If pain score is 4 or above: No action needed, pain <4. 1. Have you developed a fever since your procedure? no  2.   Have you had an respiratory symptoms (SOB or cough) since your procedure? no  3.   Have you tested positive for COVID 19 since your procedure no  4.   Have you had any family members/close contacts diagnosed with the COVID 19 since your procedure?  no   If yes to any of these questions please route to Joylene John, RN and Joella Prince, RN

## 2021-02-06 ENCOUNTER — Encounter: Payer: Self-pay | Admitting: Gastroenterology

## 2021-02-12 ENCOUNTER — Other Ambulatory Visit: Payer: Self-pay | Admitting: Internal Medicine

## 2021-02-12 DIAGNOSIS — M159 Polyosteoarthritis, unspecified: Secondary | ICD-10-CM

## 2021-02-12 DIAGNOSIS — M8949 Other hypertrophic osteoarthropathy, multiple sites: Secondary | ICD-10-CM

## 2021-02-13 DIAGNOSIS — M25551 Pain in right hip: Secondary | ICD-10-CM | POA: Diagnosis not present

## 2021-03-08 DIAGNOSIS — Z96641 Presence of right artificial hip joint: Secondary | ICD-10-CM | POA: Diagnosis not present

## 2021-04-18 ENCOUNTER — Ambulatory Visit (INDEPENDENT_AMBULATORY_CARE_PROVIDER_SITE_OTHER): Payer: PPO | Admitting: Internal Medicine

## 2021-04-18 ENCOUNTER — Encounter: Payer: PPO | Admitting: Internal Medicine

## 2021-04-18 ENCOUNTER — Other Ambulatory Visit: Payer: Self-pay

## 2021-04-18 ENCOUNTER — Encounter: Payer: Self-pay | Admitting: Internal Medicine

## 2021-04-18 VITALS — BP 118/66 | HR 78 | Temp 98.0°F | Resp 16 | Ht 73.25 in | Wt 197.0 lb

## 2021-04-18 DIAGNOSIS — E875 Hyperkalemia: Secondary | ICD-10-CM | POA: Diagnosis not present

## 2021-04-18 DIAGNOSIS — F5104 Psychophysiologic insomnia: Secondary | ICD-10-CM | POA: Diagnosis not present

## 2021-04-18 DIAGNOSIS — E559 Vitamin D deficiency, unspecified: Secondary | ICD-10-CM | POA: Diagnosis not present

## 2021-04-18 DIAGNOSIS — K219 Gastro-esophageal reflux disease without esophagitis: Secondary | ICD-10-CM

## 2021-04-18 DIAGNOSIS — I10 Essential (primary) hypertension: Secondary | ICD-10-CM | POA: Diagnosis not present

## 2021-04-18 DIAGNOSIS — E538 Deficiency of other specified B group vitamins: Secondary | ICD-10-CM

## 2021-04-18 DIAGNOSIS — N183 Chronic kidney disease, stage 3 unspecified: Secondary | ICD-10-CM | POA: Diagnosis not present

## 2021-04-18 DIAGNOSIS — E785 Hyperlipidemia, unspecified: Secondary | ICD-10-CM | POA: Diagnosis not present

## 2021-04-18 DIAGNOSIS — Z0000 Encounter for general adult medical examination without abnormal findings: Secondary | ICD-10-CM | POA: Diagnosis not present

## 2021-04-18 DIAGNOSIS — R972 Elevated prostate specific antigen [PSA]: Secondary | ICD-10-CM

## 2021-04-18 DIAGNOSIS — K760 Fatty (change of) liver, not elsewhere classified: Secondary | ICD-10-CM

## 2021-04-18 DIAGNOSIS — E781 Pure hyperglyceridemia: Secondary | ICD-10-CM

## 2021-04-18 LAB — BASIC METABOLIC PANEL
BUN: 23 mg/dL (ref 6–23)
CO2: 27 mEq/L (ref 19–32)
Calcium: 10 mg/dL (ref 8.4–10.5)
Chloride: 105 mEq/L (ref 96–112)
Creatinine, Ser: 1.19 mg/dL (ref 0.40–1.50)
GFR: 61.98 mL/min (ref >60.00)
Glucose, Bld: 102 mg/dL — ABNORMAL HIGH (ref 70–99)
Potassium: 5.6 mEq/L — ABNORMAL HIGH (ref 3.5–5.1)
Sodium: 140 mEq/L (ref 135–145)

## 2021-04-18 LAB — LIPID PANEL
Cholesterol: 198 mg/dL (ref 0–200)
HDL: 42.6 mg/dL (ref >39.00)
LDL Cholesterol: 123 mg/dL — ABNORMAL HIGH (ref 0–99)
NonHDL: 155.59
Total CHOL/HDL Ratio: 5
Triglycerides: 165 mg/dL — ABNORMAL HIGH (ref 0.0–149.0)
VLDL: 33 mg/dL (ref 0.0–40.0)

## 2021-04-18 LAB — CBC WITH DIFFERENTIAL/PLATELET
Basophils Absolute: 0 10*3/uL (ref 0.0–0.1)
Basophils Relative: 0.5 % (ref 0.0–3.0)
Eosinophils Absolute: 0.3 10*3/uL (ref 0.0–0.7)
Eosinophils Relative: 4.3 % (ref 0.0–5.0)
HCT: 44.2 % (ref 39.0–52.0)
Hemoglobin: 15 g/dL (ref 13.0–17.0)
Lymphocytes Relative: 36.1 % (ref 12.0–46.0)
Lymphs Abs: 2.2 10*3/uL (ref 0.7–4.0)
MCHC: 33.9 g/dL (ref 30.0–36.0)
MCV: 95.7 fl (ref 78.0–100.0)
Monocytes Absolute: 0.5 10*3/uL (ref 0.1–1.0)
Monocytes Relative: 8.9 % (ref 3.0–12.0)
Neutro Abs: 3.1 10*3/uL (ref 1.4–7.7)
Neutrophils Relative %: 50.2 % (ref 43.0–77.0)
Platelets: 217 10*3/uL (ref 150.0–400.0)
RBC: 4.62 Mil/uL (ref 4.22–5.81)
RDW: 12.8 % (ref 11.5–15.5)
WBC: 6.1 10*3/uL (ref 4.0–10.5)

## 2021-04-18 LAB — PROTIME-INR
INR: 1 ratio (ref 0.8–1.0)
Prothrombin Time: 10.8 s (ref 9.6–13.1)

## 2021-04-18 LAB — HEPATIC FUNCTION PANEL
ALT: 34 U/L (ref 0–53)
AST: 33 U/L (ref 0–37)
Albumin: 4.6 g/dL (ref 3.5–5.2)
Alkaline Phosphatase: 51 U/L (ref 39–117)
Bilirubin, Direct: 0.1 mg/dL (ref 0.0–0.3)
Total Bilirubin: 0.5 mg/dL (ref 0.2–1.2)
Total Protein: 7.2 g/dL (ref 6.0–8.3)

## 2021-04-18 LAB — VITAMIN D 25 HYDROXY (VIT D DEFICIENCY, FRACTURES): VITD: 50.96 ng/mL (ref 30.00–100.00)

## 2021-04-18 LAB — TSH: TSH: 1.54 u[IU]/mL (ref 0.35–4.50)

## 2021-04-18 LAB — VITAMIN B12: Vitamin B-12: 791 pg/mL (ref 211–911)

## 2021-04-18 LAB — FOLATE: Folate: >23.6 ng/mL (ref >5.9)

## 2021-04-18 MED ORDER — ROSUVASTATIN CALCIUM 10 MG PO TABS: ORAL_TABLET | ORAL | 1 refills | Status: DC

## 2021-04-18 MED ORDER — PANTOPRAZOLE SODIUM 40 MG PO TBEC: 40.0000 mg | DELAYED_RELEASE_TABLET | Freq: Every day | ORAL | 1 refills | Status: DC

## 2021-04-18 NOTE — Progress Notes (Signed)
Subjective:  Patient ID: Jonathan Young, male    DOB: December 19, 1951  Age: 69 y.o. MRN: [id number]  CC: Annual Exam (Patient would like to discuss possibly increasing Pantoprazole to 40mg ), Hyperlipidemia, and Gastroesophageal Reflux   This visit occurred during the SARS-CoV-2 public health emergency.  Safety protocols were in place, including screening questions prior to the visit, additional usage of staff PPE, and extensive cleaning of exam room while observing appropriate contact time as indicated for disinfecting solutions.    HPI Jonathan Young presents for a CPX and f/up -   He is very active and denies any recent episodes of chest pain, shortness of breath, diaphoresis, dizziness, lightheadedness, or edema.  He has had some increase in heartburn and would like to increase the dose of his PPI.  He denies odynophagia, dysphagia, loss of appetite, melena, or weight loss.  Outpatient Medications Prior to Visit  Medication Sig Dispense Refill  . ALPRAZolam (XANAX) 0.5 MG tablet TAKE 1 TABLET(0.5 MG) BY MOUTH AT BEDTIME AS NEEDED FOR ANXIETY 30 tablet 1  . b complex vitamins tablet Take 1 tablet by mouth daily.    . meloxicam (MOBIC) 15 MG tablet TAKE 1 TABLET(15 MG) BY MOUTH DAILY 90 tablet 0  . Suvorexant (BELSOMRA) 15 MG TABS Take 1 tablet by mouth at bedtime as needed. 90 tablet 0  . tadalafil (CIALIS) 20 MG tablet Take 1 tablet (20 mg total) by mouth See admin instructions. Take 20 mg by mouth one hour prior to intercourse- maximum of 20 mg (1 tablet) in 24 hours 20 tablet 2  . valACYclovir (VALTREX) 1000 MG tablet Take 0.5 tablets (500 mg total) by mouth daily. 30 tablet 0  . vitamin C (ASCORBIC ACID) 500 MG tablet Take 500 mg by mouth daily.    . Vitamin D, Ergocalciferol, (DRISDOL) 1.25 MG (50000 UNIT) CAPS capsule TAKE ONE CAPSULE BY MOUTH EVERY SATURDAY 90 capsule 0  . pantoprazole (PROTONIX) 20 MG tablet TAKE 1 TABLET(20 MG) BY MOUTH DAILY 90 tablet 1  . rosuvastatin (CRESTOR) 10  MG tablet TAKE 1 TABLET(10 MG) BY MOUTH DAILY 90 tablet 1   No facility-administered medications prior to visit.    ROS Review of Systems  Constitutional: Negative for diaphoresis and fatigue.  HENT: Negative.   Eyes: Negative.   Respiratory: Negative for cough, chest tightness, shortness of breath and wheezing.   Cardiovascular: Negative for chest pain, palpitations and leg swelling.  Gastrointestinal: Negative for abdominal pain, diarrhea, nausea and vomiting.  Endocrine: Negative.   Genitourinary: Negative.  Negative for difficulty urinating.  Musculoskeletal: Positive for arthralgias. Negative for back pain and myalgias.  Skin: Negative.   Neurological: Negative.  Negative for dizziness and weakness.  Hematological: Negative for adenopathy. Does not bruise/bleed easily.  Psychiatric/Behavioral: Negative.     Objective:  BP 118/66 (BP Location: Right Arm, Patient Position: Sitting, Cuff Size: Large)   Pulse 78   Temp 98 F (36.7 C) (Oral)   Resp 16   Ht 6' 1.25" (1.861 m)   Wt 197 lb (89.4 kg)   SpO2 97%   BMI 25.81 kg/m   BP Readings from Last 3 Encounters:  04/18/21 118/66  01/30/21 128/74  01/12/21 118/70    Wt Readings from Last 3 Encounters:  04/18/21 197 lb (89.4 kg)  01/30/21 203 lb (92.1 kg)  01/12/21 203 lb 8 oz (92.3 kg)    Physical Exam Vitals reviewed.  Constitutional:      Appearance: Normal appearance.  HENT:  Mouth/Throat:     Mouth: Mucous membranes are moist.  Eyes:     General: No scleral icterus.    Conjunctiva/sclera: Conjunctivae normal.  Cardiovascular:     Rate and Rhythm: Normal rate and regular rhythm.     Heart sounds: No murmur heard.   Pulmonary:     Effort: Pulmonary effort is normal.     Breath sounds: No stridor. No wheezing, rhonchi or rales.  Abdominal:     General: Abdomen is flat. Bowel sounds are normal. There is no distension.     Palpations: Abdomen is soft. There is no hepatomegaly, splenomegaly or mass.      Tenderness: There is no abdominal tenderness.  Musculoskeletal:        General: Normal range of motion.     Cervical back: Neck supple.     Right lower leg: No edema.     Left lower leg: No edema.  Lymphadenopathy:     Cervical: No cervical adenopathy.  Skin:    General: Skin is warm and dry.     Coloration: Skin is not pale.  Neurological:     General: No focal deficit present.     Mental Status: He is alert.  Psychiatric:        Mood and Affect: Mood normal.        Behavior: Behavior normal.     Lab Results  Component Value Date   WBC 6.1 04/18/2021   HGB 15.0 04/18/2021   HCT 44.2 04/18/2021   PLT 217.0 04/18/2021   GLUCOSE 94 04/19/2021   CHOL 198 04/18/2021   TRIG 165.0 (H) 04/18/2021   HDL 42.60 04/18/2021   LDLDIRECT 87.0 04/03/2016   LDLCALC 123 (H) 04/18/2021   ALT 34 04/18/2021   AST 33 04/18/2021   NA 138 04/19/2021   K 4.7 04/19/2021   CL 104 04/19/2021   CREATININE 1.25 04/19/2021   BUN 32 (H) 04/19/2021   CO2 30 04/19/2021   TSH 1.54 04/18/2021   PSA 7.6 11/30/2020   INR 1.0 04/18/2021   HGBA1C 5.8 04/03/2016    MR PROSTATE W WO CONTRAST  Result Date: 07/25/2020 CLINICAL DATA:  Elevated PSA. EXAM: MR PROSTATE WITHOUT AND WITH CONTRAST TECHNIQUE: Multiplanar multisequence MRI images were obtained of the pelvis centered about the prostate. Pre and post contrast images were obtained. CONTRAST:  75mL GADAVIST GADOBUTROL 1 MMOL/ML IV SOLN COMPARISON:  02/23/2018 FINDINGS: Prostate: -- Peripheral Zone: Significant image degradation is seen on diffusion imaging due to susceptibility artifact from bilateral hip prostheses. Diffusion imaging is essentially nondiagnostic. A T2 hypointense nodule is seen in the left anterior mid gland which measures 6 x 4 mm on image 13/7. This was not definitely seen on prior study, but cannot be assessed on diffusion imaging. -- Transition/Central Zone: Circumscribed BPH nodules are noted, but no suspicious T2 hypointense nodules  identified. Diffusion imaging is nondiagnostic. -- Measurements/Volume:  4.5 x 4.3 x 5.5 cm Transcapsular spread:  Absent Seminal vesicle involvement:  Absent Neurovascular bundle involvement:  Absent Pelvic adenopathy: None visualized Bone metastasis: None visualized Other:  None IMPRESSION: Significant susceptibility artifact from bilateral hip prostheses. Diffusion imaging is nondiagnostic. 6 mm T2 hypointense peripheral zone nodule in the left anterior mid gland, which cannot be characterized on diffusion imaging. This is best categorized as PI-RADS 3: Intermediate (the presence of clinically significant cancer is equivocal). No evidence of extracapsular extension or pelvic metastatic disease. (I have processed this exam in the DynaCAD application for MR/US fusion-guided biopsy if performed.)  Electronically Signed   By: Marlaine Hind M.D.   On: 07/25/2020 15:40    Assessment & Plan:   Jonathan Young was seen today for annual exam, hyperlipidemia and gastroesophageal reflux.  Diagnoses and all orders for this visit:  Essential hypertension- His blood pressure is adequately well controlled. -     CBC with Differential/Platelet; Future -     Basic metabolic panel; Future -     TSH; Future -     TSH -     Basic metabolic panel -     CBC with Differential/Platelet  Fatty infiltration of liver- His liver enzymes are better. -     Hepatic function panel; Future -     Protime-INR; Future -     Protime-INR -     Hepatic function panel  CRI (chronic renal insufficiency), stage 3 (moderate) (HCC)- His renal function is stable. -     CBC with Differential/Platelet; Future -     Basic metabolic panel; Future -     Basic metabolic panel -     CBC with Differential/Platelet  Hyperlipidemia with target LDL less than 130- He has achieved his LDL goal and is doing well on the statin. -     Lipid panel; Future -     rosuvastatin (CRESTOR) 10 MG tablet; TAKE 1 TABLET(10 MG) BY MOUTH DAILY -     Lipid  panel  Routine general medical examination at a health care facility- Exam completed, labs reviewed, vaccines reviewed and updated, cancer screenings are up-to-date, patient education was given.  PSA elevation- This is followed closely by urology. -     Cancel: PSA, total and free; Future  Vitamin B 12 deficiency- His H&H, B12, and folate levels are normal now. -     Vitamin B12; Future -     Folate; Future -     Folate -     Vitamin B12  Pure hyperglyceridemia  Psychophysiological insomnia  Vitamin D deficiency -     VITAMIN D 25 Hydroxy (Vit-D Deficiency, Fractures); Future -     VITAMIN D 25 Hydroxy (Vit-D Deficiency, Fractures)  Gastroesophageal reflux disease without esophagitis -     pantoprazole (PROTONIX) 40 MG tablet; Take 1 tablet (40 mg total) by mouth daily.  Hyperkalemia- His potassium level is mildly elevated today.  I have asked him to return to have this rechecked and to undergo an EKG to make sure he does not have any cardiac involvement. -     Cancel: Basic metabolic panel; Future   I have discontinued Jonathan Young's pantoprazole. I am also having him start on pantoprazole. Additionally, I am having him maintain his valACYclovir, b complex vitamins, vitamin C, ALPRAZolam, Belsomra, tadalafil, Vitamin D (Ergocalciferol), meloxicam, and rosuvastatin.  Meds ordered this encounter  Medications  . pantoprazole (PROTONIX) 40 MG tablet    Sig: Take 1 tablet (40 mg total) by mouth daily.    Dispense:  90 tablet    Refill:  1  . rosuvastatin (CRESTOR) 10 MG tablet    Sig: TAKE 1 TABLET(10 MG) BY MOUTH DAILY    Dispense:  90 tablet    Refill:  1     Follow-up: Return in about 6 months (around 10/18/2021).  Scarlette Calico, MD

## 2021-04-18 NOTE — Patient Instructions (Addendum)

## 2021-04-19 ENCOUNTER — Ambulatory Visit (INDEPENDENT_AMBULATORY_CARE_PROVIDER_SITE_OTHER): Payer: PPO | Admitting: Internal Medicine

## 2021-04-19 ENCOUNTER — Encounter: Payer: Self-pay | Admitting: Internal Medicine

## 2021-04-19 VITALS — BP 122/72 | HR 68 | Temp 98.0°F | Resp 16 | Wt 197.0 lb

## 2021-04-19 DIAGNOSIS — I10 Essential (primary) hypertension: Secondary | ICD-10-CM | POA: Diagnosis not present

## 2021-04-19 DIAGNOSIS — E875 Hyperkalemia: Secondary | ICD-10-CM | POA: Diagnosis not present

## 2021-04-19 DIAGNOSIS — Z96641 Presence of right artificial hip joint: Secondary | ICD-10-CM | POA: Diagnosis not present

## 2021-04-19 LAB — BASIC METABOLIC PANEL
BUN: 32 mg/dL — ABNORMAL HIGH (ref 6–23)
CO2: 30 mEq/L (ref 19–32)
Calcium: 9.5 mg/dL (ref 8.4–10.5)
Chloride: 104 mEq/L (ref 96–112)
Creatinine, Ser: 1.25 mg/dL (ref 0.40–1.50)
GFR: 58.43 mL/min — ABNORMAL LOW (ref >60.00)
Glucose, Bld: 94 mg/dL (ref 70–99)
Potassium: 4.7 mEq/L (ref 3.5–5.1)
Sodium: 138 mEq/L (ref 135–145)

## 2021-04-19 NOTE — Progress Notes (Signed)
Subjective:  Patient ID: Jonathan Young, male    DOB: [birth year]  Age: 70 y.o. MRN: [id number]  CC: Follow-up  This visit occurred during the SARS-CoV-2 public health emergency.  Safety protocols were in place, including screening questions prior to the visit, additional usage of staff PPE, and extensive cleaning of exam room while observing appropriate contact time as indicated for disinfecting solutions.    HPI Jonathan Young presents for f/up -   He was seen 1 day prior to this and was found to have a potassium level of 5.6.  The lab reported no hemolysis.  He does not take a potassium supplement or use any salt substitutes.  He continues to feel well and offers no complaints today.  Outpatient Medications Prior to Visit  Medication Sig Dispense Refill  . ALPRAZolam (XANAX) 0.5 MG tablet TAKE 1 TABLET(0.5 MG) BY MOUTH AT BEDTIME AS NEEDED FOR ANXIETY 30 tablet 1  . b complex vitamins tablet Take 1 tablet by mouth daily.    . meloxicam (MOBIC) 15 MG tablet TAKE 1 TABLET(15 MG) BY MOUTH DAILY 90 tablet 0  . pantoprazole (PROTONIX) 40 MG tablet Take 1 tablet (40 mg total) by mouth daily. 90 tablet 1  . rosuvastatin (CRESTOR) 10 MG tablet TAKE 1 TABLET(10 MG) BY MOUTH DAILY 90 tablet 1  . Suvorexant (BELSOMRA) 15 MG TABS Take 1 tablet by mouth at bedtime as needed. 90 tablet 0  . tadalafil (CIALIS) 20 MG tablet Take 1 tablet (20 mg total) by mouth See admin instructions. Take 20 mg by mouth one hour prior to intercourse- maximum of 20 mg (1 tablet) in 24 hours 20 tablet 2  . valACYclovir (VALTREX) 1000 MG tablet Take 0.5 tablets (500 mg total) by mouth daily. 30 tablet 0  . vitamin C (ASCORBIC ACID) 500 MG tablet Take 500 mg by mouth daily.    . Vitamin D, Ergocalciferol, (DRISDOL) 1.25 MG (50000 UNIT) CAPS capsule TAKE ONE CAPSULE BY MOUTH EVERY SATURDAY 90 capsule 0   No facility-administered medications prior to visit.    ROS Review of Systems  Constitutional: Negative for  diaphoresis and fatigue.  HENT: Negative.   Eyes: Negative.   Respiratory: Negative for cough, chest tightness and shortness of breath.   Cardiovascular: Negative for chest pain, palpitations and leg swelling.  Gastrointestinal: Negative for abdominal pain, constipation, diarrhea, nausea and vomiting.  Endocrine: Negative.   Genitourinary: Negative.  Negative for difficulty urinating.  Musculoskeletal: Negative for arthralgias and myalgias.  Skin: Negative for color change and pallor.  Neurological: Negative.  Negative for dizziness, weakness, light-headedness and numbness.  Hematological: Negative for adenopathy. Does not bruise/bleed easily.  Psychiatric/Behavioral: Negative.     Objective:  BP 122/72 (BP Location: Right Arm, Patient Position: Sitting, Cuff Size: Large)   Pulse 68   Temp 98 F (36.7 C) (Oral)   Resp 16   Wt 197 lb (89.4 kg)   SpO2 97%   BMI 25.81 kg/m   BP Readings from Last 3 Encounters:  04/20/21 122/72  04/18/21 118/66  01/30/21 128/74    Wt Readings from Last 3 Encounters:  04/20/21 197 lb (89.4 kg)  04/18/21 197 lb (89.4 kg)  01/30/21 203 lb (92.1 kg)    Physical Exam Vitals reviewed.  HENT:     Nose: Nose normal.     Mouth/Throat:     Mouth: Mucous membranes are moist.  Eyes:     General: No scleral icterus.    Conjunctiva/sclera: Conjunctivae normal.  Cardiovascular:     Rate and Rhythm: Normal rate and regular rhythm.     Heart sounds: No murmur heard.     Comments: EKG- NSR Normal EKG Pulmonary:     Effort: Pulmonary effort is normal.     Breath sounds: No stridor. No wheezing, rhonchi or rales.  Abdominal:     General: Abdomen is flat.     Palpations: There is no mass.     Tenderness: There is no abdominal tenderness.  Musculoskeletal:     Cervical back: Neck supple.     Right lower leg: No edema.     Left lower leg: No edema.  Lymphadenopathy:     Cervical: No cervical adenopathy.  Skin:    General: Skin is warm.   Neurological:     General: No focal deficit present.     Mental Status: He is alert.     Lab Results  Component Value Date   WBC 6.1 04/18/2021   HGB 15.0 04/18/2021   HCT 44.2 04/18/2021   PLT 217.0 04/18/2021   GLUCOSE 94 04/19/2021   CHOL 198 04/18/2021   TRIG 165.0 (H) 04/18/2021   HDL 42.60 04/18/2021   LDLDIRECT 87.0 04/03/2016   LDLCALC 123 (H) 04/18/2021   ALT 34 04/18/2021   AST 33 04/18/2021   NA 138 04/19/2021   K 4.7 04/19/2021   CL 104 04/19/2021   CREATININE 1.25 04/19/2021   BUN 32 (H) 04/19/2021   CO2 30 04/19/2021   TSH 1.54 04/18/2021   PSA 7.6 11/30/2020   INR 1.0 04/18/2021   HGBA1C 5.8 04/03/2016    MR PROSTATE W WO CONTRAST  Result Date: 07/25/2020 CLINICAL DATA:  Elevated PSA. EXAM: MR PROSTATE WITHOUT AND WITH CONTRAST TECHNIQUE: Multiplanar multisequence MRI images were obtained of the pelvis centered about the prostate. Pre and Young contrast images were obtained. CONTRAST:  80mL GADAVIST GADOBUTROL 1 MMOL/ML IV SOLN COMPARISON:  02/23/2018 FINDINGS: Prostate: -- Peripheral Zone: Significant image degradation is seen on diffusion imaging due to susceptibility artifact from bilateral hip prostheses. Diffusion imaging is essentially nondiagnostic. A T2 hypointense nodule is seen in the left anterior mid gland which measures 6 x 4 mm on image 13/7. This was not definitely seen on prior study, but cannot be assessed on diffusion imaging. -- Transition/Central Zone: Circumscribed BPH nodules are noted, but no suspicious T2 hypointense nodules identified. Diffusion imaging is nondiagnostic. -- Measurements/Volume:  4.5 x 4.3 x 5.5 cm Transcapsular spread:  Absent Seminal vesicle involvement:  Absent Neurovascular bundle involvement:  Absent Pelvic adenopathy: None visualized Bone metastasis: None visualized Other:  None IMPRESSION: Significant susceptibility artifact from bilateral hip prostheses. Diffusion imaging is nondiagnostic. 6 mm T2 hypointense peripheral  zone nodule in the left anterior mid gland, which cannot be characterized on diffusion imaging. This is best categorized as PI-RADS 3: Intermediate (the presence of clinically significant cancer is equivocal). No evidence of extracapsular extension or pelvic metastatic disease. (I have processed this exam in the DynaCAD application for MR/US fusion-guided biopsy if performed.) Electronically Signed   By: Marlaine Hind M.D.   On: 07/25/2020 15:40    Assessment & Plan:   Jonathan Young was seen today for follow-up.  Diagnoses and all orders for this visit:  Hyperkalemia- There are no changes on his EKG to suggest complications from hyperkalemia.  His potassium level is normal this time so it was likely a lab error.  I have encouraged him to avoid potassium supplements and salt substitutes. -  Basic metabolic panel; Future -     Basic metabolic panel -     Cancel: EKG 12-Lead  Essential hypertension- His blood pressure is well controlled with lifestyle modifications. -     EKG 12-Lead   I am having Jonathan Young maintain his valACYclovir, b complex vitamins, vitamin C, ALPRAZolam, Belsomra, tadalafil, Vitamin D (Ergocalciferol), meloxicam, pantoprazole, and rosuvastatin.  No orders of the defined types were placed in this encounter.    Follow-up: Return in about 6 months (around 10/19/2021).  Scarlette Calico, MD

## 2021-04-19 NOTE — Patient Instructions (Signed)
Hyperkalemia Hyperkalemia occurs when the level of potassium in your blood is too high. Potassium is an important nutrient that helps the muscles and nerves function normally. It affects how the heart works, and it helps keep fluids and minerals balanced in the body. If there is too much potassium in your blood, it can affect your heart's ability to function normally. Potassium is normally removed (excreted) from the body by the kidneys. Hyperkalemia can result from various conditions. It can range from mild to severe. What are the causes? This condition may be caused by:  Taking in too much potassium. You can do this by: ? Using salt substitutes. They contain large amounts of potassium. ? Taking potassium supplements. ? Eating foods that are high in potassium.  Excreting too little potassium. This can happen if: ? Your kidneys are not working properly. Kidney (renal) disease, including short-term or long-term renal failure, is a common cause of hyperkalemia. ? You are taking medicines that lower your excretion of potassium. ? You have Addison's disease. ? You have a urinary tract blockage, such as kidney stones. ? You are on treatment to mechanically clean your blood (dialysis) and you skip a treatment.  Releasing a high amount of potassium from your cells into your blood. This can happen with: ? Injury to muscles (rhabdomyolysis) or other tissues. Most potassium is stored in your muscles. ? Severe burns or infections. ? Acidic blood plasma (acidosis). Acidosis can result from many diseases, such as uncontrolled diabetes. What increases the risk? The following factors may make you more likely to develop this condition:  Kidney disease. This puts you at the highest risk.  Addison's disease. This is a condition where the adrenal glands do not produce enough hormones.  Alcoholism or heavy drug use.  Using certain blood pressure medicines, such as ACE inhibitors, angiotensin II receptor  blockers (ARBs), or potassium-sparing diuretics such as spironolactone.  Severe injury or burn. What are the signs or symptoms? In many cases, there are no symptoms. However, when your potassium level becomes high enough, you may have symptoms such as:  An irregular or very slow heartbeat.  Nausea.  Tiredness (fatigue).  Confusion.  Tingling of your skin or numbness of your hands or feet.  Muscle cramps.  Muscle weakness.  Not being able to move (paralysis). How is this diagnosed? This condition may be diagnosed based on:  Your symptoms and medical history. Your health care provider will ask about your use of prescription and non-prescription drugs.  A physical exam.  Blood tests.  An electrocardiogram (ECG). How is this treated? Treatment depends on the cause and severity of your condition. Treatment may need to be done in the hospital setting. Treatment may include:  IV glucose (sugar) along with insulin to shift potassium out of your blood and into your cells.  A medicine called albuterol to shift potassium out of your blood and into your cells.  Medicines to remove the potassium from your body.  Dialysis to remove the potassium from your body.  Calcium to protect your heart from the effects of high potassium, such as irregular rhythms (arrhythmias). Follow these instructions at home:  Take over-the-counter and prescription medicines only as told by your health care provider.  Do not take any supplements, natural products, herbs, or vitamins without reviewing them with your health care provider. Certain supplements and natural food products contain high amounts of potassium.  Limit your alcohol intake as told by your health care provider.  Do not use drugs.   If you need help quitting, ask your health care provider.  If you have kidney disease, you may need to follow a low-potassium diet. A dietitian can help you learn which foods have high or low amounts of  potassium.  Keep all follow-up visits as told by your health care provider. This is important.   Contact a health care provider if you:  Have an irregular or very slow heartbeat.  Feel light-headed.  Feel weak.  Are nauseous.  Have tingling or numbness in your hands or feet. Get help right away if you:  Have shortness of breath.  Have chest pain or discomfort.  Pass out.  Have muscle paralysis. Summary  Hyperkalemia occurs when the level of potassium in your blood is too high.  This condition may be caused by taking in too much potassium, excreting too little potassium, or releasing a high amount of potassium from your cells into your blood.  Hyperkalemia can result from many underlying conditions, especially chronic kidney disease, or from taking certain medicines.  Treatment of hyperkalemia may include medicine to shift potassium out of your blood and into your cells or to remove the potassium from your body.  If you have kidney disease, you may need to follow a low-potassium diet. A dietitian can help you learn which foods have high or low amounts of potassium. This information is not intended to replace advice given to you by your health care provider. Make sure you discuss any questions you have with your health care provider. Document Revised: 05/11/2020 Document Reviewed: 05/11/2020 Elsevier Patient Education  2021 [employer]  

## 2021-05-14 ENCOUNTER — Other Ambulatory Visit: Payer: Self-pay | Admitting: Internal Medicine

## 2021-05-14 DIAGNOSIS — M8949 Other hypertrophic osteoarthropathy, multiple sites: Secondary | ICD-10-CM

## 2021-05-14 DIAGNOSIS — M159 Polyosteoarthritis, unspecified: Secondary | ICD-10-CM

## 2021-05-18 ENCOUNTER — Encounter: Payer: Self-pay | Admitting: Internal Medicine

## 2021-05-18 ENCOUNTER — Other Ambulatory Visit (HOSPITAL_COMMUNITY): Payer: Self-pay

## 2021-05-18 ENCOUNTER — Other Ambulatory Visit: Payer: Self-pay | Admitting: Internal Medicine

## 2021-05-18 DIAGNOSIS — J208 Acute bronchitis due to other specified organisms: Secondary | ICD-10-CM | POA: Insufficient documentation

## 2021-05-18 DIAGNOSIS — U071 COVID-19: Secondary | ICD-10-CM

## 2021-05-18 MED ORDER — PAXLOVID 20 X 150 MG & 10 X 100MG PO TBPK
3.0000 | ORAL_TABLET | Freq: Two times a day (BID) | ORAL | 0 refills | Status: DC
  Filled 2021-05-18: qty 30, 5d supply, fill #0

## 2021-05-18 MED ORDER — NIRMATRELVIR/RITONAVIR (PAXLOVID) TABLET (RENAL DOSING)
2.0000 | ORAL_TABLET | Freq: Two times a day (BID) | ORAL | 0 refills | Status: AC
  Filled 2021-05-18: qty 20, 5d supply, fill #0

## 2021-05-23 ENCOUNTER — Encounter: Payer: Self-pay | Admitting: Internal Medicine

## 2021-05-23 ENCOUNTER — Other Ambulatory Visit: Payer: Self-pay | Admitting: Internal Medicine

## 2021-05-23 ENCOUNTER — Other Ambulatory Visit (HOSPITAL_COMMUNITY): Payer: Self-pay

## 2021-05-23 DIAGNOSIS — J208 Acute bronchitis due to other specified organisms: Secondary | ICD-10-CM

## 2021-05-23 DIAGNOSIS — U071 COVID-19: Secondary | ICD-10-CM

## 2021-05-24 ENCOUNTER — Telehealth: Payer: Self-pay | Admitting: Physician Assistant

## 2021-05-24 NOTE — Telephone Encounter (Signed)
Called to discuss with patient about COVID-19 symptoms and the use of one of the available treatments for those with mild to moderate Covid symptoms and at a high risk of hospitalization.  Pt appears to qualify for outpatient treatment due to co-morbid conditions and/or a member of an at-risk group in accordance with the FDA Emergency Use Authorization.     Pt is not qualified for the monoclonal antibody infusion or oral anti viral due to symptoms onset. Preventative practices reviewed. Patient verbalized understanding.  [place], [place]  05/24/2021 11:41 AM

## 2021-05-29 DIAGNOSIS — R972 Elevated prostate specific antigen [PSA]: Secondary | ICD-10-CM | POA: Diagnosis not present

## 2021-06-06 DIAGNOSIS — N401 Enlarged prostate with lower urinary tract symptoms: Secondary | ICD-10-CM | POA: Diagnosis not present

## 2021-06-06 DIAGNOSIS — R3915 Urgency of urination: Secondary | ICD-10-CM | POA: Diagnosis not present

## 2021-06-06 DIAGNOSIS — R972 Elevated prostate specific antigen [PSA]: Secondary | ICD-10-CM | POA: Diagnosis not present

## 2021-06-27 ENCOUNTER — Encounter: Payer: Self-pay | Admitting: Internal Medicine

## 2021-06-28 NOTE — Telephone Encounter (Signed)
   Patient is requesting lab work to check Testosterone. He can be reached at [phone]. Please advise

## 2021-07-02 ENCOUNTER — Other Ambulatory Visit: Payer: Self-pay | Admitting: Internal Medicine

## 2021-07-02 DIAGNOSIS — E291 Testicular hypofunction: Secondary | ICD-10-CM

## 2021-07-09 ENCOUNTER — Other Ambulatory Visit: Payer: Self-pay | Admitting: Internal Medicine

## 2021-07-09 DIAGNOSIS — F411 Generalized anxiety disorder: Secondary | ICD-10-CM

## 2021-07-17 DIAGNOSIS — Z96641 Presence of right artificial hip joint: Secondary | ICD-10-CM | POA: Diagnosis not present

## 2021-07-25 ENCOUNTER — Encounter: Payer: Self-pay | Admitting: Internal Medicine

## 2021-07-30 ENCOUNTER — Other Ambulatory Visit: Payer: Self-pay | Admitting: Internal Medicine

## 2021-07-30 DIAGNOSIS — R911 Solitary pulmonary nodule: Secondary | ICD-10-CM

## 2021-08-03 ENCOUNTER — Other Ambulatory Visit: Payer: Self-pay

## 2021-08-03 ENCOUNTER — Ambulatory Visit (HOSPITAL_COMMUNITY)
Admission: RE | Admit: 2021-08-03 | Discharge: 2021-08-03 | Disposition: A | Discharge status: Home / Self Care | Payer: PPO | Source: Ambulatory Visit | Attending: Family Medicine | Admitting: Family Medicine

## 2021-08-03 ENCOUNTER — Encounter (HOSPITAL_COMMUNITY): Payer: Self-pay

## 2021-08-03 VITALS — BP 144/89 | HR 79 | Temp 98.4°F | Resp 18

## 2021-08-03 DIAGNOSIS — S76311A Strain of muscle, fascia and tendon of the posterior muscle group at thigh level, right thigh, initial encounter: Secondary | ICD-10-CM

## 2021-08-03 DIAGNOSIS — M722 Plantar fascial fibromatosis: Secondary | ICD-10-CM

## 2021-08-03 MED ORDER — DEXAMETHASONE SODIUM PHOSPHATE 10 MG/ML IJ SOLN
INTRAMUSCULAR | Status: AC
  Filled 2021-08-03: qty 1

## 2021-08-03 MED ORDER — DEXAMETHASONE SODIUM PHOSPHATE 10 MG/ML IJ SOLN
10.0000 mg | Freq: Once | INTRAMUSCULAR | Status: AC
  Administered 2021-08-03: 10 mg via INTRAMUSCULAR

## 2021-08-03 MED ORDER — CYCLOBENZAPRINE HCL 5 MG PO TABS: 5.0000 mg | ORAL_TABLET | Freq: Three times a day (TID) | ORAL | 0 refills | Status: DC | PRN

## 2021-08-03 NOTE — ED Provider Notes (Signed)
[place]    CSN: [id number] Arrival date & time: 08/03/21  [id number]      History   Chief Complaint Chief Complaint  Patient presents with   APPOINTMENT : Right Leg Pain    HPI Jonathan Young is a 70 y.o. male.   Patient presenting today with 10-day history of right hamstring pain, swelling, bruising after he was helping his nephew move a heavy piece of furniture and stepped off a step wrong.  He states he heard a popping sound and had instant pain in the leg.  Had some bruising above the knee posteriorly initially for the first several days which has resolved.  Has full range of motion, able to walk without issue, no numbness, tingling, weakness, joint swelling or pain.  Has been icing the area with mild temporary relief.  Taking Aleve at bedtime which does take the edge off.  He also states has been dealing with [place] fasciitis off and on in the left foot for the past month or so, with most recent flareup of pain yesterday.  Has stretches that he does at home and has a plantar fasciitis brace that he wears from time to time.   Past Medical History:  Diagnosis Date   Complication of anesthesia    sore throat from tube   Dysrhythmia    PVC'S   GERD (gastroesophageal reflux disease)    Hypercholesteremia    Osteoarthritis    Umbilical hernia     Patient Active Problem List   Diagnosis Date Noted   Hyperkalemia 04/19/2021   Psychophysiological insomnia 06/06/2020   Fatty infiltration of liver 03/02/2020   Nodule of lower lobe of right lung 02/14/2020   Alcohol-induced chronic pancreatitis ([place]) 06/16/2019   Maxillary sinusitis, chronic 06/29/2017   CRI (chronic renal insufficiency), stage 3 (moderate) ([place]) 04/07/2017   Pure hyperglyceridemia 04/03/2017   Essential hypertension 11/07/2016   GAD (generalized anxiety disorder) 10/17/2015   Erectile dysfunction of organic origin 10/10/2015   PSA elevation 01/10/2015   High risk homosexual behavior 12/29/2014    Routine general medical examination at a health care facility 12/29/2014   GERD (gastroesophageal reflux disease) 04/23/2011   IBS (irritable bowel syndrome) 04/23/2011   Vitamin D deficiency 01/16/2011   DUPUYTREN'S CONTRACTURE 01/16/2011   Hypogonadism male 07/25/2010   IDIOPATHIC OSTEOPOROSIS 11/01/2008   ANXIETY DEPRESSION 07/26/2008   Vitamin B 12 deficiency 03/22/2008   Osteoarthritis 08/25/2007   Hyperlipidemia with target LDL less than 130 08/03/2007    Past Surgical History:  Procedure Laterality Date   CARPAL TUNNEL RELEASE Left 05/23/2018   CARPAL TUNNEL RELEASE Right 11/22/2018   CERVICAL SPINE SURGERY     HEMORRHOID SURGERY  1982   INSERTION OF MESH N/A 07/27/2020   Procedure: INSERTION OF MESH;  Surgeon: Erroll Luna, MD;  Location: [facility];  Service: General;  Laterality: N/A;   SHOULDER SURGERY     BIL   TONSILLECTOMY AND ADENOIDECTOMY     TOTAL HIP ARTHROPLASTY     RT TOAL HIP   TOTAL HIP ARTHROPLASTY  06/01/2012   Procedure: TOTAL HIP ARTHROPLASTY ANTERIOR APPROACH;  Surgeon: Mauri Pole, MD;  Location: [facility];  Service: Orthopedics;  Laterality: Left;   UMBILICAL HERNIA REPAIR N/A 07/27/2020   Procedure: UMBILICAL HERNIA REPAIR WITH MESH;  Surgeon: Erroll Luna, MD;  Location: [facility];  Service: General;  Laterality: N/A;       Home Medications    Prior to Admission medications  Medication Sig Start Date End Date Taking? Authorizing Provider  cyclobenzaprine (FLEXERIL) 5 MG tablet Take 1 tablet (5 mg total) by mouth 3 (three) times daily as needed for muscle spasms. Do not drink alcohol or drive while taking this medication.  May cause drowsiness. 08/03/21  Yes Volney American, PA-C  ALPRAZolam Duanne Moron) 0.5 MG tablet TAKE 1 TABLET(0.5 MG) BY MOUTH AT BEDTIME AS NEEDED FOR ANXIETY 07/09/21   Janith Lima, MD  b complex vitamins tablet Take 1 tablet by mouth daily.    [provider]  meloxicam (MOBIC)  15 MG tablet TAKE 1 TABLET(15 MG) BY MOUTH DAILY 05/14/21   Janith Lima, MD  pantoprazole (PROTONIX) 40 MG tablet Take 1 tablet (40 mg total) by mouth daily. 04/18/21   Janith Lima, MD  rosuvastatin (CRESTOR) 10 MG tablet TAKE 1 TABLET(10 MG) BY MOUTH DAILY 04/18/21   Janith Lima, MD  Suvorexant (BELSOMRA) 15 MG TABS Take 1 tablet by mouth at bedtime as needed. 06/06/20   Janith Lima, MD  tadalafil (CIALIS) 20 MG tablet Take 1 tablet (20 mg total) by mouth See admin instructions. Take 20 mg by mouth one hour prior to intercourse- maximum of 20 mg (1 tablet) in 24 hours 07/10/20   Janith Lima, MD  valACYclovir (VALTREX) 1000 MG tablet Take 0.5 tablets (500 mg total) by mouth daily. 08/26/18   Janith Lima, MD  vitamin C (ASCORBIC ACID) 500 MG tablet Take 500 mg by mouth daily.    [provider]  Vitamin D, Ergocalciferol, (DRISDOL) 1.25 MG (50000 UNIT) CAPS capsule TAKE ONE CAPSULE BY MOUTH EVERY SATURDAY 07/19/20   Janith Lima, MD    Family History Family History  Problem Relation Age of Onset   Emphysema Mother    Colon cancer Neg Hx    Esophageal cancer Neg Hx    Rectal cancer Neg Hx    Stomach cancer Neg Hx     Social History Social History   Tobacco Use   Smoking status: Former    Types: Cigarettes    Quit date: 05/27/1981    Years since quitting: 40.2   Smokeless tobacco: Never  Vaping Use   Vaping Use: Never used  Substance Use Topics   Alcohol use: Yes    Alcohol/week: 35.0 standard drinks    Types: 35 Shots of liquor per week    Comment: wine nightly   Drug use: No     Allergies   Vytorin [ezetimibe-simvastatin]   Review of Systems Review of Systems Per HPI  Physical Exam Triage Vital Signs ED Triage Vitals  Enc Vitals Group     BP 08/03/21 1020 (!) 144/89     Pulse Rate 08/03/21 1020 79     Resp 08/03/21 1020 18     Temp 08/03/21 1020 98.4 F (36.9 C)     Temp Source 08/03/21 1020 Oral     SpO2 08/03/21 1020 96 %      Weight --      Height --      Head Circumference --      Peak Flow --      Pain Score 08/03/21 1023 6     Pain Loc --      Pain Edu? --      Excl. in [place]? --    No data found.  Updated Vital Signs BP (!) 144/89 (BP Location: Right Arm)   Pulse 79   Temp 98.4 F (36.9 C) (  Oral)   Resp 18   SpO2 96%   Visual Acuity Right Eye Distance:   Left Eye Distance:   Bilateral Distance:    Right Eye Near:   Left Eye Near:    Bilateral Near:     Physical Exam Vitals and nursing note reviewed.  Constitutional:      Appearance: Normal appearance.  HENT:     Head: Atraumatic.  Eyes:     Extraocular Movements: Extraocular movements intact.     Conjunctiva/sclera: Conjunctivae normal.  Cardiovascular:     Rate and Rhythm: Normal rate and regular rhythm.  Pulmonary:     Effort: Pulmonary effort is normal.     Breath sounds: Normal breath sounds.  Musculoskeletal:        General: Tenderness and signs of injury present. No swelling or deformity. Normal range of motion.     Cervical back: Normal range of motion and neck supple.     Comments: Right posterior upper leg tender to palpation, particular distal hamstring just above knee.  No significant swelling, point tenderness, joint pain and no bony deformities palpable on exam.  Range of motion full and intact, negative straight leg raise bilateral lower extremities, no midline spinal tenderness palpation diffusely  Skin:    General: Skin is warm and dry.     Findings: No bruising, erythema or lesion.  Neurological:     General: No focal deficit present.     Mental Status: He is oriented to person, place, and time.     Motor: No weakness.     Coordination: Coordination normal.     Comments: Bilateral lower extremities neurovascularly intact  Psychiatric:        Mood and Affect: Mood normal.        Thought Content: Thought content normal.        Judgment: Judgment normal.   UC Treatments / Results  Labs (all labs ordered are  listed, but only abnormal results are displayed) Labs Reviewed - No data to display  EKG   Radiology No results found.  Procedures Procedures (including critical care time)  Medications Ordered in UC Medications  dexamethasone (DECADRON) injection 10 mg (10 mg Intramuscular Given 08/03/21 1051)   Initial Impression / Assessment and Plan / UC Course  I have reviewed the triage vital signs and the nursing notes.  Pertinent labs & imaging results that were available during my care of the patient were reviewed by me and considered in my medical decision making (see chart for details).     Suspect hamstring strain, not improving with supportive home care.  IM Decadron given in clinic today, Flexeril sent to pharmacy and stretches reviewed.  Suspect the Decadron will also help his plantar fasciitis.  Handout of stretches and exercises given additionally.  Follow-up if worsening or not resolving.  Final Clinical Impressions(s) / UC Diagnoses   Final diagnoses:  Strain of right hamstring, initial encounter  Plantar fasciitis of left foot   Discharge Instructions   None    ED Prescriptions     Medication Sig Dispense Auth. Provider   cyclobenzaprine (FLEXERIL) 5 MG tablet Take 1 tablet (5 mg total) by mouth 3 (three) times daily as needed for muscle spasms. Do not drink alcohol or drive while taking this medication.  May cause drowsiness. 15 tablet Volney American, [place]      PDMP not reviewed this encounter.   Volney American, [place] 08/03/21 1057

## 2021-08-03 NOTE — ED Triage Notes (Signed)
Pt presents with right leg pain X 2 weeks after helping a friend move furniture; pt states he was helping a friend carry heavy furniture up stairs and felt a pop in the back of right leg and felt pain.

## 2021-08-07 ENCOUNTER — Ambulatory Visit (INDEPENDENT_AMBULATORY_CARE_PROVIDER_SITE_OTHER)
Admission: RE | Admit: 2021-08-07 | Discharge: 2021-08-07 | Disposition: A | Discharge status: Home / Self Care | Payer: PPO | Source: Ambulatory Visit | Attending: Internal Medicine | Admitting: Internal Medicine

## 2021-08-07 ENCOUNTER — Other Ambulatory Visit: Payer: Self-pay

## 2021-08-07 DIAGNOSIS — R911 Solitary pulmonary nodule: Secondary | ICD-10-CM | POA: Diagnosis not present

## 2021-08-07 DIAGNOSIS — I700 Atherosclerosis of aorta: Secondary | ICD-10-CM | POA: Diagnosis not present

## 2021-08-07 DIAGNOSIS — R918 Other nonspecific abnormal finding of lung field: Secondary | ICD-10-CM | POA: Diagnosis not present

## 2021-08-08 ENCOUNTER — Encounter: Payer: Self-pay | Admitting: Internal Medicine

## 2021-08-08 ENCOUNTER — Other Ambulatory Visit: Payer: Self-pay | Admitting: Internal Medicine

## 2021-08-08 DIAGNOSIS — S76309A Unspecified injury of muscle, fascia and tendon of the posterior muscle group at thigh level, unspecified thigh, initial encounter: Secondary | ICD-10-CM | POA: Insufficient documentation

## 2021-08-09 ENCOUNTER — Encounter: Payer: Self-pay | Admitting: Internal Medicine

## 2021-08-09 NOTE — Progress Notes (Signed)
[facility] [place] Phone: [phone] Subjective:   Jonathan Young, am serving as a scribe for Dr. Hulan Saas.  This visit occurred during the SARS-CoV-2 public health emergency.  Safety protocols were in place, including screening questions prior to the visit, additional usage of staff PPE, and extensive cleaning of exam room while observing appropriate contact time as indicated for disinfecting solutions.    I'm seeing this patient by the request  of:  Janith Lima, MD  CC: R Hamstring pain  [id number]  Jonathan Young is a 70 y.o. male coming in with complaint of R distal hamstring injury from the beginning of August.  Patient was initially seen in the urgent care on August 12 after stepping off of a step wrong while helping his nephew move a heavy piece of furniture and heard a pop.  Patient was given a injection of steroid as well as Flexerilat the UC and was referred here for follow-up.  Today, pt reports having shot of steroid and mm relaxers after trip to UC. Painful to sit on glute due to pain and with motion pain over the medial distal hamstring.         Past Medical History:  Diagnosis Date   Complication of anesthesia    sore throat from tube   Dysrhythmia    PVC'S   GERD (gastroesophageal reflux disease)    Hypercholesteremia    Osteoarthritis    Umbilical hernia    Past Surgical History:  Procedure Laterality Date   CARPAL TUNNEL RELEASE Left 05/23/2018   CARPAL TUNNEL RELEASE Right 11/22/2018   CERVICAL SPINE SURGERY     HEMORRHOID SURGERY  1982   INSERTION OF MESH N/A 07/27/2020   Procedure: INSERTION OF MESH;  Surgeon: Erroll Luna, MD;  Location: [facility];  Service: General;  Laterality: N/A;   SHOULDER SURGERY     BIL   TONSILLECTOMY AND ADENOIDECTOMY     TOTAL HIP ARTHROPLASTY     RT TOAL HIP   TOTAL HIP ARTHROPLASTY  06/01/2012   Procedure: TOTAL HIP  ARTHROPLASTY ANTERIOR APPROACH;  Surgeon: Mauri Pole, MD;  [facility];  Service: Orthopedics;  Laterality: Left;   UMBILICAL HERNIA REPAIR N/A 07/27/2020   Procedure: UMBILICAL HERNIA REPAIR WITH MESH;  Surgeon: Erroll Luna, MD;  Location: [facility];  Service: General;  Laterality: N/A;   Social History   Socioeconomic History   Marital status: Single    Spouse name: Not on file   Number of children: Not on file   Years of education: Not on file   Highest education level: Not on file  Occupational History   Not on file  Tobacco Use   Smoking status: Former    Types: Cigarettes    Quit date: 05/27/1981    Years since quitting: 40.2   Smokeless tobacco: Never  Vaping Use   Vaping Use: Never used  Substance and Sexual Activity   Alcohol use: Yes    Alcohol/week: 35.0 standard drinks    Types: 35 Shots of liquor per week    Comment: wine nightly   Drug use: Young   Sexual activity: Yes  Other Topics Concern   Not on file  Social History Narrative   Not on file   Social Determinants of Health   Financial Resource Strain: Not on file  Food Insecurity: Not on file  Transportation Needs: Not on file  Physical Activity:  Not on file  Stress: Not on file  Social Connections: Not on file   Allergies  Allergen Reactions   Vytorin [Ezetimibe-Simvastatin] Other (See Comments)    Muscle aches   Family History  Problem Relation Age of Onset   Emphysema Mother    Colon cancer Neg Hx    Esophageal cancer Neg Hx    Rectal cancer Neg Hx    Stomach cancer Neg Hx      Current Outpatient Medications (Cardiovascular):    rosuvastatin (CRESTOR) 10 MG tablet, TAKE 1 TABLET(10 MG) BY MOUTH DAILY   tadalafil (CIALIS) 20 MG tablet, Take 1 tablet (20 mg total) by mouth See admin instructions. Take 20 mg by mouth one hour prior to intercourse- maximum of 20 mg (1 tablet) in 24 hours   Current Outpatient Medications (Analgesics):    meloxicam (MOBIC) 15 MG  tablet, TAKE 1 TABLET(15 MG) BY MOUTH DAILY   Current Outpatient Medications (Other):    ALPRAZolam (XANAX) 0.5 MG tablet, TAKE 1 TABLET(0.5 MG) BY MOUTH AT BEDTIME AS NEEDED FOR ANXIETY   b complex vitamins tablet, Take 1 tablet by mouth daily.   cyclobenzaprine (FLEXERIL) 5 MG tablet, Take 1 tablet (5 mg total) by mouth 3 (three) times daily as needed for muscle spasms. Do not drink alcohol or drive while taking this medication.  May cause drowsiness.   pantoprazole (PROTONIX) 40 MG tablet, Take 1 tablet (40 mg total) by mouth daily.   Suvorexant (BELSOMRA) 15 MG TABS, Take 1 tablet by mouth at bedtime as needed.   valACYclovir (VALTREX) 1000 MG tablet, Take 0.5 tablets (500 mg total) by mouth daily.   vitamin C (ASCORBIC ACID) 500 MG tablet, Take 500 mg by mouth daily.   Vitamin D, Ergocalciferol, (DRISDOL) 1.25 MG (50000 UNIT) CAPS capsule, TAKE ONE CAPSULE BY MOUTH EVERY SATURDAY   Reviewed prior external information including notes and imaging from  primary care provider As well as notes that were available from care everywhere and other healthcare systems.  Past medical history, social, surgical and family history all reviewed in electronic medical record.  Young pertanent information unless stated regarding to the chief complaint.   Review of Systems:  Young headache, visual changes, nausea, vomiting, diarrhea, constipation, dizziness, abdominal pain, skin rash, fevers, chills, night sweats, weight loss, swollen lymph nodes, body aches, joint swelling, chest pain, shortness of breath, mood changes. POSITIVE muscle aches  Objective  Blood pressure 112/86, pulse (!) 59, height '6\' 1"'$  (1.854 m), weight 198 lb (89.8 kg), SpO2 98 %.   General: Young apparent distress alert and oriented x3 mood and affect normal, dressed appropriately.  HEENT: Pupils equal, extraocular movements intact  Respiratory: Patient's speak in full sentences and does not appear short of breath  Cardiovascular: Young lower  extremity edema, non tender, Young erythema  Gait normal with good balance and coordination.  MSK: Right leg exam shows the patient does have stiffness noted with straight leg test.  Tender to palpation to palpation of the muscle belly of the hamstring.  Patient does have some mild pain with resisted flexion.  Young pain over the ischial area.  Young pain at the ankle or in the popliteal fossa.  Limited muscular skeletal ultrasound was performed and interpreted by Hulan Saas, M  Limited ultrasound shows the patient does have a superficial tear of the hamstring muscle.  Patient does have some mild what appears to be more of a fascial herniation of the muscle potentially as well with scar tissue formation  proximal to this area.  Seems to be a subacute tear with some mild hypoechoic changes still surrounding the area.    Impression and Recommendations:    The above documentation has been reviewed and is accurate and complete Jonathan Pulley, DO

## 2021-08-10 ENCOUNTER — Ambulatory Visit: Payer: Self-pay

## 2021-08-10 ENCOUNTER — Other Ambulatory Visit: Payer: Self-pay

## 2021-08-10 ENCOUNTER — Ambulatory Visit: Payer: PPO | Admitting: Family Medicine

## 2021-08-10 ENCOUNTER — Encounter: Payer: Self-pay | Admitting: Family Medicine

## 2021-08-10 VITALS — BP 112/86 | HR 59 | Ht 73.0 in | Wt 198.0 lb

## 2021-08-10 DIAGNOSIS — S76309A Unspecified injury of muscle, fascia and tendon of the posterior muscle group at thigh level, unspecified thigh, initial encounter: Secondary | ICD-10-CM | POA: Diagnosis not present

## 2021-08-10 DIAGNOSIS — S76311A Strain of muscle, fascia and tendon of the posterior muscle group at thigh level, right thigh, initial encounter: Secondary | ICD-10-CM | POA: Diagnosis not present

## 2021-08-10 DIAGNOSIS — M25561 Pain in right knee: Secondary | ICD-10-CM | POA: Diagnosis not present

## 2021-08-10 NOTE — Assessment & Plan Note (Signed)
Partial superficial tearing noted.  Seems to be more in the muscle belly.  He does have subacute changes already with lisinopril.  Discussed high compression sleeve, and held on nitroglycerin secondary to history of migraines.  Discussed heel lift and given a sample.  Exercises given and patient work with Product/process development scientist.  Follow-up again in 4 to 6 months

## 2021-08-10 NOTE — Patient Instructions (Addendum)
  PT [address] Heel lifts Thigh compression  Continue Vit D See me in 4-6 weeks

## 2021-08-11 ENCOUNTER — Other Ambulatory Visit: Payer: Self-pay | Admitting: Internal Medicine

## 2021-08-11 DIAGNOSIS — M159 Polyosteoarthritis, unspecified: Secondary | ICD-10-CM

## 2021-08-11 DIAGNOSIS — M150 Primary generalized (osteo)arthritis: Secondary | ICD-10-CM

## 2021-08-11 DIAGNOSIS — M8949 Other hypertrophic osteoarthropathy, multiple sites: Secondary | ICD-10-CM

## 2021-08-15 ENCOUNTER — Other Ambulatory Visit (HOSPITAL_COMMUNITY): Payer: Self-pay

## 2021-08-20 DIAGNOSIS — R1032 Left lower quadrant pain: Secondary | ICD-10-CM | POA: Diagnosis not present

## 2021-08-21 DIAGNOSIS — Z23 Encounter for immunization: Secondary | ICD-10-CM | POA: Diagnosis not present

## 2021-08-22 ENCOUNTER — Encounter: Payer: Self-pay | Admitting: Internal Medicine

## 2021-08-23 ENCOUNTER — Other Ambulatory Visit: Payer: Self-pay | Admitting: Internal Medicine

## 2021-08-23 DIAGNOSIS — J410 Simple chronic bronchitis: Secondary | ICD-10-CM

## 2021-08-23 MED ORDER — UMECLIDINIUM-VILANTEROL 62.5-25 MCG/INH IN AEPB: 1.0000 | INHALATION_SPRAY | Freq: Every day | RESPIRATORY_TRACT | 1 refills | Status: DC

## 2021-08-31 ENCOUNTER — Other Ambulatory Visit: Payer: Self-pay | Admitting: Surgery

## 2021-08-31 DIAGNOSIS — R1032 Left lower quadrant pain: Secondary | ICD-10-CM

## 2021-09-06 NOTE — Progress Notes (Signed)
[facility] [place] Phone: [phone] Subjective:   Rito Ehrlich, am serving as a scribe for Dr. Hulan Saas.  I'm seeing this patient by the request  of:  Janith Lima, MD  CC: right hamstring injury   [id number]  08/10/2021 Partial superficial tearing noted.  Seems to be more in the muscle belly.  He does have subacute changes already with lisinopril.  Discussed high compression sleeve, and held on nitroglycerin secondary to history of migraines.  Discussed heel lift and given a sample.  Exercises given and patient work with Product/process development scientist.  Follow-up again in 4 to 6 months  Updated 09/11/2021 Jamarcus Perteet Brenn is a 70 y.o. male coming in with complaint of hamstring pain.partial tearing noted. Started on compression, HEP. Patient states patient states that he doesn't feel pain at all unless he jumps or starts to hurry, but the pain is not terrible. Patient states that he has had L wrist issues for about three months after hurting it doing curls. Wanted to know if it could be arthritis or what he could do to help with the pain especially when working out             Past Medical History:  Diagnosis Date   Complication of anesthesia    sore throat from tube   Dysrhythmia    PVC'S   GERD (gastroesophageal reflux disease)    Hypercholesteremia    Osteoarthritis    Umbilical hernia    Past Surgical History:  Procedure Laterality Date   CARPAL TUNNEL RELEASE Left 05/23/2018   CARPAL TUNNEL RELEASE Right 11/22/2018   CERVICAL [place]   INSERTION OF MESH N/A 07/27/2020   Procedure: INSERTION OF MESH;  Surgeon: Erroll Luna, MD;  Location: [facility];  Service: General;  Laterality: N/A;   SHOULDER SURGERY     BIL   TONSILLECTOMY AND ADENOIDECTOMY     TOTAL HIP ARTHROPLASTY     RT TOAL HIP   TOTAL HIP ARTHROPLASTY  06/01/2012   Procedure: TOTAL HIP  ARTHROPLASTY ANTERIOR APPROACH;  Surgeon: Mauri Pole, MD;  [facility];  Service: Orthopedics;  Laterality: Left;   UMBILICAL HERNIA REPAIR N/A 07/27/2020   Procedure: UMBILICAL HERNIA REPAIR WITH MESH;  Surgeon: Erroll Luna, MD;  [facility];  Service: General;  Laterality: N/A;   Social History   Socioeconomic History   Marital status: Single    Spouse name: Not on file   Number of children: Not on file   Years of education: Not on file   Highest education level: Not on file  Occupational History   Not on file  Tobacco Use   Smoking status: Former    Types: Cigarettes    Quit date: 05/27/1981    Years since quitting: 40.3   Smokeless tobacco: Never  Vaping Use   Vaping Use: Never used  Substance and Sexual Activity   Alcohol use: Yes    Alcohol/week: 35.0 standard drinks    Types: 35 Shots of liquor per week    Comment: wine nightly   Drug use: No   Sexual activity: Yes  Other Topics Concern   Not on file  Social History Narrative   Not on file   Social Determinants of Health   Financial Resource Strain: Not on file  Food Insecurity: Not on file  Transportation Needs: Not on file  Physical Activity: Not on file  Stress: Not on file  Social Connections: Not on file   Allergies  Allergen Reactions   Vytorin [Ezetimibe-Simvastatin] Other (See Comments)    Muscle aches   Family History  Problem Relation Age of Onset   Emphysema Mother    Colon cancer Neg Hx    Esophageal cancer Neg Hx    Rectal cancer Neg Hx    Stomach cancer Neg Hx      Current Outpatient Medications (Cardiovascular):    rosuvastatin (CRESTOR) 10 MG tablet, TAKE 1 TABLET(10 MG) BY MOUTH DAILY   tadalafil (CIALIS) 20 MG tablet, Take 1 tablet (20 mg total) by mouth See admin instructions. Take 20 mg by mouth one hour prior to intercourse- maximum of 20 mg (1 tablet) in 24 hours  Current Outpatient Medications (Respiratory):    umeclidinium-vilanterol (ANORO  ELLIPTA) 62.5-25 MCG/INH AEPB, Inhale 1 puff into the lungs daily at 6 (six) AM.  Current Outpatient Medications (Analgesics):    meloxicam (MOBIC) 15 MG tablet, TAKE 1 TABLET(15 MG) BY MOUTH DAILY   Current Outpatient Medications (Other):    ALPRAZolam (XANAX) 0.5 MG tablet, TAKE 1 TABLET(0.5 MG) BY MOUTH AT BEDTIME AS NEEDED FOR ANXIETY   b complex vitamins tablet, Take 1 tablet by mouth daily.   cyclobenzaprine (FLEXERIL) 5 MG tablet, Take 1 tablet (5 mg total) by mouth 3 (three) times daily as needed for muscle spasms. Do not drink alcohol or drive while taking this medication.  May cause drowsiness.   pantoprazole (PROTONIX) 40 MG tablet, Take 1 tablet (40 mg total) by mouth daily.   Suvorexant (BELSOMRA) 15 MG TABS, Take 1 tablet by mouth at bedtime as needed.   valACYclovir (VALTREX) 1000 MG tablet, Take 0.5 tablets (500 mg total) by mouth daily.   vitamin C (ASCORBIC ACID) 500 MG tablet, Take 500 mg by mouth daily.   Vitamin D, Ergocalciferol, (DRISDOL) 1.25 MG (50000 UNIT) CAPS capsule, TAKE ONE CAPSULE BY MOUTH EVERY SATURDAY   Reviewed prior external information including notes and imaging from  primary care provider As well as notes that were available from care everywhere and other healthcare systems.  Past medical history, social, surgical and family history all reviewed in electronic medical record.  No pertanent information unless stated regarding to the chief complaint.   Review of Systems:  No headache, visual changes, nausea, vomiting, diarrhea, constipation, dizziness, abdominal pain, skin rash, fevers, chills, night sweats, weight loss, swollen lymph nodes, body aches, joint swelling, chest pain, shortness of breath, mood changes. POSITIVE muscle aches  Objective  Blood pressure 130/80, pulse 72, height '6\' 1"'$  (1.854 m), weight 206 lb (93.4 kg), SpO2 98 %.   General: No apparent distress alert and oriented x3 mood and affect normal, dressed appropriately.  HEENT:  Pupils equal, extraocular movements intact  Respiratory: Patient's speak in full sentences and does not appear short of breath  Cardiovascular: No lower extremity edema, non tender, no erythema  Gait normal with good balance and coordination.  MSK: Patient's hamstring does not have any tenderness.  Full strength noted.  Patient's left wrist exam has some very mild discomfort over the radial carpal joint.  Good range of motion no noted.  Negative Finkelstein's test.  Negative grind test of the [facility] joint.  Limited muscular skeletal ultrasound was performed and interpreted by Hulan Saas, M  Limited ultrasound shows the patient does have some very mild arthritic changes in the radiocarpal joint noted.  Very mild hypoechoic changes of the  abductor pollicis longus tendon sheath.  No significant arthritic changes of the [facility] joint.  Mild degenerative changes noted at the TF[facility]. Impression: Nonspecific findings of wrist.    Impression and Recommendations:     The above documentation has been reviewed and is accurate and complete Lyndal Pulley, DO

## 2021-09-11 ENCOUNTER — Other Ambulatory Visit: Payer: Self-pay

## 2021-09-11 ENCOUNTER — Ambulatory Visit (INDEPENDENT_AMBULATORY_CARE_PROVIDER_SITE_OTHER): Payer: PPO

## 2021-09-11 ENCOUNTER — Encounter: Payer: Self-pay | Admitting: Family Medicine

## 2021-09-11 ENCOUNTER — Ambulatory Visit: Payer: PPO | Admitting: Family Medicine

## 2021-09-11 ENCOUNTER — Ambulatory Visit: Payer: Self-pay

## 2021-09-11 VITALS — BP 130/80 | HR 72 | Ht 73.0 in | Wt 206.0 lb

## 2021-09-11 DIAGNOSIS — M25532 Pain in left wrist: Secondary | ICD-10-CM

## 2021-09-11 DIAGNOSIS — M7989 Other specified soft tissue disorders: Secondary | ICD-10-CM | POA: Diagnosis not present

## 2021-09-11 DIAGNOSIS — S76309A Unspecified injury of muscle, fascia and tendon of the posterior muscle group at thigh level, unspecified thigh, initial encounter: Secondary | ICD-10-CM | POA: Diagnosis not present

## 2021-09-11 NOTE — Assessment & Plan Note (Signed)
Much improved already.  We will progress as tolerated.  Still avoid any type of high impact exercises again for another 1 to 2 months.  Continue the compression.  Follow-up if any worsening pain.

## 2021-09-11 NOTE — Patient Instructions (Signed)
Good to see you  [place]an wrap with exercise TFCC exercises given  Lift with thumbs up only  For hamstring continue with compression when working  Decrease weight by 50% then increase by 10% weekly after that  No running or jumping for a month  See me again in 6 weeks

## 2021-09-11 NOTE — Assessment & Plan Note (Signed)
Left wrist pain shows the patient is more tender over the TFCC.  Patient noted does have good range of motion.  We discussed lifting and more of a neutral position.  Showed patient how to potentially wrap the wrist as needed.  Recommend again in 6 to 8 weeks

## 2021-09-20 DIAGNOSIS — H43393 Other vitreous opacities, bilateral: Secondary | ICD-10-CM | POA: Diagnosis not present

## 2021-09-20 DIAGNOSIS — H524 Presbyopia: Secondary | ICD-10-CM | POA: Diagnosis not present

## 2021-09-21 ENCOUNTER — Ambulatory Visit
Admission: RE | Admit: 2021-09-21 | Discharge: 2021-09-21 | Disposition: A | Discharge status: Home / Self Care | Payer: PPO | Source: Ambulatory Visit | Attending: Surgery | Admitting: Surgery

## 2021-09-21 DIAGNOSIS — K5730 Diverticulosis of large intestine without perforation or abscess without bleeding: Secondary | ICD-10-CM | POA: Diagnosis not present

## 2021-09-21 DIAGNOSIS — R1032 Left lower quadrant pain: Secondary | ICD-10-CM | POA: Diagnosis not present

## 2021-09-21 DIAGNOSIS — K760 Fatty (change of) liver, not elsewhere classified: Secondary | ICD-10-CM | POA: Diagnosis not present

## 2021-09-21 MED ORDER — IOPAMIDOL (ISOVUE-300) INJECTION 61%
100.0000 mL | Freq: Once | INTRAVENOUS | Status: AC | PRN
  Administered 2021-09-21: 100 mL via INTRAVENOUS

## 2021-09-23 ENCOUNTER — Encounter: Payer: Self-pay | Admitting: Surgery

## 2021-10-08 ENCOUNTER — Other Ambulatory Visit: Payer: Self-pay | Admitting: Internal Medicine

## 2021-10-08 DIAGNOSIS — E559 Vitamin D deficiency, unspecified: Secondary | ICD-10-CM

## 2021-10-17 ENCOUNTER — Telehealth: Payer: Self-pay | Admitting: Internal Medicine

## 2021-10-17 NOTE — Telephone Encounter (Signed)
Left message for patient to call me back at [phone] to schedule Medicare Annual Wellness Visit   Last AWV  07/14/18  Please schedule at anytime with [facility] if patient calls the office back.    40 Minutes appointment   Any questions, please call me at [phone]

## 2021-10-19 DIAGNOSIS — Z23 Encounter for immunization: Secondary | ICD-10-CM | POA: Diagnosis not present

## 2021-10-29 NOTE — Progress Notes (Signed)
[place] Phone: [phone] Subjective:   [name], am serving as a scribe for Dr. Hulan Saas.  This visit occurred during the SARS-CoV-2 public health emergency.  Safety protocols were in place, including screening questions prior to the visit, additional usage of staff PPE, and extensive cleaning of exam room while observing appropriate contact time as indicated for disinfecting solutions.   I'm seeing this patient by the request  of:  Janith Lima, MD  CC: Hamstring and left wrist pain  CNO:[id number]  09/11/2021 Left wrist pain shows the patient is more tender over the TFCC.  Patient noted does have good range of motion.  We discussed lifting and more of a neutral position.  Showed patient how to potentially wrap the wrist as needed.  Recommend again in 6 to 8 weeks  Much improved already.  We will progress as tolerated.  Still avoid any type of high impact exercises again for another 1 to 2 months.  Continue the compression.  Follow-up if any worsening pain.  Update 10/30/2021 Jonathan Young is a 70 y.o. male coming in with complaint of L wrist and R knee pain. Patient states that he overstretched R hamstring and thus had increase in pain over proximal insertion.   Wrist pain is the same as last visit. Pain throughout entire joint. Is modifying exercises in gym.  Patient states that it still gives him soreness.  States that it continues to hurt him with certain range of motion at the gym.      Past Medical History:  Diagnosis Date   Complication of anesthesia    sore throat from tube   Dysrhythmia    PVC'S   GERD (gastroesophageal reflux disease)    Hypercholesteremia    Osteoarthritis    Umbilical hernia    Past Surgical History:  Procedure Laterality Date   CARPAL TUNNEL RELEASE Left 05/23/2018   CARPAL TUNNEL RELEASE Right 11/22/2018   CERVICAL SPINE SURGERY     HEMORRHOID SURGERY   1982   INSERTION OF MESH N/A 07/27/2020   Procedure: INSERTION OF MESH;  Surgeon: Erroll Luna, MD;  Location: [facility];  Service: General;  Laterality: N/A;   SHOULDER SURGERY     BIL   TONSILLECTOMY AND ADENOIDECTOMY     TOTAL HIP ARTHROPLASTY     RT TOAL HIP   TOTAL HIP ARTHROPLASTY  06/01/2012   Procedure: TOTAL HIP ARTHROPLASTY ANTERIOR APPROACH;  Surgeon: Mauri Pole, MD;  Location: [facility];  Service: Orthopedics;  Laterality: Left;   UMBILICAL HERNIA REPAIR N/A 07/27/2020   Procedure: UMBILICAL HERNIA REPAIR WITH MESH;  Surgeon: Erroll Luna, MD;  Location: [facility];  Service: General;  Laterality: N/A;   Social History   Socioeconomic History   Marital status: Single    Spouse name: Not on file   Number of children: Not on file   Years of education: Not on file   Highest education level: Not on file  Occupational History   Not on file  Tobacco Use   Smoking status: Former    Types: Cigarettes    Quit date: 05/27/1981    Years since quitting: 40.4   Smokeless tobacco: Never  Vaping Use   Vaping Use: Never used  Substance and Sexual Activity   Alcohol use: Yes    Alcohol/week: 35.0 standard drinks    Types: 35 Shots of liquor per week  Comment: wine nightly   Drug use: No   Sexual activity: Yes  Other Topics Concern   Not on file  Social History Narrative   Not on file   Social Determinants of Health   Financial Resource Strain: Not on file  Food Insecurity: Not on file  Transportation Needs: Not on file  Physical Activity: Not on file  Stress: Not on file  Social Connections: Not on file   Allergies  Allergen Reactions   Vytorin [Ezetimibe-Simvastatin] Other (See Comments)    Muscle aches   Family History  Problem Relation Age of Onset   Emphysema Mother    Colon cancer Neg Hx    Esophageal cancer Neg Hx    Rectal cancer Neg Hx    Stomach cancer Neg Hx     Current Outpatient Medications (Endocrine &  Metabolic):    predniSONE (DELTASONE) 20 MG tablet, Take one tablet daily for the next 5 days.  Current Outpatient Medications (Cardiovascular):    rosuvastatin (CRESTOR) 10 MG tablet, TAKE 1 TABLET(10 MG) BY MOUTH DAILY   tadalafil (CIALIS) 20 MG tablet, Take 1 tablet (20 mg total) by mouth See admin instructions. Take 20 mg by mouth one hour prior to intercourse- maximum of 20 mg (1 tablet) in 24 hours  Current Outpatient Medications (Respiratory):    umeclidinium-vilanterol (ANORO ELLIPTA) 62.5-25 MCG/INH AEPB, Inhale 1 puff into the lungs daily at 6 (six) AM.  Current Outpatient Medications (Analgesics):    meloxicam (MOBIC) 15 MG tablet, TAKE 1 TABLET(15 MG) BY MOUTH DAILY   Current Outpatient Medications (Other):    pantoprazole (PROTONIX) 40 MG tablet, Take 1 tablet (40 mg total) by mouth daily.   Suvorexant (BELSOMRA) 15 MG TABS, Take 1 tablet by mouth at bedtime as needed.   valACYclovir (VALTREX) 1000 MG tablet, Take 0.5 tablets (500 mg total) by mouth daily.   vitamin C (ASCORBIC ACID) 500 MG tablet, Take 500 mg by mouth daily.   Vitamin D, Ergocalciferol, (DRISDOL) 1.25 MG (50000 UNIT) CAPS capsule, TAKE ONE CAPSULE BY MOUTH EVERY SATURDAY   ALPRAZolam (XANAX) 0.5 MG tablet, TAKE 1 TABLET(0.5 MG) BY MOUTH AT BEDTIME AS NEEDED FOR ANXIETY   b complex vitamins tablet, Take 1 tablet by mouth daily.   cyclobenzaprine (FLEXERIL) 5 MG tablet, Take 1 tablet (5 mg total) by mouth 3 (three) times daily as needed for muscle spasms. Do not drink alcohol or drive while taking this medication.  May cause drowsiness.   Reviewed prior external information including notes and imaging from  primary care provider As well as notes that were available from care everywhere and other healthcare systems.  Past medical history, social, surgical and family history all reviewed in electronic medical record.  No pertanent information unless stated regarding to the chief complaint.   Review of  Systems:  No headache, visual changes, nausea, vomiting, diarrhea, constipation, dizziness, abdominal pain, skin rash, fevers, chills, night sweats, weight loss, swollen lymph nodes, body aches, joint swelling, chest pain, shortness of breath, mood changes. POSITIVE muscle aches  Objective  Blood pressure (!) 128/100, pulse 83, height 6\' 1"  (1.854 m), SpO2 97 %.   General: No apparent distress alert and oriented x3 mood and affect normal, dressed appropriately.  HEENT: Pupils equal, extraocular movements intact  Respiratory: Patient's speak in full sentences and does not appear short of breath  Cardiovascular: No lower extremity edema, non tender, no erythema  Gait normal with good balance and coordination.  MSK: Right hamstring does have some tenderness  noted at the greater trochanteric area and does even have a mild fluctuance noted it feels like in the area.  No erythema of the skin.  Pain noted with resisted flexion of the leg.  Left wrist exam shows the patient does have tenderness over the TFCC noted.  Worsening pain with grip strength.  No worsening pain with resisted extension of the wrist.   Limited muscular skeletal ultrasound was performed and interpreted by Hulan Saas, M  Limited ultrasound of patient's hamstring shows that there is some hypoechoic changes in the more superficial to the tendon itself.  Seems to be more secondary to a bursitis truly.  Patient does have an area where the most distal veins of the tendon noted does have some increasing in neovascularization that could be consistent with a very small partial tear. Impression: Questionable small hamstring tear with questionable bursitis.  Procedure: Real-time Ultrasound Guided Injection of left wrist TFCC Device: GE Logiq Q7 Ultrasound guided injection is preferred based studies that show increased duration, increased effect, greater accuracy, decreased procedural pain, increased response rate, and decreased cost with  ultrasound guided versus blind injection.  Verbal informed consent obtained.  Time-out conducted.  Noted no overlying erythema, induration, or other signs of local infection.  Skin prepped in a sterile fashion.  Local anesthesia: Topical Ethyl chloride.  With sterile technique and under real time ultrasound guidance: With a 25-gauge half inch needle injected with 0.5 cc of 0.5% Marcaine and 0.5 cc of Kenalog 20 mg/mL Completed without difficulty  Pain immediately improved suggesting accurate placement of the medication.  Advised to call if fevers/chills, erythema, induration, drainage, or persistent bleeding.  Impression: Technically successful ultrasound guided injection.    Impression and Recommendations:     The above documentation has been reviewed and is accurate and complete Lyndal Pulley, DO

## 2021-10-30 ENCOUNTER — Ambulatory Visit: Payer: Self-pay

## 2021-10-30 ENCOUNTER — Other Ambulatory Visit: Payer: Self-pay

## 2021-10-30 ENCOUNTER — Encounter: Payer: Self-pay | Admitting: Family Medicine

## 2021-10-30 ENCOUNTER — Ambulatory Visit: Payer: PPO | Admitting: Family Medicine

## 2021-10-30 VITALS — BP 128/100 | HR 83 | Ht 73.0 in

## 2021-10-30 DIAGNOSIS — M25532 Pain in left wrist: Secondary | ICD-10-CM | POA: Diagnosis not present

## 2021-10-30 DIAGNOSIS — S76309D Unspecified injury of muscle, fascia and tendon of the posterior muscle group at thigh level, unspecified thigh, subsequent encounter: Secondary | ICD-10-CM

## 2021-10-30 DIAGNOSIS — M79602 Pain in left arm: Secondary | ICD-10-CM | POA: Diagnosis not present

## 2021-10-30 MED ORDER — PREDNISONE 20 MG PO TABS: ORAL_TABLET | ORAL | 0 refills | Status: DC

## 2021-10-30 MED ORDER — NITROGLYCERIN 0.2 MG/HR TD PT24: MEDICATED_PATCH | TRANSDERMAL | 0 refills | Status: DC

## 2021-10-30 NOTE — Assessment & Plan Note (Signed)
Patient given injection today and tolerated the procedure well, discussed icing regimen and home exercises, discussed which activities to do which wants to avoid.  Increase activity slowly.

## 2021-10-30 NOTE — Assessment & Plan Note (Signed)
Recurrent injury but seems to be more proximal.  Discussed with patient to go back to the compression.  Avoid or certain range of motion.  Really started to do some of the home exercises.  Because of the hypoechoic changes noted on ultrasound today patient will do a low dose of prednisone and then switch back to his meloxicam.  We will see how patient responds.  Follow-up again in 4 to 8 weeks.

## 2021-10-30 NOTE — Patient Instructions (Addendum)
Injection today Go back to thigh compression for hamstring Limit the ROM with some of your exercises for legs Don't try to win stretching Pred 20mg  daily 5 days, can go back to Meloxicam after finishing See you again in 6 weeks

## 2021-11-09 ENCOUNTER — Other Ambulatory Visit: Payer: Self-pay | Admitting: Internal Medicine

## 2021-11-09 DIAGNOSIS — E785 Hyperlipidemia, unspecified: Secondary | ICD-10-CM

## 2021-11-09 DIAGNOSIS — M159 Polyosteoarthritis, unspecified: Secondary | ICD-10-CM

## 2021-11-12 DIAGNOSIS — L820 Inflamed seborrheic keratosis: Secondary | ICD-10-CM | POA: Diagnosis not present

## 2021-11-12 DIAGNOSIS — Z85828 Personal history of other malignant neoplasm of skin: Secondary | ICD-10-CM | POA: Diagnosis not present

## 2021-11-12 DIAGNOSIS — L814 Other melanin hyperpigmentation: Secondary | ICD-10-CM | POA: Diagnosis not present

## 2021-11-12 DIAGNOSIS — L578 Other skin changes due to chronic exposure to nonionizing radiation: Secondary | ICD-10-CM | POA: Diagnosis not present

## 2021-11-12 DIAGNOSIS — D225 Melanocytic nevi of trunk: Secondary | ICD-10-CM | POA: Diagnosis not present

## 2021-11-12 DIAGNOSIS — Z23 Encounter for immunization: Secondary | ICD-10-CM | POA: Diagnosis not present

## 2021-11-12 DIAGNOSIS — L821 Other seborrheic keratosis: Secondary | ICD-10-CM | POA: Diagnosis not present

## 2021-11-21 DIAGNOSIS — R972 Elevated prostate specific antigen [PSA]: Secondary | ICD-10-CM | POA: Diagnosis not present

## 2021-11-28 ENCOUNTER — Other Ambulatory Visit: Payer: Self-pay | Admitting: Internal Medicine

## 2021-11-28 DIAGNOSIS — N401 Enlarged prostate with lower urinary tract symptoms: Secondary | ICD-10-CM | POA: Diagnosis not present

## 2021-11-28 DIAGNOSIS — K219 Gastro-esophageal reflux disease without esophagitis: Secondary | ICD-10-CM

## 2021-11-28 DIAGNOSIS — R972 Elevated prostate specific antigen [PSA]: Secondary | ICD-10-CM | POA: Diagnosis not present

## 2021-11-28 DIAGNOSIS — R3912 Poor urinary stream: Secondary | ICD-10-CM | POA: Diagnosis not present

## 2021-12-10 NOTE — Progress Notes (Signed)
[place] Phone: [phone] Subjective:   Jonathan Young, am serving as a scribe for Dr. Hulan Saas. This visit occurred during the SARS-CoV-2 public health emergency.  Safety protocols were in place, including screening questions prior to the visit, additional usage of staff PPE, and extensive cleaning of exam room while observing appropriate contact time as indicated for disinfecting solutions.   I'm seeing this patient by the request  of:  Janith Lima, MD  CC: leg and wrist pain follow up   AUQ:[id number]  10/30/2021 Recurrent injury but seems to be more proximal.  Discussed with patient to go back to the compression.  Avoid or certain range of motion.  Really started to do some of the home exercises.  Because of the hypoechoic changes noted on ultrasound today patient will do a low dose of prednisone and then switch back to his meloxicam.  We will see how patient responds.  Follow-up again in 4 to 8 weeks.  Patient given injection today and tolerated the procedure well, discussed icing regimen and home exercises, discussed which activities to do which wants to avoid.  Increase activity slowly.  Update 12/11/2021 Jonathan Young is a 70 y.o. male coming in with complaint of L hamstring and L wrist pain. Patient states hamstring doesn't bother him any more. Doing stretches and exercises, but not pushing it. Wrist is also feeling better. Injection helped. Patient states overall has made significant improvement he would state.  Not affecting daily activities.     Past Medical History:  Diagnosis Date   Complication of anesthesia    sore throat from tube   Dysrhythmia    PVC'S   GERD (gastroesophageal reflux disease)    Hypercholesteremia    Osteoarthritis    Umbilical hernia    Past Surgical History:  Procedure Laterality Date   CARPAL TUNNEL RELEASE Left 05/23/2018   CARPAL TUNNEL RELEASE Right  11/22/2018   CERVICAL SPINE SURGERY     HEMORRHOID SURGERY  1982   INSERTION OF MESH N/A 07/27/2020   Procedure: INSERTION OF MESH;  Surgeon: Erroll Luna, MD;  Location: [facility];  Service: General;  Laterality: N/A;   SHOULDER SURGERY     BIL   TONSILLECTOMY AND ADENOIDECTOMY     TOTAL HIP ARTHROPLASTY     RT TOAL HIP   TOTAL HIP ARTHROPLASTY  06/01/2012   Procedure: TOTAL HIP ARTHROPLASTY ANTERIOR APPROACH;  Surgeon: Mauri Pole, MD;  Location: [facility];  Service: Orthopedics;  Laterality: Left;   UMBILICAL HERNIA REPAIR N/A 07/27/2020   Procedure: UMBILICAL HERNIA REPAIR WITH MESH;  Surgeon: Erroll Luna, MD;  Location: [facility];  Service: General;  Laterality: N/A;   Social History   Socioeconomic History   Marital status: Single    Spouse name: Not on file   Number of children: Not on file   Years of education: Not on file   Highest education level: Not on file  Occupational History   Not on file  Tobacco Use   Smoking status: Former    Types: Cigarettes    Quit date: 05/27/1981    Years since quitting: 40.5   Smokeless tobacco: Never  Vaping Use   Vaping Use: Never used  Substance and Sexual Activity   Alcohol use: Yes    Alcohol/week: 35.0 standard drinks    Types: 35 Shots of liquor per week    Comment: wine nightly  Drug use: Young   Sexual activity: Yes  Other Topics Concern   Not on file  Social History Narrative   Not on file   Social Determinants of Health   Financial Resource Strain: Not on file  Food Insecurity: Not on file  Transportation Needs: Not on file  Physical Activity: Not on file  Stress: Not on file  Social Connections: Not on file   Allergies  Allergen Reactions   Vytorin [Ezetimibe-Simvastatin] Other (See Comments)    Muscle aches   Family History  Problem Relation Age of Onset   Emphysema Mother    Colon cancer Neg Hx    Esophageal cancer Neg Hx    Rectal cancer Neg Hx    Stomach cancer Neg  Hx     Current Outpatient Medications (Endocrine & Metabolic):    predniSONE (DELTASONE) 20 MG tablet, Take one tablet daily for the next 5 days.  Current Outpatient Medications (Cardiovascular):    rosuvastatin (CRESTOR) 10 MG tablet, TAKE 1 TABLET(10 MG) BY MOUTH DAILY   tadalafil (CIALIS) 20 MG tablet, Take 1 tablet (20 mg total) by mouth See admin instructions. Take 20 mg by mouth one hour prior to intercourse- maximum of 20 mg (1 tablet) in 24 hours  Current Outpatient Medications (Respiratory):    umeclidinium-vilanterol (ANORO ELLIPTA) 62.5-25 MCG/INH AEPB, Inhale 1 puff into the lungs daily at 6 (six) AM.  Current Outpatient Medications (Analgesics):    meloxicam (MOBIC) 15 MG tablet, TAKE 1 TABLET(15 MG) BY MOUTH DAILY   Current Outpatient Medications (Other):    ALPRAZolam (XANAX) 0.5 MG tablet, TAKE 1 TABLET(0.5 MG) BY MOUTH AT BEDTIME AS NEEDED FOR ANXIETY   b complex vitamins tablet, Take 1 tablet by mouth daily.   cyclobenzaprine (FLEXERIL) 5 MG tablet, Take 1 tablet (5 mg total) by mouth 3 (three) times daily as needed for muscle spasms. Do not drink alcohol or drive while taking this medication.  May cause drowsiness.   pantoprazole (PROTONIX) 40 MG tablet, TAKE 1 TABLET(40 MG) BY MOUTH DAILY   Suvorexant (BELSOMRA) 15 MG TABS, Take 1 tablet by mouth at bedtime as needed.   valACYclovir (VALTREX) 1000 MG tablet, Take 0.5 tablets (500 mg total) by mouth daily.   vitamin C (ASCORBIC ACID) 500 MG tablet, Take 500 mg by mouth daily.   Vitamin D, Ergocalciferol, (DRISDOL) 1.25 MG (50000 UNIT) CAPS capsule, TAKE ONE CAPSULE BY MOUTH EVERY SATURDAY   Reviewed prior external information including notes and imaging from  primary care provider As well as notes that were available from care everywhere and other healthcare systems.  Past medical history, social, surgical and family history all reviewed in electronic medical record.  Young pertanent information unless stated regarding  to the chief complaint.   Review of Systems:  Young headache, visual changes, nausea, vomiting, diarrhea, constipation, dizziness, abdominal pain, skin rash, fevers, chills, night sweats, weight loss, swollen lymph nodes, body aches, joint swelling, chest pain, shortness of breath, mood changes. POSITIVE muscle aches  Objective  Pulse 73, height 6\' 1"  (1.854 m), weight 211 lb (95.7 kg), SpO2 96 %.   General: Young apparent distress alert and oriented x3 mood and affect normal, dressed appropriately.  HEENT: Pupils equal, extraocular movements intact  Respiratory: Patient's speak in full sentences and does not appear short of breath  Cardiovascular: Young lower extremity edema, non tender, Young erythema  Gait normal with good balance and coordination.  MSK: Low back exam does have some mild loss of lordosis.  Still  has tightness noted of the hamstring on the right.  Mild pain with resisted flexion of the knee.  Patient on exam does have possible scar tissue formation of the hamstring noted approximately just inferior to the ischial area.  Young defect noted. Limited muscular skeletal ultrasound was performed and interpreted by Hulan Saas, M  Limited ultrasound of patient's hamstring does not have any significant tearing noted.  Possible scar tissue formation in the area of the most palpable tenderness.  Bone appears to be unremarkable.   Impression and Recommendations:     The above documentation has been reviewed and is accurate and complete Lyndal Pulley, DO

## 2021-12-11 ENCOUNTER — Ambulatory Visit: Payer: Self-pay

## 2021-12-11 ENCOUNTER — Other Ambulatory Visit: Payer: Self-pay

## 2021-12-11 ENCOUNTER — Ambulatory Visit (INDEPENDENT_AMBULATORY_CARE_PROVIDER_SITE_OTHER): Payer: PPO | Admitting: Family Medicine

## 2021-12-11 ENCOUNTER — Encounter: Payer: Self-pay | Admitting: Family Medicine

## 2021-12-11 VITALS — HR 73 | Ht 73.0 in | Wt 211.0 lb

## 2021-12-11 DIAGNOSIS — M25532 Pain in left wrist: Secondary | ICD-10-CM

## 2021-12-11 DIAGNOSIS — S76309D Unspecified injury of muscle, fascia and tendon of the posterior muscle group at thigh level, unspecified thigh, subsequent encounter: Secondary | ICD-10-CM

## 2021-12-11 NOTE — Patient Instructions (Signed)
Good to see you! Everything is looking great Baby the hamstring a little more for a couple of months See you again in 2 months

## 2021-12-11 NOTE — Assessment & Plan Note (Signed)
Patient seems to be doing much better overall.  Patient did have the findings of hypoechoic changes previously that has completely resolved at this time.  Follow-up with me as needed

## 2021-12-11 NOTE — Assessment & Plan Note (Signed)
Patient is doing really well overall.  Discussed compression.  We discussed potentially increasing activity.  On ultrasound does appear to have some scar tissue formation but should do relatively well overall.  Follow-up with me again in 6 weeks if not completely resolved.

## 2021-12-31 LAB — PSA: PSA: 6

## 2022-02-07 ENCOUNTER — Other Ambulatory Visit: Payer: Self-pay | Admitting: Internal Medicine

## 2022-02-07 DIAGNOSIS — M159 Polyosteoarthritis, unspecified: Secondary | ICD-10-CM

## 2022-02-07 NOTE — Progress Notes (Signed)
[place] Phone: [phone] Subjective:   [name], am serving as a scribe for Dr. Hulan Saas.  This visit occurred during the SARS-CoV-2 public health emergency.  Safety protocols were in place, including screening questions prior to the visit, additional usage of staff PPE, and extensive cleaning of exam room while observing appropriate contact time as indicated for disinfecting solutions.    I'm seeing this patient by the request  of:  Janith Lima, MD  CC: Wrist pain and hamstring pain follow-up IFO:[id number]  12/11/2021 Patient seems to be doing much better overall.  Patient did have the findings of hypoechoic changes previously that has completely resolved at this time.  Follow-up with me as needed  Patient is doing really well overall.  Discussed compression.  We discussed potentially increasing activity.  On ultrasound does appear to have some scar tissue formation but should do relatively well overall.  Follow-up with me again in 6 weeks if not completely resolved.  Update 02/12/2022 Jonathan Young is a 71 y.o. male coming in with complaint of L wrist and hamstring pain. Patient states that the wrist is better. He has not been doing hamstring exercises at the gym. Overall his knee does not bother him. Has some pain today after going to the gym.        Past Medical History:  Diagnosis Date   Complication of anesthesia    sore throat from tube   Dysrhythmia    PVC'S   GERD (gastroesophageal reflux disease)    Hypercholesteremia    Osteoarthritis    Umbilical hernia    Past Surgical History:  Procedure Laterality Date   CARPAL TUNNEL RELEASE Left 05/23/2018   CARPAL TUNNEL RELEASE Right 11/22/2018   CERVICAL SPINE SURGERY     HEMORRHOID SURGERY  1982   INSERTION OF MESH N/A 07/27/2020   Procedure: INSERTION OF MESH;  Surgeon: Erroll Luna, MD;  Location: [facility];   Service: General;  Laterality: N/A;   SHOULDER SURGERY     BIL   TONSILLECTOMY AND ADENOIDECTOMY     TOTAL HIP ARTHROPLASTY     RT TOAL HIP   TOTAL HIP ARTHROPLASTY  06/01/2012   Procedure: TOTAL HIP ARTHROPLASTY ANTERIOR APPROACH;  Surgeon: Mauri Pole, MD;  Location: [facility];  Service: Orthopedics;  Laterality: Left;   UMBILICAL HERNIA REPAIR N/A 07/27/2020   Procedure: UMBILICAL HERNIA REPAIR WITH MESH;  Surgeon: Erroll Luna, MD;  Location: [facility];  Service: General;  Laterality: N/A;   Social History   Socioeconomic History   Marital status: Single    Spouse name: Not on file   Number of children: Not on file   Years of education: Not on file   Highest education level: Not on file  Occupational History   Not on file  Tobacco Use   Smoking status: Former    Types: Cigarettes    Quit date: 05/27/1981    Years since quitting: 40.7   Smokeless tobacco: Never  Vaping Use   Vaping Use: Never used  Substance and Sexual Activity   Alcohol use: Yes    Alcohol/week: 35.0 standard drinks    Types: 35 Shots of liquor per week    Comment: wine nightly   Drug use: No   Sexual activity: Yes  Other Topics Concern   Not on file  Social History Narrative   Not on file   Social Determinants  of Health   Financial Resource Strain: Not on file  Food Insecurity: Not on file  Transportation Needs: Not on file  Physical Activity: Not on file  Stress: Not on file  Social Connections: Not on file   Allergies  Allergen Reactions   Vytorin [Ezetimibe-Simvastatin] Other (See Comments)    Muscle aches   Family History  Problem Relation Age of Onset   Emphysema Mother    Colon cancer Neg Hx    Esophageal cancer Neg Hx    Rectal cancer Neg Hx    Stomach cancer Neg Hx     Current Outpatient Medications (Endocrine & Metabolic):    predniSONE (DELTASONE) 20 MG tablet, Take one tablet daily for the next 5 days.  Current Outpatient Medications (Cardiovascular):     rosuvastatin (CRESTOR) 10 MG tablet, TAKE 1 TABLET(10 MG) BY MOUTH DAILY   tadalafil (CIALIS) 20 MG tablet, Take 1 tablet (20 mg total) by mouth See admin instructions. Take 20 mg by mouth one hour prior to intercourse- maximum of 20 mg (1 tablet) in 24 hours  Current Outpatient Medications (Respiratory):    umeclidinium-vilanterol (ANORO ELLIPTA) 62.5-25 MCG/INH AEPB, Inhale 1 puff into the lungs daily at 6 (six) AM.  Current Outpatient Medications (Analgesics):    meloxicam (MOBIC) 15 MG tablet, TAKE 1 TABLET(15 MG) BY MOUTH DAILY   Current Outpatient Medications (Other):    ALPRAZolam (XANAX) 0.5 MG tablet, TAKE 1 TABLET(0.5 MG) BY MOUTH AT BEDTIME AS NEEDED FOR ANXIETY   b complex vitamins tablet, Take 1 tablet by mouth daily.   cyclobenzaprine (FLEXERIL) 5 MG tablet, Take 1 tablet (5 mg total) by mouth 3 (three) times daily as needed for muscle spasms. Do not drink alcohol or drive while taking this medication.  May cause drowsiness.   pantoprazole (PROTONIX) 40 MG tablet, TAKE 1 TABLET(40 MG) BY MOUTH DAILY   Suvorexant (BELSOMRA) 15 MG TABS, Take 1 tablet by mouth at bedtime as needed.   valACYclovir (VALTREX) 1000 MG tablet, Take 0.5 tablets (500 mg total) by mouth daily.   vitamin C (ASCORBIC ACID) 500 MG tablet, Take 500 mg by mouth daily.   Vitamin D, Ergocalciferol, (DRISDOL) 1.25 MG (50000 UNIT) CAPS capsule, TAKE ONE CAPSULE BY MOUTH EVERY SATURDAY     Objective  Blood pressure 108/76, pulse 87, height 6\' 1"  (1.854 m), weight 200 lb (90.7 kg), SpO2 94 %.   General: No apparent distress alert and oriented x3 mood and affect normal, dressed appropriately.  HEENT: Pupils equal, extraocular movements intact  Respiratory: Patient's speak in full sentences and does not appear short of breath  Cardiovascular: No lower extremity edema, non tender, no erythema  Gait normal with good balance and coordination.  MSK:  Non tender with full range of motion and good stability and  symmetric strength and tone of shoulders, elbows, wrist, hip, knee and ankles bilaterally.  On exam no significant findings of either the hamstring given his difficulty.  Limited muscular skeletal ultrasound was performed and interpreted by Hulan Saas, M  Limited ultrasound of the hamstring shows very mild scar tissue formation at the origin of the hamstring but does seem to be significantly improved from previous exam at the moment.    Impression and Recommendations:     The above documentation has been reviewed and is accurate and complete Lyndal Pulley, DO

## 2022-02-12 ENCOUNTER — Ambulatory Visit (INDEPENDENT_AMBULATORY_CARE_PROVIDER_SITE_OTHER): Payer: PPO | Admitting: Family Medicine

## 2022-02-12 ENCOUNTER — Ambulatory Visit: Payer: Self-pay

## 2022-02-12 ENCOUNTER — Encounter: Payer: Self-pay | Admitting: Internal Medicine

## 2022-02-12 ENCOUNTER — Other Ambulatory Visit: Payer: Self-pay

## 2022-02-12 ENCOUNTER — Encounter: Payer: Self-pay | Admitting: Family Medicine

## 2022-02-12 ENCOUNTER — Other Ambulatory Visit: Payer: Self-pay | Admitting: Internal Medicine

## 2022-02-12 VITALS — BP 108/76 | HR 87 | Ht 73.0 in | Wt 200.0 lb

## 2022-02-12 DIAGNOSIS — S76309D Unspecified injury of muscle, fascia and tendon of the posterior muscle group at thigh level, unspecified thigh, subsequent encounter: Secondary | ICD-10-CM | POA: Diagnosis not present

## 2022-02-12 DIAGNOSIS — A09 Infectious gastroenteritis and colitis, unspecified: Secondary | ICD-10-CM

## 2022-02-12 DIAGNOSIS — M25532 Pain in left wrist: Secondary | ICD-10-CM

## 2022-02-12 MED ORDER — AZITHROMYCIN 500 MG PO TABS: 500.0000 mg | ORAL_TABLET | Freq: Every day | ORAL | 0 refills | Status: AC

## 2022-02-12 NOTE — Assessment & Plan Note (Signed)
Ultrasound today does not show any significant findings.  Follow-up again as needed

## 2022-02-12 NOTE — Assessment & Plan Note (Signed)
Back to baseline, no other significant difficulties.

## 2022-02-19 ENCOUNTER — Other Ambulatory Visit: Payer: Self-pay | Admitting: Internal Medicine

## 2022-02-19 DIAGNOSIS — J410 Simple chronic bronchitis: Secondary | ICD-10-CM

## 2022-03-02 ENCOUNTER — Other Ambulatory Visit: Payer: Self-pay | Admitting: Internal Medicine

## 2022-03-02 DIAGNOSIS — M159 Polyosteoarthritis, unspecified: Secondary | ICD-10-CM

## 2022-04-08 DIAGNOSIS — Z4789 Encounter for other orthopedic aftercare: Secondary | ICD-10-CM | POA: Diagnosis not present

## 2022-04-08 DIAGNOSIS — M720 Palmar fascial fibromatosis [Dupuytren]: Secondary | ICD-10-CM | POA: Diagnosis not present

## 2022-04-08 DIAGNOSIS — M20011 Mallet finger of right finger(s): Secondary | ICD-10-CM | POA: Diagnosis not present

## 2022-04-09 DIAGNOSIS — M25641 Stiffness of right hand, not elsewhere classified: Secondary | ICD-10-CM | POA: Diagnosis not present

## 2022-04-18 ENCOUNTER — Encounter: Payer: Self-pay | Admitting: Internal Medicine

## 2022-04-22 ENCOUNTER — Encounter: Payer: Self-pay | Admitting: Internal Medicine

## 2022-04-22 ENCOUNTER — Other Ambulatory Visit: Payer: Self-pay | Admitting: Internal Medicine

## 2022-04-22 ENCOUNTER — Other Ambulatory Visit: Payer: PPO

## 2022-04-22 DIAGNOSIS — R197 Diarrhea, unspecified: Secondary | ICD-10-CM | POA: Insufficient documentation

## 2022-04-25 LAB — GI PROFILE, STOOL, PCR

## 2022-04-26 ENCOUNTER — Ambulatory Visit (INDEPENDENT_AMBULATORY_CARE_PROVIDER_SITE_OTHER): Payer: PPO | Admitting: Internal Medicine

## 2022-04-26 ENCOUNTER — Encounter: Payer: Self-pay | Admitting: Internal Medicine

## 2022-04-26 VITALS — BP 134/82 | HR 70 | Temp 98.1°F | Ht 73.0 in | Wt 197.0 lb

## 2022-04-26 DIAGNOSIS — E781 Pure hyperglyceridemia: Secondary | ICD-10-CM

## 2022-04-26 DIAGNOSIS — A0811 Acute gastroenteropathy due to Norwalk agent: Secondary | ICD-10-CM

## 2022-04-26 DIAGNOSIS — K760 Fatty (change of) liver, not elsewhere classified: Secondary | ICD-10-CM

## 2022-04-26 DIAGNOSIS — E291 Testicular hypofunction: Secondary | ICD-10-CM | POA: Diagnosis not present

## 2022-04-26 DIAGNOSIS — I10 Essential (primary) hypertension: Secondary | ICD-10-CM

## 2022-04-26 DIAGNOSIS — F411 Generalized anxiety disorder: Secondary | ICD-10-CM

## 2022-04-26 DIAGNOSIS — E559 Vitamin D deficiency, unspecified: Secondary | ICD-10-CM

## 2022-04-26 DIAGNOSIS — Z7252 High risk homosexual behavior: Secondary | ICD-10-CM

## 2022-04-26 DIAGNOSIS — M818 Other osteoporosis without current pathological fracture: Secondary | ICD-10-CM

## 2022-04-26 DIAGNOSIS — E538 Deficiency of other specified B group vitamins: Secondary | ICD-10-CM | POA: Diagnosis not present

## 2022-04-26 DIAGNOSIS — Z0000 Encounter for general adult medical examination without abnormal findings: Secondary | ICD-10-CM | POA: Diagnosis not present

## 2022-04-26 DIAGNOSIS — N529 Male erectile dysfunction, unspecified: Secondary | ICD-10-CM

## 2022-04-26 DIAGNOSIS — E785 Hyperlipidemia, unspecified: Secondary | ICD-10-CM

## 2022-04-26 DIAGNOSIS — N183 Chronic kidney disease, stage 3 unspecified: Secondary | ICD-10-CM | POA: Diagnosis not present

## 2022-04-26 LAB — TSH: TSH: 1.3 u[IU]/mL (ref 0.35–5.50)

## 2022-04-26 LAB — HEPATIC FUNCTION PANEL
ALT: 33 U/L (ref 0–53)
AST: 36 U/L (ref 0–37)
Albumin: 4.3 g/dL (ref 3.5–5.2)
Alkaline Phosphatase: 51 U/L (ref 39–117)
Bilirubin, Direct: 0.1 mg/dL (ref 0.0–0.3)
Total Bilirubin: 0.5 mg/dL (ref 0.2–1.2)
Total Protein: 6.8 g/dL (ref 6.0–8.3)

## 2022-04-26 LAB — URINALYSIS, ROUTINE W REFLEX MICROSCOPIC
Bilirubin Urine: NEGATIVE
Hgb urine dipstick: NEGATIVE
Ketones, ur: NEGATIVE
Leukocytes,Ua: NEGATIVE
Nitrite: NEGATIVE
RBC / HPF: NONE SEEN (ref 0–?)
Specific Gravity, Urine: 1.015 (ref 1.000–1.030)
Total Protein, Urine: NEGATIVE
Urine Glucose: NEGATIVE
Urobilinogen, UA: 1 (ref 0.0–1.0)
WBC, UA: NONE SEEN (ref 0–?)
pH: 7 (ref 5.0–8.0)

## 2022-04-26 LAB — CBC WITH DIFFERENTIAL/PLATELET
Basophils Absolute: 0 10*3/uL (ref 0.0–0.1)
Basophils Relative: 0.5 % (ref 0.0–3.0)
Eosinophils Absolute: 0.2 10*3/uL (ref 0.0–0.7)
Eosinophils Relative: 2.9 % (ref 0.0–5.0)
HCT: 41.7 % (ref 39.0–52.0)
Hemoglobin: 14.2 g/dL (ref 13.0–17.0)
Lymphocytes Relative: 31.1 % (ref 12.0–46.0)
Lymphs Abs: 1.7 10*3/uL (ref 0.7–4.0)
MCHC: 34 g/dL (ref 30.0–36.0)
MCV: 95 fl (ref 78.0–100.0)
Monocytes Absolute: 0.5 10*3/uL (ref 0.1–1.0)
Monocytes Relative: 8.6 % (ref 3.0–12.0)
Neutro Abs: 3.1 10*3/uL (ref 1.4–7.7)
Neutrophils Relative %: 56.9 % (ref 43.0–77.0)
Platelets: 234 10*3/uL (ref 150.0–400.0)
RBC: 4.39 Mil/uL (ref 4.22–5.81)
RDW: 13 % (ref 11.5–15.5)
WBC: 5.5 10*3/uL (ref 4.0–10.5)

## 2022-04-26 LAB — LIPID PANEL
Cholesterol: 139 mg/dL (ref 0–200)
HDL: 43.8 mg/dL (ref >39.00)
LDL Cholesterol: 79 mg/dL (ref 0–99)
NonHDL: 95.66
Total CHOL/HDL Ratio: 3
Triglycerides: 82 mg/dL (ref 0.0–149.0)
VLDL: 16.4 mg/dL (ref 0.0–40.0)

## 2022-04-26 LAB — BASIC METABOLIC PANEL
BUN: 19 mg/dL (ref 6–23)
CO2: 27 mEq/L (ref 19–32)
Calcium: 9.2 mg/dL (ref 8.4–10.5)
Chloride: 105 mEq/L (ref 96–112)
Creatinine, Ser: 1.23 mg/dL (ref 0.40–1.50)
GFR: 59.15 mL/min — ABNORMAL LOW (ref >60.00)
Glucose, Bld: 96 mg/dL (ref 70–99)
Potassium: 4.6 mEq/L (ref 3.5–5.1)
Sodium: 142 mEq/L (ref 135–145)

## 2022-04-26 LAB — VITAMIN D 25 HYDROXY (VIT D DEFICIENCY, FRACTURES): VITD: 73.51 ng/mL (ref 30.00–100.00)

## 2022-04-26 NOTE — Progress Notes (Signed)
? ?Subjective:  ?Patient ID: Jonathan Young, male    DOB: Apr 23, 1951  Age: 71 y.o. MRN: [id number] ? ?CC: Annual Exam ? ? ?HPI ?Jonathan Young presents for a CPX and f/up - ? ?About 10 days ago he developed diarrhea, nausea, and vomiting.  He came to the office and left a stool specimen which was positive for norovirus.  His symptoms have resolved.  He complains of chronic cough but did not get any benefit with Anoro.  He is active and denies chest pain, diaphoresis, dizziness, lightheadedness, or edema.  He recently fell and broke a finger on his right hand. ? ? ?Outpatient Medications Prior to Visit  ?Medication Sig Dispense Refill  ? meloxicam (MOBIC) 15 MG tablet TAKE 1 TABLET(15 MG) BY MOUTH DAILY 90 tablet 0  ? pantoprazole (PROTONIX) 40 MG tablet TAKE 1 TABLET(40 MG) BY MOUTH DAILY 90 tablet 1  ? Suvorexant (BELSOMRA) 15 MG TABS Take 1 tablet by mouth at bedtime as needed. 90 tablet 0  ? tadalafil (CIALIS) 20 MG tablet Take 1 tablet (20 mg total) by mouth See admin instructions. Take 20 mg by mouth one hour prior to intercourse- maximum of 20 mg (1 tablet) in 24 hours 20 tablet 2  ? valACYclovir (VALTREX) 1000 MG tablet Take 0.5 tablets (500 mg total) by mouth daily. 30 tablet 0  ? vitamin C (ASCORBIC ACID) 500 MG tablet Take 500 mg by mouth daily.    ? Vitamin D, Ergocalciferol, (DRISDOL) 1.25 MG (50000 UNIT) CAPS capsule TAKE ONE CAPSULE BY MOUTH EVERY SATURDAY 90 capsule 0  ? rosuvastatin (CRESTOR) 10 MG tablet TAKE 1 TABLET(10 MG) BY MOUTH DAILY 90 tablet 1  ? ANORO ELLIPTA 62.5-25 MCG/ACT AEPB INHALE 1 PUFF INTO THE LUNGS DAILY AT 6 AM 120 each 0  ? ?No facility-administered medications prior to visit.  ? ? ?ROS ?Review of Systems  ?Constitutional:  Positive for fatigue. Negative for appetite change, chills, diaphoresis, fever and unexpected weight change.  ?HENT: Negative.  Negative for sore throat and trouble swallowing.   ?Eyes: Negative.   ?Respiratory:  Positive for cough. Negative for chest  tightness, shortness of breath and wheezing.   ?Cardiovascular:  Negative for chest pain, palpitations and leg swelling.  ?Gastrointestinal:  Negative for abdominal pain, constipation, diarrhea, nausea and vomiting.  ?Endocrine: Negative.   ?Genitourinary: Negative.  Negative for difficulty urinating and dysuria.  ?     ++ED  ?Musculoskeletal:  Positive for arthralgias. Negative for joint swelling and myalgias.  ?Skin: Negative.  Negative for color change and rash.  ?Neurological: Negative.  Negative for dizziness, weakness and light-headedness.  ?Hematological:  Negative for adenopathy. Does not bruise/bleed easily.  ?Psychiatric/Behavioral:  Negative for decreased concentration, dysphoric mood and sleep disturbance. The patient is nervous/anxious.   ? ?Objective:  ?BP 134/82 (BP Location: Left Arm, Patient Position: Sitting, Cuff Size: Large)   Pulse 70   Temp 98.1 ?F (36.7 ?C) (Oral)   Ht '6\' 1"'$  (1.854 m)   Wt 197 lb (89.4 kg)   SpO2 96%   BMI 25.99 kg/m?  ? ?BP Readings from Last 3 Encounters:  ?04/26/22 134/82  ?02/12/22 108/76  ?10/30/21 (!) 128/100  ? ? ?Wt Readings from Last 3 Encounters:  ?04/26/22 197 lb (89.4 kg)  ?02/12/22 200 lb (90.7 kg)  ?12/11/21 211 lb (95.7 kg)  ? ? ?Physical Exam ?Vitals reviewed.  ?HENT:  ?   Nose: Nose normal.  ?   Mouth/Throat:  ?   Mouth: Mucous membranes  are moist.  ?Eyes:  ?   General: No scleral icterus. ?   Conjunctiva/sclera: Conjunctivae normal.  ?Cardiovascular:  ?   Rate and Rhythm: Normal rate and regular rhythm.  ?   Heart sounds: No murmur heard. ?Pulmonary:  ?   Effort: Pulmonary effort is normal.  ?   Breath sounds: No stridor. No wheezing, rhonchi or rales.  ?Abdominal:  ?   General: Abdomen is flat.  ?   Palpations: There is no mass.  ?   Tenderness: There is no abdominal tenderness. There is no guarding.  ?   Hernia: No hernia is present.  ?Musculoskeletal:     ?   General: Normal range of motion.  ?   Cervical back: Neck supple.  ?   Right lower leg: No  edema.  ?   Left lower leg: No edema.  ?Lymphadenopathy:  ?   Cervical: No cervical adenopathy.  ?Skin: ?   General: Skin is warm and dry.  ?   Coloration: Skin is not pale.  ?   Findings: No rash.  ?Neurological:  ?   General: No focal deficit present.  ?   Mental Status: He is alert. Mental status is at baseline.  ?Psychiatric:     ?   Mood and Affect: Mood normal.     ?   Behavior: Behavior normal.     ?   Thought Content: Thought content normal.     ?   Judgment: Judgment normal.  ? ? ?Lab Results  ?Component Value Date  ? WBC 5.5 04/26/2022  ? HGB 14.2 04/26/2022  ? HCT 41.7 04/26/2022  ? PLT 234.0 04/26/2022  ? GLUCOSE 96 04/26/2022  ? CHOL 139 04/26/2022  ? TRIG 82.0 04/26/2022  ? HDL 43.80 04/26/2022  ? LDLDIRECT 87.0 04/03/2016  ? [place] 79 04/26/2022  ? ALT 33 04/26/2022  ? AST 36 04/26/2022  ? NA 142 04/26/2022  ? K 4.6 04/26/2022  ? CL 105 04/26/2022  ? CREATININE 1.23 04/26/2022  ? BUN 19 04/26/2022  ? CO2 27 04/26/2022  ? TSH 1.30 04/26/2022  ? PSA 6.0 12/31/2021  ? INR 1.0 04/18/2021  ? HGBA1C 5.8 04/03/2016  ? ? ?CT ABDOMEN PELVIS W CONTRAST ? ?Result Date: 09/23/2021 ?CLINICAL DATA:  Left lower quadrant abdominal pain. History of umbilical hernia repair. EXAM: CT ABDOMEN AND PELVIS WITH CONTRAST TECHNIQUE: Multidetector CT imaging of the abdomen and pelvis was performed using the standard protocol following bolus administration of intravenous contrast. CONTRAST:  157m ISOVUE-300 IOPAMIDOL (ISOVUE-300) INJECTION 61% Creatinine was obtained [place] [facility] [address] Results: Creatinine 1.0 mg/dL. COMPARISON:  CT 04/02/2019, abdominal MRI 06/24/2019 FINDINGS: Lower chest: Right lower lobe pulmonary nodule which was recently followed with chest CT 08/07/2021, considered benign. Calcified granuloma in the right lower lobe. No acute basilar findings. Hepatobiliary: Mild subjective hepatic steatosis. The subcentimeter lesion on prior MRI in the right lobe is not definitively  seen on the current exam. No suspicious liver lesion. Gallbladder physiologically distended, no calcified stone. No biliary dilatation. Pancreas: Unremarkable. No pancreatic ductal dilatation or surrounding inflammatory changes. Spleen: Normal in size without focal abnormality. Adrenals/Urinary Tract: No adrenal nodule. Again seen cortical scarring in the lower left kidney. No hydronephrosis, stone, or focal renal lesion. Urinary bladder is partially obscured by streak artifact from bilateral hip arthroplasties, only partially distended. Stomach/Bowel: Portions of the sigmoid colon in the deep pelvis are obscured by streak artifact from bilateral hip arthroplasty. Small hiatal hernia.  No gastric wall thickening. There is no small bowel obstruction or inflammation. No small bowel wall thickening. Normal appendix. Enteric contrast has reached the colon. Moderate stool in the right colon, with small volume of stool distally. Mild left colonic diverticulosis without diverticulitis. No colonic wall thickening or evidence of colonic mass. Vascular/Lymphatic: Minimal aortic atherosclerosis. No aortic aneurysm. Patent portal vein. Small retroperitoneal lymph nodes are not enlarged by size criteria and likely reactive. There are no enlarged lymph nodes in the abdomen or pelvis. Reproductive: Prostate is obscured by streak artifact from hip arthroplasty. Other: Suspected small fat left inguinal hernia, with detailed assessment obscured by streak artifact. Umbilical hernia repair without recurrent hernia. No ascites. No focal fluid collection. Musculoskeletal: Degenerative disc disease at L4-L5 and L5-S1. Multilevel facet hypertrophy. Bilateral hip arthroplasties. There are no acute or suspicious osseous abnormalities. IMPRESSION: 1. Suspected small fat left inguinal hernia, with detailed assessment obscured by streak artifact from bilateral hip arthroplasties. 2. Umbilical hernia repair.  No recurrent hernia. 3. Mild left  colonic diverticulosis without diverticulitis. 4. Mild hepatic steatosis. Aortic Atherosclerosis (ICD10-I70.0). Electronically Signed   By: Keith Rake M.D.   On: 09/23/2021 10:37  ? ? ?Assessment & Plan:  ? ?Corine Shelter w

## 2022-04-26 NOTE — Patient Instructions (Signed)
Health Maintenance, Male Adopting a healthy lifestyle and getting preventive care are important in promoting health and wellness. Ask your health care provider about: The right schedule for you to have regular tests and exams. Things you can do on your own to prevent diseases and keep yourself healthy. What should I know about diet, weight, and exercise? Eat a healthy diet  Eat a diet that includes plenty of vegetables, fruits, low-fat dairy products, and lean protein. Do not eat a lot of foods that are high in solid fats, added sugars, or sodium. Maintain a healthy weight Body mass index (BMI) is a measurement that can be used to identify possible weight problems. It estimates body fat based on height and weight. Your health care provider can help determine your BMI and help you achieve or maintain a healthy weight. Get regular exercise Get regular exercise. This is one of the most important things you can do for your health. Most adults should: Exercise for at least 150 minutes each week. The exercise should increase your heart rate and make you sweat (moderate-intensity exercise). Do strengthening exercises at least twice a week. This is in addition to the moderate-intensity exercise. Spend less time sitting. Even light physical activity can be beneficial. Watch cholesterol and blood lipids Have your blood tested for lipids and cholesterol at 71 years of age, then have this test every 5 years. You may need to have your cholesterol levels checked more often if: Your lipid or cholesterol levels are high. You are older than 71 years of age. You are at high risk for heart disease. What should I know about cancer screening? Many types of cancers can be detected early and may often be prevented. Depending on your health history and family history, you may need to have cancer screening at various ages. This may include screening for: Colorectal cancer. Prostate cancer. Skin cancer. Lung  cancer. What should I know about heart disease, diabetes, and high blood pressure? Blood pressure and heart disease High blood pressure causes heart disease and increases the risk of stroke. This is more likely to develop in people who have high blood pressure readings or are overweight. Talk with your health care provider about your target blood pressure readings. Have your blood pressure checked: Every 3-5 years if you are 18-39 years of age. Every year if you are 40 years old or older. If you are between the ages of 65 and 75 and are a current or former smoker, ask your health care provider if you should have a one-time screening for abdominal aortic aneurysm (AAA). Diabetes Have regular diabetes screenings. This checks your fasting blood sugar level. Have the screening done: Once every three years after age 45 if you are at a normal weight and have a low risk for diabetes. More often and at a younger age if you are overweight or have a high risk for diabetes. What should I know about preventing infection? Hepatitis B If you have a higher risk for hepatitis B, you should be screened for this virus. Talk with your health care provider to find out if you are at risk for hepatitis B infection. Hepatitis C Blood testing is recommended for: Everyone born from 1945 through 1965. Anyone with known risk factors for hepatitis C. Sexually transmitted infections (STIs) You should be screened each year for STIs, including gonorrhea and chlamydia, if: You are sexually active and are younger than 71 years of age. You are older than 71 years of age and your   health care provider tells you that you are at risk for this type of infection. Your sexual activity has changed since you were last screened, and you are at increased risk for chlamydia or gonorrhea. Ask your health care provider if you are at risk. Ask your health care provider about whether you are at high risk for HIV. Your health care provider  may recommend a prescription medicine to help prevent HIV infection. If you choose to take medicine to prevent HIV, you should first get tested for HIV. You should then be tested every 3 months for as long as you are taking the medicine. Follow these instructions at home: Alcohol use Do not drink alcohol if your health care provider tells you not to drink. If you drink alcohol: Limit how much you have to 0-2 drinks a day. Know how much alcohol is in your drink. In the [place] one drink equals one 12 oz bottle of beer (355 mL), one 5 oz glass of wine (148 mL), or one 1 oz glass of hard liquor (44 mL). Lifestyle Do not use any products that contain nicotine or tobacco. These products include cigarettes, chewing tobacco, and vaping devices, such as e-cigarettes. If you need help quitting, ask your health care provider. Do not use street drugs. Do not share needles. Ask your health care provider for help if you need support or information about quitting drugs. General instructions Schedule regular health, dental, and eye exams. Stay current with your vaccines. Tell your health care provider if: You often feel depressed. You have ever been abused or do not feel safe at home. Summary Adopting a healthy lifestyle and getting preventive care are important in promoting health and wellness. Follow your health care provider's instructions about healthy diet, exercising, and getting tested or screened for diseases. Follow your health care provider's instructions on monitoring your cholesterol and blood pressure. This information is not intended to replace advice given to you by your health care provider. Make sure you discuss any questions you have with your health care provider. Document Revised: 04/30/2021 Document Reviewed: 04/30/2021 Elsevier Patient Education  2023 [employer]  

## 2022-04-28 LAB — TESTOSTERONE TOTAL,FREE,BIO, MALES
Albumin: 4.3 g/dL (ref 3.6–5.1)
Sex Hormone Binding: 47 nmol/L (ref 22–77)
Testosterone, Bioavailable: 102.5 ng/dL (ref 15.0–150.0)
Testosterone, Free: 52.1 pg/mL (ref 6.0–73.0)
Testosterone: 517 ng/dL (ref 250–827)

## 2022-04-28 LAB — HIV ANTIBODY (ROUTINE TESTING W REFLEX): HIV 1&2 Ab, 4th Generation: NONREACTIVE

## 2022-04-28 LAB — HEPATITIS A ANTIBODY, TOTAL: Hepatitis A AB,Total: REACTIVE — AB

## 2022-04-28 LAB — HEPATITIS B SURFACE ANTIBODY, QUANTITATIVE: Hep B S AB Quant (Post): >1000 m[IU]/mL (ref >=10)

## 2022-04-28 LAB — RPR: RPR Ser Ql: NONREACTIVE

## 2022-04-29 ENCOUNTER — Encounter: Payer: Self-pay | Admitting: Internal Medicine

## 2022-04-29 MED ORDER — TADALAFIL 20 MG PO TABS: 20.0000 mg | ORAL_TABLET | ORAL | 2 refills | Status: AC

## 2022-04-29 MED ORDER — ROSUVASTATIN CALCIUM 10 MG PO TABS
10.0000 mg | ORAL_TABLET | Freq: Every day | ORAL | 1 refills | Status: DC
Start: 2022-04-29 — End: 2022-05-08

## 2022-05-08 ENCOUNTER — Other Ambulatory Visit: Payer: Self-pay | Admitting: Internal Medicine

## 2022-05-08 DIAGNOSIS — M159 Polyosteoarthritis, unspecified: Secondary | ICD-10-CM

## 2022-05-08 DIAGNOSIS — E785 Hyperlipidemia, unspecified: Secondary | ICD-10-CM

## 2022-05-22 DIAGNOSIS — M20011 Mallet finger of right finger(s): Secondary | ICD-10-CM | POA: Diagnosis not present

## 2022-05-22 DIAGNOSIS — M720 Palmar fascial fibromatosis [Dupuytren]: Secondary | ICD-10-CM | POA: Diagnosis not present

## 2022-05-22 DIAGNOSIS — Z4789 Encounter for other orthopedic aftercare: Secondary | ICD-10-CM | POA: Diagnosis not present

## 2022-05-30 ENCOUNTER — Other Ambulatory Visit: Payer: Self-pay | Admitting: Internal Medicine

## 2022-05-30 DIAGNOSIS — K219 Gastro-esophageal reflux disease without esophagitis: Secondary | ICD-10-CM

## 2022-06-13 ENCOUNTER — Ambulatory Visit
Admission: RE | Admit: 2022-06-13 | Discharge: 2022-06-13 | Disposition: A | Discharge status: Home / Self Care | Payer: PPO | Source: Ambulatory Visit | Attending: Internal Medicine | Admitting: Internal Medicine

## 2022-06-13 DIAGNOSIS — M818 Other osteoporosis without current pathological fracture: Secondary | ICD-10-CM

## 2022-07-02 ENCOUNTER — Ambulatory Visit
Admission: RE | Admit: 2022-07-02 | Discharge: 2022-07-02 | Disposition: A | Discharge status: Home / Self Care | Payer: PPO | Source: Ambulatory Visit | Attending: Internal Medicine | Admitting: Internal Medicine

## 2022-07-02 DIAGNOSIS — M85832 Other specified disorders of bone density and structure, left forearm: Secondary | ICD-10-CM | POA: Diagnosis not present

## 2022-07-03 ENCOUNTER — Encounter: Payer: Self-pay | Admitting: Internal Medicine

## 2022-07-08 ENCOUNTER — Encounter (INDEPENDENT_AMBULATORY_CARE_PROVIDER_SITE_OTHER): Payer: PPO | Admitting: Internal Medicine

## 2022-07-08 DIAGNOSIS — M8589 Other specified disorders of bone density and structure, multiple sites: Secondary | ICD-10-CM

## 2022-07-09 ENCOUNTER — Other Ambulatory Visit: Payer: Self-pay | Admitting: Internal Medicine

## 2022-07-09 ENCOUNTER — Ambulatory Visit (HOSPITAL_COMMUNITY): Payer: PPO

## 2022-07-09 NOTE — Telephone Encounter (Signed)
Please see the MyChart message reply(ies) for my assessment and plan.  The patient gave consent for this Medical Advice Message and is aware that it may result in a bill to their insurance company as well as the possibility that this may result in a co-payment or deductible. They are an established patient, but are not seeking medical advice exclusively about a problem treated during an in person or video visit in the last 7 days. I did not recommend an in person or video visit within 7 days of my reply.  I spent a total of 10 minutes cumulative time within 7 days through MyChart messaging Aymen Widrig, MD  

## 2022-07-10 ENCOUNTER — Encounter (HOSPITAL_COMMUNITY): Payer: Self-pay

## 2022-07-10 ENCOUNTER — Ambulatory Visit (HOSPITAL_COMMUNITY)
Admission: RE | Admit: 2022-07-10 | Discharge: 2022-07-10 | Disposition: A | Discharge status: Home / Self Care | Payer: PPO | Source: Ambulatory Visit | Attending: Physician Assistant | Admitting: Physician Assistant

## 2022-07-10 VITALS — BP 135/76 | HR 74 | Temp 97.7°F | Resp 16 | Ht 73.0 in | Wt 200.0 lb

## 2022-07-10 DIAGNOSIS — M545 Low back pain, unspecified: Secondary | ICD-10-CM | POA: Diagnosis not present

## 2022-07-10 DIAGNOSIS — Z8719 Personal history of other diseases of the digestive system: Secondary | ICD-10-CM | POA: Insufficient documentation

## 2022-07-10 LAB — CBC WITH DIFFERENTIAL/PLATELET
Abs Immature Granulocytes: 0.02 10*3/uL (ref 0.00–0.07)
Basophils Absolute: 0 10*3/uL (ref 0.0–0.1)
Basophils Relative: 1 %
Eosinophils Absolute: 0.2 10*3/uL (ref 0.0–0.5)
Eosinophils Relative: 4 %
HCT: 44.6 % (ref 39.0–52.0)
Hemoglobin: 15.2 g/dL (ref 13.0–17.0)
Immature Granulocytes: 0 %
Lymphocytes Relative: 35 %
Lymphs Abs: 2 10*3/uL (ref 0.7–4.0)
MCH: 31.7 pg (ref 26.0–34.0)
MCHC: 34.1 g/dL (ref 30.0–36.0)
MCV: 92.9 fL (ref 80.0–100.0)
Monocytes Absolute: 0.6 10*3/uL (ref 0.1–1.0)
Monocytes Relative: 10 %
Neutro Abs: 2.9 10*3/uL (ref 1.7–7.7)
Neutrophils Relative %: 50 %
Platelets: 209 10*3/uL (ref 150–400)
RBC: 4.8 MIL/uL (ref 4.22–5.81)
RDW: 12.2 % (ref 11.5–15.5)
WBC: 5.7 10*3/uL (ref 4.0–10.5)
nRBC: 0 % (ref 0.0–0.2)

## 2022-07-10 LAB — POCT URINALYSIS DIPSTICK, ED / UC
Bilirubin Urine: NEGATIVE
Glucose, UA: 100 mg/dL — AB
Hgb urine dipstick: NEGATIVE
Ketones, ur: NEGATIVE mg/dL
Leukocytes,Ua: NEGATIVE
Nitrite: NEGATIVE
Protein, ur: NEGATIVE mg/dL
Specific Gravity, Urine: 1.01 (ref 1.005–1.030)
Urobilinogen, UA: 0.2 mg/dL (ref 0.0–1.0)
pH: 6 (ref 5.0–8.0)

## 2022-07-10 LAB — COMPREHENSIVE METABOLIC PANEL
ALT: 31 U/L (ref 0–44)
AST: 33 U/L (ref 15–41)
Albumin: 4.2 g/dL (ref 3.5–5.0)
Alkaline Phosphatase: 56 U/L (ref 38–126)
Anion gap: 10 (ref 5–15)
BUN: 18 mg/dL (ref 8–23)
CO2: 26 mmol/L (ref 22–32)
Calcium: 9.7 mg/dL (ref 8.9–10.3)
Chloride: 103 mmol/L (ref 98–111)
Creatinine, Ser: 1.21 mg/dL (ref 0.61–1.24)
GFR, Estimated: >60 mL/min (ref >60)
Glucose, Bld: 87 mg/dL (ref 70–99)
Potassium: 4.6 mmol/L (ref 3.5–5.1)
Sodium: 139 mmol/L (ref 135–145)
Total Bilirubin: 0.9 mg/dL (ref 0.3–1.2)
Total Protein: 6.9 g/dL (ref 6.5–8.1)

## 2022-07-10 LAB — LIPASE, BLOOD: Lipase: 40 U/L (ref 11–51)

## 2022-07-10 NOTE — ED Provider Notes (Signed)
[place]    CSN: [id number] Arrival date & time: 07/10/22  0820      History   Chief Complaint Chief Complaint  Patient presents with   Back Pain    Entered by patient    HPI Jonathan Young is a 71 y.o. male.   Patient presents today with a several day history of intermittent back pain.  Reports that a few days ago he developed a headache which is unusual for him.  He then had pain that traveled throughout his spine from his neck into his lower spine.  It was significant but then improved on its own.  He did have a recurrence yesterday for a few hours but has again improved.  He has not been taking any medication for this.  He denies any recent illness or additional symptoms including cough, congestion, fever, nausea, vomiting.  Does report he has had some increased bowel movements but denies diarrhea as well as some abdominal upset.  He attributed this to starting a magnesium supplement a few months ago.  He reports that pain has improved and is currently rated 1 on a 0-10 pain scale, described as a mild ache, localized to thoracic/lumbar back, no aggravating or alleviating factors identified.  He denies any urinary symptoms or history of nephrolithiasis.  He does have a history of pancreatitis reports symptoms are similar to previous episodes of this condition.  Denies any alcohol consumption or change medications.  He does not take GLP-1 agonist.  Reports that he has been drinking normally and doing well.    Past Medical History:  Diagnosis Date   Complication of anesthesia    sore throat from tube   Dysrhythmia    PVC'S   GERD (gastroesophageal reflux disease)    Hypercholesteremia    Osteoarthritis    Umbilical hernia     Patient Active Problem List   Diagnosis Date Noted   Acute gastroenteropathy due to Norovirus 04/26/2022   Psychophysiological insomnia 06/06/2020   Fatty infiltration of liver 03/02/2020   Nodule of lower lobe of right lung 02/14/2020    Maxillary sinusitis, chronic 06/29/2017   CRI (chronic renal insufficiency), stage 3 (moderate) ([place]) 04/07/2017   Pure hyperglyceridemia 04/03/2017   Essential hypertension 11/07/2016   GAD (generalized anxiety disorder) 10/17/2015   Erectile dysfunction of organic origin 10/10/2015   PSA elevation 01/10/2015   High risk homosexual behavior 12/29/2014   Routine general medical examination at a health care facility 12/29/2014   GERD (gastroesophageal reflux disease) 04/23/2011   IBS (irritable bowel syndrome) 04/23/2011   Vitamin D deficiency 01/16/2011   DUPUYTREN'S CONTRACTURE 01/16/2011   Hypogonadism male 07/25/2010   IDIOPATHIC OSTEOPOROSIS 11/01/2008   Vitamin B 12 deficiency 03/22/2008   Osteoarthritis 08/25/2007   Hyperlipidemia with target LDL less than 130 08/03/2007    Past Surgical History:  Procedure Laterality Date   CARPAL TUNNEL RELEASE Left 05/23/2018   CARPAL TUNNEL RELEASE Right 11/22/2018   CERVICAL SPINE SURGERY     HEMORRHOID SURGERY  1982   INSERTION OF MESH N/A 07/27/2020   Procedure: INSERTION OF MESH;  Surgeon: Erroll Luna, MD;  Location: [facility];  Service: General;  Laterality: N/A;   SHOULDER SURGERY     BIL   TONSILLECTOMY AND ADENOIDECTOMY     TOTAL HIP ARTHROPLASTY     RT TOAL HIP   TOTAL HIP ARTHROPLASTY  06/01/2012   Procedure: TOTAL HIP ARTHROPLASTY ANTERIOR APPROACH;  Surgeon: Mauri Pole, MD;  Location: [facility];  Service: Orthopedics;  Laterality: Left;   UMBILICAL HERNIA REPAIR N/A 07/27/2020   Procedure: UMBILICAL HERNIA REPAIR WITH MESH;  Surgeon: Erroll Luna, MD;  Location: [facility];  Service: General;  Laterality: N/A;       Home Medications    Prior to Admission medications   Medication Sig Start Date End Date Taking? Authorizing Provider  meloxicam (MOBIC) 15 MG tablet TAKE 1 TABLET(15 MG) BY MOUTH DAILY 05/08/22  Yes Janith Lima, MD  pantoprazole (PROTONIX) 40 MG tablet TAKE 1  TABLET(40 MG) BY MOUTH DAILY 05/30/22  Yes Janith Lima, MD  rosuvastatin (CRESTOR) 10 MG tablet TAKE 1 TABLET(10 MG) BY MOUTH DAILY 05/08/22  Yes Janith Lima, MD  Suvorexant (BELSOMRA) 15 MG TABS Take 1 tablet by mouth at bedtime as needed. 06/06/20  Yes Janith Lima, MD  tadalafil (CIALIS) 20 MG tablet Take 1 tablet (20 mg total) by mouth See admin instructions. Take 20 mg by mouth one hour prior to intercourse- maximum of 20 mg (1 tablet) in 24 hours 04/29/22  Yes Janith Lima, MD  valACYclovir (VALTREX) 1000 MG tablet Take 0.5 tablets (500 mg total) by mouth daily. 08/26/18  Yes Janith Lima, MD  Vitamin D, Ergocalciferol, (DRISDOL) 1.25 MG (50000 UNIT) CAPS capsule TAKE ONE CAPSULE BY MOUTH EVERY SATURDAY 10/08/21  Yes Janith Lima, MD    Family History Family History  Problem Relation Age of Onset   Emphysema Mother    Colon cancer Neg Hx    Esophageal cancer Neg Hx    Rectal cancer Neg Hx    Stomach cancer Neg Hx     Social History Social History   Tobacco Use   Smoking status: Former    Types: Cigarettes    Quit date: 05/27/1981    Years since quitting: 41.1   Smokeless tobacco: Never  Vaping Use   Vaping Use: Never used  Substance Use Topics   Alcohol use: Yes    Alcohol/week: 20.0 standard drinks of alcohol    Types: 20 Shots of liquor per week    Comment: wine nightly   Drug use: No     Allergies   Vytorin [ezetimibe-simvastatin]   Review of Systems Review of Systems  Constitutional:  Positive for activity change. Negative for appetite change, fatigue and fever.  Respiratory:  Negative for cough and shortness of breath.   Cardiovascular:  Negative for chest pain.  Gastrointestinal:  Negative for abdominal pain, diarrhea, nausea and vomiting.  Musculoskeletal:  Negative for arthralgias, back pain (Improved) and myalgias.  Neurological:  Negative for dizziness, speech difficulty, weakness, light-headedness and headaches (Resolved).     Physical  Exam Triage Vital Signs ED Triage Vitals  Enc Vitals Group     BP 07/10/22 0842 135/76     Pulse Rate 07/10/22 0842 74     Resp 07/10/22 0842 16     Temp 07/10/22 0842 97.7 F (36.5 C)     Temp Source 07/10/22 0842 Oral     SpO2 07/10/22 0842 97 %     Weight 07/10/22 0844 200 lb (90.7 kg)     Height 07/10/22 0844 '6\' 1"'$  (1.854 m)     Head Circumference --      Peak Flow --      Pain Score 07/10/22 0844 1     Pain Loc --      Pain Edu? --      Excl. in [place]? --  No data found.  Updated Vital Signs BP 135/76 (BP Location: Right Arm)   Pulse 74   Temp 97.7 F (36.5 C) (Oral)   Resp 16   Ht '6\' 1"'$  (1.854 m)   Wt 200 lb (90.7 kg)   SpO2 97%   BMI 26.39 kg/m   Visual Acuity Right Eye Distance:   Left Eye Distance:   Bilateral Distance:    Right Eye Near:   Left Eye Near:    Bilateral Near:     Physical Exam Vitals reviewed.  Constitutional:      General: He is awake.     Appearance: Normal appearance. He is well-developed. He is not ill-appearing.     Comments: Very pleasant male appears stated age in no acute distress sitting comfortably in exam room  HENT:     Head: Normocephalic and atraumatic.     Right Ear: Tympanic membrane, ear canal and external ear normal. Tympanic membrane is not erythematous or bulging.     Left Ear: Tympanic membrane, ear canal and external ear normal. Tympanic membrane is not erythematous or bulging.     Nose: Nose normal.     Mouth/Throat:     Pharynx: Uvula midline. No oropharyngeal exudate, posterior oropharyngeal erythema or uvula swelling.  Cardiovascular:     Rate and Rhythm: Normal rate and regular rhythm.     Heart sounds: Normal heart sounds, S1 normal and S2 normal. No murmur heard. Pulmonary:     Effort: Pulmonary effort is normal. No accessory muscle usage or respiratory distress.     Breath sounds: Normal breath sounds. No stridor. No wheezing, rhonchi or rales.     Comments: Clear to auscultation bilaterally Abdominal:      General: Bowel sounds are normal.     Palpations: Abdomen is soft.     Tenderness: There is no abdominal tenderness.     Comments: Benign abdominal exam  Musculoskeletal:     Cervical back: No tenderness or bony tenderness.     Thoracic back: No tenderness or bony tenderness.     Lumbar back: No tenderness or bony tenderness.  Lymphadenopathy:     Head:     Right side of head: No submental, submandibular or tonsillar adenopathy.     Left side of head: No submental, submandibular or tonsillar adenopathy.     Cervical: No cervical adenopathy.  Neurological:     Mental Status: He is alert.  Psychiatric:        Behavior: Behavior is cooperative.      UC Treatments / Results  Labs (all labs ordered are listed, but only abnormal results are displayed) Labs Reviewed  POCT URINALYSIS DIPSTICK, ED / UC - Abnormal; Notable for the following components:      Result Value   Glucose, UA 100 (*)    All other components within normal limits  CBC WITH DIFFERENTIAL/PLATELET  COMPREHENSIVE METABOLIC PANEL  LIPASE, BLOOD    EKG   Radiology No results found.  Procedures Procedures (including critical care time)  Medications Ordered in UC Medications - No data to display  Initial Impression / Assessment and Plan / UC Course  I have reviewed the triage vital signs and the nursing notes.  Pertinent labs & imaging results that were available during my care of the patient were reviewed by me and considered in my medical decision making (see chart for details).     Patient is well-appearing with minimal symptoms in clinic today.  Unclear etiology of symptoms.  UA  did show trace glucosuria.  Patient was unable to wait for additional work-up but CMP was obtained.  If significant hyperglycemia noted we will contact him to arrange additional treatment.  CBC, CMP, lipase obtained to investigate additional causes of symptoms.  He was encouraged to push fluids and use over-the-counter  medications for symptom relief.  Discussed that if he has any recurrence of symptoms he should return for reevaluation.  Strict return precautions given.    Final Clinical Impressions(s) / UC Diagnoses   Final diagnoses:  Acute bilateral low back pain without sciatica  History of pancreatitis   Discharge Instructions   None    ED Prescriptions   None    PDMP not reviewed this encounter.   Terrilee Croak, PA-C 07/10/22 1029

## 2022-07-10 NOTE — ED Triage Notes (Signed)
2 days ago Patient was having back pain. Yesterday he woke up with pain from the head down to the lower back, a tight pain.  Today Patient having neck pain, low back pain, and congestion.   Patient states 2-3 years ago Patient had pancreatitis and states this pain feels similar.   Patient got a massage yesterday and did afrin nasal spray, this helped with the back pain and the headache.

## 2022-08-09 ENCOUNTER — Other Ambulatory Visit: Payer: Self-pay | Admitting: Internal Medicine

## 2022-08-09 DIAGNOSIS — F5104 Psychophysiologic insomnia: Secondary | ICD-10-CM

## 2022-08-09 MED ORDER — BELSOMRA 15 MG PO TABS: 1.0000 | ORAL_TABLET | Freq: Every evening | ORAL | 0 refills | Status: DC | PRN

## 2022-08-16 NOTE — Progress Notes (Unsigned)
[facility] [place] Phone: [phone] Subjective:   [name], am serving as a scribe for Dr. Hulan Saas. I'm seeing this patient by the request  of:  Janith Lima, MD  CC: knee pain   [id number]  Seen Feb 2023 for wrist and hamstring pain  Kaiyu P Capano is a 71 y.o. male coming in with complaint of B knee pain, R>L. Patient has developed pain in both knees behind the patella. Patient has pain with stair climbing.  Patient states that it is uncomfortable.  Not stopping him from activity yet though at this moment.  Wrist pain continues but has been manageable.   Since we have seen patient patient has seen multiple other providers including his primary care for GI upset, as well as hand surgeon to discuss Dupuytren contracture and mallet finger.  Patient was in the emergency room July 10, 2022 for acute bilateral low back pain.     Past Medical History:  Diagnosis Date   Complication of anesthesia    sore throat from tube   Dysrhythmia    PVC'S   GERD (gastroesophageal reflux disease)    Hypercholesteremia    Osteoarthritis    Umbilical hernia    Past Surgical History:  Procedure Laterality Date   CARPAL TUNNEL RELEASE Left 05/23/2018   CARPAL TUNNEL RELEASE Right 11/22/2018   CERVICAL SPINE SURGERY     HEMORRHOID SURGERY  1982   INSERTION OF MESH N/A 07/27/2020   Procedure: INSERTION OF MESH;  Surgeon: Erroll Luna, MD;  Location: [facility];  Service: General;  Laterality: N/A;   SHOULDER SURGERY     BIL   TONSILLECTOMY AND ADENOIDECTOMY     TOTAL HIP ARTHROPLASTY     RT TOAL HIP   TOTAL HIP ARTHROPLASTY  06/01/2012   Procedure: TOTAL HIP ARTHROPLASTY ANTERIOR APPROACH;  Surgeon: Mauri Pole, MD;  Location: [facility];  Service: Orthopedics;  Laterality: Left;   UMBILICAL HERNIA REPAIR N/A 07/27/2020   Procedure: UMBILICAL HERNIA REPAIR WITH MESH;  Surgeon: Erroll Luna, MD;   Location: [facility];  Service: General;  Laterality: N/A;   Social History   Socioeconomic History   Marital status: Single    Spouse name: Not on file   Number of children: Not on file   Years of education: Not on file   Highest education level: Not on file  Occupational History   Not on file  Tobacco Use   Smoking status: Former    Types: Cigarettes    Quit date: 05/27/1981    Years since quitting: 41.2   Smokeless tobacco: Never  Vaping Use   Vaping Use: Never used  Substance and Sexual Activity   Alcohol use: Yes    Alcohol/week: 20.0 standard drinks of alcohol    Types: 20 Shots of liquor per week    Comment: wine nightly   Drug use: No   Sexual activity: Yes    Partners: Male  Other Topics Concern   Not on file  Social History Narrative   Not on file   Social Determinants of Health   Financial Resource Strain: Low Risk  (08/26/2018)   Overall Financial Resource Strain (CARDIA)    Difficulty of Paying Living Expenses: Not hard at all  Food Insecurity: No Food Insecurity (08/26/2018)   Hunger Vital Sign    Worried About Running Out of Food in the Last Year: Never true  Ran Out of Food in the Last Year: Never true  Transportation Needs: No Transportation Needs (08/26/2018)   PRAPARE - Hydrologist (Medical): No    Lack of Transportation (Non-Medical): No  Physical Activity: Sufficiently Active (08/26/2018)   Exercise Vital Sign    Days of Exercise per Week: 5 days    Minutes of Exercise per Session: 60 min  Stress: No Stress Concern Present (08/26/2018)   [place]    Feeling of Stress : Only a little  Social Connections: Unknown (08/26/2018)   Social Connection and Isolation Panel [NHANES]    Frequency of Communication with Friends and Family: More than three times a week    Frequency of Social Gatherings with Friends and Family: More than three times a week     Attends Religious Services: Not on Advertising copywriter or Organizations: Not on file    Attends Archivist Meetings: Not on file    Marital Status: Not on file   Allergies  Allergen Reactions   Vytorin [Ezetimibe-Simvastatin] Other (See Comments)    Muscle aches   Family History  Problem Relation Age of Onset   Emphysema Mother    Colon cancer Neg Hx    Esophageal cancer Neg Hx    Rectal cancer Neg Hx    Stomach cancer Neg Hx      Current Outpatient Medications (Cardiovascular):    rosuvastatin (CRESTOR) 10 MG tablet, TAKE 1 TABLET(10 MG) BY MOUTH DAILY   tadalafil (CIALIS) 20 MG tablet, Take 1 tablet (20 mg total) by mouth See admin instructions. Take 20 mg by mouth one hour prior to intercourse- maximum of 20 mg (1 tablet) in 24 hours   Current Outpatient Medications (Analgesics):    meloxicam (MOBIC) 15 MG tablet, TAKE 1 TABLET(15 MG) BY MOUTH DAILY   Current Outpatient Medications (Other):    pantoprazole (PROTONIX) 40 MG tablet, TAKE 1 TABLET(40 MG) BY MOUTH DAILY   Suvorexant (BELSOMRA) 15 MG TABS, Take 1 tablet by mouth at bedtime as needed.   valACYclovir (VALTREX) 1000 MG tablet, Take 0.5 tablets (500 mg total) by mouth daily.   Vitamin D, Ergocalciferol, (DRISDOL) 1.25 MG (50000 UNIT) CAPS capsule, TAKE ONE CAPSULE BY MOUTH EVERY SATURDAY   Reviewed prior external information including notes and imaging from  primary care provider As well as notes that were available from care everywhere and other healthcare systems.  Reviewed previous imaging as well including the CT abdomen and pelvis in October 2022 showing the patient did have a small inguinal hernia but otherwise fairly unremarkable.  CGM  Past medical history, social, surgical and family history all reviewed in electronic medical record.  No pertanent information unless stated regarding to the chief complaint.   Review of Systems:  No headache, visual changes, nausea, vomiting,  diarrhea, constipation, dizziness, abdominal pain, skin rash, fevers, chills, night sweats, weight loss, swollen lymph nodes, body aches, joint swelling, chest pain, shortness of breath, mood changes. POSITIVE muscle aches  Objective  Blood pressure 116/78, pulse 72, height '6\' 1"'$  (1.854 m), weight 192 lb (87.1 kg), SpO2 90 %.   General: No apparent distress alert and oriented x3 mood and affect normal, dressed appropriately.  HEENT: Pupils equal, extraocular movements intact  Respiratory: Patient's speak in full sentences and does not appear short of breath  Cardiovascular: No lower extremity edema, non tender, no erythema  Antalgic gait noted.  Favoring  right knee.  Crepitus noted with range of motion right and left. Patient does have some lateral tracking of the patella noted.  Limited muscular skeletal ultrasound was performed and interpreted by Hulan Saas, M  Limited ultrasound shows some narrowing of the patellofemoral joint noted.  Mild hypoechoic changes consistent with an effusion no floating substances. Impression: Patellofemoral arthritis    Impression and Recommendations:    The above documentation has been reviewed and is accurate and complete Lyndal Pulley, DO

## 2022-08-19 ENCOUNTER — Ambulatory Visit (INDEPENDENT_AMBULATORY_CARE_PROVIDER_SITE_OTHER): Payer: PPO

## 2022-08-19 ENCOUNTER — Ambulatory Visit: Payer: PPO | Admitting: Family Medicine

## 2022-08-19 ENCOUNTER — Encounter: Payer: Self-pay | Admitting: Family Medicine

## 2022-08-19 VITALS — BP 116/78 | HR 72 | Ht 73.0 in | Wt 192.0 lb

## 2022-08-19 DIAGNOSIS — M25561 Pain in right knee: Secondary | ICD-10-CM | POA: Diagnosis not present

## 2022-08-19 DIAGNOSIS — M1711 Unilateral primary osteoarthritis, right knee: Secondary | ICD-10-CM

## 2022-08-19 NOTE — Patient Instructions (Addendum)
Xray today  Do prescribed exercises at least 3x a week Brace Ice and Voltaren

## 2022-08-19 NOTE — Assessment & Plan Note (Signed)
Reviewed anatomy using anatomical model and how PFS occurs.  Given rehab exercises handout for VMO, hip abductors, core, entire kinetic chain including proprioception exercises.  Could benefit from PT, regular exercise, upright biking, and a PFS knee brace to assist with tracking abnormalities. Worsening pain will consider injection and formal physical therapy.

## 2022-09-14 ENCOUNTER — Other Ambulatory Visit: Payer: Self-pay | Admitting: Internal Medicine

## 2022-09-14 DIAGNOSIS — M159 Polyosteoarthritis, unspecified: Secondary | ICD-10-CM

## 2022-10-02 NOTE — Progress Notes (Signed)
[name] [place] [address] [place] Phone: [phone] Subjective:   IVilma Meckel, am serving as a scribe for Dr. Hulan Saas.  I'm seeing this patient by the request  of:  Janith Lima, MD  CC: right knee pain   YSA:[id number]  08/19/2022 Reviewed anatomy using anatomical model and how PFS occurs.   Given rehab exercises handout for VMO, hip abductors, core, entire kinetic chain including proprioception exercises.   Could benefit from PT, regular exercise, upright biking, and a PFS knee brace to assist with tracking abnormalities. Worsening pain will consider injection and formal physical therapy.  Update 10/03/2022 Jonathan Young is a 71 y.o. male coming in with complaint of R knee pain.  Seems to be more patellofemoral arthritis.  Given home exercises and wanted to try conservative therapy including oral antiinflammatories such as meloxicam.  Patient states doing well. Progressing in the right direction. No pain in knees today. Wants to ask about ankles. Thinks he's developing overall arthritis.      Past Medical History:  Diagnosis Date   Complication of anesthesia    sore throat from tube   Dysrhythmia    PVC'S   GERD (gastroesophageal reflux disease)    Hypercholesteremia    Osteoarthritis    Umbilical hernia    Past Surgical History:  Procedure Laterality Date   CARPAL TUNNEL RELEASE Left 05/23/2018   CARPAL TUNNEL RELEASE Right 11/22/2018   CERVICAL SPINE SURGERY     HEMORRHOID SURGERY  1982   INSERTION OF MESH N/A 07/27/2020   Procedure: INSERTION OF MESH;  Surgeon: Erroll Luna, MD;  Location: [facility];  Service: General;  Laterality: N/A;   SHOULDER SURGERY     BIL   TONSILLECTOMY AND ADENOIDECTOMY     TOTAL HIP ARTHROPLASTY     RT TOAL HIP   TOTAL HIP ARTHROPLASTY  06/01/2012   Procedure: TOTAL HIP ARTHROPLASTY ANTERIOR APPROACH;  Surgeon: Mauri Pole, MD;  Location: [facility];   Service: Orthopedics;  Laterality: Left;   UMBILICAL HERNIA REPAIR N/A 07/27/2020   Procedure: UMBILICAL HERNIA REPAIR WITH MESH;  Surgeon: Erroll Luna, MD;  Location: [facility];  Service: General;  Laterality: N/A;   Social History   Socioeconomic History   Marital status: Single    Spouse name: Not on file   Number of children: Not on file   Years of education: Not on file   Highest education level: Not on file  Occupational History   Not on file  Tobacco Use   Smoking status: Former    Types: Cigarettes    Quit date: 05/27/1981    Years since quitting: 41.3   Smokeless tobacco: Never  Vaping Use   Vaping Use: Never used  Substance and Sexual Activity   Alcohol use: Yes    Alcohol/week: 20.0 standard drinks of alcohol    Types: 20 Shots of liquor per week    Comment: wine nightly   Drug use: No   Sexual activity: Yes    Partners: Male  Other Topics Concern   Not on file  Social History Narrative   Not on file   Social Determinants of Health   Financial Resource Strain: Low Risk  (08/26/2018)   Overall Financial Resource Strain (CARDIA)    Difficulty of Paying Living Expenses: Not hard at all  Food Insecurity: No Food Insecurity (08/26/2018)   Hunger Vital Sign    Worried About Running Out of  Food in the Last Year: Never true    [place] in the Last Year: Never true  Transportation Needs: No Transportation Needs (08/26/2018)   PRAPARE - Hydrologist (Medical): No    Lack of Transportation (Non-Medical): No  Physical Activity: Sufficiently Active (08/26/2018)   Exercise Vital Sign    Days of Exercise per Week: 5 days    Minutes of Exercise per Session: 60 min  Stress: No Stress Concern Present (08/26/2018)   [place]    Feeling of Stress : Only a little  Social Connections: Unknown (08/26/2018)   Social Connection and Isolation Panel [NHANES]    Frequency  of Communication with Friends and Family: More than three times a week    Frequency of Social Gatherings with Friends and Family: More than three times a week    Attends Religious Services: Not on Advertising copywriter or Organizations: Not on file    Attends Archivist Meetings: Not on file    Marital Status: Not on file   Allergies  Allergen Reactions   Vytorin [Ezetimibe-Simvastatin] Other (See Comments)    Muscle aches   Family History  Problem Relation Age of Onset   Emphysema Mother    Colon cancer Neg Hx    Esophageal cancer Neg Hx    Rectal cancer Neg Hx    Stomach cancer Neg Hx      Current Outpatient Medications (Cardiovascular):    rosuvastatin (CRESTOR) 10 MG tablet, TAKE 1 TABLET(10 MG) BY MOUTH DAILY   tadalafil (CIALIS) 20 MG tablet, Take 1 tablet (20 mg total) by mouth See admin instructions. Take 20 mg by mouth one hour prior to intercourse- maximum of 20 mg (1 tablet) in 24 hours   Current Outpatient Medications (Analgesics):    meloxicam (MOBIC) 15 MG tablet, TAKE 1 TABLET(15 MG) BY MOUTH DAILY   Current Outpatient Medications (Other):    pantoprazole (PROTONIX) 40 MG tablet, TAKE 1 TABLET(40 MG) BY MOUTH DAILY   Suvorexant (BELSOMRA) 15 MG TABS, Take 1 tablet by mouth at bedtime as needed.   valACYclovir (VALTREX) 1000 MG tablet, Take 0.5 tablets (500 mg total) by mouth daily.   Vitamin D, Ergocalciferol, (DRISDOL) 1.25 MG (50000 UNIT) CAPS capsule, TAKE ONE CAPSULE BY MOUTH EVERY SATURDAY   Reviewed prior external information including notes and imaging from  primary care provider As well as notes that were available from care everywhere and other healthcare systems.  Past medical history, social, surgical and family history all reviewed in electronic medical record.  No pertanent information unless stated regarding to the chief complaint.   Review of Systems:  No headache, visual changes, nausea, vomiting, diarrhea, constipation,  dizziness, abdominal pain, skin rash, fevers, chills, night sweats, weight loss, swollen lymph nodes, body aches, joint swelling, chest pain, shortness of breath, mood changes. POSITIVE muscle aches  Objective  Blood pressure 122/80, pulse 77, height '6\' 1"'$  (1.854 m), weight 200 lb (90.7 kg), SpO2 96 %.   General: No apparent distress alert and oriented x3 mood and affect normal, dressed appropriately.  HEENT: Pupils equal, extraocular movements intact  Respiratory: Patient's speak in full sentences and does not appear short of breath  Cardiovascular: No lower extremity edema, non tender, no erythema  Right knee pain mild ttp  Bilateral ankles does have some very mild tightness.  Very mild discomfort noted on the medial aspect of the  knees.  [id number]; 15 additional minutes spent for Therapeutic exercises as stated in above notes.  This included exercises focusing on stretching, strengthening, with significant focus on eccentric aspects.   Long term goals include an improvement in range of motion, strength, endurance as well as avoiding reinjury. Patient's frequency would include in 1-2 times a day, 3-5 times a week for a duration of 6-12 weeks.  Ankle strengthening that included:  Basic range of motion exercises to allow proper full motion at ankle Stretching of the lower leg and hamstrings  Theraband exercises for the lower leg - inversion, eversion, dorsiflexion and plantarflexion each to be completed with a theraband Balance exercises to increase proprioception Weight bearing exercises to increase strength and balance Proper technique shown and discussed handout in great detail with ATC.  All questions were discussed and answered.     Impression and Recommendations:    The above documentation has been reviewed and is accurate and complete Lyndal Pulley, DO

## 2022-10-03 ENCOUNTER — Ambulatory Visit: Payer: PPO | Admitting: Family Medicine

## 2022-10-03 ENCOUNTER — Encounter: Payer: Self-pay | Admitting: Family Medicine

## 2022-10-03 DIAGNOSIS — M76821 Posterior tibial tendinitis, right leg: Secondary | ICD-10-CM

## 2022-10-03 DIAGNOSIS — M76822 Posterior tibial tendinitis, left leg: Secondary | ICD-10-CM

## 2022-10-03 DIAGNOSIS — M1711 Unilateral primary osteoarthritis, right knee: Secondary | ICD-10-CM

## 2022-10-03 NOTE — Patient Instructions (Addendum)
Do prescribed exercises at least 3x a week Hoka or Oofos recovery sandals in the house Arnica or Voltaren Gel

## 2022-10-03 NOTE — Assessment & Plan Note (Signed)
Stable at the moment.  No significant changes.  We will follow-up again in 2 to 3 months.

## 2022-10-03 NOTE — Assessment & Plan Note (Signed)
Posterior tibialis tendinitis bilaterally.  Seems to be right greater than left.  Discussed with patient about recovery sandals, given exercises that I think will be helpful as well.  Discussed which activities to do which ones to avoid.  Discussed still using anti-inflammatories as needed.  Follow-up again in 2 to 3 months

## 2022-10-17 ENCOUNTER — Ambulatory Visit (HOSPITAL_COMMUNITY)
Admission: RE | Admit: 2022-10-17 | Discharge: 2022-10-17 | Disposition: A | Discharge status: Home / Self Care | Payer: PPO | Source: Ambulatory Visit | Attending: Emergency Medicine | Admitting: Emergency Medicine

## 2022-10-17 ENCOUNTER — Encounter (HOSPITAL_COMMUNITY): Payer: Self-pay

## 2022-10-17 ENCOUNTER — Ambulatory Visit (INDEPENDENT_AMBULATORY_CARE_PROVIDER_SITE_OTHER): Payer: PPO

## 2022-10-17 VITALS — BP 153/77 | HR 78 | Temp 98.5°F | Resp 18

## 2022-10-17 DIAGNOSIS — R0789 Other chest pain: Secondary | ICD-10-CM | POA: Diagnosis not present

## 2022-10-17 DIAGNOSIS — R0602 Shortness of breath: Secondary | ICD-10-CM | POA: Diagnosis not present

## 2022-10-17 DIAGNOSIS — J069 Acute upper respiratory infection, unspecified: Secondary | ICD-10-CM | POA: Diagnosis not present

## 2022-10-17 DIAGNOSIS — R079 Chest pain, unspecified: Secondary | ICD-10-CM | POA: Diagnosis not present

## 2022-10-17 DIAGNOSIS — R059 Cough, unspecified: Secondary | ICD-10-CM | POA: Diagnosis not present

## 2022-10-17 NOTE — ED Triage Notes (Signed)
Pt c/o cough and head/chest/nasal congestion since Monday. States came in from the [place] on Saturday night. States has had 2 neg home covid test. Taking OTC meds with no relief.

## 2022-10-17 NOTE — Discharge Instructions (Signed)
Your chest xray was negative.  You likely have a virus causing your symptoms.  I recommend continuing symptomatic care at home. Mucinex (guaifenesin) 600 mg BID for cough and congestion Drink lots of fluids.  Hopefully your symptoms will taper off over the next few days. Cough can sometimes linger for a few weeks.

## 2022-10-17 NOTE — ED Provider Notes (Signed)
[facility]    CSN: [id number] Arrival date & time: 10/17/22  1426     History   Chief Complaint Chief Complaint  Patient presents with   Cough    Cold, but also a very deep cough. - Entered by patient    HPI Jonathan Young is a 71 y.o. male.  Presents with 3 day history of nasal congestion, productive cough Last night had chest tightness and shortness of breath Chest tightness and mucous loosened up today Returned from [place] sat night. No known fevers  Has been trying Coricidin and mucinex, which help Two negative covid tests  Past Medical History:  Diagnosis Date   Complication of anesthesia    sore throat from tube   Dysrhythmia    PVC'S   GERD (gastroesophageal reflux disease)    Hypercholesteremia    Osteoarthritis    Umbilical hernia     Patient Active Problem List   Diagnosis Date Noted   Posterior tibialis tendinitis of both lower extremities 10/03/2022   Patellofemoral arthritis of right knee 08/19/2022   Acute gastroenteropathy due to Norovirus 04/26/2022   Psychophysiological insomnia 06/06/2020   Fatty infiltration of liver 03/02/2020   Nodule of lower lobe of right lung 02/14/2020   Maxillary sinusitis, chronic 06/29/2017   CRI (chronic renal insufficiency), stage 3 (moderate) ([place]) 04/07/2017   Pure hyperglyceridemia 04/03/2017   Essential hypertension 11/07/2016   GAD (generalized anxiety disorder) 10/17/2015   Erectile dysfunction of organic origin 10/10/2015   PSA elevation 01/10/2015   High risk homosexual behavior 12/29/2014   Routine general medical examination at a health care facility 12/29/2014   GERD (gastroesophageal reflux disease) 04/23/2011   IBS (irritable bowel syndrome) 04/23/2011   Vitamin D deficiency 01/16/2011   DUPUYTREN'S CONTRACTURE 01/16/2011   Hypogonadism male 07/25/2010   IDIOPATHIC OSTEOPOROSIS 11/01/2008   Vitamin B 12 deficiency 03/22/2008   Osteoarthritis 08/25/2007   Hyperlipidemia with target  LDL less than 130 08/03/2007    Past Surgical History:  Procedure Laterality Date   CARPAL TUNNEL RELEASE Left 05/23/2018   CARPAL TUNNEL RELEASE Right 11/22/2018   CERVICAL SPINE SURGERY     HEMORRHOID SURGERY  1982   INSERTION OF MESH N/A 07/27/2020   Procedure: INSERTION OF MESH;  Surgeon: Erroll Luna, MD;  Location: [facility];  Service: General;  Laterality: N/A;   SHOULDER SURGERY     BIL   TONSILLECTOMY AND ADENOIDECTOMY     TOTAL HIP ARTHROPLASTY     RT TOAL HIP   TOTAL HIP ARTHROPLASTY  06/01/2012   Procedure: TOTAL HIP ARTHROPLASTY ANTERIOR APPROACH;  Surgeon: Mauri Pole, MD;  Location: [facility];  Service: Orthopedics;  Laterality: Left;   UMBILICAL HERNIA REPAIR N/A 07/27/2020   Procedure: UMBILICAL HERNIA REPAIR WITH MESH;  Surgeon: Erroll Luna, MD;  Location: [facility];  Service: General;  Laterality: N/A;       Home Medications    Prior to Admission medications   Medication Sig Start Date End Date Taking? Authorizing Provider  meloxicam (MOBIC) 15 MG tablet TAKE 1 TABLET(15 MG) BY MOUTH DAILY 09/14/22   Janith Lima, MD  pantoprazole (PROTONIX) 40 MG tablet TAKE 1 TABLET(40 MG) BY MOUTH DAILY 05/30/22   Janith Lima, MD  rosuvastatin (CRESTOR) 10 MG tablet TAKE 1 TABLET(10 MG) BY MOUTH DAILY 05/08/22   Janith Lima, MD  Suvorexant (BELSOMRA) 15 MG TABS Take 1 tablet by mouth at bedtime as needed. 08/09/22   Scarlette Calico  L, MD  tadalafil (CIALIS) 20 MG tablet Take 1 tablet (20 mg total) by mouth See admin instructions. Take 20 mg by mouth one hour prior to intercourse- maximum of 20 mg (1 tablet) in 24 hours 04/29/22   Janith Lima, MD  valACYclovir (VALTREX) 1000 MG tablet Take 0.5 tablets (500 mg total) by mouth daily. 08/26/18   Janith Lima, MD  Vitamin D, Ergocalciferol, (DRISDOL) 1.25 MG (50000 UNIT) CAPS capsule TAKE ONE CAPSULE BY MOUTH EVERY SATURDAY 10/08/21   Janith Lima, MD    Family History Family History   Problem Relation Age of Onset   Emphysema Mother    Colon cancer Neg Hx    Esophageal cancer Neg Hx    Rectal cancer Neg Hx    Stomach cancer Neg Hx     Social History Social History   Tobacco Use   Smoking status: Former    Types: Cigarettes    Quit date: 05/27/1981    Years since quitting: 41.4   Smokeless tobacco: Never  Vaping Use   Vaping Use: Never used  Substance Use Topics   Alcohol use: Yes    Alcohol/week: 20.0 standard drinks of alcohol    Types: 20 Shots of liquor per week    Comment: wine nightly   Drug use: No     Allergies   Vytorin [ezetimibe-simvastatin]   Review of Systems Review of Systems  Respiratory:  Positive for cough.    Per HPI  Physical Exam Triage Vital Signs ED Triage Vitals  Enc Vitals Group     BP 10/17/22 1518 (!) 153/77     Pulse Rate 10/17/22 1518 78     Resp 10/17/22 1518 18     Temp 10/17/22 1518 98.5 F (36.9 C)     Temp Source 10/17/22 1518 Oral     SpO2 10/17/22 1518 96 %     Weight --      Height --      Head Circumference --      Peak Flow --      Pain Score 10/17/22 1519 0     Pain Loc --      Pain Edu? --      Excl. in [place]? --    No data found.  Updated Vital Signs BP (!) 153/77 (BP Location: Left Arm)   Pulse 78   Temp 98.5 F (36.9 C) (Oral)   Resp 18   SpO2 96%    Physical Exam Constitutional:      General: He is not in acute distress.    Appearance: Normal appearance. He is not ill-appearing.     Comments: Appears younger than stated age  HENT:     Nose: Congestion present.     Mouth/Throat:     Mouth: Mucous membranes are moist.     Pharynx: Oropharynx is clear.  Eyes:     Conjunctiva/sclera: Conjunctivae normal.  Cardiovascular:     Rate and Rhythm: Normal rate and regular rhythm.     Heart sounds: Normal heart sounds.  Pulmonary:     Effort: Pulmonary effort is normal.     Breath sounds: Normal breath sounds.  Lymphadenopathy:     Cervical: No cervical adenopathy.  Skin:     General: Skin is warm and dry.  Neurological:     Mental Status: He is alert and oriented to person, place, and time.      UC Treatments / Results  Labs (all labs ordered are listed, but  only abnormal results are displayed) Labs Reviewed - No data to display  EKG  Radiology DG Chest 2 View  Result Date: 10/17/2022 CLINICAL DATA:  Cough, chest tightness, short of breath EXAM: CHEST - 2 VIEW COMPARISON:  07/08/2017 FINDINGS: Frontal and lateral views of the chest demonstrate an unremarkable cardiac silhouette. No airspace disease, effusion, or pneumothorax. No acute bony abnormality. IMPRESSION: 1. No acute intrathoracic process. Electronically Signed   By: Randa Ngo M.D.   On: 10/17/2022 15:48    Procedures Procedures (including critical care time)  Medications Ordered in UC Medications - No data to display  Initial Impression / Assessment and Plan / UC Course  I have reviewed the triage vital signs and the nursing notes.  Pertinent labs & imaging results that were available during my care of the patient were reviewed by me and considered in my medical decision making (see chart for details).  Overall well appearing, afebrile Chest xray obtained due to chest congestion/tightness, patient preference Xray negative.  Discussed use of mucinex 600 mg BID for congestion relief Lots of fluids, other symptomatic care. Nasal spray/flonase Return precautions discussed. Patient agrees to plan  Final Clinical Impressions(s) / UC Diagnoses   Final diagnoses:  Viral URI with cough     Discharge Instructions      Your chest xray was negative.  You likely have a virus causing your symptoms.  I recommend continuing symptomatic care at home. Mucinex (guaifenesin) 600 mg BID for cough and congestion Drink lots of fluids.  Hopefully your symptoms will taper off over the next few days. Cough can sometimes linger for a few weeks.     ED Prescriptions   None    PDMP not  reviewed this encounter.   Vere Diantonio, [place], [place] 10/17/22 1723

## 2022-10-22 ENCOUNTER — Ambulatory Visit (INDEPENDENT_AMBULATORY_CARE_PROVIDER_SITE_OTHER): Payer: PPO

## 2022-10-22 VITALS — Ht 73.0 in

## 2022-10-22 DIAGNOSIS — Z0000 Encounter for general adult medical examination without abnormal findings: Secondary | ICD-10-CM | POA: Diagnosis not present

## 2022-10-22 NOTE — Patient Instructions (Signed)
[name] , Thank you for taking time to come for your Medicare Wellness Visit. I appreciate your ongoing commitment to your health goals. Please review the following plan we discussed and let me know if I can assist you in the future.   These are the goals we discussed:  Goals      Client understands the importance of follow-up with providers by attending scheduled visits        This is a list of the screening recommended for you and due dates:  Health Maintenance  Topic Date Due   COVID-19 Vaccine (6 - Pfizer series) 01/18/2023   Medicare Annual Wellness Visit  10/23/2023   Tetanus Vaccine  03/17/2026   Colon Cancer Screening  01/31/2028   Pneumonia Vaccine  Completed   Flu Shot  Completed   Hepatitis C Screening: USPSTF Recommendation to screen - Ages 18-79 yo.  Completed   Zoster (Shingles) Vaccine  Completed   HPV Vaccine  Aged Out    Advanced directives: Please bring a copy of your health care power of attorney and living will to the office at your convenience.  Conditions/risks identified: Yes  Next appointment: Follow up in one year for your annual wellness visit.   Preventive Care 65 Years and Older, Male  Preventive care refers to lifestyle choices and visits with your health care provider that can promote health and wellness. What does preventive care include? A yearly physical exam. This is also called an annual well check. Dental exams once or twice a year. Routine eye exams. Ask your health care provider how often you should have your eyes checked. Personal lifestyle choices, including: Daily care of your teeth and gums. Regular physical activity. Eating a healthy diet. Avoiding tobacco and drug use. Limiting alcohol use. Practicing safe sex. Taking low doses of aspirin every day. Taking vitamin and mineral supplements as recommended by your health care provider. What happens during an annual well check? The services and screenings done by your health  care provider during your annual well check will depend on your age, overall health, lifestyle risk factors, and family history of disease. Counseling  Your health care provider may ask you questions about your: Alcohol use. Tobacco use. Drug use. Emotional well-being. Home and relationship well-being. Sexual activity. Eating habits. History of falls. Memory and ability to understand (cognition). Work and work Statistician. Screening  You may have the following tests or measurements: Height, weight, and BMI. Blood pressure. Lipid and cholesterol levels. These may be checked every 5 years, or more frequently if you are over 78 years old. Skin check. Lung cancer screening. You may have this screening every year starting at age 75 if you have a 30-pack-year history of smoking and currently smoke or have quit within the past 15 years. Fecal occult blood test (FOBT) of the stool. You may have this test every year starting at age 69. Flexible sigmoidoscopy or colonoscopy. You may have a sigmoidoscopy every 5 years or a colonoscopy every 10 years starting at age 34. Prostate cancer screening. Recommendations will vary depending on your family history and other risks. Hepatitis C blood test. Hepatitis B blood test. Sexually transmitted disease (STD) testing. Diabetes screening. This is done by checking your blood sugar (glucose) after you have not eaten for a while (fasting). You may have this done every 1-3 years. Abdominal aortic aneurysm (AAA) screening. You may need this if you are a current or former smoker. Osteoporosis. You may be screened starting at age 86  if you are at high risk. Talk with your health care provider about your test results, treatment options, and if necessary, the need for more tests. Vaccines  Your health care provider may recommend certain vaccines, such as: Influenza vaccine. This is recommended every year. Tetanus, diphtheria, and acellular pertussis (Tdap, Td)  vaccine. You may need a Td booster every 10 years. Zoster vaccine. You may need this after age 78. Pneumococcal 13-valent conjugate (PCV13) vaccine. One dose is recommended after age 63. Pneumococcal polysaccharide (PPSV23) vaccine. One dose is recommended after age 72. Talk to your health care provider about which screenings and vaccines you need and how often you need them. This information is not intended to replace advice given to you by your health care provider. Make sure you discuss any questions you have with your health care provider. Document Released: 01/05/2016 Document Revised: 08/28/2016 Document Reviewed: 10/10/2015 Elsevier Interactive Patient Education  2017 [place] Prevention in the Home Falls can cause injuries. They can happen to people of all ages. There are many things you can do to make your home safe and to help prevent falls. What can I do on the outside of my home? Regularly fix the edges of walkways and driveways and fix any cracks. Remove anything that might make you trip as you walk through a door, such as a raised step or threshold. Trim any bushes or trees on the path to your home. Use bright outdoor lighting. Clear any walking paths of anything that might make someone trip, such as rocks or tools. Regularly check to see if handrails are loose or broken. Make sure that both sides of any steps have handrails. Any raised decks and porches should have guardrails on the edges. Have any leaves, snow, or ice cleared regularly. Use sand or salt on walking paths during winter. Clean up any spills in your garage right away. This includes oil or grease spills. What can I do in the bathroom? Use night lights. Install grab bars by the toilet and in the tub and shower. Do not use towel bars as grab bars. Use non-skid mats or decals in the tub or shower. If you need to sit down in the shower, use a plastic, non-slip stool. Keep the floor dry. Clean up any  water that spills on the floor as soon as it happens. Remove soap buildup in the tub or shower regularly. Attach bath mats securely with double-sided non-slip rug tape. Do not have throw rugs and other things on the floor that can make you trip. What can I do in the bedroom? Use night lights. Make sure that you have a light by your bed that is easy to reach. Do not use any sheets or blankets that are too big for your bed. They should not hang down onto the floor. Have a firm chair that has side arms. You can use this for support while you get dressed. Do not have throw rugs and other things on the floor that can make you trip. What can I do in the kitchen? Clean up any spills right away. Avoid walking on wet floors. Keep items that you use a lot in easy-to-reach places. If you need to reach something above you, use a strong step stool that has a grab bar. Keep electrical cords out of the way. Do not use floor polish or wax that makes floors slippery. If you must use wax, use non-skid floor wax. Do not have throw rugs and other things  on the floor that can make you trip. What can I do with my stairs? Do not leave any items on the stairs. Make sure that there are handrails on both sides of the stairs and use them. Fix handrails that are broken or loose. Make sure that handrails are as long as the stairways. Check any carpeting to make sure that it is firmly attached to the stairs. Fix any carpet that is loose or worn. Avoid having throw rugs at the top or bottom of the stairs. If you do have throw rugs, attach them to the floor with carpet tape. Make sure that you have a light switch at the top of the stairs and the bottom of the stairs. If you do not have them, ask someone to add them for you. What else can I do to help prevent falls? Wear shoes that: Do not have high heels. Have rubber bottoms. Are comfortable and fit you well. Are closed at the toe. Do not wear sandals. If you use a  stepladder: Make sure that it is fully opened. Do not climb a closed stepladder. Make sure that both sides of the stepladder are locked into place. Ask someone to hold it for you, if possible. Clearly mark and make sure that you can see: Any grab bars or handrails. First and last steps. Where the edge of each step is. Use tools that help you move around (mobility aids) if they are needed. These include: Canes. Walkers. Scooters. Crutches. Turn on the lights when you go into a dark area. Replace any light bulbs as soon as they burn out. Set up your furniture so you have a clear path. Avoid moving your furniture around. If any of your floors are uneven, fix them. If there are any pets around you, be aware of where they are. Review your medicines with your doctor. Some medicines can make you feel dizzy. This can increase your chance of falling. Ask your doctor what other things that you can do to help prevent falls. This information is not intended to replace advice given to you by your health care provider. Make sure you discuss any questions you have with your health care provider. Document Released: 10/05/2009 Document Revised: 05/16/2016 Document Reviewed: 01/13/2015 Elsevier Interactive Patient Education  2017 [employer].

## 2022-10-22 NOTE — Progress Notes (Signed)
Virtual Visit via Telephone Note  I connected with  Jonathan Young on 10/22/22 at  9:15 AM EDT by telephone and verified that I am speaking with the correct person using two identifiers.  Location: Patient: Home Provider: [place] Persons participating in the virtual visit: [place]   I discussed the limitations, risks, security and privacy concerns of performing an evaluation and management service by telephone and the availability of in person appointments. The patient expressed understanding and agreed to proceed.  Interactive audio and video telecommunications were attempted between this nurse and patient, however failed, due to patient having technical difficulties OR patient did not have access to video capability.  We continued and completed visit with audio only.  Some vital signs may be absent or patient reported.   Sheral Flow, LPN  Subjective:   Jonathan Young is a 71 y.o. male who presents for Medicare Annual/Subsequent preventive examination.  Review of Systems     Cardiac Risk Factors include: advanced age (>84mn, >>71women);dyslipidemia;hypertension;male gender     Objective:    Today's Vitals   10/22/22 0920  Height: '6\' 1"'$  (1.854 m)  PainSc: 0-No pain   Body mass index is 26.39 kg/m.     10/22/2022    9:28 AM 07/27/2020    6:46 AM 07/21/2020   11:35 AM 04/02/2019    4:23 PM 08/26/2018    3:39 PM 08/20/2017    5:21 PM 08/20/2017    4:31 PM  Advanced Directives  Does Patient Have a Medical Advance Directive? Yes Yes Yes No Yes No No  Type of [place] Living will Living will  [place]Living will    Does patient want to make changes to medical advance directive?  No - Guardian declined No - Patient declined      Copy of [place] Chart? No - copy requested    No - copy requested    Would patient like information on creating a medical  advance directive?    No - Patient declined       Current Medications (verified) Outpatient Encounter Medications as of 10/22/2022  Medication Sig   meloxicam (MOBIC) 15 MG tablet TAKE 1 TABLET(15 MG) BY MOUTH DAILY   pantoprazole (PROTONIX) 40 MG tablet TAKE 1 TABLET(40 MG) BY MOUTH DAILY   rosuvastatin (CRESTOR) 10 MG tablet TAKE 1 TABLET(10 MG) BY MOUTH DAILY   Suvorexant (BELSOMRA) 15 MG TABS Take 1 tablet by mouth at bedtime as needed.   tadalafil (CIALIS) 20 MG tablet Take 1 tablet (20 mg total) by mouth See admin instructions. Take 20 mg by mouth one hour prior to intercourse- maximum of 20 mg (1 tablet) in 24 hours   valACYclovir (VALTREX) 1000 MG tablet Take 0.5 tablets (500 mg total) by mouth daily.   Vitamin D, Ergocalciferol, (DRISDOL) 1.25 MG (50000 UNIT) CAPS capsule TAKE ONE CAPSULE BY MOUTH EVERY SATURDAY   No facility-administered encounter medications on file as of 10/22/2022.    Allergies (verified) Vytorin [ezetimibe-simvastatin]   History: Past Medical History:  Diagnosis Date   Complication of anesthesia    sore throat from tube   Dysrhythmia    PVC'S   GERD (gastroesophageal reflux disease)    Hypercholesteremia    Osteoarthritis    Umbilical hernia    Past Surgical History:  Procedure Laterality Date   CARPAL TUNNEL RELEASE Left 05/23/2018   CARPAL TUNNEL RELEASE Right 11/22/2018   CERVICAL  [place]   INSERTION OF MESH N/A 07/27/2020   Procedure: INSERTION OF MESH;  Surgeon: Erroll Luna, MD;  Location: [facility];  Service: General;  Laterality: N/A;   SHOULDER SURGERY     BIL   TONSILLECTOMY AND ADENOIDECTOMY     TOTAL HIP ARTHROPLASTY     RT TOAL HIP   TOTAL HIP ARTHROPLASTY  06/01/2012   Procedure: TOTAL HIP ARTHROPLASTY ANTERIOR APPROACH;  Surgeon: Mauri Pole, MD;  Location: [facility];  Service: Orthopedics;  Laterality: Left;   UMBILICAL HERNIA REPAIR N/A 07/27/2020   Procedure: UMBILICAL  HERNIA REPAIR WITH MESH;  Surgeon: Erroll Luna, MD;  Location: [facility];  Service: General;  Laterality: N/A;   Family History  Problem Relation Age of Onset   Emphysema Mother    Colon cancer Neg Hx    Esophageal cancer Neg Hx    Rectal cancer Neg Hx    Stomach cancer Neg Hx    Social History   Socioeconomic History   Marital status: Single    Spouse name: Not on file   Number of children: Not on file   Years of education: Not on file   Highest education level: Not on file  Occupational History   Not on file  Tobacco Use   Smoking status: Former    Types: Cigarettes    Quit date: 05/27/1981    Years since quitting: 41.4   Smokeless tobacco: Never  Vaping Use   Vaping Use: Never used  Substance and Sexual Activity   Alcohol use: Yes    Alcohol/week: 20.0 standard drinks of alcohol    Types: 20 Shots of liquor per week    Comment: wine nightly   Drug use: No   Sexual activity: Yes    Partners: Male  Other Topics Concern   Not on file  Social History Narrative   Not on file   Social Determinants of Health   Financial Resource Strain: Low Risk  (10/22/2022)   Overall Financial Resource Strain (CARDIA)    Difficulty of Paying Living Expenses: Not hard at all  Food Insecurity: No Food Insecurity (10/22/2022)   Hunger Vital Sign    Worried About Running Out of Food in the Last Year: Never true    Ran Out of Food in the Last Year: Never true  Transportation Needs: No Transportation Needs (10/22/2022)   PRAPARE - Hydrologist (Medical): No    Lack of Transportation (Non-Medical): No  Physical Activity: Sufficiently Active (10/22/2022)   Exercise Vital Sign    Days of Exercise per Week: 4 days    Minutes of Exercise per Session: 40 min  Stress: Stress Concern Present (10/22/2022)   [place]    Feeling of Stress : To some extent  Social Connections:  Unknown (10/22/2022)   Social Connection and Isolation Panel [NHANES]    Frequency of Communication with Friends and Family: Three times a week    Frequency of Social Gatherings with Friends and Family: Once a week    Attends Religious Services: Not on Advertising copywriter or Organizations: No    Attends Archivist Meetings: Patient refused    Marital Status: Living with partner    Tobacco Counseling Counseling given: Not Answered   Clinical Intake:  Pre-visit preparation completed: Yes  Pain : No/denies pain Pain Score:  0-No pain     BMI - recorded: 26.39 (10/03/2022) Nutritional Risks: None Diabetes: No  How often do you need to have someone help you when you read instructions, pamphlets, or other written materials from your doctor or pharmacy?: 1 - Never What is the last grade level you completed in school?: HSG  Diabetic? no  Interpreter Needed?: No  Information entered by :: Lisette Abu, LPN.   Activities of Daily Living    10/22/2022    9:29 AM 10/20/2022    4:57 PM  In your present state of health, do you have any difficulty performing the following activities:  Hearing? 0 0  Vision? 0 0  Difficulty concentrating or making decisions? 0 0  Walking or climbing stairs? 0 0  Dressing or bathing? 0 0  Doing errands, shopping? 0 0  Preparing Food and eating ? N N  Using the Toilet? N N  In the past six months, have you accidently leaked urine? N N  Do you have problems with loss of bowel control? N N  Managing your Medications? N N  Managing your Finances? N N  Housekeeping or managing your Housekeeping? N N    Patient Care Team: Janith Lima, MD as PCP - General (Internal Medicine)  Indicate any recent Medical Services you may have received from other than [facility] providers in the past year (date may be approximate).     Assessment:   This is a routine wellness examination for [place].  Hearing/Vision screen Hearing  Screening - Comments:: Denies hearing difficulties   Vision Screening - Comments:: No issues with vision  Dietary issues and exercise activities discussed: Current Exercise Habits: Home exercise routine, Time (Minutes): 40, Frequency (Times/Week): 4, Weekly Exercise (Minutes/Week): 160, Intensity: Moderate, Exercise limited by: None identified   Goals Addressed             This Visit's Progress    Client understands the importance of follow-up with providers by attending scheduled visits        Depression Screen    10/22/2022    9:23 AM 04/18/2021    8:09 AM 03/02/2020   11:01 AM 09/23/2019    2:02 PM 06/17/2019   12:32 PM 08/26/2018    3:39 PM 07/15/2018    2:47 PM  PHQ 2/9 Scores  PHQ - 2 Score 0 1 0 1 1 0 0  PHQ- 9 Score    4 4      Fall Risk    10/22/2022    9:29 AM 10/20/2022    4:57 PM 04/18/2021    8:10 AM 03/02/2020   11:01 AM 06/17/2019   12:32 PM  [place] in the past year? 0 0 0 0 0  Number falls in past yr: 0 0 0 0 0  Injury with Fall? 0 0 0 0 0  Risk for fall due to : No Fall Risks      Follow up Falls evaluation completed   Falls evaluation completed Falls evaluation completed    [place]:  Any stairs in or around the home? No  If so, are there any without handrails? No  Home free of loose throw rugs in walkways, pet beds, electrical cords, etc? Yes  Adequate lighting in your home to reduce risk of falls? Yes   ASSISTIVE DEVICES UTILIZED TO PREVENT FALLS:  Life alert? No  Use of a cane, walker or w/c? No  Grab bars in the bathroom?  No  Shower chair or bench in shower? No  Elevated toilet seat or a handicapped toilet? No   TIMED UP AND GO:  Was the test performed? No . Televisit  Cognitive Function:        10/22/2022    9:30 AM  6CIT Screen  What Year? 0 points  What month? 0 points  What time? 0 points  Count back from 20 0 points  Months in reverse 0 points  Repeat phrase 0 points  Total  Score 0 points    Immunizations Immunization History  Administered Date(s) Administered   Fluad Quad(high Dose 65+) 08/25/2019, 09/18/2022   Influenza Split 09/17/2012, 08/24/2013   Influenza Whole 10/23/2000, 09/28/2009, 09/20/2010   Influenza, High Dose Seasonal PF 08/26/2018   Influenza,inj,Quad PF,6+ Mos 08/24/2014, 08/18/2015   Influenza-Unspecified 10/01/2016, 11/11/2017   PFIZER Comirnaty(Gray Top)Covid-19 Tri-Sucrose Vaccine 09/18/2022   PFIZER(Purple Top)SARS-COV-2 Vaccination 01/12/2020, 01/31/2020, 09/14/2020, 04/01/2021   Pfizer Covid-19 Vaccine Bivalent Booster 66yr & up 04/22/2022   Pneumococcal Conjugate-13 10/01/2016   Pneumococcal Polysaccharide-23 11/11/2017   Td 12/23/2005   Tdap 03/17/2016   Zoster Recombinat (Shingrix) 07/16/2017, 11/22/2017   Zoster, Live 01/16/2011    TDAP status: Up to date  Flu Vaccine status: Up to date  Pneumococcal vaccine status: Up to date  Covid-19 vaccine status: Completed vaccines  Qualifies for Shingles Vaccine? Yes   Zostavax completed Yes   Shingrix Completed?: Yes  Screening Tests Health Maintenance  Topic Date Due   COVID-19 Vaccine (6 - Pfizer series) 01/18/2023   Medicare Annual Wellness (AWV)  10/23/2023   TETANUS/TDAP  03/17/2026   COLONOSCOPY (Pts 45-433yrInsurance coverage will need to be confirmed)  01/31/2028   Pneumonia Vaccine 6530Years old  Completed   INFLUENZA VACCINE  Completed   Hepatitis C Screening  Completed   Zoster Vaccines- Shingrix  Completed   HPV VACCINES  Aged Out    Health Maintenance  There are no preventive care reminders to display for this patient.   Colorectal cancer screening: Type of screening: Colonoscopy. Completed 01/30/2021. Repeat every 7 years  Lung Cancer Screening: (Low Dose CT Chest recommended if Age 71-80ears, 30 pack-year currently smoking OR have quit w/in 15years.) does not qualify.   Lung Cancer Screening Referral: no  Additional Screening:  Hepatitis  C Screening: does qualify; Completed 12/29/2014  Vision Screening: Recommended annual ophthalmology exams for early detection of glaucoma and other disorders of the eye. Is the patient up to date with their annual eye exam?  Yes  Who is the provider or what is the name of the office in which the patient attends annual eye exams? [facility]. If pt is not established with a provider, would they like to be referred to a provider to establish care? No .   Dental Screening: Recommended annual dental exams for proper oral hygiene  Community Resource Referral / Chronic Care Management: CRR required this visit?  No   CCM required this visit?  No      Plan:     I have personally reviewed and noted the following in the patient's chart:   Medical and social history Use of alcohol, tobacco or illicit drugs  Current medications and supplements including opioid prescriptions. Patient is not currently taking opioid prescriptions. Functional ability and status Nutritional status Physical activity Advanced directives List of other physicians Hospitalizations, surgeries, and ER visits in previous 12 months Vitals Screenings to include cognitive, depression, and falls Referrals and appointments  In addition, I have  reviewed and discussed with patient certain preventive protocols, quality metrics, and best practice recommendations. A written personalized care plan for preventive services as well as general preventive health recommendations were provided to patient.     Sheral Flow, LPN   30/09/4044   Nurse Notes: N/A

## 2022-11-02 ENCOUNTER — Other Ambulatory Visit: Payer: Self-pay | Admitting: Internal Medicine

## 2022-11-02 DIAGNOSIS — E559 Vitamin D deficiency, unspecified: Secondary | ICD-10-CM

## 2022-11-20 DIAGNOSIS — D1801 Hemangioma of skin and subcutaneous tissue: Secondary | ICD-10-CM | POA: Diagnosis not present

## 2022-11-20 DIAGNOSIS — L814 Other melanin hyperpigmentation: Secondary | ICD-10-CM | POA: Diagnosis not present

## 2022-11-20 DIAGNOSIS — L821 Other seborrheic keratosis: Secondary | ICD-10-CM | POA: Diagnosis not present

## 2022-11-20 DIAGNOSIS — D225 Melanocytic nevi of trunk: Secondary | ICD-10-CM | POA: Diagnosis not present

## 2022-11-20 DIAGNOSIS — L578 Other skin changes due to chronic exposure to nonionizing radiation: Secondary | ICD-10-CM | POA: Diagnosis not present

## 2022-11-20 DIAGNOSIS — Z85828 Personal history of other malignant neoplasm of skin: Secondary | ICD-10-CM | POA: Diagnosis not present

## 2022-11-27 NOTE — Progress Notes (Signed)
Jonathan Young Phone: [phone] Subjective:    I'm seeing this patient by the request  of:  Janith Lima, MD  CC: Ankle pain follow-up  OAC:[id number]  10/03/2022 Posterior tibialis tendinitis bilaterally.  Seems to be right greater than left.  Discussed with patient about recovery sandals, given exercises that I think will be helpful as well.  Discussed which activities to do which ones to avoid.  Discussed still using anti-inflammatories as needed.  Follow-up again in 2 to 3 months      Update 12/03/2022 Jonathan Young is a 71 y.o. male coming in with complaint of B ankle and R knee pain. Patient states that the is doing so well he almost cancelled appointment. Patient got the Ooofos as they have helped so much.       Past Medical History:  Diagnosis Date   Complication of anesthesia    sore throat from tube   Dysrhythmia    PVC'S   GERD (gastroesophageal reflux disease)    Hypercholesteremia    Osteoarthritis    Umbilical hernia    Past Surgical History:  Procedure Laterality Date   CARPAL TUNNEL RELEASE Left 05/23/2018   CARPAL TUNNEL RELEASE Right 11/22/2018   CERVICAL SPINE SURGERY     HEMORRHOID SURGERY  1982   INSERTION OF MESH N/A 07/27/2020   Procedure: INSERTION OF MESH;  Surgeon: Erroll Luna, MD;  Location: [facility];  Service: General;  Laterality: N/A;   SHOULDER SURGERY     BIL   TONSILLECTOMY AND ADENOIDECTOMY     TOTAL HIP ARTHROPLASTY     RT TOAL HIP   TOTAL HIP ARTHROPLASTY  06/01/2012   Procedure: TOTAL HIP ARTHROPLASTY ANTERIOR APPROACH;  Surgeon: Mauri Pole, MD;  Location: [facility];  Service: Orthopedics;  Laterality: Left;   UMBILICAL HERNIA REPAIR N/A 07/27/2020   Procedure: UMBILICAL HERNIA REPAIR WITH MESH;  Surgeon: Erroll Luna, MD;  Location: [facility];  Service: General;  Laterality: N/A;   Social History   Socioeconomic History    Marital status: Single    Spouse name: Not on file   Number of children: Not on file   Years of education: Not on file   Highest education level: Not on file  Occupational History   Not on file  Tobacco Use   Smoking status: Former    Types: Cigarettes    Quit date: 05/27/1981    Years since quitting: 41.5   Smokeless tobacco: Never  Vaping Use   Vaping Use: Never used  Substance and Sexual Activity   Alcohol use: Yes    Alcohol/week: 20.0 standard drinks of alcohol    Types: 20 Shots of liquor per week    Comment: wine nightly   Drug use: No   Sexual activity: Yes    Partners: Male  Other Topics Concern   Not on file  Social History Narrative   Not on file   Social Determinants of Health   Financial Resource Strain: Low Risk  (10/22/2022)   Overall Financial Resource Strain (CARDIA)    Difficulty of Paying Living Expenses: Not hard at all  Food Insecurity: No Food Insecurity (10/22/2022)   Hunger Vital Sign    Worried About Running Out of Food in the Last Year: Never true    Ran Out of Food in the Last Year: Never true  Transportation Needs: No Transportation Needs (10/22/2022)   PRAPARE - Transportation  Lack of Transportation (Medical): No    Lack of Transportation (Non-Medical): No  Physical Activity: Sufficiently Active (10/22/2022)   Exercise Vital Sign    Days of Exercise per Week: 4 days    Minutes of Exercise per Session: 40 min  Stress: Stress Concern Present (10/22/2022)   Bath    Feeling of Stress : To some extent  Social Connections: Unknown (10/22/2022)   Social Connection and Isolation Panel [NHANES]    Frequency of Communication with Friends and Family: Three times a week    Frequency of Social Gatherings with Friends and Family: Once a week    Attends Religious Services: Not on Advertising copywriter or Organizations: No    Attends Music therapist:  Patient refused    Marital Status: Living with partner   Allergies  Allergen Reactions   Vytorin [Ezetimibe-Simvastatin] Other (See Comments)    Muscle aches   Family History  Problem Relation Age of Onset   Emphysema Mother    Colon cancer Neg Hx    Esophageal cancer Neg Hx    Rectal cancer Neg Hx    Stomach cancer Neg Hx      Current Outpatient Medications (Cardiovascular):    rosuvastatin (CRESTOR) 10 MG tablet, TAKE 1 TABLET(10 MG) BY MOUTH DAILY   tadalafil (CIALIS) 20 MG tablet, Take 1 tablet (20 mg total) by mouth See admin instructions. Take 20 mg by mouth one hour prior to intercourse- maximum of 20 mg (1 tablet) in 24 hours   Current Outpatient Medications (Analgesics):    meloxicam (MOBIC) 15 MG tablet, TAKE 1 TABLET(15 MG) BY MOUTH DAILY   Current Outpatient Medications (Other):    pantoprazole (PROTONIX) 40 MG tablet, TAKE 1 TABLET(40 MG) BY MOUTH DAILY   Suvorexant (BELSOMRA) 15 MG TABS, Take 1 tablet by mouth at bedtime as needed.   valACYclovir (VALTREX) 1000 MG tablet, Take 0.5 tablets (500 mg total) by mouth daily.   Vitamin D, Ergocalciferol, (DRISDOL) 1.25 MG (50000 UNIT) CAPS capsule, TAKE 1 CAPSULE BY MOUTH EVERY SATURDAY    Objective  Blood pressure 122/84, pulse 64, height '6\' 1"'$  (1.854 m), SpO2 98 %.   General: No apparent distress alert and oriented x3 mood and affect normal, dressed appropriately.  HEENT: Pupils equal, extraocular movements intact  Respiratory: Patient's speak in full sentences and does not appear short of breath  Cardiovascular: No lower extremity edema, non tender, no erythema  Ankle exam bilaterally does show some mild overpronation noted.  Patient is very minorly tender to palpation.  Patient does have good range of motion though noted.  Neurovascular intact distally.    Impression and Recommendations:     The above documentation has been reviewed and is accurate and complete Lyndal Pulley, DO

## 2022-12-03 ENCOUNTER — Ambulatory Visit (INDEPENDENT_AMBULATORY_CARE_PROVIDER_SITE_OTHER): Payer: PPO | Admitting: Family Medicine

## 2022-12-03 VITALS — BP 122/84 | HR 64 | Ht 73.0 in

## 2022-12-03 DIAGNOSIS — M76821 Posterior tibial tendinitis, right leg: Secondary | ICD-10-CM

## 2022-12-03 DIAGNOSIS — M76822 Posterior tibial tendinitis, left leg: Secondary | ICD-10-CM | POA: Diagnosis not present

## 2022-12-03 NOTE — Assessment & Plan Note (Signed)
No longer having any significant pain.  Will follow-up as needed

## 2022-12-04 ENCOUNTER — Other Ambulatory Visit: Payer: Self-pay | Admitting: Internal Medicine

## 2022-12-04 DIAGNOSIS — K219 Gastro-esophageal reflux disease without esophagitis: Secondary | ICD-10-CM

## 2022-12-11 ENCOUNTER — Other Ambulatory Visit: Payer: Self-pay | Admitting: Internal Medicine

## 2022-12-11 DIAGNOSIS — M159 Polyosteoarthritis, unspecified: Secondary | ICD-10-CM

## 2022-12-18 ENCOUNTER — Other Ambulatory Visit: Payer: Self-pay | Admitting: Internal Medicine

## 2022-12-18 DIAGNOSIS — E559 Vitamin D deficiency, unspecified: Secondary | ICD-10-CM

## 2022-12-18 DIAGNOSIS — M159 Polyosteoarthritis, unspecified: Secondary | ICD-10-CM

## 2022-12-24 ENCOUNTER — Other Ambulatory Visit: Payer: Self-pay | Admitting: Internal Medicine

## 2022-12-24 DIAGNOSIS — M159 Polyosteoarthritis, unspecified: Secondary | ICD-10-CM

## 2022-12-24 DIAGNOSIS — E559 Vitamin D deficiency, unspecified: Secondary | ICD-10-CM

## 2022-12-26 ENCOUNTER — Encounter: Payer: Self-pay | Admitting: Internal Medicine

## 2023-01-01 ENCOUNTER — Encounter: Payer: Self-pay | Admitting: Internal Medicine

## 2023-01-01 ENCOUNTER — Ambulatory Visit (INDEPENDENT_AMBULATORY_CARE_PROVIDER_SITE_OTHER): Payer: PPO | Admitting: Internal Medicine

## 2023-01-01 VITALS — BP 138/86 | HR 78 | Temp 97.6°F | Resp 16 | Ht 73.0 in | Wt 197.0 lb

## 2023-01-01 DIAGNOSIS — M150 Primary generalized (osteo)arthritis: Secondary | ICD-10-CM

## 2023-01-01 DIAGNOSIS — M159 Polyosteoarthritis, unspecified: Secondary | ICD-10-CM | POA: Diagnosis not present

## 2023-01-01 DIAGNOSIS — N2889 Other specified disorders of kidney and ureter: Secondary | ICD-10-CM

## 2023-01-01 DIAGNOSIS — E785 Hyperlipidemia, unspecified: Secondary | ICD-10-CM | POA: Diagnosis not present

## 2023-01-01 DIAGNOSIS — Z23 Encounter for immunization: Secondary | ICD-10-CM | POA: Diagnosis not present

## 2023-01-01 DIAGNOSIS — K760 Fatty (change of) liver, not elsewhere classified: Secondary | ICD-10-CM | POA: Diagnosis not present

## 2023-01-01 DIAGNOSIS — I10 Essential (primary) hypertension: Secondary | ICD-10-CM | POA: Diagnosis not present

## 2023-01-01 DIAGNOSIS — N183 Chronic kidney disease, stage 3 unspecified: Secondary | ICD-10-CM | POA: Diagnosis not present

## 2023-01-01 DIAGNOSIS — E559 Vitamin D deficiency, unspecified: Secondary | ICD-10-CM

## 2023-01-01 DIAGNOSIS — E538 Deficiency of other specified B group vitamins: Secondary | ICD-10-CM

## 2023-01-01 LAB — CBC WITH DIFFERENTIAL/PLATELET
Basophils Absolute: 0 10*3/uL (ref 0.0–0.1)
Basophils Relative: 0.7 % (ref 0.0–3.0)
Eosinophils Absolute: 0.2 10*3/uL (ref 0.0–0.7)
Eosinophils Relative: 3.4 % (ref 0.0–5.0)
HCT: 46.1 % (ref 39.0–52.0)
Hemoglobin: 15.4 g/dL (ref 13.0–17.0)
Lymphocytes Relative: 38.3 % (ref 12.0–46.0)
Lymphs Abs: 2.4 10*3/uL (ref 0.7–4.0)
MCHC: 33.5 g/dL (ref 30.0–36.0)
MCV: 96.7 fl (ref 78.0–100.0)
Monocytes Absolute: 0.6 10*3/uL (ref 0.1–1.0)
Monocytes Relative: 10.2 % (ref 3.0–12.0)
Neutro Abs: 2.9 10*3/uL (ref 1.4–7.7)
Neutrophils Relative %: 47.4 % (ref 43.0–77.0)
Platelets: 234 10*3/uL (ref 150.0–400.0)
RBC: 4.77 Mil/uL (ref 4.22–5.81)
RDW: 12.9 % (ref 11.5–15.5)
WBC: 6.2 10*3/uL (ref 4.0–10.5)

## 2023-01-01 LAB — BASIC METABOLIC PANEL
BUN: 21 mg/dL (ref 6–23)
CO2: 28 mEq/L (ref 19–32)
Calcium: 9.8 mg/dL (ref 8.4–10.5)
Chloride: 105 mEq/L (ref 96–112)
Creatinine, Ser: 1.27 mg/dL (ref 0.40–1.50)
GFR: 56.64 mL/min — ABNORMAL LOW (ref >60.00)
Glucose, Bld: 98 mg/dL (ref 70–99)
Potassium: 5.2 mEq/L — ABNORMAL HIGH (ref 3.5–5.1)
Sodium: 142 mEq/L (ref 135–145)

## 2023-01-01 LAB — VITAMIN D 25 HYDROXY (VIT D DEFICIENCY, FRACTURES): VITD: 68.08 ng/mL (ref 30.00–100.00)

## 2023-01-01 LAB — URINALYSIS, ROUTINE W REFLEX MICROSCOPIC
Bilirubin Urine: NEGATIVE
Hgb urine dipstick: NEGATIVE
Ketones, ur: NEGATIVE
Leukocytes,Ua: NEGATIVE
Nitrite: NEGATIVE
RBC / HPF: NONE SEEN (ref 0–?)
Specific Gravity, Urine: 1.02 (ref 1.000–1.030)
Total Protein, Urine: NEGATIVE
Urine Glucose: NEGATIVE
Urobilinogen, UA: 0.2 (ref 0.0–1.0)
pH: 6 (ref 5.0–8.0)

## 2023-01-01 LAB — HEPATIC FUNCTION PANEL
ALT: 26 U/L (ref 0–53)
AST: 31 U/L (ref 0–37)
Albumin: 4.8 g/dL (ref 3.5–5.2)
Alkaline Phosphatase: 58 U/L (ref 39–117)
Bilirubin, Direct: 0.1 mg/dL (ref 0.0–0.3)
Total Bilirubin: 0.5 mg/dL (ref 0.2–1.2)
Total Protein: 7.8 g/dL (ref 6.0–8.3)

## 2023-01-01 LAB — FOLATE: Folate: >23.8 ng/mL (ref >5.9)

## 2023-01-01 LAB — VITAMIN B12: Vitamin B-12: 683 pg/mL (ref 211–911)

## 2023-01-01 MED ORDER — ROSUVASTATIN CALCIUM 10 MG PO TABS: 10.0000 mg | ORAL_TABLET | Freq: Every day | ORAL | 1 refills | Status: DC

## 2023-01-01 NOTE — Patient Instructions (Signed)
Hypertension, Adult High blood pressure (hypertension) is when the force of blood pumping through the arteries is too strong. The arteries are the blood vessels that carry blood from the heart throughout the body. Hypertension forces the heart to work harder to pump blood and may cause arteries to become narrow or stiff. Untreated or uncontrolled hypertension can lead to a heart attack, heart failure, a stroke, kidney disease, and other problems. A blood pressure reading consists of a higher number over a lower number. Ideally, your blood pressure should be below 120/80. The first ("top") number is called the systolic pressure. It is a measure of the pressure in your arteries as your heart beats. The second ("bottom") number is called the diastolic pressure. It is a measure of the pressure in your arteries as the heart relaxes. What are the causes? The exact cause of this condition is not known. There are some conditions that result in high blood pressure. What increases the risk? Certain factors may make you more likely to develop high blood pressure. Some of these risk factors are under your control, including: Smoking. Not getting enough exercise or physical activity. Being overweight. Having too much fat, sugar, calories, or salt (sodium) in your diet. Drinking too much alcohol. Other risk factors include: Having a personal history of heart disease, diabetes, high cholesterol, or kidney disease. Stress. Having a family history of high blood pressure and high cholesterol. Having obstructive sleep apnea. Age. The risk increases with age. What are the signs or symptoms? High blood pressure may not cause symptoms. Very high blood pressure (hypertensive crisis) may cause: Headache. Fast or irregular heartbeats (palpitations). Shortness of breath. Nosebleed. Nausea and vomiting. Vision changes. Severe chest pain, dizziness, and seizures. How is this diagnosed? This condition is diagnosed by  measuring your blood pressure while you are seated, with your arm resting on a flat surface, your legs uncrossed, and your feet flat on the floor. The cuff of the blood pressure monitor will be placed directly against the skin of your upper arm at the level of your heart. Blood pressure should be measured at least twice using the same arm. Certain conditions can cause a difference in blood pressure between your right and left arms. If you have a high blood pressure reading during one visit or you have normal blood pressure with other risk factors, you may be asked to: Return on a different day to have your blood pressure checked again. Monitor your blood pressure at home for 1 week or longer. If you are diagnosed with hypertension, you may have other blood or imaging tests to help your health care provider understand your overall risk for other conditions. How is this treated? This condition is treated by making healthy lifestyle changes, such as eating healthy foods, exercising more, and reducing your alcohol intake. You may be referred for counseling on a healthy diet and physical activity. Your health care provider may prescribe medicine if lifestyle changes are not enough to get your blood pressure under control and if: Your systolic blood pressure is above 130. Your diastolic blood pressure is above 80. Your personal target blood pressure may vary depending on your medical conditions, your age, and other factors. Follow these instructions at home: Eating and drinking  Eat a diet that is high in fiber and potassium, and low in sodium, added sugar, and fat. An example of this eating plan is called the DASH diet. DASH stands for Dietary Approaches to Stop Hypertension. To eat this way: Eat   plenty of fresh fruits and vegetables. Try to fill one half of your plate at each meal with fruits and vegetables. Eat whole grains, such as whole-wheat pasta, brown rice, or whole-grain bread. Fill about one  fourth of your plate with whole grains. Eat or drink low-fat dairy products, such as skim milk or low-fat yogurt. Avoid fatty cuts of meat, processed or cured meats, and poultry with skin. Fill about one fourth of your plate with lean proteins, such as fish, chicken without skin, beans, eggs, or tofu. Avoid pre-made and processed foods. These tend to be higher in sodium, added sugar, and fat. Reduce your daily sodium intake. Many people with hypertension should eat less than 1,500 mg of sodium a day. Do not drink alcohol if: Your health care provider tells you not to drink. You are pregnant, may be pregnant, or are planning to become pregnant. If you drink alcohol: Limit how much you have to: 0-1 drink a day for women. 0-2 drinks a day for men. Know how much alcohol is in your drink. In the [place] one drink equals one 12 oz bottle of beer (355 mL), one 5 oz glass of wine (148 mL), or one 1 oz glass of hard liquor (44 mL). Lifestyle  Work with your health care provider to maintain a healthy body weight or to lose weight. Ask what an ideal weight is for you. Get at least 30 minutes of exercise that causes your heart to beat faster (aerobic exercise) most days of the week. Activities may include walking, swimming, or biking. Include exercise to strengthen your muscles (resistance exercise), such as Pilates or lifting weights, as part of your weekly exercise routine. Try to do these types of exercises for 30 minutes at least 3 days a week. Do not use any products that contain nicotine or tobacco. These products include cigarettes, chewing tobacco, and vaping devices, such as e-cigarettes. If you need help quitting, ask your health care provider. Monitor your blood pressure at home as told by your health care provider. Keep all follow-up visits. This is important. Medicines Take over-the-counter and prescription medicines only as told by your health care provider. Follow directions carefully. Blood  pressure medicines must be taken as prescribed. Do not skip doses of blood pressure medicine. Doing this puts you at risk for problems and can make the medicine less effective. Ask your health care provider about side effects or reactions to medicines that you should watch for. Contact a health care provider if you: Think you are having a reaction to a medicine you are taking. Have headaches that keep coming back (recurring). Feel dizzy. Have swelling in your ankles. Have trouble with your vision. Get help right away if you: Develop a severe headache or confusion. Have unusual weakness or numbness. Feel faint. Have severe pain in your chest or abdomen. Vomit repeatedly. Have trouble breathing. These symptoms may be an emergency. Get help right away. Call 911. Do not wait to see if the symptoms will go away. Do not drive yourself to the hospital. Summary Hypertension is when the force of blood pumping through your arteries is too strong. If this condition is not controlled, it may put you at risk for serious complications. Your personal target blood pressure may vary depending on your medical conditions, your age, and other factors. For most people, a normal blood pressure is less than 120/80. Hypertension is treated with lifestyle changes, medicines, or a combination of both. Lifestyle changes include losing weight, eating a healthy,   low-sodium diet, exercising more, and limiting alcohol. This information is not intended to replace advice given to you by your health care provider. Make sure you discuss any questions you have with your health care provider. Document Revised: 10/16/2021 Document Reviewed: 10/16/2021 Elsevier Patient Education  2023 Elsevier Inc.  

## 2023-01-01 NOTE — Progress Notes (Unsigned)
Subjective:  Patient ID: Jonathan Young, male    DOB: 1951/07/26  Age: 72 y.o. MRN: [id number]  CC: Hypertension and Hyperlipidemia   HPI Jonathan Young presents for f/up -  He is active and denies DOE, CP, SOB, edema.  Outpatient Medications Prior to Visit  Medication Sig Dispense Refill   pantoprazole (PROTONIX) 40 MG tablet TAKE 1 TABLET(40 MG) BY MOUTH DAILY 90 tablet 0   Suvorexant (BELSOMRA) 15 MG TABS Take 1 tablet by mouth at bedtime as needed. 90 tablet 0   tadalafil (CIALIS) 20 MG tablet Take 1 tablet (20 mg total) by mouth See admin instructions. Take 20 mg by mouth one hour prior to intercourse- maximum of 20 mg (1 tablet) in 24 hours 20 tablet 2   valACYclovir (VALTREX) 1000 MG tablet Take 0.5 tablets (500 mg total) by mouth daily. 30 tablet 0   meloxicam (MOBIC) 15 MG tablet TAKE 1 TABLET(15 MG) BY MOUTH DAILY 90 tablet 0   rosuvastatin (CRESTOR) 10 MG tablet TAKE 1 TABLET(10 MG) BY MOUTH DAILY 90 tablet 1   Vitamin D, Ergocalciferol, (DRISDOL) 1.25 MG (50000 UNIT) CAPS capsule TAKE 1 CAPSULE BY MOUTH EVERY SATURDAY 4 capsule 0   No facility-administered medications prior to visit.    ROS Review of Systems  Constitutional:  Positive for fatigue. Negative for diaphoresis.  HENT: Negative.    Eyes: Negative.   Respiratory:  Positive for cough. Negative for chest tightness, shortness of breath and wheezing.   Cardiovascular:  Negative for chest pain, palpitations and leg swelling.  Gastrointestinal:  Negative for abdominal pain, diarrhea, nausea and vomiting.  Genitourinary:  Negative for difficulty urinating and dysuria.  Musculoskeletal:  Positive for arthralgias. Negative for joint swelling and myalgias.  Skin: Negative.  Negative for color change.  Neurological: Negative.  Negative for dizziness, weakness and numbness.  Psychiatric/Behavioral:  Positive for sleep disturbance. The patient is not nervous/anxious.     Objective:  BP 138/86 (BP Location: Right Arm,  Patient Position: Sitting, Cuff Size: Large)   Pulse 78   Temp 97.6 F (36.4 C) (Oral)   Resp 16   Ht '6\' 1"'$  (1.854 m)   Wt 197 lb (89.4 kg)   SpO2 95%   BMI 25.99 kg/m   BP Readings from Last 3 Encounters:  01/01/23 138/86  12/03/22 122/84  10/17/22 (!) 153/77    Wt Readings from Last 3 Encounters:  01/01/23 197 lb (89.4 kg)  10/03/22 200 lb (90.7 kg)  08/19/22 192 lb (87.1 kg)    Physical Exam Vitals reviewed.  HENT:     Nose: Nose normal.     Mouth/Throat:     Mouth: Mucous membranes are moist.  Eyes:     General: No scleral icterus.    Conjunctiva/sclera: Conjunctivae normal.  Cardiovascular:     Rate and Rhythm: Normal rate and regular rhythm.     Pulses: Normal pulses.     Heart sounds: No murmur heard.    No friction rub. No gallop.     Comments: EKG- NSR, 62 bpm Incomplete RBBB is old No LVH or Q waves Pulmonary:     Effort: Pulmonary effort is normal.     Breath sounds: No stridor. No wheezing, rhonchi or rales.  Abdominal:     General: Abdomen is flat.     Palpations: There is no mass.     Tenderness: There is no abdominal tenderness. There is no guarding.     Hernia: No hernia is present.  Musculoskeletal:        General: No swelling.     Cervical back: Neck supple.     Right lower leg: No edema.     Left lower leg: No edema.  Skin:    General: Skin is warm and dry.  Neurological:     General: No focal deficit present.     Mental Status: He is alert.  Psychiatric:        Mood and Affect: Mood normal.        Behavior: Behavior normal.     Lab Results  Component Value Date   WBC 6.2 01/01/2023   HGB 15.4 01/01/2023   HCT 46.1 01/01/2023   PLT 234.0 01/01/2023   GLUCOSE 98 01/01/2023   CHOL 139 04/26/2022   TRIG 82.0 04/26/2022   HDL 43.80 04/26/2022   LDLDIRECT 87.0 04/03/2016   LDLCALC 79 04/26/2022   ALT 26 01/01/2023   AST 31 01/01/2023   NA 142 01/01/2023   K 5.2 No hemolysis seen (H) 01/01/2023   CL 105 01/01/2023    CREATININE 1.27 01/01/2023   BUN 21 01/01/2023   CO2 28 01/01/2023   TSH 1.30 04/26/2022   PSA 6.0 12/31/2021   INR 1.0 04/18/2021   HGBA1C 5.8 04/03/2016    DG Chest 2 View  Result Date: 10/17/2022 CLINICAL DATA:  Cough, chest tightness, short of breath EXAM: CHEST - 2 VIEW COMPARISON:  07/08/2017 FINDINGS: Frontal and lateral views of the chest demonstrate an unremarkable cardiac silhouette. No airspace disease, effusion, or pneumothorax. No acute bony abnormality. IMPRESSION: 1. No acute intrathoracic process. Electronically Signed   By: Randa Ngo M.D.   On: 10/17/2022 15:48    Assessment & Plan:   Jonathan Young was seen today for hypertension and hyperlipidemia.  Diagnoses and all orders for this visit:  Essential hypertension- His BP is well controlled. -     EKG 12-Lead  CRI (chronic renal insufficiency), stage 3 (moderate) (HCC)- K+ is mildly elevated. Will d'c the nsaid. -     CBC with Differential/Platelet; Future -     Basic metabolic panel; Future -     Urinalysis, Routine w reflex microscopic; Future -     Urinalysis, Routine w reflex microscopic -     Basic metabolic panel -     CBC with Differential/Platelet  Vitamin D deficiency -     VITAMIN D 25 Hydroxy (Vit-D Deficiency, Fractures); Future -     VITAMIN D 25 Hydroxy (Vit-D Deficiency, Fractures) -     Vitamin D, Ergocalciferol, (DRISDOL) 1.25 MG (50000 UNIT) CAPS capsule; Take 1 capsule (50,000 Units total) by mouth every 7 (seven) days.  Primary osteoarthritis involving multiple joints- will start turmeric  Hyperlipidemia with target LDL less than 130 -     rosuvastatin (CRESTOR) 10 MG tablet; Take 1 tablet (10 mg total) by mouth daily. -     Hepatic function panel; Future -     Hepatic function panel  Vitamin B 12 deficiency- H/H, B12, folate are normal. -     Vitamin B12; Future -     Folate; Future -     Folate -     Vitamin B12  Fatty infiltration of liver- LFTs are normal. -     Hepatic function  panel; Future -     Hepatic function panel  Other orders -     Pneumococcal polysaccharide vaccine 23-valent greater than or equal to 2yo subcutaneous/IM   I have discontinued Linwood P. Zinni's meloxicam.  I have also changed his rosuvastatin and Vitamin D (Ergocalciferol). Additionally, I am having him maintain his valACYclovir, tadalafil, Belsomra, and pantoprazole.  Meds ordered this encounter  Medications   rosuvastatin (CRESTOR) 10 MG tablet    Sig: Take 1 tablet (10 mg total) by mouth daily.    Dispense:  90 tablet    Refill:  1   Vitamin D, Ergocalciferol, (DRISDOL) 1.25 MG (50000 UNIT) CAPS capsule    Sig: Take 1 capsule (50,000 Units total) by mouth every 7 (seven) days.    Dispense:  12 capsule    Refill:  0     Follow-up: Return in about 6 months (around 07/02/2023).  Scarlette Calico, MD

## 2023-01-02 ENCOUNTER — Encounter: Payer: Self-pay | Admitting: Internal Medicine

## 2023-01-02 MED ORDER — VITAMIN D (ERGOCALCIFEROL) 1.25 MG (50000 UNIT) PO CAPS: 50000.0000 [IU] | ORAL_CAPSULE | ORAL | 0 refills | Status: DC

## 2023-02-04 DIAGNOSIS — R972 Elevated prostate specific antigen [PSA]: Secondary | ICD-10-CM | POA: Diagnosis not present

## 2023-02-04 LAB — PSA: PSA: 5.6

## 2023-02-05 ENCOUNTER — Ambulatory Visit (HOSPITAL_COMMUNITY)
Admission: RE | Admit: 2023-02-05 | Discharge: 2023-02-05 | Disposition: A | Discharge status: Home / Self Care | Payer: PPO | Source: Ambulatory Visit | Attending: Family Medicine | Admitting: Family Medicine

## 2023-02-05 ENCOUNTER — Encounter (HOSPITAL_COMMUNITY): Payer: Self-pay

## 2023-02-05 VITALS — BP 135/83 | HR 76 | Temp 97.5°F | Resp 16

## 2023-02-05 DIAGNOSIS — S80812A Abrasion, left lower leg, initial encounter: Secondary | ICD-10-CM | POA: Diagnosis not present

## 2023-02-05 DIAGNOSIS — L089 Local infection of the skin and subcutaneous tissue, unspecified: Secondary | ICD-10-CM

## 2023-02-05 MED ORDER — CEPHALEXIN 250 MG PO CAPS: 250.0000 mg | ORAL_CAPSULE | Freq: Three times a day (TID) | ORAL | 0 refills | Status: AC

## 2023-02-05 MED ORDER — MUPIROCIN 2 % EX OINT: 1.0000 | TOPICAL_OINTMENT | Freq: Two times a day (BID) | CUTANEOUS | 0 refills | Status: DC

## 2023-02-05 NOTE — ED Provider Notes (Addendum)
[place]    CSN: [id number] Arrival date & time: 02/05/23  1345      History   Chief Complaint Chief Complaint  Patient presents with   Leg Injury    I fell a few days ago and it is not healing at all. - Entered by patient   Fall    HPI Jonathan Young is a 72 y.o. male.    Fall   Here for a wound on his left lower leg.  About 5-6 days ago, he fell and struck his left lower leg on a truck ramp.  He has been cleaning it with peroxide and keeping it bandaged.  It is becoming a little wetter on the wound and it is having more redness around.  No fever or chills.  He is not allergic to any antibiotics  Last tetanus was in 2017  Past Medical History:  Diagnosis Date   Complication of anesthesia    sore throat from tube   Dysrhythmia    PVC'S   GERD (gastroesophageal reflux disease)    Hypercholesteremia    Osteoarthritis    Umbilical hernia     Patient Active Problem List   Diagnosis Date Noted   Posterior tibialis tendinitis of both lower extremities 10/03/2022   Patellofemoral arthritis of right knee 08/19/2022   Psychophysiological insomnia 06/06/2020   Fatty infiltration of liver 03/02/2020   Nodule of lower lobe of right lung 02/14/2020   Maxillary sinusitis, chronic 06/29/2017   CRI (chronic renal insufficiency), stage 3 (moderate) ([place]) 04/07/2017   Pure hyperglyceridemia 04/03/2017   Essential hypertension 11/07/2016   GAD (generalized anxiety disorder) 10/17/2015   Erectile dysfunction of organic origin 10/10/2015   PSA elevation 01/10/2015   High risk homosexual behavior 12/29/2014   Routine general medical examination at a health care facility 12/29/2014   GERD (gastroesophageal reflux disease) 04/23/2011   IBS (irritable bowel syndrome) 04/23/2011   Vitamin D deficiency 01/16/2011   DUPUYTREN'S CONTRACTURE 01/16/2011   Hypogonadism male 07/25/2010   IDIOPATHIC OSTEOPOROSIS 11/01/2008   Vitamin B 12 deficiency 03/22/2008    Osteoarthritis 08/25/2007   Hyperlipidemia with target LDL less than 130 08/03/2007    Past Surgical History:  Procedure Laterality Date   CARPAL TUNNEL RELEASE Left 05/23/2018   CARPAL TUNNEL RELEASE Right 11/22/2018   CERVICAL SPINE SURGERY     HEMORRHOID SURGERY  1982   INSERTION OF MESH N/A 07/27/2020   Procedure: INSERTION OF MESH;  Surgeon: Erroll Luna, MD;  Location: [facility];  Service: General;  Laterality: N/A;   SHOULDER SURGERY     BIL   TONSILLECTOMY AND ADENOIDECTOMY     TOTAL HIP ARTHROPLASTY     RT TOAL HIP   TOTAL HIP ARTHROPLASTY  06/01/2012   Procedure: TOTAL HIP ARTHROPLASTY ANTERIOR APPROACH;  Surgeon: Mauri Pole, MD;  Location: [facility];  Service: Orthopedics;  Laterality: Left;   UMBILICAL HERNIA REPAIR N/A 07/27/2020   Procedure: UMBILICAL HERNIA REPAIR WITH MESH;  Surgeon: Erroll Luna, MD;  Location: [facility];  Service: General;  Laterality: N/A;       Home Medications    Prior to Admission medications   Medication Sig Start Date End Date Taking? Authorizing Provider  cephALEXin (KEFLEX) 250 MG capsule Take 1 capsule (250 mg total) by mouth 3 (three) times daily for 7 days. 02/05/23 02/12/23 Yes Barrett Henle, MD  mupirocin ointment (BACTROBAN) 2 % Apply 1 Application topically 2 (two) times daily. To affected area  till better 02/05/23  Yes Voula Waln, Gwenlyn Perking, MD  pantoprazole (PROTONIX) 40 MG tablet TAKE 1 TABLET(40 MG) BY MOUTH DAILY 12/04/22   Janith Lima, MD  rosuvastatin (CRESTOR) 10 MG tablet Take 1 tablet (10 mg total) by mouth daily. 01/01/23   Janith Lima, MD  Suvorexant (BELSOMRA) 15 MG TABS Take 1 tablet by mouth at bedtime as needed. 08/09/22   Janith Lima, MD  tadalafil (CIALIS) 20 MG tablet Take 1 tablet (20 mg total) by mouth See admin instructions. Take 20 mg by mouth one hour prior to intercourse- maximum of 20 mg (1 tablet) in 24 hours 04/29/22   Janith Lima, MD  valACYclovir (VALTREX)  1000 MG tablet Take 0.5 tablets (500 mg total) by mouth daily. 08/26/18   Janith Lima, MD  Vitamin D, Ergocalciferol, (DRISDOL) 1.25 MG (50000 UNIT) CAPS capsule Take 1 capsule (50,000 Units total) by mouth every 7 (seven) days. 01/02/23   Janith Lima, MD    Family History Family History  Problem Relation Age of Onset   Emphysema Mother    Colon cancer Neg Hx    Esophageal cancer Neg Hx    Rectal cancer Neg Hx    Stomach cancer Neg Hx     Social History Social History   Tobacco Use   Smoking status: Former    Types: Cigarettes    Quit date: 05/27/1981    Years since quitting: 41.7   Smokeless tobacco: Never  Vaping Use   Vaping Use: Never used  Substance Use Topics   Alcohol use: Yes    Alcohol/week: 20.0 standard drinks of alcohol    Types: 20 Shots of liquor per week    Comment: wine nightly   Drug use: No     Allergies   Vytorin [ezetimibe-simvastatin]   Review of Systems Review of Systems   Physical Exam Triage Vital Signs ED Triage Vitals  Enc Vitals Group     BP 02/05/23 1401 135/83     Pulse Rate 02/05/23 1401 76     Resp 02/05/23 1401 16     Temp 02/05/23 1401 (!) 97.5 F (36.4 C)     Temp Source 02/05/23 1401 Oral     SpO2 02/05/23 1401 95 %     Weight --      Height --      Head Circumference --      Peak Flow --      Pain Score 02/05/23 1403 2     Pain Loc --      Pain Edu? --      Excl. in [place]? --    No data found.  Updated Vital Signs BP 135/83 (BP Location: Left Arm)   Pulse 76   Temp (!) 97.5 F (36.4 C) (Oral)   Resp 16   SpO2 95%   Visual Acuity Right Eye Distance:   Left Eye Distance:   Bilateral Distance:    Right Eye Near:   Left Eye Near:    Bilateral Near:     Physical Exam Vitals reviewed.  Constitutional:      General: He is not in acute distress.    Appearance: He is not ill-appearing, toxic-appearing or diaphoretic.  Musculoskeletal:     Right lower leg: No edema.  Skin:    Coloration: Skin is not  pale.     Comments: There is an abrasion about 1 cm in diameter over his lower shin on the left leg.  The wound  bed is a little moist.  There is a halo of erythema and induration around it about a centimeter wide.  Also there is little edema in the lower leg and ankle distal to the wound.  Neurological:     General: No focal deficit present.     Mental Status: He is alert and oriented to person, place, and time.  Psychiatric:        Behavior: Behavior normal.      UC Treatments / Results  Labs (all labs ordered are listed, but only abnormal results are displayed) Labs Reviewed - No data to display  EKG   Radiology No results found.  Procedures Procedures (including critical care time)  Medications Ordered in UC Medications - No data to display  Initial Impression / Assessment and Plan / UC Course  I have reviewed the triage vital signs and the nursing notes.  Pertinent labs & imaging results that were available during my care of the patient were reviewed by me and considered in my medical decision making (see chart for details).       Keflex orally and Bactroban topically are sent in to treat the wound infection.  He is up-to-date on his tetanus shot Final Clinical Impressions(s) / UC Diagnoses   Final diagnoses:  Infected abrasion of left lower extremity, initial encounter     Discharge Instructions      Take cephalexin 250 mg--1 capsule 3 times daily for 7 days  Put mupirocin ointment on the sore areas twice daily until improved  Clean the wound 1-2 times daily as you are doing soapy water or peroxide, then put new antibiotic ointment on.     ED Prescriptions     Medication Sig Dispense Auth. Provider   cephALEXin (KEFLEX) 250 MG capsule Take 1 capsule (250 mg total) by mouth 3 (three) times daily for 7 days. 21 capsule Barrett Henle, MD   mupirocin ointment (BACTROBAN) 2 % Apply 1 Application topically 2 (two) times daily. To affected area till  better 22 g Windy Carina Gwenlyn Perking, MD      PDMP not reviewed this encounter.   Barrett Henle, MD 02/05/23 1412    Barrett Henle, MD 02/10/23 [phone]

## 2023-02-05 NOTE — Discharge Instructions (Signed)
Take cephalexin 250 mg--1 capsule 3 times daily for 7 days  Put mupirocin ointment on the sore areas twice daily until improved  Clean the wound 1-2 times daily as you are doing soapy water or peroxide, then put new antibiotic ointment on.

## 2023-02-05 NOTE — ED Triage Notes (Signed)
Patient reports that he ran into a truck ramp and fell 5-6 days ago. Patient has an unhealed wound to the left lower leg.  Patient states he has been using H2O2 and Neosporin

## 2023-02-06 DIAGNOSIS — N401 Enlarged prostate with lower urinary tract symptoms: Secondary | ICD-10-CM | POA: Diagnosis not present

## 2023-02-06 DIAGNOSIS — R3912 Poor urinary stream: Secondary | ICD-10-CM | POA: Diagnosis not present

## 2023-03-04 ENCOUNTER — Other Ambulatory Visit: Payer: Self-pay | Admitting: Internal Medicine

## 2023-03-04 DIAGNOSIS — K219 Gastro-esophageal reflux disease without esophagitis: Secondary | ICD-10-CM

## 2023-03-08 ENCOUNTER — Encounter: Payer: Self-pay | Admitting: Internal Medicine

## 2023-03-10 ENCOUNTER — Other Ambulatory Visit: Payer: Self-pay | Admitting: Internal Medicine

## 2023-03-10 DIAGNOSIS — A09 Infectious gastroenteritis and colitis, unspecified: Secondary | ICD-10-CM

## 2023-03-10 MED ORDER — AZITHROMYCIN 500 MG PO TABS: 500.0000 mg | ORAL_TABLET | Freq: Every day | ORAL | 0 refills | Status: DC

## 2023-03-24 ENCOUNTER — Other Ambulatory Visit: Payer: Self-pay | Admitting: Internal Medicine

## 2023-03-24 DIAGNOSIS — E559 Vitamin D deficiency, unspecified: Secondary | ICD-10-CM

## 2023-04-15 ENCOUNTER — Encounter (HOSPITAL_COMMUNITY): Payer: Self-pay

## 2023-04-15 ENCOUNTER — Ambulatory Visit (HOSPITAL_COMMUNITY)
Admission: RE | Admit: 2023-04-15 | Discharge: 2023-04-15 | Disposition: A | Discharge status: Home / Self Care | Payer: PPO | Source: Ambulatory Visit | Attending: Internal Medicine | Admitting: Internal Medicine

## 2023-04-15 VITALS — BP 152/95 | HR 73 | Temp 97.3°F | Resp 16

## 2023-04-15 DIAGNOSIS — Z23 Encounter for immunization: Secondary | ICD-10-CM

## 2023-04-15 DIAGNOSIS — M795 Residual foreign body in soft tissue: Secondary | ICD-10-CM

## 2023-04-15 DIAGNOSIS — S61239A Puncture wound without foreign body of unspecified finger without damage to nail, initial encounter: Secondary | ICD-10-CM | POA: Diagnosis not present

## 2023-04-15 MED ORDER — LIDOCAINE HCL 2 % IJ SOLN
INTRAMUSCULAR | Status: AC
  Filled 2023-04-15: qty 20

## 2023-04-15 MED ORDER — TETANUS-DIPHTH-ACELL PERTUSSIS 5-2.5-18.5 LF-MCG/0.5 IM SUSY
0.5000 mL | PREFILLED_SYRINGE | Freq: Once | INTRAMUSCULAR | Status: AC
  Administered 2023-04-15: 0.5 mL via INTRAMUSCULAR

## 2023-04-15 MED ORDER — TETANUS-DIPHTH-ACELL PERTUSSIS 5-2.5-18.5 LF-MCG/0.5 IM SUSY
PREFILLED_SYRINGE | INTRAMUSCULAR | Status: AC
  Filled 2023-04-15: qty 0.5

## 2023-04-15 NOTE — ED Triage Notes (Signed)
Pt states he has a splinter in his right thumb for the past 2 hours and he can't get it out.

## 2023-04-15 NOTE — ED Provider Notes (Signed)
[facility]    CSN: [id number] Arrival date & time: 04/15/23  1224      History   Chief Complaint Chief Complaint  Patient presents with   Finger Injury    I have a deep splinter and can not remove it. - Entered by patient    HPI Jonathan Young is a 72 y.o. male.   Patient presents to urgent care for evaluation of possible foreign body to the soft tissue at the distal pad of the right thumb.  He states he was trimming a bush at home and was not wearing gloves when doing so.  He accidentally got a piece of possible barb wire or a thorn to the finger. He attempted to remove the suspected splinter/barb wire from the finger with tweezers but was unsuccessful. Injury happened 2-3 hours ago. Denies numbness/tingling distal to injury. Top of foreign body is visible to the pad of the right thumb. Last tetanus injection was within the last 10 years but not within the last 5.      Past Medical History:  Diagnosis Date   Complication of anesthesia    sore throat from tube   Dysrhythmia    PVC'S   GERD (gastroesophageal reflux disease)    Hypercholesteremia    Osteoarthritis    Umbilical hernia     Patient Active Problem List   Diagnosis Date Noted   Posterior tibialis tendinitis of both lower extremities 10/03/2022   Patellofemoral arthritis of right knee 08/19/2022   Psychophysiological insomnia 06/06/2020   Fatty infiltration of liver 03/02/2020   Nodule of lower lobe of right lung 02/14/2020   Maxillary sinusitis, chronic 06/29/2017   CRI (chronic renal insufficiency), stage 3 (moderate) 04/07/2017   Pure hyperglyceridemia 04/03/2017   Essential hypertension 11/07/2016   GAD (generalized anxiety disorder) 10/17/2015   Erectile dysfunction of organic origin 10/10/2015   PSA elevation 01/10/2015   High risk homosexual behavior 12/29/2014   Routine general medical examination at a health care facility 12/29/2014   GERD (gastroesophageal reflux disease)  04/23/2011   IBS (irritable bowel syndrome) 04/23/2011   Vitamin D deficiency 01/16/2011   DUPUYTREN'S CONTRACTURE 01/16/2011   Hypogonadism male 07/25/2010   IDIOPATHIC OSTEOPOROSIS 11/01/2008   Vitamin B 12 deficiency 03/22/2008   Osteoarthritis 08/25/2007   Hyperlipidemia with target LDL less than 130 08/03/2007    Past Surgical History:  Procedure Laterality Date   CARPAL TUNNEL RELEASE Left 05/23/2018   CARPAL TUNNEL RELEASE Right 11/22/2018   CERVICAL SPINE SURGERY     HEMORRHOID SURGERY  1982   INSERTION OF MESH N/A 07/27/2020   Procedure: INSERTION OF MESH;  Surgeon: Harriette Bouillon, MD;  Location: [facility];  Service: General;  Laterality: N/A;   SHOULDER SURGERY     BIL   TONSILLECTOMY AND ADENOIDECTOMY     TOTAL HIP ARTHROPLASTY     RT TOAL HIP   TOTAL HIP ARTHROPLASTY  06/01/2012   Procedure: TOTAL HIP ARTHROPLASTY ANTERIOR APPROACH;  Surgeon: Shelda Pal, MD;  Location: [facility];  Service: Orthopedics;  Laterality: Left;   UMBILICAL HERNIA REPAIR N/A 07/27/2020   Procedure: UMBILICAL HERNIA REPAIR WITH MESH;  Surgeon: Harriette Bouillon, MD;  Location: [facility];  Service: General;  Laterality: N/A;       Home Medications    Prior to Admission medications   Medication Sig Start Date End Date Taking? Authorizing Provider  azithromycin (ZITHROMAX) 500 MG tablet Take 1 tablet (500 mg total) by  mouth daily. 03/10/23   Etta Grandchild, MD  mupirocin ointment (BACTROBAN) 2 % Apply 1 Application topically 2 (two) times daily. To affected area till better 02/05/23   Zenia Resides, MD  pantoprazole (PROTONIX) 40 MG tablet TAKE 1 TABLET(40 MG) BY MOUTH DAILY 03/04/23   Etta Grandchild, MD  rosuvastatin (CRESTOR) 10 MG tablet Take 1 tablet (10 mg total) by mouth daily. 01/01/23   Etta Grandchild, MD  Suvorexant (BELSOMRA) 15 MG TABS Take 1 tablet by mouth at bedtime as needed. 08/09/22   Etta Grandchild, MD  tadalafil (CIALIS) 20 MG tablet Take 1  tablet (20 mg total) by mouth See admin instructions. Take 20 mg by mouth one hour prior to intercourse- maximum of 20 mg (1 tablet) in 24 hours 04/29/22   Etta Grandchild, MD  valACYclovir (VALTREX) 1000 MG tablet Take 0.5 tablets (500 mg total) by mouth daily. 08/26/18   Etta Grandchild, MD  Vitamin D, Ergocalciferol, (DRISDOL) 1.25 MG (50000 UNIT) CAPS capsule TAKE 1 CAPSULE BY MOUTH EVERY 7 DAYS 03/24/23   Etta Grandchild, MD    Family History Family History  Problem Relation Age of Onset   Emphysema Mother    Colon cancer Neg Hx    Esophageal cancer Neg Hx    Rectal cancer Neg Hx    Stomach cancer Neg Hx     Social History Social History   Tobacco Use   Smoking status: Former    Types: Cigarettes    Quit date: 05/27/1981    Years since quitting: 41.9   Smokeless tobacco: Never  Vaping Use   Vaping Use: Never used  Substance Use Topics   Alcohol use: Yes    Alcohol/week: 20.0 standard drinks of alcohol    Types: 20 Shots of liquor per week    Comment: wine nightly   Drug use: No     Allergies   Vytorin [ezetimibe-simvastatin]   Review of Systems Review of Systems Per HPI  Physical Exam Triage Vital Signs ED Triage Vitals [04/15/23 1236]  Enc Vitals Group     BP (!) 152/95     Pulse Rate 73     Resp 16     Temp (!) 97.3 F (36.3 C)     Temp Source Oral     SpO2 97 %     Weight      Height      Head Circumference      Peak Flow      Pain Score 1     Pain Loc      Pain Edu?      Excl. in [place]?    No data found.  Updated Vital Signs BP (!) 152/95 (BP Location: Right Arm)   Pulse 73   Temp (!) 97.3 F (36.3 C) (Oral)   Resp 16   SpO2 97%   Visual Acuity Right Eye Distance:   Left Eye Distance:   Bilateral Distance:    Right Eye Near:   Left Eye Near:    Bilateral Near:     Physical Exam Vitals and nursing note reviewed.  Constitutional:      Appearance: He is not ill-appearing or toxic-appearing.  HENT:     Head: Normocephalic and  atraumatic.     Right Ear: Hearing and external ear normal.     Left Ear: Hearing and external ear normal.     Nose: Nose normal.     Mouth/Throat:  Lips: Pink.  Eyes:     General: Lids are normal. Vision grossly intact. Gaze aligned appropriately.     Extraocular Movements: Extraocular movements intact.     Conjunctiva/sclera: Conjunctivae normal.  Pulmonary:     Effort: Pulmonary effort is normal.  Musculoskeletal:     Cervical back: Neck supple.  Skin:    General: Skin is warm and dry.     Capillary Refill: Capillary refill takes less than 2 seconds.     Findings: Signs of injury and wound present. No rash.     Comments: Pinpoint black spot to the pad of the right thumb indicating likely foreign body to the soft-tissue of the skin. See images below for further detail.   Neurological:     General: No focal deficit present.     Mental Status: He is alert and oriented to person, place, and time. Mental status is at baseline.     Cranial Nerves: No dysarthria or facial asymmetry.  Psychiatric:        Mood and Affect: Mood normal.        Speech: Speech normal.        Behavior: Behavior normal.        Thought Content: Thought content normal.        Judgment: Judgment normal.         UC Treatments / Results  Labs (all labs ordered are listed, but only abnormal results are displayed) Labs Reviewed - No data to display  EKG   Radiology No results found.  Procedures Foreign Body Removal  Date/Time: 04/15/2023 2:16 PM  Performed by: Carlisle Beers, FNP Authorized by: Carlisle Beers, FNP   Consent:    Consent obtained:  Verbal   Consent given by:  Patient   Risks, benefits, and alternatives were discussed: yes     Risks discussed:  Bleeding, incomplete removal, infection, nerve damage, pain, poor cosmetic result and worsening of condition   Alternatives discussed:  No treatment Universal protocol:    Patient identity confirmed:  Verbally with  patient Location:    Location:  Finger   Finger location:  R thumb   Depth:  Subcutaneous   Tendon involvement:  None Pre-procedure details:    Imaging:  None   Neurovascular status: intact   Anesthesia:    Anesthesia method:  Local infiltration   Local anesthetic:  Lidocaine 1% w/o epi Procedure type:    Procedure complexity:  Simple Procedure details:    Incision length:  2mm   Localization method:  Visualized   Dissection of underlying tissues: no     Foreign bodies recovered:  1   Description:  Black thorn, see image below   Intact foreign body removal: yes   Post-procedure details:    Neurovascular status: intact     Confirmation:  No additional foreign bodies on visualization   Skin closure:  None   Dressing:  Non-adherent dressing   Procedure completion:  Tolerated well, no immediate complications  (including critical care time)  Medications Ordered in UC Medications  Tdap (BOOSTRIX) injection 0.5 mL (0.5 mLs Intramuscular Given 04/15/23 1351)    Initial Impression / Assessment and Plan / UC Course  I have reviewed the triage vital signs and the nursing notes.  Pertinent labs & imaging results that were available during my care of the patient were reviewed by me and considered in my medical decision making (see chart for details).   1. Puncture wound of finger of right hand, foreign body  in soft tissue Removed foreign body from soft tissue of the pad of the right thumb, see procedure note above. Tdap injection updated today. Advised to watch for signs of infection. Wound will heal on its own in the next few days. No need for prophylactic antibiotics. Neurovascularly intact distally to injury.   Discussed physical exam and available lab work findings in clinic with patient.  Counseled patient regarding appropriate use of medications and potential side effects for all medications recommended or prescribed today. Discussed red flag signs and symptoms of worsening  condition,when to call the PCP office, return to urgent care, and when to seek higher level of care in the emergency department. Patient verbalizes understanding and agreement with plan. All questions answered. Patient discharged in stable condition.    Final Clinical Impressions(s) / UC Diagnoses   Final diagnoses:  Puncture wound of finger of right hand, initial encounter  Foreign body (FB) in soft tissue     Discharge Instructions      We removed the thorn from your finger today.   Monitor the site for signs of infection such as redness, swelling, pus drainage, or pain.  Wear gloves when gardening! :)   If you develop any new or worsening symptoms or do not improve in the next 2 to 3 days, please return.  If your symptoms are severe, please go to the emergency room.  Follow-up with your primary care provider for further evaluation and management of your symptoms as well as ongoing wellness visits.  I hope you feel better!    ED Prescriptions   None    PDMP not reviewed this encounter.   Carlisle Beers, [place] 04/15/23 1420

## 2023-04-15 NOTE — Discharge Instructions (Addendum)
We removed the thorn from your finger today.   Monitor the site for signs of infection such as redness, swelling, pus drainage, or pain.  Wear gloves when gardening! :)   If you develop any new or worsening symptoms or do not improve in the next 2 to 3 days, please return.  If your symptoms are severe, please go to the emergency room.  Follow-up with your primary care provider for further evaluation and management of your symptoms as well as ongoing wellness visits.  I hope you feel better!

## 2023-05-02 ENCOUNTER — Encounter: Payer: Self-pay | Admitting: Internal Medicine

## 2023-05-08 ENCOUNTER — Ambulatory Visit (INDEPENDENT_AMBULATORY_CARE_PROVIDER_SITE_OTHER): Payer: PPO | Admitting: Internal Medicine

## 2023-05-08 VITALS — BP 132/78 | HR 90 | Temp 98.0°F | Resp 16 | Ht 73.0 in | Wt 193.0 lb

## 2023-05-08 DIAGNOSIS — K219 Gastro-esophageal reflux disease without esophagitis: Secondary | ICD-10-CM

## 2023-05-08 DIAGNOSIS — M818 Other osteoporosis without current pathological fracture: Secondary | ICD-10-CM

## 2023-05-08 DIAGNOSIS — E291 Testicular hypofunction: Secondary | ICD-10-CM

## 2023-05-08 DIAGNOSIS — E559 Vitamin D deficiency, unspecified: Secondary | ICD-10-CM

## 2023-05-08 DIAGNOSIS — R9431 Abnormal electrocardiogram [ECG] [EKG]: Secondary | ICD-10-CM

## 2023-05-08 DIAGNOSIS — Z0001 Encounter for general adult medical examination with abnormal findings: Secondary | ICD-10-CM | POA: Diagnosis not present

## 2023-05-08 DIAGNOSIS — N183 Chronic kidney disease, stage 3 unspecified: Secondary | ICD-10-CM | POA: Diagnosis not present

## 2023-05-08 DIAGNOSIS — R5382 Chronic fatigue, unspecified: Secondary | ICD-10-CM | POA: Diagnosis not present

## 2023-05-08 DIAGNOSIS — I10 Essential (primary) hypertension: Secondary | ICD-10-CM

## 2023-05-08 DIAGNOSIS — E538 Deficiency of other specified B group vitamins: Secondary | ICD-10-CM

## 2023-05-08 DIAGNOSIS — E785 Hyperlipidemia, unspecified: Secondary | ICD-10-CM | POA: Diagnosis not present

## 2023-05-08 DIAGNOSIS — R053 Chronic cough: Secondary | ICD-10-CM | POA: Insufficient documentation

## 2023-05-08 DIAGNOSIS — R0789 Other chest pain: Secondary | ICD-10-CM | POA: Insufficient documentation

## 2023-05-08 DIAGNOSIS — R002 Palpitations: Secondary | ICD-10-CM | POA: Insufficient documentation

## 2023-05-08 LAB — CBC WITH DIFFERENTIAL/PLATELET
Basophils Absolute: 0 10*3/uL (ref 0.0–0.1)
Basophils Relative: 0.3 % (ref 0.0–3.0)
Eosinophils Absolute: 0.2 10*3/uL (ref 0.0–0.7)
Eosinophils Relative: 2.8 % (ref 0.0–5.0)
HCT: 43.1 % (ref 39.0–52.0)
Hemoglobin: 14.4 g/dL (ref 13.0–17.0)
Lymphocytes Relative: 30.9 % (ref 12.0–46.0)
Lymphs Abs: 2.3 10*3/uL (ref 0.7–4.0)
MCHC: 33.4 g/dL (ref 30.0–36.0)
MCV: 94.8 fl (ref 78.0–100.0)
Monocytes Absolute: 0.6 10*3/uL (ref 0.1–1.0)
Monocytes Relative: 8.6 % (ref 3.0–12.0)
Neutro Abs: 4.2 10*3/uL (ref 1.4–7.7)
Neutrophils Relative %: 57.4 % (ref 43.0–77.0)
Platelets: 246 10*3/uL (ref 150.0–400.0)
RBC: 4.54 Mil/uL (ref 4.22–5.81)
RDW: 12.7 % (ref 11.5–15.5)
WBC: 7.4 10*3/uL (ref 4.0–10.5)

## 2023-05-08 LAB — URINALYSIS, ROUTINE W REFLEX MICROSCOPIC
Bilirubin Urine: NEGATIVE
Hgb urine dipstick: NEGATIVE
Ketones, ur: NEGATIVE
Leukocytes,Ua: NEGATIVE
Nitrite: NEGATIVE
RBC / HPF: NONE SEEN (ref 0–?)
Specific Gravity, Urine: 1.02 (ref 1.000–1.030)
Total Protein, Urine: NEGATIVE
Urine Glucose: NEGATIVE
Urobilinogen, UA: 1 (ref 0.0–1.0)
WBC, UA: NONE SEEN (ref 0–?)
pH: 6 (ref 5.0–8.0)

## 2023-05-08 LAB — BASIC METABOLIC PANEL
BUN: 24 mg/dL — ABNORMAL HIGH (ref 6–23)
CO2: 28 mEq/L (ref 19–32)
Calcium: 9.7 mg/dL (ref 8.4–10.5)
Chloride: 102 mEq/L (ref 96–112)
Creatinine, Ser: 1.34 mg/dL (ref 0.40–1.50)
GFR: 52.98 mL/min — ABNORMAL LOW (ref >60.00)
Glucose, Bld: 101 mg/dL — ABNORMAL HIGH (ref 70–99)
Potassium: 4.1 mEq/L (ref 3.5–5.1)
Sodium: 138 mEq/L (ref 135–145)

## 2023-05-08 LAB — TSH: TSH: 1.33 u[IU]/mL (ref 0.35–5.50)

## 2023-05-08 MED ORDER — FAMOTIDINE 40 MG PO TABS
40.0000 mg | ORAL_TABLET | Freq: Every day | ORAL | 1 refills | Status: DC
Start: 2023-05-08 — End: 2024-02-12

## 2023-05-08 NOTE — Progress Notes (Signed)
Subjective:  Patient ID: Jonathan Young, male    DOB: 08-25-1951  Age: 72 y.o. MRN: [id number]  CC: Annual Exam, Hyperlipidemia, Cough, and Gastroesophageal Reflux   HPI Jonathan Young presents for a CPX and f/up ----  He complains of a 3-year history of nonproductive cough, throat clearing, and chest heaviness.  He is active and denies dyspnea on exertion or exertional chest pain.  He has rare palpitations but denies dizziness, lightheadedness, or near syncope.  Outpatient Medications Prior to Visit  Medication Sig Dispense Refill   mupirocin ointment (BACTROBAN) 2 % Apply 1 Application topically 2 (two) times daily. To affected area till better 22 g 0   pantoprazole (PROTONIX) 40 MG tablet TAKE 1 TABLET(40 MG) BY MOUTH DAILY 90 tablet 0   rosuvastatin (CRESTOR) 10 MG tablet Take 1 tablet (10 mg total) by mouth daily. 90 tablet 1   Suvorexant (BELSOMRA) 15 MG TABS Take 1 tablet by mouth at bedtime as needed. 90 tablet 0   tadalafil (CIALIS) 20 MG tablet Take 1 tablet (20 mg total) by mouth See admin instructions. Take 20 mg by mouth one hour prior to intercourse- maximum of 20 mg (1 tablet) in 24 hours 20 tablet 2   valACYclovir (VALTREX) 1000 MG tablet Take 0.5 tablets (500 mg total) by mouth daily. 30 tablet 0   Vitamin D, Ergocalciferol, (DRISDOL) 1.25 MG (50000 UNIT) CAPS capsule TAKE 1 CAPSULE BY MOUTH EVERY 7 DAYS 12 capsule 0   azithromycin (ZITHROMAX) 500 MG tablet Take 1 tablet (500 mg total) by mouth daily. 3 tablet 0   No facility-administered medications prior to visit.    ROS Review of Systems  Constitutional:  Positive for fatigue. Negative for appetite change, chills, diaphoresis and unexpected weight change.  HENT: Negative.  Negative for trouble swallowing and voice change.   Eyes: Negative.   Respiratory:  Positive for cough. Negative for chest tightness, shortness of breath and wheezing.   Cardiovascular:  Positive for palpitations. Negative for chest pain and leg  swelling.  Gastrointestinal: Negative.  Negative for abdominal pain, constipation, diarrhea, nausea and vomiting.  Endocrine: Negative.   Genitourinary:  Positive for dysuria. Negative for difficulty urinating, flank pain, frequency and hematuria.  Musculoskeletal:  Positive for arthralgias. Negative for myalgias.  Skin: Negative.  Negative for rash.  Neurological:  Negative for dizziness, weakness and light-headedness.  Hematological:  Negative for adenopathy. Does not bruise/bleed easily.  Psychiatric/Behavioral: Negative.      Objective:  BP 132/78 (BP Location: Left Arm, Patient Position: Sitting, Cuff Size: Normal)   Pulse 90   Temp 98 F (36.7 C) (Oral)   Resp 16   Ht 6\' 1"  (1.854 m)   Wt 193 lb (87.5 kg)   SpO2 95%   BMI 25.46 kg/m   BP Readings from Last 3 Encounters:  05/08/23 132/78  04/15/23 (!) 152/95  02/05/23 135/83    Wt Readings from Last 3 Encounters:  05/08/23 193 lb (87.5 kg)  01/01/23 197 lb (89.4 kg)  10/03/22 200 lb (90.7 kg)    Physical Exam Vitals reviewed.  Constitutional:      Appearance: Normal appearance.  HENT:     Nose: Nose normal.     Mouth/Throat:     Mouth: Mucous membranes are moist.  Eyes:     General: No scleral icterus.    Conjunctiva/sclera: Conjunctivae normal.  Cardiovascular:     Rate and Rhythm: Normal rate and regular rhythm.     Heart sounds: No murmur  heard.    No friction rub. No gallop.     Comments: EKG- NSR, 77 bpm Incomplete RBBB No Q waves or ST/T wave changes Pulmonary:     Effort: Pulmonary effort is normal.     Breath sounds: No stridor. No wheezing, rhonchi or rales.  Abdominal:     General: Abdomen is flat.     Palpations: There is no mass.     Tenderness: There is no abdominal tenderness. There is no guarding.     Hernia: No hernia is present.  Musculoskeletal:        General: Normal range of motion.     Cervical back: Neck supple.     Right lower leg: No edema.     Left lower leg: No edema.   Lymphadenopathy:     Cervical: No cervical adenopathy.  Skin:    General: Skin is warm and dry.     Coloration: Skin is not pale.  Neurological:     General: No focal deficit present.     Mental Status: He is alert. Mental status is at baseline.  Psychiatric:        Mood and Affect: Mood normal.        Behavior: Behavior normal.     Lab Results  Component Value Date   WBC 7.4 05/08/2023   HGB 14.4 05/08/2023   HCT 43.1 05/08/2023   PLT 246.0 05/08/2023   GLUCOSE 101 (H) 05/08/2023   CHOL 139 04/26/2022   TRIG 82.0 04/26/2022   HDL 43.80 04/26/2022   LDLDIRECT 87.0 04/03/2016   LDLCALC 79 04/26/2022   ALT 26 01/01/2023   AST 31 01/01/2023   NA 138 05/08/2023   K 4.1 05/08/2023   CL 102 05/08/2023   CREATININE 1.34 05/08/2023   BUN 24 (H) 05/08/2023   CO2 28 05/08/2023   TSH 1.33 05/08/2023   PSA 5.6 02/04/2023   INR 1.0 04/18/2021   HGBA1C 5.8 04/03/2016   FINDINGS: Frontal and lateral views of the chest demonstrate an unremarkable cardiac silhouette. No airspace disease, effusion, or pneumothorax. No acute bony abnormality.   IMPRESSION: 1. No acute intrathoracic process.     Electronically Signed   By: Sharlet Salina M.D.   On: 10/17/2022 15:48  Assessment & Plan:   Chest heaviness- EKG is normal.  I have ordered a coronary calcium scan to evaluate for CAD and to get some images of his lungs. -     EKG 12-Lead -     CT CARDIAC SCORING (SELF PAY ONLY); Future  Intermittent palpitations- EKG and labs are reassuring. -     TSH; Future -     EKG 12-Lead  Chronic fatigue- Labs are negative for secondary causes. -     TSH; Future -     Testosterone Total,Free,Bio, Males; Future -     CT CARDIAC SCORING (SELF PAY ONLY); Future  CRI (chronic renal insufficiency), stage 3 (moderate) (HCC)- His renal function is stable. -     Urinalysis, Routine w reflex microscopic; Future -     Basic metabolic panel; Future  Essential hypertension- His blood pressure  is well-controlled. -     TSH; Future -     CBC with Differential/Platelet; Future -     Basic metabolic panel; Future -     EKG 12-Lead  Hyperlipidemia with target LDL less than 130- LDL goal achieved. Doing well on the statin  -     TSH; Future  Hypogonadism male- His testosterone is normal. -  Testosterone Total,Free,Bio, Males; Future  Vitamin B 12 deficiency- B12 is high. -     Vitamin B12; Future -     Folate; Future  LPRD (laryngopharyngeal reflux disease)- Will add an H2 blocker. -     Famotidine; Take 1 tablet (40 mg total) by mouth at bedtime.  Dispense: 90 tablet; Refill: 1  Abnormal electrocardiogram -     CT CARDIAC SCORING (SELF PAY ONLY); Future  Encounter for general adult medical examination with abnormal findings- Exam completed, labs reviewed, vaccines are UTD, cancer screenings are up-to-date, patient education was given.  IDIOPATHIC OSTEOPOROSIS -     DG Bone Density; Future     Follow-up: No follow-ups on file.  Sanda Linger, MD

## 2023-05-09 ENCOUNTER — Encounter: Payer: Self-pay | Admitting: Internal Medicine

## 2023-05-09 DIAGNOSIS — R9431 Abnormal electrocardiogram [ECG] [EKG]: Secondary | ICD-10-CM | POA: Insufficient documentation

## 2023-05-09 DIAGNOSIS — Z0001 Encounter for general adult medical examination with abnormal findings: Secondary | ICD-10-CM | POA: Insufficient documentation

## 2023-05-09 LAB — TESTOSTERONE TOTAL,FREE,BIO, MALES
Albumin: 4.8 g/dL (ref 3.6–5.1)
Sex Hormone Binding: 37 nmol/L (ref 22–77)
Testosterone, Bioavailable: 80.8 ng/dL (ref 15.0–150.0)
Testosterone, Free: 36.9 pg/mL (ref 6.0–73.0)
Testosterone: 323 ng/dL (ref 250–827)

## 2023-05-09 LAB — FOLATE: Folate: >23.9 ng/mL (ref >5.9)

## 2023-05-09 LAB — VITAMIN B12: Vitamin B-12: 1240 pg/mL — ABNORMAL HIGH (ref 211–911)

## 2023-05-12 ENCOUNTER — Encounter: Payer: Self-pay | Admitting: Internal Medicine

## 2023-05-12 ENCOUNTER — Other Ambulatory Visit: Payer: Self-pay | Admitting: Internal Medicine

## 2023-05-12 DIAGNOSIS — R5382 Chronic fatigue, unspecified: Secondary | ICD-10-CM

## 2023-05-15 ENCOUNTER — Ambulatory Visit
Admission: RE | Admit: 2023-05-15 | Discharge: 2023-05-15 | Disposition: A | Discharge status: Home / Self Care | Payer: No Typology Code available for payment source | Source: Ambulatory Visit | Attending: Internal Medicine | Admitting: Internal Medicine

## 2023-05-15 DIAGNOSIS — R0789 Other chest pain: Secondary | ICD-10-CM

## 2023-05-15 DIAGNOSIS — R5382 Chronic fatigue, unspecified: Secondary | ICD-10-CM

## 2023-05-15 DIAGNOSIS — R9431 Abnormal electrocardiogram [ECG] [EKG]: Secondary | ICD-10-CM

## 2023-05-20 ENCOUNTER — Other Ambulatory Visit: Payer: Self-pay | Admitting: Internal Medicine

## 2023-05-20 ENCOUNTER — Other Ambulatory Visit: Payer: PPO

## 2023-05-20 DIAGNOSIS — K219 Gastro-esophageal reflux disease without esophagitis: Secondary | ICD-10-CM

## 2023-05-21 ENCOUNTER — Other Ambulatory Visit (INDEPENDENT_AMBULATORY_CARE_PROVIDER_SITE_OTHER): Payer: PPO

## 2023-05-21 DIAGNOSIS — R5382 Chronic fatigue, unspecified: Secondary | ICD-10-CM

## 2023-05-21 LAB — CORTISOL: Cortisol, Plasma: 5.6 ug/dL

## 2023-06-11 DIAGNOSIS — H02834 Dermatochalasis of left upper eyelid: Secondary | ICD-10-CM | POA: Diagnosis not present

## 2023-06-11 DIAGNOSIS — H02831 Dermatochalasis of right upper eyelid: Secondary | ICD-10-CM | POA: Diagnosis not present

## 2023-06-11 DIAGNOSIS — H02401 Unspecified ptosis of right eyelid: Secondary | ICD-10-CM | POA: Diagnosis not present

## 2023-06-17 ENCOUNTER — Other Ambulatory Visit: Payer: Self-pay | Admitting: Internal Medicine

## 2023-06-17 DIAGNOSIS — E559 Vitamin D deficiency, unspecified: Secondary | ICD-10-CM

## 2023-06-27 ENCOUNTER — Other Ambulatory Visit: Payer: Self-pay | Admitting: Internal Medicine

## 2023-06-27 DIAGNOSIS — E785 Hyperlipidemia, unspecified: Secondary | ICD-10-CM

## 2023-07-05 ENCOUNTER — Ambulatory Visit
Admission: RE | Admit: 2023-07-05 | Discharge: 2023-07-05 | Disposition: A | Discharge status: Home / Self Care | Payer: PPO | Source: Ambulatory Visit | Attending: Internal Medicine | Admitting: Internal Medicine

## 2023-07-05 VITALS — BP 140/87 | HR 74 | Temp 98.0°F | Resp 16

## 2023-07-05 DIAGNOSIS — J209 Acute bronchitis, unspecified: Secondary | ICD-10-CM | POA: Diagnosis not present

## 2023-07-05 MED ORDER — AZITHROMYCIN 250 MG PO TABS
250.0000 mg | ORAL_TABLET | Freq: Every day | ORAL | 0 refills | Status: DC
Start: 2023-07-05 — End: 2023-12-15

## 2023-07-05 MED ORDER — BENZONATATE 200 MG PO CAPS: 200.0000 mg | ORAL_CAPSULE | Freq: Three times a day (TID) | ORAL | 0 refills | Status: DC | PRN

## 2023-07-05 NOTE — ED Provider Notes (Signed)
[facility]    CSN: [id number] Arrival date & time: 07/05/23  1147      History   Chief Complaint Chief Complaint  Patient presents with   Cough    HPI Jonathan Young is a 72 y.o. male presents for evaluation of a cough.  Patient reports 3 months of a dry cough that was initially worse at night but over the past 3 days has become more persistent throughout the day and is worsening.  He reports throat irritation but denies sore throat, fevers, congestion, runny nose, body aches, ear pain, shortness of breath.  No asthma or smoking history.  He did see his PCP regarding this a couple weeks ago states had a negative chest x-ray and was prescribed Protonix.  He states he took that intermittently but has been more consistent the past few days.  He is also taking allergy medicine.  Reports minimal improvement with the Protonix and allergy medicine.  He is also been taking over-the-counter Mucinex.  No other concerns at this time.   Cough   Past Medical History:  Diagnosis Date   Complication of anesthesia    sore throat from tube   Dysrhythmia    PVC'S   GERD (gastroesophageal reflux disease)    Hypercholesteremia    Osteoarthritis    Umbilical hernia     Patient Active Problem List   Diagnosis Date Noted   Abnormal electrocardiogram 05/09/2023   Encounter for general adult medical examination with abnormal findings 05/09/2023   Chest heaviness 05/08/2023   Intermittent palpitations 05/08/2023   Chronic cough 05/08/2023   Chronic fatigue 05/08/2023   LPRD (laryngopharyngeal reflux disease) 05/08/2023   Posterior tibialis tendinitis of both lower extremities 10/03/2022   Patellofemoral arthritis of right knee 08/19/2022   Psychophysiological insomnia 06/06/2020   Fatty infiltration of liver 03/02/2020   Nodule of lower lobe of right lung 02/14/2020   Maxillary sinusitis, chronic 06/29/2017   CRI (chronic renal insufficiency), stage 3 (moderate) (HCC) 04/07/2017    Pure hyperglyceridemia 04/03/2017   Essential hypertension 11/07/2016   GAD (generalized anxiety disorder) 10/17/2015   Erectile dysfunction of organic origin 10/10/2015   PSA elevation 01/10/2015   High risk homosexual behavior 12/29/2014   Routine general medical examination at a health care facility 12/29/2014   GERD (gastroesophageal reflux disease) 04/23/2011   IBS (irritable bowel syndrome) 04/23/2011   Vitamin D deficiency 01/16/2011   DUPUYTREN'S CONTRACTURE 01/16/2011   Hypogonadism male 07/25/2010   IDIOPATHIC OSTEOPOROSIS 11/01/2008   Vitamin B 12 deficiency 03/22/2008   Osteoarthritis 08/25/2007   Hyperlipidemia with target LDL less than 130 08/03/2007    Past Surgical History:  Procedure Laterality Date   CARPAL TUNNEL RELEASE Left 05/23/2018   CARPAL TUNNEL RELEASE Right 11/22/2018   CERVICAL SPINE SURGERY     HEMORRHOID SURGERY  1982   INSERTION OF MESH N/A 07/27/2020   Procedure: INSERTION OF MESH;  Surgeon: Harriette Bouillon, MD;  Location: [facility];  Service: General;  Laterality: N/A;   SHOULDER SURGERY     BIL   TONSILLECTOMY AND ADENOIDECTOMY     TOTAL HIP ARTHROPLASTY     RT TOAL HIP   TOTAL HIP ARTHROPLASTY  06/01/2012   Procedure: TOTAL HIP ARTHROPLASTY ANTERIOR APPROACH;  Surgeon: Shelda Pal, MD;  Location: [facility];  Service: Orthopedics;  Laterality: Left;   UMBILICAL HERNIA REPAIR N/A 07/27/2020   Procedure: UMBILICAL HERNIA REPAIR WITH MESH;  Surgeon: Harriette Bouillon, MD;  Location:  SURGERY CENTER;  Service: General;  Laterality: N/A;       Home Medications    Prior to Admission medications   Medication Sig Start Date End Date Taking? Authorizing Provider  azithromycin (ZITHROMAX) 250 MG tablet Take 1 tablet (250 mg total) by mouth daily. Take first 2 tablets together, then 1 every day until finished. 07/05/23  Yes Radford Pax, NP  benzonatate (TESSALON) 200 MG capsule Take 1 capsule (200 mg total) by mouth 3 (three)  times daily as needed. 07/05/23  Yes Radford Pax, NP  famotidine (PEPCID) 40 MG tablet Take 1 tablet (40 mg total) by mouth at bedtime. 05/08/23   Etta Grandchild, MD  mupirocin ointment (BACTROBAN) 2 % Apply 1 Application topically 2 (two) times daily. To affected area till better 02/05/23   Zenia Resides, MD  pantoprazole (PROTONIX) 40 MG tablet TAKE 1 TABLET(40 MG) BY MOUTH DAILY 05/20/23   Etta Grandchild, MD  rosuvastatin (CRESTOR) 10 MG tablet TAKE 1 TABLET(10 MG) BY MOUTH DAILY 06/27/23   Etta Grandchild, MD  Suvorexant (BELSOMRA) 15 MG TABS Take 1 tablet by mouth at bedtime as needed. 08/09/22   Etta Grandchild, MD  tadalafil (CIALIS) 20 MG tablet Take 1 tablet (20 mg total) by mouth See admin instructions. Take 20 mg by mouth one hour prior to intercourse- maximum of 20 mg (1 tablet) in 24 hours 04/29/22   Etta Grandchild, MD  valACYclovir (VALTREX) 1000 MG tablet Take 0.5 tablets (500 mg total) by mouth daily. 08/26/18   Etta Grandchild, MD  Vitamin D, Ergocalciferol, (DRISDOL) 1.25 MG (50000 UNIT) CAPS capsule TAKE 1 CAPSULE BY MOUTH EVERY 7 DAYS 06/17/23   Etta Grandchild, MD    Family History Family History  Problem Relation Age of Onset   Emphysema Mother    Colon cancer Neg Hx    Esophageal cancer Neg Hx    Rectal cancer Neg Hx    Stomach cancer Neg Hx     Social History Social History   Tobacco Use   Smoking status: Former    Current packs/day: 0.00    Types: Cigarettes    Quit date: 05/27/1981    Years since quitting: 42.1   Smokeless tobacco: Never  Vaping Use   Vaping status: Never Used  Substance Use Topics   Alcohol use: Yes    Alcohol/week: 20.0 standard drinks of alcohol    Types: 20 Shots of liquor per week    Comment: wine nightly   Drug use: No     Allergies   Vytorin [ezetimibe-simvastatin]   Review of Systems Review of Systems  Respiratory:  Positive for cough.      Physical Exam Triage Vital Signs ED Triage Vitals  Encounter Vitals Group      BP 07/05/23 1200 (!) 140/87     Systolic BP Percentile --      Diastolic BP Percentile --      Pulse Rate 07/05/23 1200 74     Resp 07/05/23 1200 16     Temp 07/05/23 1200 98 F (36.7 C)     Temp Source 07/05/23 1200 Oral     SpO2 07/05/23 1200 93 %     Weight --      Height --      Head Circumference --      Peak Flow --      Pain Score 07/05/23 1205 0     Pain Loc --      Pain Education --  Exclude from Growth Chart --    No data found.  Updated Vital Signs BP (!) 140/87 (BP Location: Right Arm)   Pulse 74   Temp 98 F (36.7 C) (Oral)   Resp 16   SpO2 93%   Visual Acuity Right Eye Distance:   Left Eye Distance:   Bilateral Distance:    Right Eye Near:   Left Eye Near:    Bilateral Near:     Physical Exam Vitals and nursing note reviewed.  Constitutional:      General: He is not in acute distress.    Appearance: Normal appearance. He is not ill-appearing or toxic-appearing.  HENT:     Head: Normocephalic and atraumatic.     Right Ear: Tympanic membrane and ear canal normal.     Left Ear: Tympanic membrane and ear canal normal.     Nose: No congestion or rhinorrhea.     Mouth/Throat:     Mouth: Mucous membranes are moist.     Pharynx: No oropharyngeal exudate or posterior oropharyngeal erythema.  Eyes:     Pupils: Pupils are equal, round, and reactive to light.  Cardiovascular:     Rate and Rhythm: Normal rate and regular rhythm.     Heart sounds: Normal heart sounds.  Pulmonary:     Effort: Pulmonary effort is normal.     Breath sounds: Normal breath sounds.  Musculoskeletal:     Cervical back: Normal range of motion and neck supple.  Lymphadenopathy:     Cervical: No cervical adenopathy.  Skin:    General: Skin is warm and dry.  Neurological:     General: No focal deficit present.     Mental Status: He is alert and oriented to person, place, and time.  Psychiatric:        Mood and Affect: Mood normal.        Behavior: Behavior normal.       UC Treatments / Results  Labs (all labs ordered are listed, but only abnormal results are displayed) Labs Reviewed - No data to display  EKG   Radiology No results found.  Procedures Procedures (including critical care time)  Medications Ordered in UC Medications - No data to display  Initial Impression / Assessment and Plan / UC Course  I have reviewed the triage vital signs and the nursing notes.  Pertinent labs & imaging results that were available during my care of the patient were reviewed by me and considered in my medical decision making (see chart for details).     Reviewed symptoms and exam with patient.  No red flags.  Given persistent cough we will start Zithromax.  Tessalon as needed for cough.  Advised to continue Protonix and allergy medicine.  PCP follow-up if symptoms do not improve.  ER precautions reviewed and patient verbalized understanding. Final Clinical Impressions(s) / UC Diagnoses   Final diagnoses:  Acute bronchitis, unspecified organism     Discharge Instructions      Start Zithromax as prescribed.  You may take Tessalon as needed for cough.  Continue your Protonix as prescribed by your PCP.  Lots of rest and fluids.  Please follow-up with your PCP if your symptoms do not improve.  Please go to the emergency room for any worsening symptoms.  Hope you feel better soon!    ED Prescriptions     Medication Sig Dispense Auth. Provider   azithromycin (ZITHROMAX) 250 MG tablet Take 1 tablet (250 mg total) by mouth daily. Take first  2 tablets together, then 1 every day until finished. 6 tablet Radford Pax, NP   benzonatate (TESSALON) 200 MG capsule Take 1 capsule (200 mg total) by mouth 3 (three) times daily as needed. 20 capsule Radford Pax, NP      PDMP not reviewed this encounter.   Radford Pax, NP 07/05/23 1251

## 2023-07-05 NOTE — Discharge Instructions (Signed)
Start Zithromax as prescribed.  You may take Tessalon as needed for cough.  Continue your Protonix as prescribed by your PCP.  Lots of rest and fluids.  Please follow-up with your PCP if your symptoms do not improve.  Please go to the emergency room for any worsening symptoms.  Hope you feel better soon!

## 2023-07-05 NOTE — ED Triage Notes (Signed)
Pt presents to UC w/ c/o dry cough for a few months. Pt states coughing is worse at night. Pt saw his pcp 6 weeks ago who prescribed protonix and famotidine which have helped some. Pt states over the past 4 days, cough has gotten "really bad throughout the day." Pt has also tried taking Mucinex and claritin without relief.

## 2023-08-18 ENCOUNTER — Other Ambulatory Visit: Payer: Self-pay | Admitting: Internal Medicine

## 2023-08-18 DIAGNOSIS — K219 Gastro-esophageal reflux disease without esophagitis: Secondary | ICD-10-CM

## 2023-09-02 ENCOUNTER — Other Ambulatory Visit: Payer: Self-pay | Admitting: Internal Medicine

## 2023-09-02 DIAGNOSIS — E559 Vitamin D deficiency, unspecified: Secondary | ICD-10-CM

## 2023-09-05 ENCOUNTER — Other Ambulatory Visit: Payer: Self-pay | Admitting: Internal Medicine

## 2023-09-05 DIAGNOSIS — E559 Vitamin D deficiency, unspecified: Secondary | ICD-10-CM

## 2023-09-09 MED ORDER — VITAMIN D (ERGOCALCIFEROL) 1.25 MG (50000 UNIT) PO CAPS
50000.0000 [IU] | ORAL_CAPSULE | ORAL | 0 refills | Status: DC
Start: 2023-09-09 — End: 2023-11-28

## 2023-09-17 ENCOUNTER — Other Ambulatory Visit: Payer: Self-pay | Admitting: Internal Medicine

## 2023-09-17 DIAGNOSIS — K219 Gastro-esophageal reflux disease without esophagitis: Secondary | ICD-10-CM

## 2023-10-24 ENCOUNTER — Ambulatory Visit (HOSPITAL_COMMUNITY): Payer: Self-pay

## 2023-10-25 ENCOUNTER — Inpatient Hospital Stay (HOSPITAL_COMMUNITY)
Admission: RE | Admit: 2023-10-25 | Discharge: 2023-10-25 | Discharge status: Home / Self Care | Payer: PPO | Source: Ambulatory Visit | Attending: Internal Medicine

## 2023-10-25 ENCOUNTER — Other Ambulatory Visit: Payer: Self-pay | Admitting: Internal Medicine

## 2023-10-25 ENCOUNTER — Encounter (HOSPITAL_COMMUNITY): Payer: Self-pay

## 2023-10-25 VITALS — BP 113/69 | HR 81 | Temp 97.8°F | Resp 16

## 2023-10-25 DIAGNOSIS — R300 Dysuria: Secondary | ICD-10-CM | POA: Diagnosis not present

## 2023-10-25 DIAGNOSIS — K219 Gastro-esophageal reflux disease without esophagitis: Secondary | ICD-10-CM

## 2023-10-25 LAB — POCT URINALYSIS DIP (MANUAL ENTRY)
Bilirubin, UA: NEGATIVE
Blood, UA: NEGATIVE
Glucose, UA: NEGATIVE mg/dL
Ketones, POC UA: NEGATIVE mg/dL
Leukocytes, UA: NEGATIVE
Nitrite, UA: NEGATIVE
Protein Ur, POC: NEGATIVE mg/dL
Spec Grav, UA: 1.02 (ref 1.010–1.025)
Urobilinogen, UA: 1 U/dL
pH, UA: 5.5 (ref 5.0–8.0)

## 2023-10-25 NOTE — Discharge Instructions (Signed)
Negative for signs and symptoms of UTI. Considering creasing hydration until urine is clear. Follow-up with urologist.

## 2023-10-25 NOTE — ED Triage Notes (Addendum)
Pt c/o weird feeling in urethra. States it feels like "something crawling around"  This has been going on for about 1 week. Pt has prostate issues.   Pt needs to leave by 3:15

## 2023-10-25 NOTE — ED Provider Notes (Signed)
[facility]    CSN: [id number] Arrival date & time: 10/25/23  1348      History   Chief Complaint Chief Complaint  Patient presents with   Urinary Retention    HPI Jonathan Young is a 72 y.o. male.   Patient is reporting abnormal sensation in the urethral area.  He describes it as tingling and unsure if he has a UTI.  He denies any back pain suprapubic pain no fevers. Not sexually active  The history is provided by the patient.    Past Medical History:  Diagnosis Date   Complication of anesthesia    sore throat from tube   Dysrhythmia    PVC'S   GERD (gastroesophageal reflux disease)    Hypercholesteremia    Osteoarthritis    Umbilical hernia     Patient Active Problem List   Diagnosis Date Noted   Abnormal electrocardiogram 05/09/2023   Encounter for general adult medical examination with abnormal findings 05/09/2023   Chest heaviness 05/08/2023   Intermittent palpitations 05/08/2023   Chronic cough 05/08/2023   Chronic fatigue 05/08/2023   LPRD (laryngopharyngeal reflux disease) 05/08/2023   Posterior tibialis tendinitis of both lower extremities 10/03/2022   Patellofemoral arthritis of right knee 08/19/2022   Psychophysiological insomnia 06/06/2020   Fatty infiltration of liver 03/02/2020   Nodule of lower lobe of right lung 02/14/2020   Maxillary sinusitis, chronic 06/29/2017   CRI (chronic renal insufficiency), stage 3 (moderate) (HCC) 04/07/2017   Pure hyperglyceridemia 04/03/2017   Essential hypertension 11/07/2016   GAD (generalized anxiety disorder) 10/17/2015   Erectile dysfunction of organic origin 10/10/2015   PSA elevation 01/10/2015   High risk homosexual behavior 12/29/2014   Routine general medical examination at a health care facility 12/29/2014   GERD (gastroesophageal reflux disease) 04/23/2011   IBS (irritable bowel syndrome) 04/23/2011   Vitamin D deficiency 01/16/2011   DUPUYTREN'S CONTRACTURE 01/16/2011   Hypogonadism  male 07/25/2010   IDIOPATHIC OSTEOPOROSIS 11/01/2008   Vitamin B 12 deficiency 03/22/2008   Osteoarthritis 08/25/2007   Hyperlipidemia with target LDL less than 130 08/03/2007    Past Surgical History:  Procedure Laterality Date   CARPAL TUNNEL RELEASE Left 05/23/2018   CARPAL TUNNEL RELEASE Right 11/22/2018   CERVICAL SPINE SURGERY     HEMORRHOID SURGERY  1982   INSERTION OF MESH N/A 07/27/2020   Procedure: INSERTION OF MESH;  Surgeon: Harriette Bouillon, MD;  Location: [facility];  Service: General;  Laterality: N/A;   SHOULDER SURGERY     BIL   TONSILLECTOMY AND ADENOIDECTOMY     TOTAL HIP ARTHROPLASTY     RT TOAL HIP   TOTAL HIP ARTHROPLASTY  06/01/2012   Procedure: TOTAL HIP ARTHROPLASTY ANTERIOR APPROACH;  Surgeon: Shelda Pal, MD;  Location: [facility];  Service: Orthopedics;  Laterality: Left;   UMBILICAL HERNIA REPAIR N/A 07/27/2020   Procedure: UMBILICAL HERNIA REPAIR WITH MESH;  Surgeon: Harriette Bouillon, MD;  Location: [facility];  Service: General;  Laterality: N/A;       Home Medications    Prior to Admission medications   Medication Sig Start Date End Date Taking? Authorizing Provider  azithromycin (ZITHROMAX) 250 MG tablet Take 1 tablet (250 mg total) by mouth daily. Take first 2 tablets together, then 1 every day until finished. 07/05/23   Radford Pax, NP  benzonatate (TESSALON) 200 MG capsule Take 1 capsule (200 mg total) by mouth 3 (three) times daily as needed. 07/05/23   Stacie Acres,  Hipolito Bayley, NP  famotidine (PEPCID) 40 MG tablet Take 1 tablet (40 mg total) by mouth at bedtime. 05/08/23   Etta Grandchild, MD  mupirocin ointment (BACTROBAN) 2 % Apply 1 Application topically 2 (two) times daily. To affected area till better 02/05/23   Zenia Resides, MD  pantoprazole (PROTONIX) 40 MG tablet TAKE 1 TABLET(40 MG) BY MOUTH DAILY 09/17/23   Etta Grandchild, MD  rosuvastatin (CRESTOR) 10 MG tablet TAKE 1 TABLET(10 MG) BY MOUTH DAILY 06/27/23   Etta Grandchild, MD  Suvorexant (BELSOMRA) 15 MG TABS Take 1 tablet by mouth at bedtime as needed. 08/09/22   Etta Grandchild, MD  tadalafil (CIALIS) 20 MG tablet Take 1 tablet (20 mg total) by mouth See admin instructions. Take 20 mg by mouth one hour prior to intercourse- maximum of 20 mg (1 tablet) in 24 hours 04/29/22   Etta Grandchild, MD  valACYclovir (VALTREX) 1000 MG tablet Take 0.5 tablets (500 mg total) by mouth daily. 08/26/18   Etta Grandchild, MD  Vitamin D, Ergocalciferol, (DRISDOL) 1.25 MG (50000 UNIT) CAPS capsule Take 1 capsule (50,000 Units total) by mouth every 7 (seven) days. 09/09/23   Etta Grandchild, MD    Family History Family History  Problem Relation Age of Onset   Emphysema Mother    Colon cancer Neg Hx    Esophageal cancer Neg Hx    Rectal cancer Neg Hx    Stomach cancer Neg Hx     Social History Social History   Tobacco Use   Smoking status: Former    Current packs/day: 0.00    Types: Cigarettes    Quit date: 05/27/1981    Years since quitting: 42.4   Smokeless tobacco: Never  Vaping Use   Vaping status: Never Used  Substance Use Topics   Alcohol use: Yes    Alcohol/week: 20.0 standard drinks of alcohol    Types: 20 Shots of liquor per week    Comment: wine nightly   Drug use: No     Allergies   Vytorin [ezetimibe-simvastatin]   Review of Systems Review of Systems  Genitourinary: Negative.        Altered urethral sensation  All other systems reviewed and are negative.    Physical Exam Triage Vital Signs ED Triage Vitals  Encounter Vitals Group     BP 10/25/23 1441 113/69     Systolic BP Percentile --      Diastolic BP Percentile --      Pulse Rate 10/25/23 1441 81     Resp 10/25/23 1441 16     Temp 10/25/23 1441 97.8 F (36.6 C)     Temp Source 10/25/23 1441 Oral     SpO2 10/25/23 1441 95 %     Weight --      Height --      Head Circumference --      Peak Flow --      Pain Score 10/25/23 1439 0     Pain Loc --      Pain Education --       Exclude from Growth Chart --    No data found.  Updated Vital Signs BP 113/69 (BP Location: Left Arm)   Pulse 81   Temp 97.8 F (36.6 C) (Oral)   Resp 16   SpO2 95%   Visual Acuity Right Eye Distance:   Left Eye Distance:   Bilateral Distance:    Right Eye Near:   Left  Eye Near:    Bilateral Near:     Physical Exam Abdominal:     Comments: Nontender  Genitourinary:    Comments: Denied all symptoms physical exam deferred Neurological:     Mental Status: He is alert.      UC Treatments / Results  Labs (all labs ordered are listed, but only abnormal results are displayed) Labs Reviewed  POCT URINALYSIS DIP (MANUAL ENTRY)    EKG   Radiology No results found.  Procedures Procedures (including critical care time)  Medications Ordered in UC Medications - No data to display  Initial Impression / Assessment and Plan / UC Course  I have reviewed the triage vital signs and the nursing notes.  Pertinent labs & imaging results that were available during my care of the patient were reviewed by me and considered in my medical decision making (see chart for details).   No signs or symptoms of UTI. He is not sexually active so probability of STI is minimal. He does have issues with prostate and possible strictures.    Abnormal sensation may be directly due to acidity of urine. Reviewed water intake, which is minimal causing urine concentration.   No other symptoms at this time.  He does have an appointment with his urologist in 2 weeks which he is encouraged to keep and discuss symptoms for further work up.      Final Clinical Impressions(s) / UC Diagnoses   Final diagnoses:  Dysuria     Discharge Instructions      Negative for signs and symptoms of UTI. Considering creasing hydration until urine is clear. Follow-up with urologist.     ED Prescriptions   None    PDMP not reviewed this encounter.   Nelda Marseille, NP 11/07/23 2211

## 2023-10-29 DIAGNOSIS — L570 Actinic keratosis: Secondary | ICD-10-CM | POA: Diagnosis not present

## 2023-10-29 DIAGNOSIS — D485 Neoplasm of uncertain behavior of skin: Secondary | ICD-10-CM | POA: Diagnosis not present

## 2023-10-29 DIAGNOSIS — L821 Other seborrheic keratosis: Secondary | ICD-10-CM | POA: Diagnosis not present

## 2023-11-06 DIAGNOSIS — R3912 Poor urinary stream: Secondary | ICD-10-CM | POA: Diagnosis not present

## 2023-11-06 DIAGNOSIS — N401 Enlarged prostate with lower urinary tract symptoms: Secondary | ICD-10-CM | POA: Diagnosis not present

## 2023-11-06 DIAGNOSIS — R972 Elevated prostate specific antigen [PSA]: Secondary | ICD-10-CM | POA: Diagnosis not present

## 2023-11-06 LAB — PSA: PSA: 8.1

## 2023-11-11 ENCOUNTER — Ambulatory Visit (INDEPENDENT_AMBULATORY_CARE_PROVIDER_SITE_OTHER): Payer: PPO | Admitting: *Deleted

## 2023-11-11 DIAGNOSIS — Z0000 Encounter for general adult medical examination without abnormal findings: Secondary | ICD-10-CM | POA: Diagnosis not present

## 2023-11-11 NOTE — Patient Instructions (Signed)
[name] , Thank you for taking time to come for your Medicare Wellness Visit. I appreciate your ongoing commitment to your health goals. Please review the following plan we discussed and let me know if I can assist you in the future.   Screening recommendations/referrals: Colonoscopy: up to date Recommended yearly ophthalmology/optometry visit for glaucoma screening and checkup Recommended yearly dental visit for hygiene and checkup  Vaccinations: Influenza vaccine: Education provided Pneumococcal vaccine: up to date Tdap vaccine: up to date Shingles vaccine: up to date    Advanced directives: yes not on file   Preventive Care 65 Years and Older, Male Preventive care refers to lifestyle choices and visits with your health care provider that can promote health and wellness. What does preventive care include? A yearly physical exam. This is also called an annual well check. Dental exams once or twice a year. Routine eye exams. Ask your health care provider how often you should have your eyes checked. Personal lifestyle choices, including: Daily care of your teeth and gums. Regular physical activity. Eating a healthy diet. Avoiding tobacco and drug use. Limiting alcohol use. Practicing safe sex. Taking low doses of aspirin every day. Taking vitamin and mineral supplements as recommended by your health care provider. What happens during an annual well check? The services and screenings done by your health care provider during your annual well check will depend on your age, overall health, lifestyle risk factors, and family history of disease. Counseling  Your health care provider may ask you questions about your: Alcohol use. Tobacco use. Drug use. Emotional well-being. Home and relationship well-being. Sexual activity. Eating habits. History of falls. Memory and ability to understand (cognition). Work and work Astronomer. Screening  You may have the following tests or  measurements: Height, weight, and BMI. Blood pressure. Lipid and cholesterol levels. These may be checked every 5 years, or more frequently if you are over 26 years old. Skin check. Lung cancer screening. You may have this screening every year starting at age 4 if you have a 30-pack-year history of smoking and currently smoke or have quit within the past 15 years. Fecal occult blood test (FOBT) of the stool. You may have this test every year starting at age 40. Flexible sigmoidoscopy or colonoscopy. You may have a sigmoidoscopy every 5 years or a colonoscopy every 10 years starting at age 71. Prostate cancer screening. Recommendations will vary depending on your family history and other risks. Hepatitis C blood test. Hepatitis B blood test. Sexually transmitted disease (STD) testing. Diabetes screening. This is done by checking your blood sugar (glucose) after you have not eaten for a while (fasting). You may have this done every 1-3 years. Abdominal aortic aneurysm (AAA) screening. You may need this if you are a current or former smoker. Osteoporosis. You may be screened starting at age 59 if you are at high risk. Talk with your health care provider about your test results, treatment options, and if necessary, the need for more tests. Vaccines  Your health care provider may recommend certain vaccines, such as: Influenza vaccine. This is recommended every year. Tetanus, diphtheria, and acellular pertussis (Tdap, Td) vaccine. You may need a Td booster every 10 years. Zoster vaccine. You may need this after age 44. Pneumococcal 13-valent conjugate (PCV13) vaccine. One dose is recommended after age 70. Pneumococcal polysaccharide (PPSV23) vaccine. One dose is recommended after age 11. Talk to your health care provider about which screenings and vaccines you need and how often you need them.  This information is not intended to replace advice given to you by your health care provider. Make sure  you discuss any questions you have with your health care provider. Document Released: 01/05/2016 Document Revised: 08/28/2016 Document Reviewed: 10/10/2015 Elsevier Interactive Patient Education  2017 [employer].  Fall Prevention in the Home Falls can cause injuries. They can happen to people of all ages. There are many things you can do to make your home safe and to help prevent falls. What can I do on the outside of my home? Regularly fix the edges of walkways and driveways and fix any cracks. Remove anything that might make you trip as you walk through a door, such as a raised step or threshold. Trim any bushes or trees on the path to your home. Use bright outdoor lighting. Clear any walking paths of anything that might make someone trip, such as rocks or tools. Regularly check to see if handrails are loose or broken. Make sure that both sides of any steps have handrails. Any raised decks and porches should have guardrails on the edges. Have any leaves, snow, or ice cleared regularly. Use sand or salt on walking paths during winter. Clean up any spills in your garage right away. This includes oil or grease spills. What can I do in the bathroom? Use night lights. Install grab bars by the toilet and in the tub and shower. Do not use towel bars as grab bars. Use non-skid mats or decals in the tub or shower. If you need to sit down in the shower, use a plastic, non-slip stool. Keep the floor dry. Clean up any water that spills on the floor as soon as it happens. Remove soap buildup in the tub or shower regularly. Attach bath mats securely with double-sided non-slip rug tape. Do not have throw rugs and other things on the floor that can make you trip. What can I do in the bedroom? Use night lights. Make sure that you have a light by your bed that is easy to reach. Do not use any sheets or blankets that are too big for your bed. They should not hang down onto the floor. Have a firm  chair that has side arms. You can use this for support while you get dressed. Do not have throw rugs and other things on the floor that can make you trip. What can I do in the kitchen? Clean up any spills right away. Avoid walking on wet floors. Keep items that you use a lot in easy-to-reach places. If you need to reach something above you, use a strong step stool that has a grab bar. Keep electrical cords out of the way. Do not use floor polish or wax that makes floors slippery. If you must use wax, use non-skid floor wax. Do not have throw rugs and other things on the floor that can make you trip. What can I do with my stairs? Do not leave any items on the stairs. Make sure that there are handrails on both sides of the stairs and use them. Fix handrails that are broken or loose. Make sure that handrails are as long as the stairways. Check any carpeting to make sure that it is firmly attached to the stairs. Fix any carpet that is loose or worn. Avoid having throw rugs at the top or bottom of the stairs. If you do have throw rugs, attach them to the floor with carpet tape. Make sure that you have a light switch at the  top of the stairs and the bottom of the stairs. If you do not have them, ask someone to add them for you. What else can I do to help prevent falls? Wear shoes that: Do not have high heels. Have rubber bottoms. Are comfortable and fit you well. Are closed at the toe. Do not wear sandals. If you use a stepladder: Make sure that it is fully opened. Do not climb a closed stepladder. Make sure that both sides of the stepladder are locked into place. Ask someone to hold it for you, if possible. Clearly mark and make sure that you can see: Any grab bars or handrails. First and last steps. Where the edge of each step is. Use tools that help you move around (mobility aids) if they are needed. These include: Canes. Walkers. Scooters. Crutches. Turn on the lights when you go  into a dark area. Replace any light bulbs as soon as they burn out. Set up your furniture so you have a clear path. Avoid moving your furniture around. If any of your floors are uneven, fix them. If there are any pets around you, be aware of where they are. Review your medicines with your doctor. Some medicines can make you feel dizzy. This can increase your chance of falling. Ask your doctor what other things that you can do to help prevent falls. This information is not intended to replace advice given to you by your health care provider. Make sure you discuss any questions you have with your health care provider. Document Released: 10/05/2009 Document Revised: 05/16/2016 Document Reviewed: 01/13/2015 Elsevier Interactive Patient Education  2017 [employer].

## 2023-11-11 NOTE — Progress Notes (Signed)
Subjective:   Jonathan Young is a 72 y.o. male who presents for Medicare Annual/Subsequent preventive examination.  Visit Complete: Virtual I connected with  Saddie Benders on 11/11/23 by a audio enabled telemedicine application and verified that I am speaking with the correct person using two identifiers.  Patient Location: Home  Provider Location: Home Office  I discussed the limitations of evaluation and management by telemedicine. The patient expressed understanding and agreed to proceed.  Vital Signs: Because this visit was a virtual/telehealth visit, some criteria may be missing or patient reported. Any vitals not documented were not able to be obtained and vitals that have been documented are patient reported.    Cardiac Risk Factors include: advanced age (>50men, >70 women);male gender     Objective:    There were no vitals filed for this visit. There is no height or weight on file to calculate BMI.     10/22/2022    9:28 AM 07/27/2020    6:46 AM 07/21/2020   11:35 AM 04/02/2019    4:23 PM 08/26/2018    3:39 PM 08/20/2017    5:21 PM 08/20/2017    4:31 PM  Advanced Directives  Does Patient Have a Medical Advance Directive? Yes Yes Yes No Yes No No  Type of Estate agent of [place];Living will Living will Living will  Healthcare Power of [place];Living will    Does patient want to make changes to medical advance directive?  No - Guardian declined No - Patient declined      Copy of Healthcare Power of Attorney in Chart? No - copy requested    No - copy requested    Would patient like information on creating a medical advance directive?    No - Patient declined       Current Medications (verified) Outpatient Encounter Medications as of 11/11/2023  Medication Sig   mupirocin ointment (BACTROBAN) 2 % Apply 1 Application topically 2 (two) times daily. To affected area till better   pantoprazole (PROTONIX) 40 MG tablet TAKE 1 TABLET(40 MG) BY MOUTH DAILY    rosuvastatin (CRESTOR) 10 MG tablet TAKE 1 TABLET(10 MG) BY MOUTH DAILY   Suvorexant (BELSOMRA) 15 MG TABS Take 1 tablet by mouth at bedtime as needed.   tadalafil (CIALIS) 20 MG tablet Take 1 tablet (20 mg total) by mouth See admin instructions. Take 20 mg by mouth one hour prior to intercourse- maximum of 20 mg (1 tablet) in 24 hours   valACYclovir (VALTREX) 1000 MG tablet Take 0.5 tablets (500 mg total) by mouth daily.   Vitamin D, Ergocalciferol, (DRISDOL) 1.25 MG (50000 UNIT) CAPS capsule Take 1 capsule (50,000 Units total) by mouth every 7 (seven) days.   azithromycin (ZITHROMAX) 250 MG tablet Take 1 tablet (250 mg total) by mouth daily. Take first 2 tablets together, then 1 every day until finished.   benzonatate (TESSALON) 200 MG capsule Take 1 capsule (200 mg total) by mouth 3 (three) times daily as needed.   famotidine (PEPCID) 40 MG tablet Take 1 tablet (40 mg total) by mouth at bedtime. (Patient not taking: Reported on 11/11/2023)   No facility-administered encounter medications on file as of 11/11/2023.    Allergies (verified) Vytorin [ezetimibe-simvastatin]   History: Past Medical History:  Diagnosis Date   Complication of anesthesia    sore throat from tube   Dysrhythmia    PVC'S   GERD (gastroesophageal reflux disease)    Hypercholesteremia    Osteoarthritis    Umbilical  hernia    Past Surgical History:  Procedure Laterality Date   CARPAL TUNNEL RELEASE Left 05/23/2018   CARPAL TUNNEL RELEASE Right 11/22/2018   CERVICAL SPINE SURGERY     HEMORRHOID SURGERY  1982   INSERTION OF MESH N/A 07/27/2020   Procedure: INSERTION OF MESH;  Surgeon: Harriette Bouillon, MD;  Location: [facility];  Service: General;  Laterality: N/A;   SHOULDER SURGERY     BIL   TONSILLECTOMY AND ADENOIDECTOMY     TOTAL HIP ARTHROPLASTY     RT TOAL HIP   TOTAL HIP ARTHROPLASTY  06/01/2012   Procedure: TOTAL HIP ARTHROPLASTY ANTERIOR APPROACH;  Surgeon: Shelda Pal, MD;   Location: [facility];  Service: Orthopedics;  Laterality: Left;   UMBILICAL HERNIA REPAIR N/A 07/27/2020   Procedure: UMBILICAL HERNIA REPAIR WITH MESH;  Surgeon: Harriette Bouillon, MD;  Location: [facility];  Service: General;  Laterality: N/A;   Family History  Problem Relation Age of Onset   Emphysema Mother    Colon cancer Neg Hx    Esophageal cancer Neg Hx    Rectal cancer Neg Hx    Stomach cancer Neg Hx    Social History   Socioeconomic History   Marital status: Single    Spouse name: Not on file   Number of children: Not on file   Years of education: Not on file   Highest education level: Bachelor's degree (e.g., BA, AB, BS)  Occupational History   Not on file  Tobacco Use   Smoking status: Former    Current packs/day: 0.00    Types: Cigarettes    Quit date: 05/27/1981    Years since quitting: 42.4   Smokeless tobacco: Never  Vaping Use   Vaping status: Never Used  Substance and Sexual Activity   Alcohol use: Yes    Alcohol/week: 20.0 standard drinks of alcohol    Types: 20 Shots of liquor per week    Comment: wine nightly   Drug use: No   Sexual activity: Yes    Partners: Male  Other Topics Concern   Not on file  Social History Narrative   Not on file   Social Determinants of Health   Financial Resource Strain: Low Risk  (11/11/2023)   Overall Financial Resource Strain (CARDIA)    Difficulty of Paying Living Expenses: Not hard at all  Food Insecurity: No Food Insecurity (11/11/2023)   Hunger Vital Sign    Worried About Running Out of Food in the Last Year: Never true    Ran Out of Food in the Last Year: Never true  Transportation Needs: No Transportation Needs (11/11/2023)   PRAPARE - Administrator, Civil Service (Medical): No    Lack of Transportation (Non-Medical): No  Physical Activity: Sufficiently Active (11/11/2023)   Exercise Vital Sign    Days of Exercise per Week: 3 days    Minutes of Exercise per Session: 60 min  Stress:  Stress Concern Present (11/11/2023)   [employer] of Occupational Health - Occupational Stress Questionnaire    Feeling of Stress : To some extent  Social Connections: Moderately Isolated (11/11/2023)   Social Connection and Isolation Panel [NHANES]    Frequency of Communication with Friends and Family: More than three times a week    Frequency of Social Gatherings with Friends and Family: More than three times a week    Attends Religious Services: Never    Database administrator or Organizations: No  Attends Banker Meetings: Never    Marital Status: Living with partner    Tobacco Counseling Counseling given: Not Answered   Clinical Intake:  Pre-visit preparation completed: Yes  Pain : No/denies pain     Diabetes: No  How often do you need to have someone help you when you read instructions, pamphlets, or other written materials from your doctor or pharmacy?: 1 - Never  Interpreter Needed?: No  Information entered by :: Remi Haggard LPN   Activities of Daily Living    11/11/2023    8:28 AM  In your present state of health, do you have any difficulty performing the following activities:  Hearing? 0  Vision? 0  Difficulty concentrating or making decisions? 0  Walking or climbing stairs? 0  Dressing or bathing? 0  Doing errands, shopping? 0  Using the Toilet? N  In the past six months, have you accidently leaked urine? N  Do you have problems with loss of bowel control? N  Managing your Medications? N  Managing your Finances? N  Housekeeping or managing your Housekeeping? N    Patient Care Team: Etta Grandchild, MD as PCP - General (Internal Medicine) Ander Purpura, OD as Consulting Physician (Optometry)  Indicate any recent Medical Services you may have received from other than [facility] providers in the past year (date may be approximate).     Assessment:   This is a routine wellness examination for Jonathan Young.  Hearing/Vision screen Hearing  Screening - Comments:: No trouble hearing Vision Screening - Comments:: Not up to date   Goals Addressed             This Visit's Progress    Patient Stated       Travel and partial retire       Depression Screen    11/11/2023    8:21 AM 05/08/2023    2:32 PM 10/22/2022    9:23 AM 04/18/2021    8:09 AM 03/02/2020   11:01 AM 09/23/2019    2:02 PM 06/17/2019   12:32 PM  PHQ 2/9 Scores  PHQ - 2 Score 0 1 0 1 0 1 1  PHQ- 9 Score 3     4 4     Fall Risk    11/11/2023    8:18 AM 05/08/2023    2:32 PM 10/22/2022    9:29 AM 10/20/2022    4:57 PM 04/18/2021    8:10 AM  Fall Risk   Falls in the past year? 0 0 0 0 0  Number falls in past yr: 0 0 0 0 0  Injury with Fall? 0 0 0 0 0  Risk for fall due to :  No Fall Risks No Fall Risks    Follow up Falls evaluation completed;Education provided;Falls prevention discussed Falls evaluation completed Falls evaluation completed      MEDICARE RISK AT HOME: Medicare Risk at Home Any stairs in or around the home?: No If so, are there any without handrails?: No Home free of loose throw rugs in walkways, pet beds, electrical cords, etc?: Yes Adequate lighting in your home to reduce risk of falls?: Yes Life alert?: No Use of a cane, walker or w/c?: No Grab bars in the bathroom?: No Shower chair or bench in shower?: No Elevated toilet seat or a handicapped toilet?: No  TIMED UP AND GO:  Was the test performed?  No    Cognitive Function:        11/11/2023    8:19 AM  10/22/2022    9:30 AM  6CIT Screen  What Year? 0 points 0 points  What month? 0 points 0 points  What time? 0 points 0 points  Count back from 20 0 points 0 points  Months in reverse 0 points 0 points  Repeat phrase 0 points 0 points  Total Score 0 points 0 points    Immunizations Immunization History  Administered Date(s) Administered   Fluad Quad(high Dose 65+) 08/25/2019, 09/18/2022   Influenza Split 09/17/2012, 08/24/2013   Influenza Whole 10/23/2000,  09/28/2009, 09/20/2010   Influenza, High Dose Seasonal PF 08/26/2018   Influenza,inj,Quad PF,6+ Mos 08/24/2014, 08/18/2015   Influenza-Unspecified 10/01/2016, 11/11/2017   PFIZER Comirnaty(Gray Top)Covid-19 Tri-Sucrose Vaccine 09/18/2022   PFIZER(Purple Top)SARS-COV-2 Vaccination 01/12/2020, 01/31/2020, 09/14/2020, 04/01/2021   Pfizer Covid-19 Vaccine Bivalent Booster 30yrs & up 04/22/2022   Pneumococcal Conjugate-13 10/01/2016   Pneumococcal Polysaccharide-23 11/11/2017, 01/01/2023   Td 12/23/2005   Tdap 03/17/2016, 04/15/2023   Zoster Recombinant(Shingrix) 07/16/2017, 11/22/2017   Zoster, Live 01/16/2011    TDAP status: Up to date  Flu Vaccine status: Due, Education has been provided regarding the importance of this vaccine. Advised may receive this vaccine at local pharmacy or Health Dept. Aware to provide a copy of the vaccination record if obtained from local pharmacy or Health Dept. Verbalized acceptance and understanding.  Pneumococcal vaccine status: Up to date  Covid-19 vaccine status: Information provided on how to obtain vaccines.   Qualifies for Shingles Vaccine? No   Zostavax completed Yes   Shingrix Completed?: Yes  Screening Tests Health Maintenance  Topic Date Due   COVID-19 Vaccine (7 - 2023-24 season) 11/11/2023 (Originally 08/24/2023)   INFLUENZA VACCINE  03/22/2024 (Originally 07/24/2023)   Medicare Annual Wellness (AWV)  11/10/2024   Colonoscopy  01/31/2028   DTaP/Tdap/Td (4 - Td or Tdap) 04/14/2033   Pneumonia Vaccine 64+ Years old  Completed   Hepatitis C Screening  Completed   Zoster Vaccines- Shingrix  Completed   HPV VACCINES  Aged Out    Health Maintenance  There are no preventive care reminders to display for this patient.   Colorectal cancer screening: Type of screening: Colonoscopy. Completed 2024. Repeat every 10 years  Lung Cancer Screening: (Low Dose CT Chest recommended if Age 61-80 years, 20 pack-year currently smoking OR have quit w/in  15years.) does not qualify.   Lung Cancer Screening Referral:   Additional Screening:  Hepatitis C Screening: does not qualify; Completed 2016  Vision Screening: Recommended annual ophthalmology exams for early detection of glaucoma and other disorders of the eye. Is the patient up to date with their annual eye exam?  No  Who is the provider or what is the name of the office in which the patient attends annual eye exams? Looking ofr a new MD If pt is not established with a provider, would they like to be referred to a provider to establish care? No .   Dental Screening: Recommended annual dental exams for proper oral hygiene    Community Resource Referral / Chronic Care Management: CRR required this visit?  No   CCM required this visit?  No     Plan:     I have personally reviewed and noted the following in the patient's chart:   Medical and social history Use of alcohol, tobacco or illicit drugs  Current medications and supplements including opioid prescriptions. Patient is not currently taking opioid prescriptions. Functional ability and status Nutritional status Physical activity Advanced directives List of other physicians Hospitalizations, surgeries, and  ER visits in previous 12 months Vitals Screenings to include cognitive, depression, and falls Referrals and appointments  In addition, I have reviewed and discussed with patient certain preventive protocols, quality metrics, and best practice recommendations. A written personalized care plan for preventive services as well as general preventive health recommendations were provided to patient.     Remi Haggard, LPN   [id number]   After Visit Summary: (MyChart) Due to this being a telephonic visit, the after visit summary with patients personalized plan was offered to patient via MyChart   Nurse Notes:

## 2023-11-13 ENCOUNTER — Other Ambulatory Visit: Payer: Self-pay | Admitting: Internal Medicine

## 2023-11-13 ENCOUNTER — Ambulatory Visit: Payer: PPO | Admitting: Sports Medicine

## 2023-11-13 VITALS — BP 124/78 | HR 61 | Ht 73.0 in | Wt 191.0 lb

## 2023-11-13 DIAGNOSIS — S46211A Strain of muscle, fascia and tendon of other parts of biceps, right arm, initial encounter: Secondary | ICD-10-CM | POA: Diagnosis not present

## 2023-11-13 DIAGNOSIS — K219 Gastro-esophageal reflux disease without esophagitis: Secondary | ICD-10-CM

## 2023-11-13 NOTE — Patient Instructions (Addendum)
Tylenol and Voltaren gel as needed Shoulder HEP  Activity as tolerated As needed follow up

## 2023-11-13 NOTE — Progress Notes (Signed)
Jonathan Young D.[facility] [address] [place] [address] Phone: [phone]   Assessment and Plan:     1. Biceps strain, right, initial encounter -Acute, uncomplicated, initial sports medicine visit - Most consistent with proximal biceps tendon strain likely occurring due to physical activity at the gym on 11/11/2023 - Thankfully no specific MOI, no weakness or red flags on physical exam, and patient is already improving with relative rest, as needed Tylenol use - May continue use Tylenol and Voltaren gel topically as needed - Start HEP for shoulder focusing on biceps - May continue physical activity as tolerated  Pertinent previous records reviewed include none  Follow Up: As needed if no improvement or worsening of symptoms.  Could consider ultrasound versus NSAID course versus CSI if no improvement   Subjective:   I, Jonathan Young, am serving as a Neurosurgeon for Doctor Richardean Sale  Chief Complaint: right arm pain   HPI:   11/13/23 Patient is a 72 year old male with concerns of right arm pain. Patient states he woke up yesterday morning with pain. Thinks he went to hard at the gym Tuesday. Decreased ROM . TTP bicep. No numbness or tingling. Tylenol helped him sleep. Grip strength still intact   Relevant Historical Information: Hypertension, GERD  Additional pertinent review of systems negative.   Current Outpatient Medications:    azithromycin (ZITHROMAX) 250 MG tablet, Take 1 tablet (250 mg total) by mouth daily. Take first 2 tablets together, then 1 every day until finished., Disp: 6 tablet, Rfl: 0   benzonatate (TESSALON) 200 MG capsule, Take 1 capsule (200 mg total) by mouth 3 (three) times daily as needed., Disp: 20 capsule, Rfl: 0   famotidine (PEPCID) 40 MG tablet, Take 1 tablet (40 mg total) by mouth at bedtime., Disp: 90 tablet, Rfl: 1   mupirocin ointment (BACTROBAN) 2 %, Apply 1 Application topically 2 (two) times  daily. To affected area till better, Disp: 22 g, Rfl: 0   pantoprazole (PROTONIX) 40 MG tablet, TAKE 1 TABLET(40 MG) BY MOUTH DAILY, Disp: 30 tablet, Rfl: 0   rosuvastatin (CRESTOR) 10 MG tablet, TAKE 1 TABLET(10 MG) BY MOUTH DAILY, Disp: 90 tablet, Rfl: 1   Suvorexant (BELSOMRA) 15 MG TABS, Take 1 tablet by mouth at bedtime as needed., Disp: 90 tablet, Rfl: 0   tadalafil (CIALIS) 20 MG tablet, Take 1 tablet (20 mg total) by mouth See admin instructions. Take 20 mg by mouth one hour prior to intercourse- maximum of 20 mg (1 tablet) in 24 hours, Disp: 20 tablet, Rfl: 2   valACYclovir (VALTREX) 1000 MG tablet, Take 0.5 tablets (500 mg total) by mouth daily., Disp: 30 tablet, Rfl: 0   Vitamin D, Ergocalciferol, (DRISDOL) 1.25 MG (50000 UNIT) CAPS capsule, Take 1 capsule (50,000 Units total) by mouth every 7 (seven) days., Disp: 12 capsule, Rfl: 0   Objective:     Vitals:   11/13/23 1256  BP: 124/78  Pulse: 61  SpO2: 96%  Weight: 191 lb (86.6 kg)  Height: 6\' 1"  (1.854 m)      Body mass index is 25.2 kg/m.    Physical Exam:    Gen: Appears well, nad, nontoxic and pleasant Neuro:sensation intact, strength is 5/5 with df/pf/inv/ev, muscle tone wnl Skin: no suspicious lesion or defmority Psych: A&O, appropriate mood and affect  Right shoulder:  No deformity, swelling or muscle wasting No scapular winging FF 180, abd 180, int 0, ext 90 TTP biceps groove NTTP  over the [place], clavicle, ac, coracoid, humerus, deltoid, trapezius, cervical spine Positive speeds, Yergenson, empty can Neg neer, hawkins,  obriens, crossarm, subscap liftoff,  Neg ant drawer, sulcus sign, apprehension Negative Spurling's test bilat FROM of neck    Electronically signed by:  Jonathan Young D.[facility] 1:10 PM 11/13/23

## 2023-11-18 ENCOUNTER — Other Ambulatory Visit: Payer: Self-pay | Admitting: Internal Medicine

## 2023-11-18 DIAGNOSIS — K219 Gastro-esophageal reflux disease without esophagitis: Secondary | ICD-10-CM

## 2023-11-19 DIAGNOSIS — R3912 Poor urinary stream: Secondary | ICD-10-CM | POA: Diagnosis not present

## 2023-11-19 DIAGNOSIS — N401 Enlarged prostate with lower urinary tract symptoms: Secondary | ICD-10-CM | POA: Diagnosis not present

## 2023-11-24 ENCOUNTER — Other Ambulatory Visit: Payer: Self-pay | Admitting: Urology

## 2023-11-28 ENCOUNTER — Other Ambulatory Visit: Payer: Self-pay | Admitting: Internal Medicine

## 2023-11-28 DIAGNOSIS — E559 Vitamin D deficiency, unspecified: Secondary | ICD-10-CM

## 2023-12-09 DIAGNOSIS — L821 Other seborrheic keratosis: Secondary | ICD-10-CM | POA: Diagnosis not present

## 2023-12-09 DIAGNOSIS — L578 Other skin changes due to chronic exposure to nonionizing radiation: Secondary | ICD-10-CM | POA: Diagnosis not present

## 2023-12-09 DIAGNOSIS — Z85828 Personal history of other malignant neoplasm of skin: Secondary | ICD-10-CM | POA: Diagnosis not present

## 2023-12-09 DIAGNOSIS — L814 Other melanin hyperpigmentation: Secondary | ICD-10-CM | POA: Diagnosis not present

## 2023-12-09 DIAGNOSIS — D225 Melanocytic nevi of trunk: Secondary | ICD-10-CM | POA: Diagnosis not present

## 2023-12-14 ENCOUNTER — Other Ambulatory Visit: Payer: Self-pay | Admitting: Internal Medicine

## 2023-12-14 DIAGNOSIS — K219 Gastro-esophageal reflux disease without esophagitis: Secondary | ICD-10-CM

## 2023-12-15 ENCOUNTER — Other Ambulatory Visit: Payer: Self-pay | Admitting: Internal Medicine

## 2023-12-15 ENCOUNTER — Telehealth: Payer: Self-pay

## 2023-12-15 DIAGNOSIS — E559 Vitamin D deficiency, unspecified: Secondary | ICD-10-CM

## 2023-12-15 DIAGNOSIS — E785 Hyperlipidemia, unspecified: Secondary | ICD-10-CM

## 2023-12-15 DIAGNOSIS — E538 Deficiency of other specified B group vitamins: Secondary | ICD-10-CM

## 2023-12-15 DIAGNOSIS — M818 Other osteoporosis without current pathological fracture: Secondary | ICD-10-CM

## 2023-12-15 DIAGNOSIS — F411 Generalized anxiety disorder: Secondary | ICD-10-CM

## 2023-12-15 DIAGNOSIS — Z7252 High risk homosexual behavior: Secondary | ICD-10-CM

## 2023-12-15 DIAGNOSIS — I10 Essential (primary) hypertension: Secondary | ICD-10-CM

## 2023-12-15 DIAGNOSIS — K219 Gastro-esophageal reflux disease without esophagitis: Secondary | ICD-10-CM

## 2023-12-15 DIAGNOSIS — E781 Pure hyperglyceridemia: Secondary | ICD-10-CM

## 2023-12-15 DIAGNOSIS — N183 Chronic kidney disease, stage 3 unspecified: Secondary | ICD-10-CM

## 2023-12-15 NOTE — Telephone Encounter (Signed)
Copied from CRM [id number]. Topic: Clinical - Request for Lab/Test Order >> Dec 15, 2023 11:06 AM Desma Mcgregor wrote: Reason for CRM: Patient stated he is supposed to do fasting labs a few days before his appt on 12/02. There are no orders that I am showing in the notes. He is asking for a full panel and HIV check as well. Asking for an appt this week. CB# [phone]

## 2023-12-16 NOTE — Telephone Encounter (Signed)
Pt states his urologist does check his PSA and that it was checked a few weeks ago. Pt asks that he can come early in the morning this week before leaving out of town?

## 2023-12-18 ENCOUNTER — Other Ambulatory Visit (INDEPENDENT_AMBULATORY_CARE_PROVIDER_SITE_OTHER): Payer: PPO

## 2023-12-18 DIAGNOSIS — I10 Essential (primary) hypertension: Secondary | ICD-10-CM

## 2023-12-18 DIAGNOSIS — M818 Other osteoporosis without current pathological fracture: Secondary | ICD-10-CM

## 2023-12-18 DIAGNOSIS — E559 Vitamin D deficiency, unspecified: Secondary | ICD-10-CM | POA: Diagnosis not present

## 2023-12-18 DIAGNOSIS — F411 Generalized anxiety disorder: Secondary | ICD-10-CM | POA: Diagnosis not present

## 2023-12-18 DIAGNOSIS — E781 Pure hyperglyceridemia: Secondary | ICD-10-CM | POA: Diagnosis not present

## 2023-12-18 DIAGNOSIS — E785 Hyperlipidemia, unspecified: Secondary | ICD-10-CM | POA: Diagnosis not present

## 2023-12-18 DIAGNOSIS — N183 Chronic kidney disease, stage 3 unspecified: Secondary | ICD-10-CM | POA: Diagnosis not present

## 2023-12-18 DIAGNOSIS — Z7252 High risk homosexual behavior: Secondary | ICD-10-CM

## 2023-12-18 DIAGNOSIS — E538 Deficiency of other specified B group vitamins: Secondary | ICD-10-CM

## 2023-12-18 LAB — BASIC METABOLIC PANEL
BUN: 19 mg/dL (ref 6–23)
CO2: 29 meq/L (ref 19–32)
Calcium: 9.1 mg/dL (ref 8.4–10.5)
Chloride: 106 meq/L (ref 96–112)
Creatinine, Ser: 1.16 mg/dL (ref 0.40–1.50)
GFR: 62.73 mL/min (ref >60.00)
Glucose, Bld: 102 mg/dL — ABNORMAL HIGH (ref 70–99)
Potassium: 4.9 meq/L (ref 3.5–5.1)
Sodium: 141 meq/L (ref 135–145)

## 2023-12-18 LAB — HEPATIC FUNCTION PANEL
ALT: 21 U/L (ref 0–53)
AST: 26 U/L (ref 0–37)
Albumin: 4.2 g/dL (ref 3.5–5.2)
Alkaline Phosphatase: 48 U/L (ref 39–117)
Bilirubin, Direct: 0.1 mg/dL (ref 0.0–0.3)
Total Bilirubin: 0.3 mg/dL (ref 0.2–1.2)
Total Protein: 6.6 g/dL (ref 6.0–8.3)

## 2023-12-18 LAB — CBC WITH DIFFERENTIAL/PLATELET
Basophils Absolute: 0 10*3/uL (ref 0.0–0.1)
Basophils Relative: 0.5 % (ref 0.0–3.0)
Eosinophils Absolute: 0.2 10*3/uL (ref 0.0–0.7)
Eosinophils Relative: 4.7 % (ref 0.0–5.0)
HCT: 42 % (ref 39.0–52.0)
Hemoglobin: 14 g/dL (ref 13.0–17.0)
Lymphocytes Relative: 42.1 % (ref 12.0–46.0)
Lymphs Abs: 2.2 10*3/uL (ref 0.7–4.0)
MCHC: 33.2 g/dL (ref 30.0–36.0)
MCV: 97.9 fL (ref 78.0–100.0)
Monocytes Absolute: 0.5 10*3/uL (ref 0.1–1.0)
Monocytes Relative: 10 % (ref 3.0–12.0)
Neutro Abs: 2.3 10*3/uL (ref 1.4–7.7)
Neutrophils Relative %: 42.7 % — ABNORMAL LOW (ref 43.0–77.0)
Platelets: 218 10*3/uL (ref 150.0–400.0)
RBC: 4.29 Mil/uL (ref 4.22–5.81)
RDW: 13.2 % (ref 11.5–15.5)
WBC: 5.3 10*3/uL (ref 4.0–10.5)

## 2023-12-18 LAB — VITAMIN D 25 HYDROXY (VIT D DEFICIENCY, FRACTURES): VITD: 70.07 ng/mL (ref 30.00–100.00)

## 2023-12-18 LAB — TSH: TSH: 2.32 u[IU]/mL (ref 0.35–5.50)

## 2023-12-18 LAB — FOLATE: Folate: >24.2 ng/mL (ref >5.9)

## 2023-12-18 LAB — URINALYSIS, ROUTINE W REFLEX MICROSCOPIC
Bilirubin Urine: NEGATIVE
Hgb urine dipstick: NEGATIVE
Ketones, ur: NEGATIVE
Leukocytes,Ua: NEGATIVE
Nitrite: NEGATIVE
Specific Gravity, Urine: 1.02 (ref 1.000–1.030)
Total Protein, Urine: NEGATIVE
Urine Glucose: NEGATIVE
Urobilinogen, UA: 0.2 (ref 0.0–1.0)
pH: 6 (ref 5.0–8.0)

## 2023-12-18 LAB — LIPID PANEL
Cholesterol: 168 mg/dL (ref 0–200)
HDL: 49.3 mg/dL (ref >39.00)
LDL Cholesterol: 102 mg/dL — ABNORMAL HIGH (ref 0–99)
NonHDL: 118.34
Total CHOL/HDL Ratio: 3
Triglycerides: 82 mg/dL (ref 0.0–149.0)
VLDL: 16.4 mg/dL (ref 0.0–40.0)

## 2023-12-18 LAB — PHOSPHORUS: Phosphorus: 3.6 mg/dL (ref 2.3–4.6)

## 2023-12-18 LAB — VITAMIN B12: Vitamin B-12: 525 pg/mL (ref 211–911)

## 2023-12-19 LAB — HIV ANTIBODY (ROUTINE TESTING W REFLEX): HIV 1&2 Ab, 4th Generation: NONREACTIVE

## 2023-12-20 DIAGNOSIS — H538 Other visual disturbances: Secondary | ICD-10-CM | POA: Diagnosis not present

## 2023-12-23 ENCOUNTER — Other Ambulatory Visit: Payer: Self-pay | Admitting: Internal Medicine

## 2023-12-23 DIAGNOSIS — E785 Hyperlipidemia, unspecified: Secondary | ICD-10-CM

## 2023-12-24 HISTORY — PX: BROW LIFT AND BLEPHAROPLASTY: SHX1271

## 2023-12-25 ENCOUNTER — Encounter: Payer: Self-pay | Admitting: Internal Medicine

## 2023-12-25 ENCOUNTER — Ambulatory Visit: Payer: PPO

## 2023-12-25 ENCOUNTER — Ambulatory Visit: Payer: PPO | Admitting: Internal Medicine

## 2023-12-25 VITALS — BP 142/82 | HR 86 | Temp 97.9°F | Resp 16 | Ht 73.0 in | Wt 193.8 lb

## 2023-12-25 DIAGNOSIS — R053 Chronic cough: Secondary | ICD-10-CM

## 2023-12-25 DIAGNOSIS — R42 Dizziness and giddiness: Secondary | ICD-10-CM

## 2023-12-25 DIAGNOSIS — R911 Solitary pulmonary nodule: Secondary | ICD-10-CM

## 2023-12-25 DIAGNOSIS — I10 Essential (primary) hypertension: Secondary | ICD-10-CM

## 2023-12-25 DIAGNOSIS — E785 Hyperlipidemia, unspecified: Secondary | ICD-10-CM | POA: Diagnosis not present

## 2023-12-25 DIAGNOSIS — R9389 Abnormal findings on diagnostic imaging of other specified body structures: Secondary | ICD-10-CM | POA: Diagnosis not present

## 2023-12-25 DIAGNOSIS — J439 Emphysema, unspecified: Secondary | ICD-10-CM | POA: Diagnosis not present

## 2023-12-25 DIAGNOSIS — E559 Vitamin D deficiency, unspecified: Secondary | ICD-10-CM

## 2023-12-25 DIAGNOSIS — K219 Gastro-esophageal reflux disease without esophagitis: Secondary | ICD-10-CM | POA: Diagnosis not present

## 2023-12-25 DIAGNOSIS — M818 Other osteoporosis without current pathological fracture: Secondary | ICD-10-CM | POA: Diagnosis not present

## 2023-12-25 DIAGNOSIS — Z0001 Encounter for general adult medical examination with abnormal findings: Secondary | ICD-10-CM

## 2023-12-25 MED ORDER — CHOLECALCIFEROL 50 MCG (2000 UT) PO TABS: 1.0000 | ORAL_TABLET | Freq: Every day | ORAL | 1 refills | Status: AC

## 2023-12-25 MED ORDER — PANTOPRAZOLE SODIUM 40 MG PO TBEC: 40.0000 mg | DELAYED_RELEASE_TABLET | Freq: Every day | ORAL | 1 refills | Status: DC

## 2023-12-25 MED ORDER — ROSUVASTATIN CALCIUM 10 MG PO TABS: 10.0000 mg | ORAL_TABLET | Freq: Every day | ORAL | 1 refills | Status: DC

## 2023-12-25 NOTE — Progress Notes (Signed)
 Subjective:  Patient ID: Jonathan Young, male    DOB: January 20, 1951  Age: 73 y.o. MRN: [id number]  CC: Annual Exam, Gastroesophageal Reflux, and Hyperlipidemia   HPI Jonathan Young presents for a CPX and f/up ---   Discussed the use of AI scribe software for clinical note transcription with the patient, who gave verbal consent to proceed.  History of Present Illness   The patient, with a history of hypoglycemia, vertigo, tinnitus, and BPH, presents with a recent episode of visual disturbance. He describes seeing a 'bright burn hole' in his vision while in the shower, followed by significant shaking and intense hunger. He self-diagnosed this as a hypoglycemic incident. An eye examination revealed no abnormalities.  He also reports brittle nails and frequent vertigo, describing a spinning sensation upon standing that has been ongoing for about a year. He has tinnitus, which is particularly noticeable in quiet environments.  The patient has been experiencing urinary issues related to his prostate, including weak urine flow and incomplete emptying. He is scheduled for a prostate procedure to alleviate these symptoms.  He also mentions a chronic cough, particularly when lying down, and fatigue, with a tendency to feel particularly tired around mid-morning. He has been managing his weight and trying to exercise regularly but notes that his breathing is affected during physical activity.       Outpatient Medications Prior to Visit  Medication Sig Dispense Refill   famotidine  (PEPCID ) 40 MG tablet Take 1 tablet (40 mg total) by mouth at bedtime. 90 tablet 1   Suvorexant  (BELSOMRA ) 15 MG TABS Take 1 tablet by mouth at bedtime as needed. 90 tablet 0   tadalafil  (CIALIS ) 20 MG tablet Take 1 tablet (20 mg total) by mouth See admin instructions. Take 20 mg by mouth one hour prior to intercourse- maximum of 20 mg (1 tablet) in 24 hours 20 tablet 2   valACYclovir  (VALTREX ) 1000 MG tablet Take 0.5 tablets  (500 mg total) by mouth daily. 30 tablet 0   pantoprazole  (PROTONIX ) 40 MG tablet TAKE 1 TABLET(40 MG) BY MOUTH DAILY 30 tablet 0   rosuvastatin  (CRESTOR ) 10 MG tablet TAKE 1 TABLET(10 MG) BY MOUTH DAILY 90 tablet 1   Vitamin D , Ergocalciferol , (DRISDOL ) 1.25 MG (50000 UNIT) CAPS capsule TAKE 1 CAPSULE BY MOUTH EVERY 7 DAYS 4 capsule 0   mupirocin  ointment (BACTROBAN ) 2 % Apply 1 Application topically 2 (two) times daily. To affected area till better 22 g 0   No facility-administered medications prior to visit.    ROS Review of Systems  Constitutional:  Positive for fatigue.  HENT:  Positive for tinnitus. Negative for postnasal drip, rhinorrhea, sore throat and voice change.   Respiratory:  Positive for cough. Negative for chest tightness, shortness of breath and wheezing.   Cardiovascular:  Positive for palpitations. Negative for chest pain and leg swelling.  Gastrointestinal: Negative.  Negative for abdominal pain, constipation, diarrhea, nausea and vomiting.  Genitourinary:  Positive for difficulty urinating. Negative for decreased urine volume, dysuria and hematuria.  Musculoskeletal:  Positive for arthralgias. Negative for myalgias.  Skin: Negative.   Neurological:  Negative for dizziness, tremors, weakness, light-headedness and numbness.       Intermittent vertigo for one year  Hematological:  Negative for adenopathy. Does not bruise/bleed easily.  Psychiatric/Behavioral: Negative.      Objective:  BP (!) 142/82 (BP Location: Left Arm, Patient Position: Sitting, Cuff Size: Normal)   Pulse 86   Temp 97.9 F (36.6 C) (Oral)  Resp 16   Ht 6' 1 (1.854 m)   Wt 193 lb 12.8 oz (87.9 kg)   SpO2 96%   BMI 25.57 kg/m   BP Readings from Last 3 Encounters:  12/25/23 (!) 142/82  11/13/23 124/78  10/25/23 113/69    Wt Readings from Last 3 Encounters:  12/25/23 193 lb 12.8 oz (87.9 kg)  11/13/23 191 lb (86.6 kg)  05/08/23 193 lb (87.5 kg)    Physical Exam Vitals reviewed.   Constitutional:      Appearance: Normal appearance.  HENT:     Nose: Nose normal.     Mouth/Throat:     Mouth: Mucous membranes are moist.  Eyes:     General: No scleral icterus.    Extraocular Movements: Extraocular movements intact.     Conjunctiva/sclera: Conjunctivae normal.     Pupils: Pupils are equal, round, and reactive to light.  Cardiovascular:     Rate and Rhythm: Normal rate and regular rhythm.     Heart sounds: No murmur heard. Pulmonary:     Effort: Pulmonary effort is normal.     Breath sounds: No stridor. No wheezing, rhonchi or rales.  Chest:     Comments: EKG - NSR, 66 bpm No LVH, Q waves, or ST/T wave changes  Abdominal:     General: Abdomen is flat.     Palpations: There is no mass.     Tenderness: There is no abdominal tenderness. There is no guarding.     Hernia: No hernia is present.  Musculoskeletal:        General: Normal range of motion.     Cervical back: Neck supple.     Right lower leg: No edema.     Left lower leg: No edema.  Lymphadenopathy:     Cervical: No cervical adenopathy.  Skin:    General: Skin is warm and dry.  Neurological:     General: No focal deficit present.     Mental Status: He is alert and oriented to person, place, and time. Mental status is at baseline.  Psychiatric:        Attention and Perception: Attention normal.        Mood and Affect: Mood is anxious.        Behavior: Behavior normal.        Thought Content: Thought content normal.        Judgment: Judgment normal.     Lab Results  Component Value Date   WBC 5.3 12/18/2023   HGB 14.0 12/18/2023   HCT 42.0 12/18/2023   PLT 218.0 12/18/2023   GLUCOSE 102 (H) 12/18/2023   CHOL 168 12/18/2023   TRIG 82.0 12/18/2023   HDL 49.30 12/18/2023   LDLDIRECT 87.0 04/03/2016   LDLCALC 102 (H) 12/18/2023   ALT 21 12/18/2023   AST 26 12/18/2023   NA 141 12/18/2023   K 4.9 12/18/2023   CL 106 12/18/2023   CREATININE 1.16 12/18/2023   BUN 19 12/18/2023   CO2 29  12/18/2023   TSH 2.32 12/18/2023   PSA 8.1 11/06/2023   INR 1.0 04/18/2021   HGBA1C 5.8 04/03/2016   DG Chest 2 View Result Date: 01/02/2024 CLINICAL DATA:  73 year old male with chronic cough EXAM: CHEST - 2 VIEW COMPARISON:  10/17/2022 FINDINGS: Cardiomediastinal silhouette unchanged in size and contour. No evidence of central vascular congestion. No interlobular septal thickening. Stigmata of emphysema, with increased retrosternal airspace, flattened hemidiaphragms, increased AP diameter, and hyperinflation on the AP view. No pneumothorax or  pleural effusion. Coarsened interstitial markings, with no confluent airspace disease. No acute displaced fracture. Degenerative changes of the spine. IMPRESSION: Emphysema without evidence of acute cardiopulmonary disease Electronically Signed   By: Ami Bellman D.O.   On: 01/02/2024 12:59     Assessment & Plan:   Chronic cough- CXR shows emphysema. Will evaluate with a HR CT scan. -     DG Chest 2 View; Future -     CT CHEST HIGH RESOLUTION; Future  Hyperlipidemia with target LDL less than 130- LDL goal achieved. Doing well on the statin  -     Rosuvastatin  Calcium ; Take 1 tablet (10 mg total) by mouth daily.  Dispense: 90 tablet; Refill: 1  Encounter for general adult medical examination with abnormal findings- Exam completed, labs reviewed, vaccines reviewed and updated, cancer screenings addressed, pt ed material was given.   Gastroesophageal reflux disease without esophagitis -     Pantoprazole  Sodium; Take 1 tablet (40 mg total) by mouth daily.  Dispense: 90 tablet; Refill: 1  IDIOPATHIC OSTEOPOROSIS -     Cholecalciferol ; Take 1 tablet (2,000 Units total) by mouth daily.  Dispense: 90 tablet; Refill: 1  Vitamin D  deficiency -     Cholecalciferol ; Take 1 tablet (2,000 Units total) by mouth daily.  Dispense: 90 tablet; Refill: 1  Essential hypertension- EKG is negative for LVH. -     EKG 12-Lead  Vertigo -     Referral to Neuro  Rehab  Abnormal chest x-ray -     CT CHEST HIGH RESOLUTION; Future     Follow-up: No follow-ups on file.  Debby Molt, MD

## 2023-12-30 ENCOUNTER — Other Ambulatory Visit: Payer: Self-pay | Admitting: Internal Medicine

## 2023-12-30 ENCOUNTER — Encounter: Payer: Self-pay | Admitting: Internal Medicine

## 2023-12-30 DIAGNOSIS — E559 Vitamin D deficiency, unspecified: Secondary | ICD-10-CM

## 2023-12-31 ENCOUNTER — Encounter (HOSPITAL_BASED_OUTPATIENT_CLINIC_OR_DEPARTMENT_OTHER): Payer: Self-pay | Admitting: Urology

## 2023-12-31 NOTE — Progress Notes (Signed)
 Spoke w/ via phone for pre-op interview--- Jonathan Young needs dos---- NONE        Young results------Current EKG in Epic dated 12/25/23 COVID test -----patient states asymptomatic no test needed Arrive at -------0530 NPO after MN NO Solid Food.   Med rec completed Medications to take morning of surgery -----Protonix  and Valtrex  Diabetic medication ----- Patient instructed no nail polish to be worn day of surgery Patient instructed to bring photo id and insurance card day of surgery Patient aware to have Driver (ride ) / caregiver    for 24 hours after surgery - Friend Jonathan Young Patient Special Instructions ----- Pre-Op special Instructions ----- Patient verbalized understanding of instructions that were given at this phone interview. Patient denies chest pain, sob, fever, cough at the interview.

## 2024-01-02 ENCOUNTER — Encounter: Payer: Self-pay | Admitting: Internal Medicine

## 2024-01-02 DIAGNOSIS — R9389 Abnormal findings on diagnostic imaging of other specified body structures: Secondary | ICD-10-CM | POA: Insufficient documentation

## 2024-01-06 ENCOUNTER — Encounter: Payer: Self-pay | Admitting: Internal Medicine

## 2024-01-06 ENCOUNTER — Ambulatory Visit (HOSPITAL_BASED_OUTPATIENT_CLINIC_OR_DEPARTMENT_OTHER): Admission: RE | Admit: 2024-01-06 | Payer: PPO | Source: Home / Self Care | Admitting: Urology

## 2024-01-06 SURGERY — TURP (TRANSURETHRAL RESECTION OF PROSTATE)
Anesthesia: General

## 2024-01-08 ENCOUNTER — Other Ambulatory Visit: Payer: Self-pay | Admitting: Internal Medicine

## 2024-01-08 DIAGNOSIS — Z4889 Encounter for other specified surgical aftercare: Secondary | ICD-10-CM | POA: Diagnosis not present

## 2024-01-08 DIAGNOSIS — E559 Vitamin D deficiency, unspecified: Secondary | ICD-10-CM

## 2024-01-12 ENCOUNTER — Other Ambulatory Visit: Payer: Self-pay

## 2024-01-12 ENCOUNTER — Ambulatory Visit: Payer: PPO | Attending: Internal Medicine | Admitting: Physical Therapy

## 2024-01-12 ENCOUNTER — Encounter: Payer: Self-pay | Admitting: Physical Therapy

## 2024-01-12 DIAGNOSIS — R42 Dizziness and giddiness: Secondary | ICD-10-CM | POA: Insufficient documentation

## 2024-01-12 DIAGNOSIS — R2681 Unsteadiness on feet: Secondary | ICD-10-CM | POA: Diagnosis not present

## 2024-01-12 NOTE — Therapy (Signed)
OUTPATIENT PHYSICAL THERAPY VESTIBULAR EVALUATION     Patient Name: Jonathan Young MRN: [id number] DOB:1951/03/12, 73 y.o., male Today's Date: 01/12/2024  END OF SESSION:  PT End of Session - 01/12/24 1008     Visit Number 1    Number of Visits 10    Date for PT Re-Evaluation 02/13/24    Authorization Type HTA-no VL    PT Start Time 1005    PT Stop Time 1055    PT Time Calculation (min) 50 min    Activity Tolerance Patient tolerated treatment well    Behavior During Therapy WFL for tasks assessed/performed             Past Medical History:  Diagnosis Date   Complication of anesthesia    sore throat from tube   Dysrhythmia    PVC'S   GERD (gastroesophageal reflux disease)    Hypercholesteremia    Osteoarthritis    Umbilical hernia    Past Surgical History:  Procedure Laterality Date   CARPAL TUNNEL RELEASE Left 05/23/2018   CARPAL TUNNEL RELEASE Right 11/22/2018   CERVICAL SPINE SURGERY     HEMORRHOID SURGERY  1982   INSERTION OF MESH N/A 07/27/2020   Procedure: INSERTION OF MESH;  Surgeon: Harriette Bouillon, MD;  Location: [facility];  Service: General;  Laterality: N/A;   SHOULDER SURGERY     BIL   TONSILLECTOMY AND ADENOIDECTOMY     TOTAL HIP ARTHROPLASTY     RT TOAL HIP   TOTAL HIP ARTHROPLASTY  06/01/2012   Procedure: TOTAL HIP ARTHROPLASTY ANTERIOR APPROACH;  Surgeon: Shelda Pal, MD;  Location: [facility];  Service: Orthopedics;  Laterality: Left;   UMBILICAL HERNIA REPAIR N/A 07/27/2020   Procedure: UMBILICAL HERNIA REPAIR WITH MESH;  Surgeon: Harriette Bouillon, MD;  Location: [facility];  Service: General;  Laterality: N/A;   Patient Active Problem List   Diagnosis Date Noted   Abnormal chest x-ray 01/02/2024   Vertigo 12/25/2023   Encounter for general adult medical examination with abnormal findings 05/09/2023   Chronic cough 05/08/2023   LPRD (laryngopharyngeal reflux disease) 05/08/2023   Patellofemoral arthritis of right  knee 08/19/2022   Psychophysiological insomnia 06/06/2020   Nodule of lower lobe of right lung 02/14/2020   Maxillary sinusitis, chronic 06/29/2017   CRI (chronic renal insufficiency), stage 3 (moderate) (HCC) 04/07/2017   Pure hyperglyceridemia 04/03/2017   Essential hypertension 11/07/2016   GAD (generalized anxiety disorder) 10/17/2015   Erectile dysfunction of organic origin 10/10/2015   PSA elevation 01/10/2015   GERD (gastroesophageal reflux disease) 04/23/2011   IBS (irritable bowel syndrome) 04/23/2011   Vitamin D deficiency 01/16/2011   DUPUYTREN'S CONTRACTURE 01/16/2011   Hypogonadism male 07/25/2010   IDIOPATHIC OSTEOPOROSIS 11/01/2008   Vitamin B 12 deficiency 03/22/2008   Osteoarthritis 08/25/2007   Hyperlipidemia with target LDL less than 130 08/03/2007    PCP: Etta Grandchild, MD REFERRING PROVIDER: Etta Grandchild, MD  REFERRING DIAG: R42 (ICD-10-CM) - Vertigo   THERAPY DIAG:  Dizziness and giddiness  Unsteadiness on feet  ONSET DATE: 12/25/2023 (MD referral)  Rationale for Evaluation and Treatment: Rehabilitation  SUBJECTIVE:   SUBJECTIVE STATEMENT: Have had some dizziness and vertigo.  Not all the time, sometimes getting up from the bed at night.  Haven't had it in a while.  Not sure if it has to do with my congestion. Pt accompanied by: self  PERTINENT HISTORY: hypoglycemia, vertigo, tinnitus, and BPH, presents to MD visit 12/25/23 with a  recent episode of visual disturbance. Hx of tinnitus, chronic cough (CXR shows emphysema), GERD, osteoporosis; hx of neck fusion  PAIN:  Are you having pain? No  PRECAUTIONS: Fall  RED FLAGS: None   WEIGHT BEARING RESTRICTIONS: No  FALLS: Has patient fallen in last 6 months? No  LIVING ENVIRONMENT: Lives with: lives with their spouse Lives in: House/apartment Stairs:  5-6 steps into home Has following equipment at home: None  PLOF: Independent  PATIENT GOALS: To figure out about this  dizziness  OBJECTIVE:  Note: Objective measures were completed at Evaluation unless otherwise noted.  DIAGNOSTIC FINDINGS: NA for vertigo  COGNITION: Overall cognitive status: Within functional limits for tasks assessed   POSTURE:  rounded shoulders  Cervical ROM:    Active A/PROM (deg) eval  Flexion 30  Extension 30  Right lateral flexion   Left lateral flexion   Right rotation 60  Left rotation 50  (Blank rows = not tested)   BED MOBILITY:  Independent  TRANSFERS: Assistive device utilized: None  Sit to stand: Complete Independence Stand to sit: Complete Independence  GAIT: Gait pattern: WFL  PATIENT SURVEYS:  FOTO 48, predicted 59  VESTIBULAR ASSESSMENT:  GENERAL OBSERVATION: No acute distress   SYMPTOM BEHAVIOR:  Subjective history: Symptoms going on for several years-getting up from reclined position-total dizziness, spinning. Does have congestion, manages with OTC meds  Non-Vestibular symptoms: tinnitus and migraine symptoms  Type of dizziness: Imbalance (Disequilibrium), Spinning/Vertigo, and Unsteady with head/body turns  Frequency: about 6 times/month  Duration: seconds  Aggravating factors: Induced by position change: supine to sit and Induced by motion: looking up at the ceiling, bending down to the ground, and turning head quickly  Relieving factors: head stationary and slow movements  Progression of symptoms: unchanged  OCULOMOTOR EXAM: (Wears progressive lenses)  Ocular Alignment: abnormal-R eye slightly lower than L  Ocular ROM: No Limitations  Spontaneous Nystagmus: absent  Gaze-Induced Nystagmus: absent  Smooth Pursuits: intact  Saccades: intact  Convergence/Divergence: 3 inches    VESTIBULAR - OCULAR REFLEX:   Slow VOR: Normal  VOR Cancellation: Normal  Head-Impulse Test: HIT Right: negative HIT Left: negative  Dynamic Visual Acuity:  NT   POSITIONAL TESTING: Right Dix-Hallpike: upbeating, right nystagmus and Duration: 15  sec Left Dix-Hallpike: no nystagmus Right Roll Test: no nystagmus Left Roll Test: no nystagmus    M-CTSIB  Condition 1: Firm Surface, EO 30 Sec, Normal Sway  Condition 2: Firm Surface, EC 30 Sec, Mild Sway  Condition 3: Foam Surface, EO 30 Sec, Mild Sway  Condition 4: Foam Surface, EC  30 Sec, Moderate Sway                                                                                                                                TREATMENT DATE: 01/12/2024   Canalith Repositioning:  Epley Right: Number of Reps: 1, Response to Treatment: symptoms resolved, and Comment: mild dizziness in position 2 of R  Epely Upon reassessment of R DH:  No symptoms, no nystagmus.  PATIENT EDUCATION: Education details: Eval results, POC; post CRM care (good hydration, rest, may notice fatigue) Person educated: Patient Education method: Explanation Education comprehension: verbalized understanding  HOME EXERCISE PROGRAM:  GOALS: Goals reviewed with patient? Yes  SHORT TERM GOALS: = LTGs   LONG TERM GOALS: Target date: 02/13/2024  Pt will be independent with HEP for improved dizziness, balance. Baseline:  Goal status: INITIAL  2.  Pt will report no dizziness with bed mobility, coming up from reclined position. Baseline:  Goal status: INITIAL  3.  Pt will improve FOTO score to at least 59 to demonstrate improved overall functional mobility. Baseline: 48 Goal status: INITIAL  4.  Pt will perform Condition 4 on MCTSIB with mild sway for improved overall balance.  Baseline: mod sway Goal status: INITIAL  ASSESSMENT:  CLINICAL IMPRESSION: Patient is a 73 y.o. male who was seen today for physical therapy evaluation and treatment for vertigo.  He reports several years of intermittent bouts of dizziness when he comes up from supine position in bed at night or when he comes up from dentist chair.  These episodes last seconds; he does report noting about 6 episodes in a month.  He reports  no history of falls or recent illnesses.   Oculomotor testing and VOR testing is largely unremarkable.  He does have R eye slightly lower than L; convergence is about 3 inches.  With MCTSIB testing, pt has mod sway with Condition 4 (EC and compliant surface).  With positional testing, pt has R rotational upbeating nystagmus with R Dix-Hallpike, indicating R posterior canal BPPV.  Negative L DH and roll tests noted.  Performed Epley for R side x 1 and pt reports no dizziness upon sitting; with restest of R DH, pt was negative for nystagmus and symptoms.  Pt will benefit from skilled PT to fully resolve dizziness and address high level balance concerns for optimal functional mobility.  OBJECTIVE IMPAIRMENTS: decreased balance and dizziness.   ACTIVITY LIMITATIONS: bending, standing, and bed mobility  PARTICIPATION LIMITATIONS: community activity  PERSONAL FACTORS: 3+ comorbidities: see above  are also affecting patient's functional outcome.   REHAB POTENTIAL: Good  CLINICAL DECISION MAKING: Stable/uncomplicated  EVALUATION COMPLEXITY: Low   PLAN:  PT FREQUENCY: 1-2x/week  PT DURATION: other: 5 weeks  PLANNED INTERVENTIONS: 97110-Therapeutic exercises, 97530- Therapeutic activity, 97112- Neuromuscular re-education, 97535- Self Care, [id number]- Manual therapy, [phone]- Canalith repositioning, Patient/Family education, Balance training, and Vestibular training  PLAN FOR NEXT SESSION: Reassess positional vertigo and treat as needed; initiate HEP with Brandt-Daroff   Tierany Appleby W., PT 01/12/2024, 11:01 AM   [facility] at [facility] [address], [address] [place], [place] [address] Phone # [phone] Fax # [phone]

## 2024-01-13 ENCOUNTER — Other Ambulatory Visit: Payer: Self-pay | Admitting: Urology

## 2024-01-14 ENCOUNTER — Ambulatory Visit: Payer: Self-pay | Admitting: Physical Therapy

## 2024-01-14 ENCOUNTER — Encounter: Payer: Self-pay | Admitting: Physical Therapy

## 2024-01-14 DIAGNOSIS — R42 Dizziness and giddiness: Secondary | ICD-10-CM | POA: Diagnosis not present

## 2024-01-14 NOTE — Therapy (Signed)
OUTPATIENT PHYSICAL THERAPY TREATMENT NOTE     Patient Name: Jonathan Young MRN: [id number] DOB:11-Aug-1951, 73 y.o., male Today's Date: 01/14/2024  END OF SESSION:  PT End of Session - 01/14/24 1642     Visit Number 2    Number of Visits 10    Date for PT Re-Evaluation 02/13/24    Authorization Type HTA-no VL    PT Start Time 1617    PT Stop Time 1632    PT Time Calculation (min) 15 min    Activity Tolerance Patient tolerated treatment well    Behavior During Therapy WFL for tasks assessed/performed              Past Medical History:  Diagnosis Date   Complication of anesthesia    sore throat from tube   Dysrhythmia    PVC'S   GERD (gastroesophageal reflux disease)    Hypercholesteremia    Osteoarthritis    Umbilical hernia    Past Surgical History:  Procedure Laterality Date   CARPAL TUNNEL RELEASE Left 05/23/2018   CARPAL TUNNEL RELEASE Right 11/22/2018   CERVICAL SPINE SURGERY     HEMORRHOID SURGERY  1982   INSERTION OF MESH N/A 07/27/2020   Procedure: INSERTION OF MESH;  Surgeon: Harriette Bouillon, MD;  Location: [facility];  Service: General;  Laterality: N/A;   SHOULDER SURGERY     BIL   TONSILLECTOMY AND ADENOIDECTOMY     TOTAL HIP ARTHROPLASTY     RT TOAL HIP   TOTAL HIP ARTHROPLASTY  06/01/2012   Procedure: TOTAL HIP ARTHROPLASTY ANTERIOR APPROACH;  Surgeon: Shelda Pal, MD;  Location: [facility];  Service: Orthopedics;  Laterality: Left;   UMBILICAL HERNIA REPAIR N/A 07/27/2020   Procedure: UMBILICAL HERNIA REPAIR WITH MESH;  Surgeon: Harriette Bouillon, MD;  Location: [facility];  Service: General;  Laterality: N/A;   Patient Active Problem List   Diagnosis Date Noted   Abnormal chest x-ray 01/02/2024   Vertigo 12/25/2023   Encounter for general adult medical examination with abnormal findings 05/09/2023   Chronic cough 05/08/2023   LPRD (laryngopharyngeal reflux disease) 05/08/2023   Patellofemoral arthritis of right knee  08/19/2022   Psychophysiological insomnia 06/06/2020   Nodule of lower lobe of right lung 02/14/2020   Maxillary sinusitis, chronic 06/29/2017   CRI (chronic renal insufficiency), stage 3 (moderate) (HCC) 04/07/2017   Pure hyperglyceridemia 04/03/2017   Essential hypertension 11/07/2016   GAD (generalized anxiety disorder) 10/17/2015   Erectile dysfunction of organic origin 10/10/2015   PSA elevation 01/10/2015   GERD (gastroesophageal reflux disease) 04/23/2011   IBS (irritable bowel syndrome) 04/23/2011   Vitamin D deficiency 01/16/2011   DUPUYTREN'S CONTRACTURE 01/16/2011   Hypogonadism male 07/25/2010   IDIOPATHIC OSTEOPOROSIS 11/01/2008   Vitamin B 12 deficiency 03/22/2008   Osteoarthritis 08/25/2007   Hyperlipidemia with target LDL less than 130 08/03/2007    PCP: Etta Grandchild, MD REFERRING PROVIDER: Etta Grandchild, MD  REFERRING DIAG: R42 (ICD-10-CM) - Vertigo   THERAPY DIAG:  Dizziness and giddiness  ONSET DATE: 12/25/2023 (MD referral)  Rationale for Evaluation and Treatment: Rehabilitation  SUBJECTIVE:   SUBJECTIVE STATEMENT: Feel better, no symptoms at all since the last visit.    Pt accompanied by: self  PERTINENT HISTORY: hypoglycemia, vertigo, tinnitus, and BPH, presents to MD visit 12/25/23 with a recent episode of visual disturbance. Hx of tinnitus, chronic cough (CXR shows emphysema), GERD, osteoporosis; hx of neck fusion  PAIN:  Are you having pain? No  PRECAUTIONS: Fall  RED FLAGS: None   WEIGHT BEARING RESTRICTIONS: No  FALLS: Has patient fallen in last 6 months? No  LIVING ENVIRONMENT: Lives with: lives with their spouse Lives in: House/apartment Stairs:  5-6 steps into home Has following equipment at home: None  PLOF: Independent  PATIENT GOALS: To figure out about this dizziness  OBJECTIVE:    TODAY'S TREATMENT: 01/14/2024 Activity Comments  L DH negative  RDH Negative  L roll Negative  R roll Negative  Brandt-Daroff R and  L No symptoms, no nystagmus  FOTO 70 (improved from 48)   PATIENT EDUCATION: Education details: Progress with CRM maneuver from last visit, no BPPV noted today.  Explained rationale for Brandt-Daroff, but did not give as HEP, as pt did not have any symptoms today, or since eval. Person educated: Patient Education method: Explanation Education comprehension: verbalized understanding ----------------------------------------------- Note: Objective measures were completed at Evaluation unless otherwise noted.  DIAGNOSTIC FINDINGS: NA for vertigo  COGNITION: Overall cognitive status: Within functional limits for tasks assessed   POSTURE:  rounded shoulders  Cervical ROM:    Active A/PROM (deg) eval  Flexion 30  Extension 30  Right lateral flexion   Left lateral flexion   Right rotation 60  Left rotation 50  (Blank rows = not tested)   BED MOBILITY:  Independent  TRANSFERS: Assistive device utilized: None  Sit to stand: Complete Independence Stand to sit: Complete Independence  GAIT: Gait pattern: WFL  PATIENT SURVEYS:  FOTO 48, predicted 59  VESTIBULAR ASSESSMENT:  GENERAL OBSERVATION: No acute distress   SYMPTOM BEHAVIOR:  Subjective history: Symptoms going on for several years-getting up from reclined position-total dizziness, spinning. Does have congestion, manages with OTC meds  Non-Vestibular symptoms: tinnitus and migraine symptoms  Type of dizziness: Imbalance (Disequilibrium), Spinning/Vertigo, and Unsteady with head/body turns  Frequency: about 6 times/month  Duration: seconds  Aggravating factors: Induced by position change: supine to sit and Induced by motion: looking up at the ceiling, bending down to the ground, and turning head quickly  Relieving factors: head stationary and slow movements  Progression of symptoms: unchanged  OCULOMOTOR EXAM: (Wears progressive lenses)  Ocular Alignment: abnormal-R eye slightly lower than L  Ocular ROM: No  Limitations  Spontaneous Nystagmus: absent  Gaze-Induced Nystagmus: absent  Smooth Pursuits: intact  Saccades: intact  Convergence/Divergence: 3 inches    VESTIBULAR - OCULAR REFLEX:   Slow VOR: Normal  VOR Cancellation: Normal  Head-Impulse Test: HIT Right: negative HIT Left: negative  Dynamic Visual Acuity:  NT   POSITIONAL TESTING: Right Dix-Hallpike: upbeating, right nystagmus and Duration: 15 sec Left Dix-Hallpike: no nystagmus Right Roll Test: no nystagmus Left Roll Test: no nystagmus    M-CTSIB  Condition 1: Firm Surface, EO 30 Sec, Normal Sway  Condition 2: Firm Surface, EC 30 Sec, Mild Sway  Condition 3: Foam Surface, EO 30 Sec, Mild Sway  Condition 4: Foam Surface, EC  30 Sec, Moderate Sway  TREATMENT DATE: 01/12/2024   Canalith Repositioning:  Epley Right: Number of Reps: 1, Response to Treatment: symptoms resolved, and Comment: mild dizziness in position 2 of R Epely Upon reassessment of R DH:  No symptoms, no nystagmus.  PATIENT EDUCATION: Education details: Eval results, POC; post CRM care (good hydration, rest, may notice fatigue) Person educated: Patient Education method: Explanation Education comprehension: verbalized understanding  HOME EXERCISE PROGRAM:  GOALS: Goals reviewed with patient? Yes  SHORT TERM GOALS: = LTGs   LONG TERM GOALS: Target date: 02/13/2024  Pt will be independent with HEP for improved dizziness, balance. Baseline: NA Goal status: IN PROGRESS  2.  Pt will report no dizziness with bed mobility, coming up from reclined position. Baseline: 0/10 dizziness Goal status: MET, 01/14/2024  3.  Pt will improve FOTO score to at least 59 to demonstrate improved overall functional mobility. Baseline: 48>70 01/14/2024 Goal status: MET  4.  Pt will perform Condition 4 on MCTSIB with mild sway for improved  overall balance.  Baseline: mod sway Goal status: IN PROGRESS  ASSESSMENT:  CLINICAL IMPRESSION: Pt returns today for first visit since eval.  He reports he has had no dizziness symptoms since CRM at eval last visit.  With positional testing today, pt has no nystagmus and no symptoms in any position.  Explained/demo Brandt-Daroff, but did not give to pt for HEP, as no symptoms in any position today.  Pt feels his balance is good without any difficulty.  He is agreeable to holding chart open for 30 days if pt has another episode of vertigo to return to therapy during that time; otherwise, plan for d/c after 30 days.  OBJECTIVE IMPAIRMENTS: decreased balance and dizziness.   ACTIVITY LIMITATIONS: bending, standing, and bed mobility  PARTICIPATION LIMITATIONS: community activity  PERSONAL FACTORS: 3+ comorbidities: see above  are also affecting patient's functional outcome.   REHAB POTENTIAL: Good  CLINICAL DECISION MAKING: Stable/uncomplicated  EVALUATION COMPLEXITY: Low   PLAN:  PT FREQUENCY: 1-2x/week  PT DURATION: other: 5 weeks  PLANNED INTERVENTIONS: 97110-Therapeutic exercises, 97530- Therapeutic activity, [id number]- Neuromuscular re-education, 97535- Self Care, [id number]- Manual therapy, [phone]- Canalith repositioning, Patient/Family education, Balance training, and Vestibular training  PLAN FOR NEXT SESSION: Hold chart open for 30 days and discharge after that if pt has not returned.  Gean Maidens., PT 01/14/2024, 4:42 PM    Outpatient Rehab at [facility] [address] [place], [address] [place], [place] [address] Phone # [phone] Fax # [phone]

## 2024-02-04 ENCOUNTER — Other Ambulatory Visit: Payer: PPO

## 2024-02-10 DIAGNOSIS — H02831 Dermatochalasis of right upper eyelid: Secondary | ICD-10-CM | POA: Diagnosis not present

## 2024-02-10 DIAGNOSIS — H02834 Dermatochalasis of left upper eyelid: Secondary | ICD-10-CM | POA: Diagnosis not present

## 2024-02-10 DIAGNOSIS — H02401 Unspecified ptosis of right eyelid: Secondary | ICD-10-CM | POA: Diagnosis not present

## 2024-02-10 DIAGNOSIS — Z01818 Encounter for other preprocedural examination: Secondary | ICD-10-CM | POA: Diagnosis not present

## 2024-02-10 DIAGNOSIS — H53453 Other localized visual field defect, bilateral: Secondary | ICD-10-CM | POA: Diagnosis not present

## 2024-02-16 ENCOUNTER — Encounter (HOSPITAL_COMMUNITY): Payer: Self-pay

## 2024-02-16 NOTE — Progress Notes (Signed)
 COVID Vaccine Completed:  Yes  Date of COVID positive in last 90 days:  PCP - Sanda Linger, MD Cardiologist -   Chest x-ray - 12-25-23 Epic EKG - 12-25-23 Epic Stress Test - 10-20-19 Epic ECHO -  Cardiac Cath -  Pacemaker/ICD device last checked: Spinal Cord Stimulator: Cardiac CT - 05-15-23 Epic  Bowel Prep -   Sleep Study -  CPAP -   Fasting Blood Sugar -  Checks Blood Sugar _____ times a day  Last dose of GLP1 agonist-  N/A GLP1 instructions:  Hold 7 days before surgery    Last dose of SGLT-2 inhibitors-  N/A SGLT-2 instructions:  Hold 3 days before surgery    Blood Thinner Instructions:  Last dose:   Time: Aspirin Instructions: Last Dose:  Activity level:  Can go up a flight of stairs and perform activities of daily living without stopping and without symptoms of chest pain or shortness of breath.  Able to exercise without symptoms  Unable to go up a flight of stairs without symptoms of     Anesthesia review:   Patient denies shortness of breath, fever, cough and chest pain at PAT appointment  Patient verbalized understanding of instructions that were given to them at the PAT appointment. Patient was also instructed that they will need to review over the PAT instructions again at home before surgery.

## 2024-02-16 NOTE — Patient Instructions (Signed)
 SURGICAL WAITING ROOM VISITATION Patients having surgery or a procedure may have no more than 2 support people in the waiting area - these visitors may rotate.    Children under the age of 55 must have an adult with them who is not the patient.  Due to an increase in RSV and influenza rates and associated hospitalizations, children ages 19 and under may not visit patients in [facility] hospitals.   If the patient needs to stay at the hospital during part of their recovery, the visitor guidelines for inpatient rooms apply. Pre-op nurse will coordinate an appropriate time for 1 support person to accompany patient in pre-op.  This support person may not rotate.    Please refer to the [facility] website for the visitor guidelines for Inpatients (after your surgery is over and you are in a regular room).       Your procedure is scheduled on: 02-27-24   Report to [facility] Main Entrance    Report to admitting at 5:15 AM   Call this number if you have problems the morning of surgery [phone]   Do not eat food or drink liquids :After Midnight.          If you have questions, please contact your surgeon's office.   FOLLOW  ANY ADDITIONAL PRE OP INSTRUCTIONS YOU RECEIVED FROM YOUR SURGEON'S OFFICE!!!     Oral Hygiene is also important to reduce your risk of infection.                                    Remember - BRUSH YOUR TEETH THE MORNING OF SURGERY WITH YOUR REGULAR TOOTHPASTE   Do NOT smoke after Midnight   Take these medicines the morning of surgery with A SIP OF WATER:     Pantoprazole   Rosuvastatin   Valacyclovir   Tramadol if needed   Okay to use eyedrops  Stop all vitamins and herbal supplements 7 days before surgery  Bring CPAP mask and tubing day of surgery.                              You may not have any metal on your body including  jewelry, and body piercing             Do not wear lotions, powders, cologne, or deodorant              Men may  shave face and neck.   Do not bring valuables to the hospital. [facility] IS NOT RESPONSIBLE   FOR VALUABLES.   Contacts, dentures or bridgework may not be worn into surgery.  DO NOT BRING YOUR HOME MEDICATIONS TO THE HOSPITAL. PHARMACY WILL DISPENSE MEDICATIONS LISTED ON YOUR MEDICATION LIST TO YOU DURING YOUR ADMISSION IN THE HOSPITAL!    Patients discharged on the day of surgery will not be allowed to drive home.  Someone NEEDS to stay with you for the first 24 hours after anesthesia.   Special Instructions: Bring a copy of your healthcare power of attorney and living will documents the day of surgery if you haven't scanned them before.              Please read over the following fact sheets you were given: IF YOU HAVE QUESTIONS ABOUT YOUR PRE-OP INSTRUCTIONS PLEASE CALL [phone] G[facility]  If you received a COVID  test during your pre-op visit  it is requested that you wear a mask when out in public, stay away from anyone that may not be feeling well and notify your surgeon if you develop symptoms. If you test positive for Covid or have been in contact with anyone that has tested positive in the last 10 days please notify you surgeon.  [place] - Preparing for Surgery Before surgery, you can play an important role.  Because skin is not sterile, your skin needs to be as free of germs as possible.  You can reduce the number of germs on your skin by washing with CHG (chlorahexidine gluconate) soap before surgery.  CHG is an antiseptic cleaner which kills germs and bonds with the skin to continue killing germs even after washing. Please DO NOT use if you have an allergy to CHG or antibacterial soaps.  If your skin becomes reddened/irritated stop using the CHG and inform your nurse when you arrive at Short Stay. Do not shave (including legs and underarms) for at least 48 hours prior to the first CHG shower.  You may shave your face/neck.  Please follow these instructions carefully:  1.   Shower with CHG Soap the night before surgery and the  morning of surgery.  2.  If you choose to wash your hair, wash your hair first as usual with your normal  shampoo.  3.  After you shampoo, rinse your hair and body thoroughly to remove the shampoo.                             4.  Use CHG as you would any other liquid soap.  You can apply chg directly to the skin and wash.  Gently with a scrungie or clean washcloth.  5.  Apply the CHG Soap to your body ONLY FROM THE NECK DOWN.   Do   not use on face/ open                           Wound or open sores. Avoid contact with eyes, ears mouth and   genitals (private parts).                       Wash face,  Genitals (private parts) with your normal soap.             6.  Wash thoroughly, paying special attention to the area where your    surgery  will be performed.  7.  Thoroughly rinse your body with warm water from the neck down.  8.  DO NOT shower/wash with your normal soap after using and rinsing off the CHG Soap.                9.  Pat yourself dry with a clean towel.            10.  Wear clean pajamas.            11.  Place clean sheets on your bed the night of your first shower and do not  sleep with pets. Day of Surgery : Do not apply any lotions/deodorants the morning of surgery.  Please wear clean clothes to the hospital/surgery center.  FAILURE TO FOLLOW THESE INSTRUCTIONS MAY RESULT IN THE CANCELLATION OF YOUR SURGERY  PATIENT SIGNATURE_________________________________  NURSE SIGNATURE__________________________________  ________________________________________________________________________

## 2024-02-18 ENCOUNTER — Encounter (HOSPITAL_COMMUNITY)
Admission: RE | Admit: 2024-02-18 | Discharge: 2024-02-18 | Disposition: A | Discharge status: Home / Self Care | Payer: PPO | Source: Ambulatory Visit | Attending: Urology | Admitting: Urology

## 2024-02-18 DIAGNOSIS — I10 Essential (primary) hypertension: Secondary | ICD-10-CM

## 2024-02-18 DIAGNOSIS — Z01818 Encounter for other preprocedural examination: Secondary | ICD-10-CM

## 2024-02-18 HISTORY — DX: Dizziness and giddiness: R42

## 2024-02-24 NOTE — Progress Notes (Signed)
 COVID Vaccine received:  []  No [x]  Yes Date of any COVID positive Test in last 90 days:  PCP - Sanda Linger, MD Cardiologist -   Chest x-ray - 12-25-2023    2v   Epic EKG -  12-25-2023  Epic Stress Test - 10-20-2019  Epic ECHO -  Cardiac Cath -  Cardiac CT - score 3.2 on 05-15-2023  Epic  PCR screen: []  Ordered & Completed []   No Order but Needs PROFEND     [x]   N/A for this surgery  Surgery Plan:  [x]  Ambulatory   []  Outpatient in bed  []  Admit Anesthesia:    [x]  General  []  Spinal  []   Choice []   MAC  Bowel Prep - [x]  No  []   Yes ______  Pacemaker / ICD device [x]  No []  Yes   Spinal Cord Stimulator:[x]  No []  Yes       History of Sleep Apnea? []  No [x]  Yes  study 2013 CPAP used?- []  No []  Yes    Does the patient monitor blood sugar?   [x]  N/A   []  No []  Yes  Patient has: [x]  NO Hx DM   []  Pre-DM   []  DM1  []   DM2  Blood Thinner / Instructions:none Aspirin Instructions:  none  ERAS Protocol Ordered: [x]  No  []  Yes Patient is to be NPO after: midnight prior  Dental hx: []  Dentures:  []  N/A      []  Bridge or Partial:                   []  Loose or Damaged teeth:   Comments:   Activity level: Patient is able / unable to climb a flight of stairs without difficulty; []  No CP  []  No SOB, but would have ___   Patient can / can not perform ADLs without assistance.   Anesthesia review: HTN, GERD, GAD, CKD  Patient denies shortness of breath, fever, cough and chest pain at PAT appointment.  Patient verbalized understanding and agreement to the Pre-Surgical Instructions that were given to them at this PAT appointment. Patient was also educated of the need to review these PAT instructions again prior to his surgery.I reviewed the appropriate phone numbers to call if they have any and questions or concerns.

## 2024-02-24 NOTE — Patient Instructions (Addendum)
 SURGICAL WAITING ROOM VISITATION Patients having surgery or a procedure may have no more than 2 support people in the waiting area - these visitors may rotate in the visitor waiting room.   Due to an increase in RSV and influenza rates and associated hospitalizations, children ages 53 and under may not visit patients in [facility] hospitals. If the patient needs to stay at the hospital during part of their recovery, the visitor guidelines for inpatient rooms apply.  PRE-OP VISITATION  Pre-op nurse will coordinate an appropriate time for 1 support person to accompany the patient in pre-op.  This support person may not rotate.  This visitor will be contacted when the time is appropriate for the visitor to come back in the pre-op area.  Please refer to the [facility] website for the visitor guidelines for Inpatients (after your surgery is over and you are in a regular room).  You are not required to quarantine at this time prior to your surgery. However, you must do this: Hand Hygiene often Do NOT share personal items Notify your provider if you are in close contact with someone who has COVID or you develop fever 100.4 or greater, new onset of sneezing, cough, sore throat, shortness of breath or body aches.  If you test positive for Covid or have been in contact with anyone that has tested positive in the last 10 days please notify you surgeon.    Your procedure is scheduled on:  Friday  February 27, 2024  Report to [facility] Main Entrance: [facility] entrance where the [employer] is available.   Report to admitting at:  05:15   AM  Call this number if you have any questions or problems the morning of surgery [phone]  DO NOT EAT OR DRINK ANYTHING AFTER MIDNIGHT THE NIGHT PRIOR TO YOUR SURGERY / PROCEDURE.   FOLLOW  ANY ADDITIONAL PRE OP INSTRUCTIONS YOU RECEIVED FROM YOUR SURGEON'S OFFICE!!!   Oral Hygiene is also important to reduce your risk of infection.        Remember -  BRUSH YOUR TEETH THE MORNING OF SURGERY WITH YOUR REGULAR TOOTHPASTE  Do NOT smoke after Midnight the night before surgery.  STOP TAKING all Vitamins, Herbs and supplements 1 week before your surgery.   Take ONLY these medicines the morning of surgery with A SIP OF WATER: Pantoprazole, Valacyclovir, and Tramadol if needed for pain.  You may use your Eye ointment.    If You have been diagnosed with Sleep Apnea - Bring CPAP mask and tubing day of surgery. We will provide you with a CPAP machine on the day of your surgery.                   You may not have any metal on your body including jewelry, and body piercing  Do not wear lotions, powders,  cologne, or deodorant  Men may shave face and neck.  Contacts, Hearing Aids, dentures or bridgework may not be worn into surgery. DENTURES WILL BE REMOVED PRIOR TO SURGERY PLEASE DO NOT APPLY "Poly grip" OR ADHESIVES!!!  Patients discharged on the day of surgery will not be allowed to drive home.  Someone NEEDS to stay with you for the first 24 hours after anesthesia.  Do not bring your home medications to the hospital. The Pharmacy will dispense medications listed on your medication list to you during your admission in the Hospital.  Please read over the following fact sheets you were given: IF YOU  HAVE QUESTIONS ABOUT YOUR PRE-OP INSTRUCTIONS, PLEASE CALL [phone].   [place] - Preparing for Surgery Before surgery, you can play an important role.  Because skin is not sterile, your skin needs to be as free of germs as possible.  You can reduce the number of germs on your skin by washing with CHG (chlorahexidine gluconate) soap before surgery.  CHG is an antiseptic cleaner which kills germs and bonds with the skin to continue killing germs even after washing. Please DO NOT use if you have an allergy to CHG or antibacterial soaps.  If your skin becomes reddened/irritated stop using the CHG and inform your nurse when you arrive at Short  Stay. Do not shave (including legs and underarms) for at least 48 hours prior to the first CHG shower.  You may shave your face/neck.  Please follow these instructions carefully:  1.  Shower with CHG Soap the night before surgery and the  morning of surgery.  2.  If you choose to wash your hair, wash your hair first as usual with your normal  shampoo.  3.  After you shampoo, rinse your hair and body thoroughly to remove the shampoo.                             4.  Use CHG as you would any other liquid soap.  You can apply chg directly to the skin and wash.  Gently with a scrungie or clean washcloth.  5.  Apply the CHG Soap to your body ONLY FROM THE NECK DOWN.   Do not use on face/ open                           Wound or open sores. Avoid contact with eyes, ears mouth and genitals (private parts).                       Wash face,  Genitals (private parts) with your normal soap.             6.  Wash thoroughly, paying special attention to the area where your  surgery  will be performed.  7.  Thoroughly rinse your body with warm water from the neck down.  8.  DO NOT shower/wash with your normal soap after using and rinsing off the CHG Soap.            9.  Pat yourself dry with a clean towel.            10.  Wear clean pajamas.            11.  Place clean sheets on your bed the night of your first shower and do not  sleep with pets.  ON THE DAY OF SURGERY : Do not apply any lotions/deodorants the morning of surgery.  Please wear clean clothes to the hospital/surgery center.    FAILURE TO FOLLOW THESE INSTRUCTIONS MAY RESULT IN THE CANCELLATION OF YOUR SURGERY  PATIENT SIGNATURE_________________________________  NURSE SIGNATURE__________________________________  ________________________________________________________________________

## 2024-02-26 ENCOUNTER — Encounter (HOSPITAL_COMMUNITY): Payer: Self-pay

## 2024-02-26 ENCOUNTER — Encounter: Payer: Self-pay | Admitting: Physical Therapy

## 2024-02-26 ENCOUNTER — Other Ambulatory Visit: Payer: Self-pay

## 2024-02-26 ENCOUNTER — Ambulatory Visit
Admission: RE | Admit: 2024-02-26 | Discharge: 2024-02-26 | Disposition: A | Discharge status: Home / Self Care | Payer: PPO | Source: Ambulatory Visit | Attending: Internal Medicine | Admitting: Internal Medicine

## 2024-02-26 ENCOUNTER — Encounter (HOSPITAL_COMMUNITY)
Admission: RE | Admit: 2024-02-26 | Discharge: 2024-02-26 | Disposition: A | Discharge status: Home / Self Care | Payer: PPO | Source: Ambulatory Visit | Attending: Urology | Admitting: Urology

## 2024-02-26 DIAGNOSIS — Z01818 Encounter for other preprocedural examination: Secondary | ICD-10-CM

## 2024-02-26 DIAGNOSIS — I10 Essential (primary) hypertension: Secondary | ICD-10-CM

## 2024-02-26 DIAGNOSIS — R053 Chronic cough: Secondary | ICD-10-CM | POA: Diagnosis not present

## 2024-02-26 DIAGNOSIS — J439 Emphysema, unspecified: Secondary | ICD-10-CM | POA: Diagnosis not present

## 2024-02-26 DIAGNOSIS — Z01812 Encounter for preprocedural laboratory examination: Secondary | ICD-10-CM | POA: Insufficient documentation

## 2024-02-26 DIAGNOSIS — K449 Diaphragmatic hernia without obstruction or gangrene: Secondary | ICD-10-CM | POA: Diagnosis not present

## 2024-02-26 DIAGNOSIS — I2510 Atherosclerotic heart disease of native coronary artery without angina pectoris: Secondary | ICD-10-CM | POA: Diagnosis not present

## 2024-02-26 DIAGNOSIS — R9389 Abnormal findings on diagnostic imaging of other specified body structures: Secondary | ICD-10-CM

## 2024-02-26 HISTORY — DX: Chronic obstructive pulmonary disease, unspecified: J44.9

## 2024-02-26 HISTORY — DX: Essential (primary) hypertension: I10

## 2024-02-26 LAB — BASIC METABOLIC PANEL
Anion gap: 8 (ref 5–15)
BUN: 20 mg/dL (ref 8–23)
CO2: 23 mmol/L (ref 22–32)
Calcium: 9.3 mg/dL (ref 8.9–10.3)
Chloride: 104 mmol/L (ref 98–111)
Creatinine, Ser: 1.1 mg/dL (ref 0.61–1.24)
GFR, Estimated: >60 mL/min (ref >60)
Glucose, Bld: 139 mg/dL — ABNORMAL HIGH (ref 70–99)
Potassium: 4.7 mmol/L (ref 3.5–5.1)
Sodium: 135 mmol/L (ref 135–145)

## 2024-02-26 LAB — CBC
HCT: 46.9 % (ref 39.0–52.0)
Hemoglobin: 14.6 g/dL (ref 13.0–17.0)
MCH: 31.2 pg (ref 26.0–34.0)
MCHC: 31.1 g/dL (ref 30.0–36.0)
MCV: 100.2 fL — ABNORMAL HIGH (ref 80.0–100.0)
Platelets: 195 10*3/uL (ref 150–400)
RBC: 4.68 MIL/uL (ref 4.22–5.81)
RDW: 12 % (ref 11.5–15.5)
WBC: 5.1 10*3/uL (ref 4.0–10.5)
nRBC: 0 % (ref 0.0–0.2)

## 2024-02-26 LAB — SURGICAL PCR SCREEN
MRSA, PCR: NEGATIVE
Staphylococcus aureus: NEGATIVE

## 2024-02-26 NOTE — Therapy (Signed)
 [facility] [address], [address] [place], [place], [address] Phone: [phone]   Fax:  [phone]  Patient Details  Name: KEIGHAN AMEZCUA MRN: [id number] Date of Birth: January 05, 1951 Referring Provider:  No ref. provider found  Encounter Date: 02/26/2024  PHYSICAL THERAPY DISCHARGE SUMMARY  Visits from Start of Care: 2  Current functional level related to goals / functional outcomes: BPPV cleared, pt did not return after 2nd visit.   Remaining deficits: NA   Education / Equipment: Rationale for BPPV treatment.   Patient agrees to discharge. Patient goals were met. Patient is being discharged due to being pleased with the current functional level.   Tim Wilhide W., PT 02/26/2024, 8:53 AM  [place]  [facility] [address] [place], [place], [address] Phone: [phone]   Fax:  [phone]

## 2024-02-26 NOTE — Anesthesia Preprocedure Evaluation (Addendum)
 Anesthesia Evaluation  Patient identified by MRN, date of birth, ID band Patient awake    Reviewed: Allergy & Precautions, NPO status , Patient's Chart, lab work & pertinent test results  History of Anesthesia Complications (+) history of anesthetic complications  Airway Mallampati: I  TM Distance: >3 FB Neck ROM: Full    Dental no notable dental hx. (+) Teeth Intact, Dental Advisory Given   Pulmonary former smoker   Pulmonary exam normal breath sounds clear to auscultation       Cardiovascular hypertension, (-) angina (-) CAD and (-) Past MI Normal cardiovascular exam(-) dysrhythmias  Rhythm:Regular Rate:Normal     Neuro/Psych  PSYCHIATRIC DISORDERS Anxiety      Neuromuscular disease    GI/Hepatic Neg liver ROS,GERD  Controlled and Medicated,,  Endo/Other    Renal/GU Renal diseaseLab Results      Component                Value               Date                                  K                        4.7                 02/26/2024                CO2                      23                  02/26/2024                BUN                      20                  02/26/2024                CREATININE               1.10                02/26/2024                  Musculoskeletal  (+) Arthritis ,    Abdominal   Peds  Hematology Lab Results      Component                Value               Date                      WBC                      5.1                 02/26/2024                HGB                      14.6                02/26/2024  HCT                      46.9                02/26/2024                MCV                      100.2 (H)           02/26/2024                PLT                      195                 02/26/2024              Anesthesia Other Findings   Reproductive/Obstetrics                             Anesthesia Physical Anesthesia Plan  ASA:  3  Anesthesia Plan: General   Post-op Pain Management:    Induction: Intravenous  PONV Risk Score and Plan: 3 and Treatment may vary due to age or medical condition, Ondansetron and Midazolam  Airway Management Planned: LMA  Additional Equipment: None  Intra-op Plan:   Post-operative Plan: Extubation in OR  Informed Consent: I have reviewed the patients History and Physical, chart, labs and discussed the procedure including the risks, benefits and alternatives for the proposed anesthesia with the patient or authorized representative who has indicated his/her understanding and acceptance.     Dental advisory given  Plan Discussed with: CRNA and Surgeon  Anesthesia Plan Comments:         Anesthesia Quick Evaluation

## 2024-02-27 ENCOUNTER — Ambulatory Visit (HOSPITAL_COMMUNITY): Payer: Self-pay | Admitting: Anesthesiology

## 2024-02-27 ENCOUNTER — Encounter (HOSPITAL_COMMUNITY): Payer: Self-pay | Admitting: Urology

## 2024-02-27 ENCOUNTER — Ambulatory Visit (HOSPITAL_BASED_OUTPATIENT_CLINIC_OR_DEPARTMENT_OTHER): Payer: Self-pay | Admitting: Anesthesiology

## 2024-02-27 ENCOUNTER — Encounter (HOSPITAL_COMMUNITY)
Admission: RE | Disposition: A | Discharge status: Home / Self Care | Payer: Self-pay | Source: Home / Self Care | Attending: Urology

## 2024-02-27 ENCOUNTER — Other Ambulatory Visit: Payer: Self-pay

## 2024-02-27 ENCOUNTER — Ambulatory Visit (HOSPITAL_COMMUNITY)
Admission: RE | Admit: 2024-02-27 | Discharge: 2024-02-27 | Disposition: A | Discharge status: Home / Self Care | Payer: PPO | Attending: Urology | Admitting: Urology

## 2024-02-27 DIAGNOSIS — K219 Gastro-esophageal reflux disease without esophagitis: Secondary | ICD-10-CM | POA: Insufficient documentation

## 2024-02-27 DIAGNOSIS — R350 Frequency of micturition: Secondary | ICD-10-CM | POA: Diagnosis not present

## 2024-02-27 DIAGNOSIS — R972 Elevated prostate specific antigen [PSA]: Secondary | ICD-10-CM | POA: Insufficient documentation

## 2024-02-27 DIAGNOSIS — Z87891 Personal history of nicotine dependence: Secondary | ICD-10-CM | POA: Diagnosis not present

## 2024-02-27 DIAGNOSIS — R3912 Poor urinary stream: Secondary | ICD-10-CM | POA: Diagnosis not present

## 2024-02-27 DIAGNOSIS — I10 Essential (primary) hypertension: Secondary | ICD-10-CM | POA: Insufficient documentation

## 2024-02-27 DIAGNOSIS — Z79624 Long term (current) use of inhibitors of nucleotide synthesis: Secondary | ICD-10-CM | POA: Diagnosis not present

## 2024-02-27 DIAGNOSIS — Z79899 Other long term (current) drug therapy: Secondary | ICD-10-CM | POA: Diagnosis not present

## 2024-02-27 DIAGNOSIS — E785 Hyperlipidemia, unspecified: Secondary | ICD-10-CM

## 2024-02-27 DIAGNOSIS — M1990 Unspecified osteoarthritis, unspecified site: Secondary | ICD-10-CM | POA: Diagnosis not present

## 2024-02-27 DIAGNOSIS — J439 Emphysema, unspecified: Secondary | ICD-10-CM | POA: Diagnosis not present

## 2024-02-27 DIAGNOSIS — N401 Enlarged prostate with lower urinary tract symptoms: Secondary | ICD-10-CM | POA: Diagnosis not present

## 2024-02-27 DIAGNOSIS — N400 Enlarged prostate without lower urinary tract symptoms: Secondary | ICD-10-CM

## 2024-02-27 DIAGNOSIS — N183 Chronic kidney disease, stage 3 unspecified: Secondary | ICD-10-CM | POA: Diagnosis not present

## 2024-02-27 DIAGNOSIS — F419 Anxiety disorder, unspecified: Secondary | ICD-10-CM | POA: Diagnosis not present

## 2024-02-27 DIAGNOSIS — I129 Hypertensive chronic kidney disease with stage 1 through stage 4 chronic kidney disease, or unspecified chronic kidney disease: Secondary | ICD-10-CM | POA: Diagnosis not present

## 2024-02-27 HISTORY — PX: TRANSURETHRAL RESECTION OF PROSTATE: SHX73

## 2024-02-27 SURGERY — TURP (TRANSURETHRAL RESECTION OF PROSTATE)
Anesthesia: General | Site: Prostate

## 2024-02-27 MED ORDER — ONDANSETRON HCL 4 MG/2ML IJ SOLN
INTRAMUSCULAR | Status: DC | PRN
  Administered 2024-02-27: 4 mg via INTRAVENOUS

## 2024-02-27 MED ORDER — CEPHALEXIN 500 MG PO CAPS: 500.0000 mg | ORAL_CAPSULE | Freq: Every day | ORAL | 0 refills | Status: DC

## 2024-02-27 MED ORDER — LIDOCAINE HCL (PF) 2 % IJ SOLN
INTRAMUSCULAR | Status: AC
  Filled 2024-02-27: qty 5

## 2024-02-27 MED ORDER — SODIUM CHLORIDE 0.9 % IR SOLN
Status: DC | PRN
  Administered 2024-02-27: 3000 mL via INTRAVESICAL
  Administered 2024-02-27: 6000 mL via INTRAVESICAL
  Administered 2024-02-27: 3000 mL via INTRAVESICAL

## 2024-02-27 MED ORDER — ORAL CARE MOUTH RINSE: 15.0000 mL | Freq: Once | OROMUCOSAL | Status: AC

## 2024-02-27 MED ORDER — FENTANYL CITRATE (PF) 100 MCG/2ML IJ SOLN
INTRAMUSCULAR | Status: DC | PRN
Start: 2024-02-27 — End: 2024-02-27
  Administered 2024-02-27 (×4): 50 ug via INTRAVENOUS

## 2024-02-27 MED ORDER — MIDAZOLAM HCL 2 MG/2ML IJ SOLN
INTRAMUSCULAR | Status: AC
  Filled 2024-02-27: qty 2

## 2024-02-27 MED ORDER — ONDANSETRON HCL 4 MG/2ML IJ SOLN: 4.0000 mg | Freq: Once | INTRAMUSCULAR | Status: DC | PRN

## 2024-02-27 MED ORDER — ONDANSETRON HCL 4 MG/2ML IJ SOLN
INTRAMUSCULAR | Status: AC
  Filled 2024-02-27: qty 2

## 2024-02-27 MED ORDER — PROPOFOL 10 MG/ML IV BOLUS
INTRAVENOUS | Status: AC
  Filled 2024-02-27: qty 20

## 2024-02-27 MED ORDER — FENTANYL CITRATE (PF) 250 MCG/5ML IJ SOLN
INTRAMUSCULAR | Status: AC
Start: 2024-02-27 — End: ?
  Filled 2024-02-27: qty 5

## 2024-02-27 MED ORDER — DEXAMETHASONE SODIUM PHOSPHATE 10 MG/ML IJ SOLN
INTRAMUSCULAR | Status: AC
  Filled 2024-02-27: qty 1

## 2024-02-27 MED ORDER — DEXAMETHASONE SODIUM PHOSPHATE 10 MG/ML IJ SOLN
INTRAMUSCULAR | Status: DC | PRN
Start: 2024-02-27 — End: 2024-02-27
  Administered 2024-02-27: 4 mg via INTRAVENOUS

## 2024-02-27 MED ORDER — CHLORHEXIDINE GLUCONATE 0.12 % MT SOLN
15.0000 mL | Freq: Once | OROMUCOSAL | Status: AC
  Administered 2024-02-27: 15 mL via OROMUCOSAL

## 2024-02-27 MED ORDER — PROPOFOL 10 MG/ML IV BOLUS
INTRAVENOUS | Status: DC | PRN
  Administered 2024-02-27: 180 mg via INTRAVENOUS

## 2024-02-27 MED ORDER — SODIUM CHLORIDE 0.9 % IV SOLN
2.0000 g | INTRAVENOUS | Status: AC
  Administered 2024-02-27: 2 g via INTRAVENOUS
  Filled 2024-02-27: qty 20

## 2024-02-27 MED ORDER — FENTANYL CITRATE PF 50 MCG/ML IJ SOSY
PREFILLED_SYRINGE | INTRAMUSCULAR | Status: AC
  Filled 2024-02-27: qty 1

## 2024-02-27 MED ORDER — ACETAMINOPHEN 10 MG/ML IV SOLN: 1000.0000 mg | Freq: Once | INTRAVENOUS | Status: DC | PRN

## 2024-02-27 MED ORDER — LACTATED RINGERS IV SOLN: INTRAVENOUS | Status: DC

## 2024-02-27 MED ORDER — FENTANYL CITRATE PF 50 MCG/ML IJ SOSY
25.0000 ug | PREFILLED_SYRINGE | INTRAMUSCULAR | Status: DC | PRN
  Administered 2024-02-27: 50 ug via INTRAVENOUS

## 2024-02-27 MED ORDER — LIDOCAINE HCL URETHRAL/MUCOSAL 2 % EX GEL
CUTANEOUS | Status: AC
  Filled 2024-02-27: qty 30

## 2024-02-27 MED ORDER — MIDAZOLAM HCL 5 MG/5ML IJ SOLN
INTRAMUSCULAR | Status: DC | PRN
  Administered 2024-02-27 (×2): 1 mg via INTRAVENOUS

## 2024-02-27 MED ORDER — LIDOCAINE HCL (CARDIAC) PF 100 MG/5ML IV SOSY
PREFILLED_SYRINGE | INTRAVENOUS | Status: DC | PRN
  Administered 2024-02-27: 60 mg via INTRAVENOUS

## 2024-02-27 SURGICAL SUPPLY — 18 items
BAG URINE DRAIN 2000ML AR STRL (UROLOGICAL SUPPLIES) ×1 IMPLANT
BAG URO CATCHER STRL LF (MISCELLANEOUS) ×1 IMPLANT
CATH FOLEY 2WAY SLVR 5CC 20FR (CATHETERS) IMPLANT
DRAPE FOOT SWITCH (DRAPES) ×1 IMPLANT
EVACUATOR MICROVAS BLADDER (UROLOGICAL SUPPLIES) ×1 IMPLANT
GLOVE SURG LX STRL 7.5 STRW (GLOVE) ×1 IMPLANT
GOWN STRL REUS W/ TWL XL LVL3 (GOWN DISPOSABLE) ×1 IMPLANT
HOLDER FOLEY CATH W/STRAP (MISCELLANEOUS) IMPLANT
KIT TURNOVER KIT A (KITS) IMPLANT
LOOP CUT BIPOLAR 24F LRG (ELECTROSURGICAL) IMPLANT
MANIFOLD NEPTUNE II (INSTRUMENTS) ×1 IMPLANT
PACK CYSTO (CUSTOM PROCEDURE TRAY) ×1 IMPLANT
PAD PREP 24X48 CUFFED NSTRL (MISCELLANEOUS) ×1 IMPLANT
SYR 30ML LL (SYRINGE) IMPLANT
SYR TOOMEY IRRIG 70ML (MISCELLANEOUS) ×1 IMPLANT
SYRINGE TOOMEY IRRIG 70ML (MISCELLANEOUS) ×1 IMPLANT
TUBING CONNECTING 10 (TUBING) ×1 IMPLANT
TUBING UROLOGY SET (TUBING) ×1 IMPLANT

## 2024-02-27 NOTE — Anesthesia Postprocedure Evaluation (Signed)
 Anesthesia Post Note  Patient: Jonathan Young  Procedure(s) Performed: TURP (TRANSURETHRAL RESECTION OF PROSTATE) (Prostate)     Patient location during evaluation: PACU Anesthesia Type: General Level of consciousness: awake and alert Pain management: pain level controlled Vital Signs Assessment: post-procedure vital signs reviewed and stable Respiratory status: spontaneous breathing, nonlabored ventilation, respiratory function stable and patient connected to nasal cannula oxygen Cardiovascular status: blood pressure returned to baseline and stable Postop Assessment: no apparent nausea or vomiting Anesthetic complications: no  No notable events documented.  Last Vitals:  Vitals:   02/27/24 0915 02/27/24 0930  BP: (!) 144/90 (!) 136/91  Pulse: 80 69  Resp: 20 12  Temp:  36.8 C  SpO2: 94% 91%    Last Pain:  Vitals:   02/27/24 0930  TempSrc:   PainSc: 2                  Trevor Iha

## 2024-02-27 NOTE — H&P (Addendum)
 Chief complaint: BPH, frequency  History of Present Illness: Jonathan Young is a 73 year old male with a history of BPH.  Prostate was about 50 g on imaging.  He underwent a water vapor thermal therapy in the office in 2021 and initially did well but had recurrent weak stream and frequency.  Office cystoscopy revealed some residual tissue possible some fusion of the lateral lobes.  He presents today for TURP.  He has been well without cough cold or congestion.  No dysuria or gross hematuria.  Past Medical History:  Diagnosis Date   Complication of anesthesia    sore throat from tube   COPD (chronic obstructive pulmonary disease) (HCC)    Emphysema   Dysrhythmia    PVC'S   GERD (gastroesophageal reflux disease)    Hypercholesteremia    Hypertension    Neuromuscular disorder (HCC) 2005   had right foot-drop, no etiology found, resolved after steroids   Osteoarthritis    Umbilical hernia    Vertigo    Past Surgical History:  Procedure Laterality Date   ANTERIOR CERVICAL DECOMP/DISCECTOMY FUSION  2018   C5-6 and C6-7   done by Dr. Donalda Ewings LIFT AND BLEPHAROPLASTY Bilateral 2025   CARPAL TUNNEL RELEASE Left 05/23/2018   CARPAL TUNNEL RELEASE Right 11/22/2018   CERVICAL SPINE SURGERY  2019   ACDF   HEMORRHOID SURGERY  1982   HERNIA REPAIR  2021   umbilical hernia wuth mesh   INSERTION OF MESH N/A 07/27/2020   Procedure: INSERTION OF MESH;  Surgeon: Harriette Bouillon, MD;  Location: [facility];  Service: General;  Laterality: N/A;   SHOULDER SURGERY     BIL   TONSILLECTOMY AND ADENOIDECTOMY     TOTAL HIP ARTHROPLASTY     RT TOAL HIP   TOTAL HIP ARTHROPLASTY  06/01/2012   Procedure: TOTAL HIP ARTHROPLASTY ANTERIOR APPROACH;  Surgeon: Shelda Pal, MD;  Location: [facility];  Service: Orthopedics;  Laterality: Left;   UMBILICAL HERNIA REPAIR N/A 07/27/2020   Procedure: UMBILICAL HERNIA REPAIR WITH MESH;  Surgeon: Harriette Bouillon, MD;  Location: [facility];   Service: General;  Laterality: N/A;   WISDOM TOOTH EXTRACTION      Home Medications:  Medications Prior to Admission  Medication Sig Dispense Refill Last Dose/Taking   calcium carbonate (OS-CAL - DOSED IN MG OF ELEMENTAL CALCIUM) 1250 (500 Ca) MG tablet Take 1 tablet by mouth daily with breakfast.   02/26/2024 Morning   Cholecalciferol 50 MCG (2000 UT) TABS Take 1 tablet (2,000 Units total) by mouth daily. 90 tablet 1 02/26/2024 Morning   erythromycin ophthalmic ointment Place 1 Application into both eyes every 6 (six) hours.   02/27/2024 Morning   MAGNESIUM GLYCINATE PO Take 240 mg by mouth at bedtime.   02/25/2024   Multiple Vitamin (MULTIVITAMIN WITH MINERALS) TABS tablet Take 1 tablet by mouth daily. Centrum Silver   02/26/2024 Morning   pantoprazole (PROTONIX) 40 MG tablet Take 1 tablet (40 mg total) by mouth daily. 90 tablet 1 02/27/2024 at  5:00 AM   rosuvastatin (CRESTOR) 10 MG tablet Take 1 tablet (10 mg total) by mouth daily. 90 tablet 1 02/26/2024 Morning   traMADol (ULTRAM) 50 MG tablet Take 50 mg by mouth every 6 (six) hours as needed.   Past Month   valACYclovir (VALTREX) 1000 MG tablet Take 0.5 tablets (500 mg total) by mouth daily. 30 tablet 0 02/26/2024 Morning   amoxicillin (AMOXIL) 500 MG capsule Take 2,000 mg by mouth  See admin instructions. Take 4 capsules (2000 mg) by mouth 1 hour prior to dental appointments.   More than a month   Suvorexant (BELSOMRA) 15 MG TABS Take 1 tablet by mouth at bedtime as needed. 90 tablet 0 More than a month   tadalafil (CIALIS) 20 MG tablet Take 1 tablet (20 mg total) by mouth See admin instructions. Take 20 mg by mouth one hour prior to intercourse- maximum of 20 mg (1 tablet) in 24 hours 20 tablet 2 More than a month   Allergies:  Allergies  Allergen Reactions   Vytorin [Ezetimibe-Simvastatin] Other (See Comments)    Muscle aches    Family History  Problem Relation Age of Onset   Emphysema Mother    Colon cancer Neg Hx    Esophageal cancer Neg Hx     Rectal cancer Neg Hx    Stomach cancer Neg Hx    Social History:  reports that he quit smoking about 42 years ago. His smoking use included cigarettes. He has never been exposed to tobacco smoke. He has never used smokeless tobacco. He reports current alcohol use of about 20.0 standard drinks of alcohol per week. He reports that he does not use drugs.  ROS: A complete review of systems was performed.  All systems are negative except for pertinent findings as noted. Review of Systems  All other systems reviewed and are negative.    Physical Exam:  Vital signs in last 24 hours: Temp:  [98.2 F (36.8 C)-98.3 F (36.8 C)] 98.2 F (36.8 C) (03/07 0541) Pulse Rate:  [80-92] 92 (03/07 0541) Resp:  [17-97] 17 (03/07 0541) BP: (124-128)/(77-83) 128/83 (03/07 0541) SpO2:  [97 %-98 %] 98 % (03/07 0541) Weight:  [83.9 kg] 83.9 kg (03/07 0630) General:  Alert and oriented, No acute distress HEENT: Normocephalic, atraumatic Cardiovascular: Regular rate and rhythm Lungs: Regular rate and effort Abdomen: Soft, nontender, nondistended, no abdominal masses Back: No CVA tenderness Extremities: No edema Neurologic: Grossly intact  Laboratory Data:  Results for orders placed or performed during the hospital encounter of 02/26/24 (from the past 24 hours)  Surgical pcr screen     Status: None   Collection Time: 02/26/24  9:00 AM   Specimen: Nasal Mucosa; Nasal Swab  Result Value Ref Range   MRSA, PCR NEGATIVE NEGATIVE   Staphylococcus aureus NEGATIVE NEGATIVE  CBC per protocol     Status: Abnormal   Collection Time: 02/26/24  9:00 AM  Result Value Ref Range   WBC 5.1 4.0 - 10.5 K/uL   RBC 4.68 4.22 - 5.81 MIL/uL   Hemoglobin 14.6 13.0 - 17.0 g/dL   HCT 16.1 09.6 - 04.5 %   MCV 100.2 (H) 80.0 - 100.0 fL   MCH 31.2 26.0 - 34.0 pg   MCHC 31.1 30.0 - 36.0 g/dL   RDW 40.9 81.1 - 91.4 %   Platelets 195 150 - 400 K/uL   nRBC 0.0 0.0 - 0.2 %  Basic metabolic panel per protocol     Status:  Abnormal   Collection Time: 02/26/24  9:00 AM  Result Value Ref Range   Sodium 135 135 - 145 mmol/L   Potassium 4.7 3.5 - 5.1 mmol/L   Chloride 104 98 - 111 mmol/L   CO2 23 22 - 32 mmol/L   Glucose, Bld 139 (H) 70 - 99 mg/dL   BUN 20 8 - 23 mg/dL   Creatinine, Ser 7.82 0.61 - 1.24 mg/dL   Calcium 9.3 8.9 - 95.6 mg/dL  GFR, Estimated >60 >60 mL/min   Anion gap 8 5 - 15   Recent Results (from the past 240 hours)  Surgical pcr screen     Status: None   Collection Time: 02/26/24  9:00 AM   Specimen: Nasal Mucosa; Nasal Swab  Result Value Ref Range Status   MRSA, PCR NEGATIVE NEGATIVE Final   Staphylococcus aureus NEGATIVE NEGATIVE Final    Comment: (NOTE) The Xpert SA Assay (FDA approved for NASAL specimens in patients 50 years of age and older), is one component of a comprehensive surveillance program. It is not intended to diagnose infection nor to guide or monitor treatment. Performed at [facility], [address], [place], [place] [address]    Creatinine: Recent Labs    02/26/24 0900  CREATININE 1.10    Impression/Assessment:  BPH, lower urinary tract symptoms-  Plan:  I discussed with the patient and his friend the nature, potential benefits, risks and alternatives to TURP, including side effects of the proposed treatment, the likelihood of the patient achieving the goals of the procedure, and any potential problems that might occur during the procedure or recuperation.  Discussed expectations for flow versus irritative symptoms.  Again, frequency and nocturia can persist but typically stream improves.  Discussed he may have him take his catheter out earlier at home and we went over the procedure.  He would be interested in removing the catheter earlier if possible all questions answered. Patient elects to proceed.   Jerilee Field 02/27/2024, 7:33 AM

## 2024-02-27 NOTE — Op Note (Signed)
 Preoperative diagnosis: BPH, PSA elevation Postoperative diagnosis: Same  Procedure: TURP  Surgeon: Mena Goes  Anesthesia: General  Indication for procedure:Jonathan Young is a 73 year old male with a history of BPH.  He underwent water vapor thermal therapy in the office but continued to have lower urinary tract symptoms.  Cystoscopy revealed continued obstruction and fusion of the lobes.  Findings: On exam the penis was circumcised without mass or lesion.  The glans and meatus appeared normal.  Testicles descended bilaterally and palpably normal.  Scrotum normal.  On DRE prostate was about 50 g and smooth without hard area or nodule.  On cystoscopy there was mainly right lateral lobe hypertrophy causing obstruction in the right lobe and fused over to the left side with 2 bridges of tissue.  The left lobe almost appeared resected from the prior Rezum but minimal treatment effect of the right lobe.  The bladder neck was patent and appeared normal.  He had a high bladder neck.  Bladder was moderately trabeculated without lesion.  No stone or foreign body in the bladder.  The ureteral orifices were in their normal orthotopic position with clear E flux.  After resection there was a good channel.  The right lateral lobe was resected but I left some tissue apically over the verumontanum.  Left lobe was minimally resected.  Mainly anteriorly.  Prostate bladder neck resection at 5 and 7:00, minimal laterally and none anteriorly.  Description of procedure: After consent was obtained patient was brought to the operating room.  After adequate anesthesia he was placed in lithotomy position and prepped and draped in the usual sterile fashion.  Timeout was performed to confirm the patient and procedure.  Cystoscope was passed per urethra and I had to find my way through 2 of the adhesive bands around the right lateral lobe to get up into the prostatic urethra and into the bladder.  Bladder was inspected.  I then swapped  that out for the continuous-flow sheath with the visual obturator.  The loop and the handle was then passed.  I started on the left side at the patient 7:00 and incised through the prostate to find the bladder neck and quickly brought that down into the left prostatic urethra where it was patent.  I then went to the right side slightly more laterally about 8:00 on the patient's right incised the prostatic adenoma down to the bladder neck and then brought that down toward the verumontanum releasing the adhesions.  I then went anterior to posterior on the right lateral lobe and resected bladder neck down to the verumontanum leaving some tissue over the very apically and posteriorly.  A minimal amount of anterior left bladder neck to mid prostate tissue was resected.  This created an excellent channel.  All the chips were evacuated.  Left side required minimal resection because it had good treatment effect and shrinkage after the water vapor thermal therapy.  Hemostasis was excellent at low pressure.  Scope was backed out.  I placed a 20 [place] two-way catheter with 20 cc in the balloon and seated that at the bladder neck.  Irrigation was clear.  He was cleaned up and placed on a light rubber band traction for wake-up and transfer.  Stable to PACU.  Complications: None  Blood loss: 25 mL  Specimens: TURP chips  Drains: 20 French Foley catheter  Disposition: Patient stable to PACU.

## 2024-02-27 NOTE — Anesthesia Procedure Notes (Signed)
 Procedure Name: LMA Insertion Date/Time: 02/27/2024 7:49 AM  Performed by: Garth Bigness, CRNAPre-anesthesia Checklist: Patient identified, Emergency Drugs available, Suction available and Patient being monitored Patient Re-evaluated:Patient Re-evaluated prior to induction Oxygen Delivery Method: Circle system utilized Preoxygenation: Pre-oxygenation with 100% oxygen Induction Type: IV induction Ventilation: Mask ventilation without difficulty LMA: LMA inserted LMA Size: 5.0 Tube type: Oral Number of attempts: 1 Placement Confirmation: positive ETCO2 and breath sounds checked- equal and bilateral Tube secured with: Tape Dental Injury: Teeth and Oropharynx as per pre-operative assessment

## 2024-02-27 NOTE — Transfer of Care (Signed)
 Immediate Anesthesia Transfer of Care Note  Patient: Jonathan Young  Procedure(s) Performed: TURP (TRANSURETHRAL RESECTION OF PROSTATE) (Prostate)  Patient Location: PACU  Anesthesia Type:General  Level of Consciousness: awake, alert , oriented, and patient cooperative  Airway & Oxygen Therapy: Patient Spontanous Breathing and Patient connected to face mask oxygen  Post-op Assessment: Report given to RN and Post -op Vital signs reviewed and stable  Post vital signs: Reviewed and stable  Last Vitals:  Vitals Value Taken Time  BP 134/86 02/27/24 0839  Temp    Pulse 69 02/27/24 0842  Resp 10 02/27/24 0842  SpO2 100 % 02/27/24 0842  Vitals shown include unfiled device data.  Last Pain:  Vitals:   02/27/24 0630  TempSrc:   PainSc: 0-No pain      Patients Stated Pain Goal: 5 (02/27/24 0630)  Complications: No notable events documented.

## 2024-02-27 NOTE — Discharge Instructions (Addendum)
 Removal of the Foley: It is okay to remove your Foley on Monday morning, March 01, 2024 as instructed.  20 mL is in the balloon send make sure all the balloon fluid drained send stops draining.  The catheter should gently slide out.

## 2024-03-01 ENCOUNTER — Encounter (HOSPITAL_COMMUNITY): Payer: Self-pay | Admitting: Urology

## 2024-03-01 LAB — SURGICAL PATHOLOGY

## 2024-03-05 DIAGNOSIS — R3912 Poor urinary stream: Secondary | ICD-10-CM | POA: Diagnosis not present

## 2024-03-14 ENCOUNTER — Encounter: Payer: Self-pay | Admitting: Internal Medicine

## 2024-03-15 ENCOUNTER — Other Ambulatory Visit: Payer: Self-pay | Admitting: Internal Medicine

## 2024-03-15 ENCOUNTER — Encounter: Payer: Self-pay | Admitting: Internal Medicine

## 2024-03-15 DIAGNOSIS — J432 Centrilobular emphysema: Secondary | ICD-10-CM | POA: Insufficient documentation

## 2024-03-16 ENCOUNTER — Ambulatory Visit: Admitting: Family Medicine

## 2024-03-16 ENCOUNTER — Encounter: Payer: Self-pay | Admitting: Family Medicine

## 2024-03-16 ENCOUNTER — Other Ambulatory Visit: Payer: Self-pay

## 2024-03-16 VITALS — BP 110/82 | HR 71 | Ht 73.0 in | Wt 189.0 lb

## 2024-03-16 DIAGNOSIS — M7711 Lateral epicondylitis, right elbow: Secondary | ICD-10-CM | POA: Diagnosis not present

## 2024-03-16 DIAGNOSIS — M25521 Pain in right elbow: Secondary | ICD-10-CM

## 2024-03-16 NOTE — Assessment & Plan Note (Signed)
 Epicondylitis noted.  Discussed icing regimen and home exercises, discussed which activities to do and which ones to avoid.  Discussed overhead and lifting.  Increase activity slowly otherwise.  Discussed icing regimen.  Wrist brace given to help avoid repetitive extension of the wrist initially.  Follow-up with me again in 6 to 8 weeks

## 2024-03-16 NOTE — Patient Instructions (Addendum)
 Injected elbow today Exercises  Wrist brace day and night for 1 week then nightly for 2 weeks No over hand lifting See me in 2 months

## 2024-03-16 NOTE — Progress Notes (Signed)
 [facility] [address] [place] [address] Phone: [phone] Subjective:   Jonathan Young, am serving as a scribe for Dr. Antoine Primas.  I'm seeing this patient by the request  of:  Etta Grandchild, MD  CC: Right elbow pain  [id number]  Drexel Ivey Imbert is a 73 y.o. male coming in with complaint of R elbow pain. Last seen in November 2024 for biceps strain, right. Patient states that he has pain over lateral epicondyle. Painful to hold water mug. Had same issue 20 years ago.        Past Medical History:  Diagnosis Date   Complication of anesthesia    sore throat from tube   COPD (chronic obstructive pulmonary disease) (HCC)    Emphysema   Dysrhythmia    PVC'S   GERD (gastroesophageal reflux disease)    Hypercholesteremia    Hypertension    Neuromuscular disorder (HCC) 2005   had right foot-drop, no etiology found, resolved after steroids   Osteoarthritis    Umbilical hernia    Vertigo    Past Surgical History:  Procedure Laterality Date   ANTERIOR CERVICAL DECOMP/DISCECTOMY FUSION  2018   C5-6 and C6-7   done by Dr. Donalda Ewings LIFT AND BLEPHAROPLASTY Bilateral 2025   CARPAL TUNNEL RELEASE Left 05/23/2018   CARPAL TUNNEL RELEASE Right 11/22/2018   CERVICAL SPINE SURGERY  2019   ACDF   HEMORRHOID SURGERY  1982   HERNIA REPAIR  2021   umbilical hernia wuth mesh   INSERTION OF MESH N/A 07/27/2020   Procedure: INSERTION OF MESH;  Surgeon: Harriette Bouillon, MD;  Location: [facility];  Service: General;  Laterality: N/A;   SHOULDER SURGERY     BIL   TONSILLECTOMY AND ADENOIDECTOMY     TOTAL HIP ARTHROPLASTY     RT TOAL HIP   TOTAL HIP ARTHROPLASTY  06/01/2012   Procedure: TOTAL HIP ARTHROPLASTY ANTERIOR APPROACH;  Surgeon: Shelda Pal, MD;  Location: [facility];  Service: Orthopedics;  Laterality: Left;   TRANSURETHRAL RESECTION OF PROSTATE N/A 02/27/2024   Procedure: TURP (TRANSURETHRAL RESECTION OF  PROSTATE);  Surgeon: Jerilee Field, MD;  Location: [facility];  Service: Urology;  Laterality: N/A;  60 MINUTE CASE   UMBILICAL HERNIA REPAIR N/A 07/27/2020   Procedure: UMBILICAL HERNIA REPAIR WITH MESH;  Surgeon: Harriette Bouillon, MD;  Location: [facility];  Service: General;  Laterality: N/A;   WISDOM TOOTH EXTRACTION     Social History   Socioeconomic History   Marital status: Single    Spouse name: Not on file   Number of children: Not on file   Years of education: Not on file   Highest education level: Bachelor's degree (e.g., BA, AB, BS)  Occupational History   Not on file  Tobacco Use   Smoking status: Former    Current packs/day: 0.00    Types: Cigarettes    Quit date: 05/27/1981    Years since quitting: 42.8    Passive exposure: Never   Smokeless tobacco: Never  Vaping Use   Vaping status: Never Used  Substance and Sexual Activity   Alcohol use: Yes    Alcohol/week: 20.0 standard drinks of alcohol    Types: 20 Shots of liquor per week    Comment: wine nightly   Drug use: No   Sexual activity: Yes    Partners: Male  Other Topics Concern   Not on file  Social History Narrative  Not on file   Social Drivers of Health   Financial Resource Strain: Low Risk  (12/21/2023)   Overall Financial Resource Strain (CARDIA)    Difficulty of Paying Living Expenses: Not hard at all  Food Insecurity: No Food Insecurity (12/21/2023)   Hunger Vital Sign    Worried About Running Out of Food in the Last Year: Never true    Ran Out of Food in the Last Year: Never true  Transportation Needs: No Transportation Needs (12/21/2023)   PRAPARE - Administrator, Civil Service (Medical): No    Lack of Transportation (Non-Medical): No  Physical Activity: Insufficiently Active (12/21/2023)   Exercise Vital Sign    Days of Exercise per Week: 3 days    Minutes of Exercise per Session: 40 min  Stress: Stress Concern Present (12/21/2023)   [employer] of  Occupational Health - Occupational Stress Questionnaire    Feeling of Stress : Rather much  Social Connections: Moderately Isolated (12/21/2023)   Social Connection and Isolation Panel [NHANES]    Frequency of Communication with Friends and Family: More than three times a week    Frequency of Social Gatherings with Friends and Family: More than three times a week    Attends Religious Services: Never    Database administrator or Organizations: No    Attends Engineer, structural: Never    Marital Status: Living with partner   Allergies  Allergen Reactions   Vytorin [Ezetimibe-Simvastatin] Other (See Comments)    Muscle aches   Family History  Problem Relation Age of Onset   Emphysema Mother    Colon cancer Neg Hx    Esophageal cancer Neg Hx    Rectal cancer Neg Hx    Stomach cancer Neg Hx      Current Outpatient Medications (Cardiovascular):    rosuvastatin (CRESTOR) 10 MG tablet, Take 1 tablet (10 mg total) by mouth daily.   tadalafil (CIALIS) 20 MG tablet, Take 1 tablet (20 mg total) by mouth See admin instructions. Take 20 mg by mouth one hour prior to intercourse- maximum of 20 mg (1 tablet) in 24 hours   Current Outpatient Medications (Analgesics):    traMADol (ULTRAM) 50 MG tablet, Take 50 mg by mouth every 6 (six) hours as needed.   Current Outpatient Medications (Other):    amoxicillin (AMOXIL) 500 MG capsule, Take 2,000 mg by mouth See admin instructions. Take 4 capsules (2000 mg) by mouth 1 hour prior to dental appointments.   calcium carbonate (OS-CAL - DOSED IN MG OF ELEMENTAL CALCIUM) 1250 (500 Ca) MG tablet, Take 1 tablet by mouth daily with breakfast.   cephALEXin (KEFLEX) 500 MG capsule, Take 1 capsule (500 mg total) by mouth at bedtime.   Cholecalciferol 50 MCG (2000 UT) TABS, Take 1 tablet (2,000 Units total) by mouth daily.   erythromycin ophthalmic ointment, Place 1 Application into both eyes every 6 (six) hours.   MAGNESIUM GLYCINATE PO, Take 240  mg by mouth at bedtime.   Multiple Vitamin (MULTIVITAMIN WITH MINERALS) TABS tablet, Take 1 tablet by mouth daily. Centrum Silver   pantoprazole (PROTONIX) 40 MG tablet, Take 1 tablet (40 mg total) by mouth daily.   Suvorexant (BELSOMRA) 15 MG TABS, Take 1 tablet by mouth at bedtime as needed.   valACYclovir (VALTREX) 1000 MG tablet, Take 0.5 tablets (500 mg total) by mouth daily.   Reviewed prior external information including notes and imaging from  primary care provider As well as notes that were  available from care everywhere and other healthcare systems.  Past medical history, social, surgical and family history all reviewed in electronic medical record.  No pertanent information unless stated regarding to the chief complaint.   Review of Systems:  No headache, visual changes, nausea, vomiting, diarrhea, constipation, dizziness, abdominal pain, skin rash, fevers, chills, night sweats, weight loss, swollen lymph nodes, body aches, joint swelling, chest pain, shortness of breath, mood changes. POSITIVE muscle aches  Objective  Blood pressure 110/82, pulse 71, height 6\' 1"  (1.854 m), weight 189 lb (85.7 kg), SpO2 96%.   General: No apparent distress alert and oriented x3 mood and affect normal, dressed appropriately.  HEENT: Pupils equal, extraocular movements intact  Respiratory: Patient's speak in full sentences and does not appear short of breath  Cardiovascular: No lower extremity edema, non tender, no erythema  Elbow exam shows tenderness to palpation noted over the lateral epicondylar area.  Patient has had worsening pain with resisted extension of the wrist against resistance.  Good grip strength noted.  Negative Tinel's at the wrist.  Limited muscular skeletal ultrasound was performed and interpreted by Antoine Primas, M  Limited ultrasound shows some hypoechoic changes and some dilatation of the common extensor tendon but no true tearing appreciated.  Neovascularization noted.   No cortical irregularity noted. Impression: Severe lateral epicondylitis  Procedure: Real-time Ultrasound Guided Injection of common extensor tendon on the right Device: GE Logiq Q7 Ultrasound guided injection is preferred based studies that show increased duration, increased effect, greater accuracy, decreased procedural pain, increased response rate, and decreased cost with ultrasound guided versus blind injection.  Verbal informed consent obtained.  Time-out conducted.  Noted no overlying erythema, induration, or other signs of local infection.  Skin prepped in a sterile fashion.  Local anesthesia: Topical Ethyl chloride.  With sterile technique and under real time ultrasound guidance: With a 25-gauge half inch needle injected with 0.5 cc of 0.5% Marcaine and 0.5 cc of Kenalog 40 mg/mL into the tendon sheath at the lateral epicondylar region. Completed without difficulty  Pain immediately resolved suggesting accurate placement of the medication.  Advised to call if fevers/chills, erythema, induration, drainage, or persistent bleeding.  Images saved Impression: Technically successful ultrasound guided injection.   Impression and Recommendations:     The above documentation has been reviewed and is accurate and complete Judi Saa, DO

## 2024-04-05 ENCOUNTER — Other Ambulatory Visit: Payer: Self-pay | Admitting: Internal Medicine

## 2024-04-05 DIAGNOSIS — E785 Hyperlipidemia, unspecified: Secondary | ICD-10-CM

## 2024-04-16 DIAGNOSIS — N401 Enlarged prostate with lower urinary tract symptoms: Secondary | ICD-10-CM | POA: Diagnosis not present

## 2024-04-16 DIAGNOSIS — R3912 Poor urinary stream: Secondary | ICD-10-CM | POA: Diagnosis not present

## 2024-05-13 NOTE — Progress Notes (Unsigned)
 [facility] [address] [place] [address] Phone: [phone] Subjective:   Jonathan Young, am serving as a scribe for Dr. Ronnell Coins.  I'm seeing this patient by the request  of:  Arcadio Knuckles, MD  CC: Right elbow pain follow-up  [id number]  03/16/2024 Epicondylitis noted.  Discussed icing regimen and home exercises, discussed which activities to do and which ones to avoid.  Discussed overhead and lifting.  Increase activity slowly otherwise.  Discussed icing regimen.  Wrist brace given to help avoid repetitive extension of the wrist initially.  Follow-up with me again in 6 to 8 weeks    Update 05/18/2024 Jonathan Young is a 73 y.o. male coming in with complaint of R elbow pain.  Was found to have an epicondylitis.  Patient states that he has been favoring his elbow in the gym but has been doing better.   Also c/o pain in R side of his neck and top of shoulder. Sleeping causing great amount of pain.       Past Medical History:  Diagnosis Date   Complication of anesthesia    sore throat from tube   COPD (chronic obstructive pulmonary disease) (HCC)    Emphysema   Dysrhythmia    PVC'S   GERD (gastroesophageal reflux disease)    Hypercholesteremia    Hypertension    Neuromuscular disorder (HCC) 2005   had right foot-drop, no etiology found, resolved after steroids   Osteoarthritis    Umbilical hernia    Vertigo    Past Surgical History:  Procedure Laterality Date   ANTERIOR CERVICAL DECOMP/DISCECTOMY FUSION  2018   C5-6 and C6-7   done by Dr. Rita Cherry LIFT AND BLEPHAROPLASTY Bilateral 2025   CARPAL TUNNEL RELEASE Left 05/23/2018   CARPAL TUNNEL RELEASE Right 11/22/2018   CERVICAL SPINE SURGERY  2019   ACDF   HEMORRHOID SURGERY  1982   HERNIA REPAIR  2021   umbilical hernia wuth mesh   INSERTION OF MESH N/A 07/27/2020   Procedure: INSERTION OF MESH;  Surgeon: Sim Dryer, MD;  Location: [facility];  Service: General;  Laterality: N/A;   SHOULDER SURGERY     BIL   TONSILLECTOMY AND ADENOIDECTOMY     TOTAL HIP ARTHROPLASTY     RT TOAL HIP   TOTAL HIP ARTHROPLASTY  06/01/2012   Procedure: TOTAL HIP ARTHROPLASTY ANTERIOR APPROACH;  Surgeon: Bevin Bucks, MD;  Location: [facility];  Service: Orthopedics;  Laterality: Left;   TRANSURETHRAL RESECTION OF PROSTATE N/A 02/27/2024   Procedure: TURP (TRANSURETHRAL RESECTION OF PROSTATE);  Surgeon: Christina Coyer, MD;  Location: [facility];  Service: Urology;  Laterality: N/A;  60 MINUTE CASE   UMBILICAL HERNIA REPAIR N/A 07/27/2020   Procedure: UMBILICAL HERNIA REPAIR WITH MESH;  Surgeon: Sim Dryer, MD;  Location: [facility];  Service: General;  Laterality: N/A;   WISDOM TOOTH EXTRACTION     Social History   Socioeconomic History   Marital status: Single    Spouse name: Not on file   Number of children: Not on file   Years of education: Not on file   Highest education level: Bachelor's degree (e.g., BA, AB, BS)  Occupational History   Not on file  Tobacco Use   Smoking status: Former    Current packs/day: 0.00    Types: Cigarettes    Quit date: 05/27/1981    Years since quitting: 43.0    Passive  exposure: Never   Smokeless tobacco: Never  Vaping Use   Vaping status: Never Used  Substance and Sexual Activity   Alcohol use: Yes    Alcohol/week: 20.0 standard drinks of alcohol    Types: 20 Shots of liquor per week    Comment: wine nightly   Drug use: No   Sexual activity: Yes    Partners: Male  Other Topics Concern   Not on file  Social History Narrative   Not on file   Social Drivers of Health   Financial Resource Strain: Low Risk  (12/21/2023)   Overall Financial Resource Strain (CARDIA)    Difficulty of Paying Living Expenses: Not hard at all  Food Insecurity: No Food Insecurity (12/21/2023)   Hunger Vital Sign    Worried About Running Out of Food in the Last Year: Never true    Ran Out of Food  in the Last Year: Never true  Transportation Needs: No Transportation Needs (12/21/2023)   PRAPARE - Administrator, Civil Service (Medical): No    Lack of Transportation (Non-Medical): No  Physical Activity: Insufficiently Active (12/21/2023)   Exercise Vital Sign    Days of Exercise per Week: 3 days    Minutes of Exercise per Session: 40 min  Stress: Stress Concern Present (12/21/2023)   Jonathan Young of Occupational Health - Occupational Stress Questionnaire    Feeling of Stress : Rather much  Social Connections: Moderately Isolated (12/21/2023)   Social Connection and Isolation Panel [NHANES]    Frequency of Communication with Friends and Family: More than three times a week    Frequency of Social Gatherings with Friends and Family: More than three times a week    Attends Religious Services: Never    Database administrator or Organizations: No    Attends Engineer, structural: Never    Marital Status: Living with partner   Allergies  Allergen Reactions   Vytorin [Ezetimibe-Simvastatin] Other (See Comments)    Muscle aches   Family History  Problem Relation Age of Onset   Emphysema Mother    Colon cancer Neg Hx    Esophageal cancer Neg Hx    Rectal cancer Neg Hx    Stomach cancer Neg Hx      Current Outpatient Medications (Cardiovascular):    rosuvastatin  (CRESTOR ) 10 MG tablet, TAKE 1 TABLET(10 MG) BY MOUTH DAILY   tadalafil  (CIALIS ) 20 MG tablet, Take 1 tablet (20 mg total) by mouth See admin instructions. Take 20 mg by mouth one hour prior to intercourse- maximum of 20 mg (1 tablet) in 24 hours   Current Outpatient Medications (Analgesics):    traMADol (ULTRAM) 50 MG tablet, Take 50 mg by mouth every 6 (six) hours as needed.   Current Outpatient Medications (Other):    amoxicillin  (AMOXIL ) 500 MG capsule, Take 2,000 mg by mouth See admin instructions. Take 4 capsules (2000 mg) by mouth 1 hour prior to dental appointments.   calcium  carbonate  (OS-CAL - DOSED IN MG OF ELEMENTAL CALCIUM ) 1250 (500 Ca) MG tablet, Take 1 tablet by mouth daily with breakfast.   Cholecalciferol  50 MCG (2000 UT) TABS, Take 1 tablet (2,000 Units total) by mouth daily.   MAGNESIUM GLYCINATE PO, Take 240 mg by mouth at bedtime.   Multiple Vitamin (MULTIVITAMIN WITH MINERALS) TABS tablet, Take 1 tablet by mouth daily. Centrum Silver   pantoprazole  (PROTONIX ) 40 MG tablet, Take 1 tablet (40 mg total) by mouth daily.   Suvorexant  (BELSOMRA ) 15 MG  TABS, Take 1 tablet by mouth at bedtime as needed.   valACYclovir  (VALTREX ) 1000 MG tablet, Take 0.5 tablets (500 mg total) by mouth daily.   cephALEXin  (KEFLEX ) 500 MG capsule, Take 1 capsule (500 mg total) by mouth at bedtime.   erythromycin ophthalmic ointment, Place 1 Application into both eyes every 6 (six) hours.   Reviewed prior external information including notes and imaging from  primary care provider As well as notes that were available from care everywhere and other healthcare systems.  Past medical history, social, surgical and family history all reviewed in electronic medical record.  No pertanent information unless stated regarding to the chief complaint.   Review of Systems:  No headache, visual changes, nausea, vomiting, diarrhea, constipation, dizziness, abdominal pain, skin rash, fevers, chills, night sweats, weight loss, swollen lymph nodes, body aches, joint swelling, chest pain, shortness of breath, mood changes. POSITIVE muscle aches  Objective  Blood pressure 122/82, pulse 76, height 6\' 1"  (1.854 m), SpO2 97%.   General: No apparent distress alert and oriented x3 mood and affect normal, dressed appropriately.  HEENT: Pupils equal, extraocular movements intact  Respiratory: Patient's speak in full sentences and does not appear short of breath  Cardiovascular: No lower extremity edema, non tender, no erythema  Elbow exam shows shows nontender on exam today.  Neck exam does have some mild loss  of lordosis.  Patient's right shoulder does have positive impingement noted.  Seems to be worse with O'Brien's test than anything else.  No weakness of the rotator cuff noted today.  Limited muscular skeletal ultrasound was performed and interpreted by Ronnell Coins, M  Limited ultrasound of patient's right shoulder does show that patient does have some scarring noted of the rotator cuff.  Hypoechoic changes of the bicep tendon noted fairly significantly. Impression: Questionable anterior labral pathology with bicep tendinitis    [id number]; 15 additional minutes spent for Therapeutic exercises as stated in above notes.  This included exercises focusing on stretching, strengthening, with significant focus on eccentric aspects.   Long term goals include an improvement in range of motion, strength, endurance as well as avoiding reinjury. Patient's frequency would include in 1-2 times a day, 3-5 times a week for a duration of 6-12 weeks. Shoulder Exercises that included:  Basic scapular stabilization to include adduction and depression of scapula Scaption, focusing on proper movement and good control Internal and External rotation utilizing a theraband, with elbow tucked at side entire time Rows with theraband    Proper technique shown and discussed handout in great detail with ATC.  All questions were discussed and answered.   Impression and Recommendations:    The above documentation has been reviewed and is accurate and complete Velma Agnes M Michaelyn Wall, DO

## 2024-05-18 ENCOUNTER — Encounter: Payer: Self-pay | Admitting: Family Medicine

## 2024-05-18 ENCOUNTER — Other Ambulatory Visit: Payer: Self-pay

## 2024-05-18 ENCOUNTER — Ambulatory Visit (INDEPENDENT_AMBULATORY_CARE_PROVIDER_SITE_OTHER)

## 2024-05-18 ENCOUNTER — Ambulatory Visit: Admitting: Family Medicine

## 2024-05-18 VITALS — BP 122/82 | HR 76 | Ht 73.0 in

## 2024-05-18 DIAGNOSIS — M25511 Pain in right shoulder: Secondary | ICD-10-CM | POA: Diagnosis not present

## 2024-05-18 DIAGNOSIS — Z981 Arthrodesis status: Secondary | ICD-10-CM | POA: Diagnosis not present

## 2024-05-18 DIAGNOSIS — M25521 Pain in right elbow: Secondary | ICD-10-CM | POA: Diagnosis not present

## 2024-05-18 DIAGNOSIS — G8929 Other chronic pain: Secondary | ICD-10-CM

## 2024-05-18 DIAGNOSIS — M542 Cervicalgia: Secondary | ICD-10-CM

## 2024-05-18 DIAGNOSIS — M19011 Primary osteoarthritis, right shoulder: Secondary | ICD-10-CM | POA: Diagnosis not present

## 2024-05-18 DIAGNOSIS — Z4789 Encounter for other orthopedic aftercare: Secondary | ICD-10-CM | POA: Diagnosis not present

## 2024-05-18 NOTE — Assessment & Plan Note (Signed)
 Past medical history significant for surgery previously.  Discussed with patient with icing regimen of home exercises, discussed which activities to do and which ones to avoid.  Increase activity slowly.  Discussed icing regimen and home exercises.  Follow-up again in 6 to 8 weeks otherwise.

## 2024-05-18 NOTE — Patient Instructions (Addendum)
 Do prescribed exercises at least 3x a week Arm compression with activity Xrays today Ice before bed See you again in 2 months

## 2024-05-24 ENCOUNTER — Ambulatory Visit: Payer: Self-pay | Admitting: Family Medicine

## 2024-06-01 ENCOUNTER — Ambulatory Visit: Admitting: Pulmonary Disease

## 2024-06-01 ENCOUNTER — Encounter: Payer: Self-pay | Admitting: Pulmonary Disease

## 2024-06-01 VITALS — BP 134/80 | HR 59 | Ht 73.0 in | Wt 191.2 lb

## 2024-06-01 DIAGNOSIS — R0602 Shortness of breath: Secondary | ICD-10-CM | POA: Diagnosis not present

## 2024-06-01 MED ORDER — TIOTROPIUM BROMIDE MONOHYDRATE 18 MCG IN CAPS: 18.0000 ug | ORAL_CAPSULE | Freq: Every day | RESPIRATORY_TRACT | 3 refills | Status: DC

## 2024-06-01 NOTE — Patient Instructions (Signed)
 I will see you back in about 2 months  Prescription for Spiriva has been sent to pharmacy for you - Spiriva is once a day -This may help your breathing  Continue graded exercises as tolerated  We will obtain a breathing study at the next visit  Call us  with significant concerns

## 2024-06-01 NOTE — Progress Notes (Signed)
 Jonathan Young    [id number]    07-Sep-1951  Primary Care Physician:Jones, Randa Burton, MD  Referring Physician: Arcadio Knuckles, MD [address] [place],  [place] [address]  Chief complaint:   Patient being seen for an abnormal chest CT-lung nodules/emphysema  HPI:  Patient had a CT scan of the chest showing lung nodules, emphysema Had had a CT scan done about 2 years 9 months ago showing similar nodules with no significant changes He does have emphysematous changes  He is a reformed smoker quit smoking about 43 years ago in 1982 was smoking about 1 to 3 packs a day  Is very active, weight training, walking  History of irregular heartbeats, joint stiffness  He does get short of breath and does have occasional cough especially in the evenings  Does not feel limited with activities  Had neck and back surgery in 2019  Had a TURP done in March 2025  He feels well otherwise  Outpatient Encounter Medications as of 06/01/2024  Medication Sig   amoxicillin  (AMOXIL ) 500 MG capsule Take 2,000 mg by mouth See admin instructions. Take 4 capsules (2000 mg) by mouth 1 hour prior to dental appointments.   calcium  carbonate (OS-CAL - DOSED IN MG OF ELEMENTAL CALCIUM ) 1250 (500 Ca) MG tablet Take 1 tablet by mouth daily with breakfast.   Cholecalciferol  50 MCG (2000 UT) TABS Take 1 tablet (2,000 Units total) by mouth daily.   MAGNESIUM GLYCINATE PO Take 240 mg by mouth at bedtime.   Multiple Vitamin (MULTIVITAMIN WITH MINERALS) TABS tablet Take 1 tablet by mouth daily. Centrum Silver   pantoprazole  (PROTONIX ) 40 MG tablet Take 1 tablet (40 mg total) by mouth daily.   rosuvastatin  (CRESTOR ) 10 MG tablet TAKE 1 TABLET(10 MG) BY MOUTH DAILY   Suvorexant  (BELSOMRA ) 15 MG TABS Take 1 tablet by mouth at bedtime as needed.   tadalafil  (CIALIS ) 20 MG tablet Take 1 tablet (20 mg total) by mouth See admin instructions. Take 20 mg by mouth one hour prior to intercourse- maximum of 20 mg (1  tablet) in 24 hours   tiotropium (SPIRIVA) 18 MCG inhalation capsule Place 1 capsule (18 mcg total) into inhaler and inhale daily.   valACYclovir  (VALTREX ) 1000 MG tablet Take 0.5 tablets (500 mg total) by mouth daily.   cephALEXin  (KEFLEX ) 500 MG capsule Take 1 capsule (500 mg total) by mouth at bedtime.   erythromycin ophthalmic ointment Place 1 Application into both eyes every 6 (six) hours.   traMADol (ULTRAM) 50 MG tablet Take 50 mg by mouth every 6 (six) hours as needed.   No facility-administered encounter medications on file as of 06/01/2024.    Allergies as of 06/01/2024 - Review Complete 06/01/2024  Allergen Reaction Noted   Vytorin [ezetimibe-simvastatin] Other (See Comments)     Past Medical History:  Diagnosis Date   Complication of anesthesia    sore throat from tube   COPD (chronic obstructive pulmonary disease) (HCC)    Emphysema   Dysrhythmia    PVC'S   GERD (gastroesophageal reflux disease)    Hypercholesteremia    Hypertension    Neuromuscular disorder (HCC) 2005   had right foot-drop, no etiology found, resolved after steroids   Osteoarthritis    Umbilical hernia    Vertigo     Past Surgical History:  Procedure Laterality Date   ANTERIOR CERVICAL DECOMP/DISCECTOMY FUSION  2018   C5-6 and C6-7   done by Dr. Nudleman  BROW LIFT AND BLEPHAROPLASTY Bilateral 2025   CARPAL TUNNEL RELEASE Left 05/23/2018   CARPAL TUNNEL RELEASE Right 11/22/2018   CERVICAL SPINE SURGERY  2019   ACDF   HEMORRHOID SURGERY  1982   HERNIA REPAIR  2021   umbilical hernia wuth mesh   INSERTION OF MESH N/A 07/27/2020   Procedure: INSERTION OF MESH;  Surgeon: Sim Dryer, MD;  Location: [facility];  Service: General;  Laterality: N/A;   SHOULDER SURGERY     BIL   TONSILLECTOMY AND ADENOIDECTOMY     TOTAL HIP ARTHROPLASTY     RT TOAL HIP   TOTAL HIP ARTHROPLASTY  06/01/2012   Procedure: TOTAL HIP ARTHROPLASTY ANTERIOR APPROACH;  Surgeon: Bevin Bucks, MD;   Location: [facility];  Service: Orthopedics;  Laterality: Left;   TRANSURETHRAL RESECTION OF PROSTATE N/A 02/27/2024   Procedure: TURP (TRANSURETHRAL RESECTION OF PROSTATE);  Surgeon: Christina Coyer, MD;  Location: [facility];  Service: Urology;  Laterality: N/A;  60 MINUTE CASE   UMBILICAL HERNIA REPAIR N/A 07/27/2020   Procedure: UMBILICAL HERNIA REPAIR WITH MESH;  Surgeon: Sim Dryer, MD;  Location: [facility];  Service: General;  Laterality: N/A;   WISDOM TOOTH EXTRACTION      Family History  Problem Relation Age of Onset   Emphysema Mother    Colon cancer Neg Hx    Esophageal cancer Neg Hx    Rectal cancer Neg Hx    Stomach cancer Neg Hx     Social History   Socioeconomic History   Marital status: Single    Spouse name: Not on file   Number of children: Not on file   Years of education: Not on file   Highest education level: Bachelor's degree (e.g., BA, AB, BS)  Occupational History   Not on file  Tobacco Use   Smoking status: Former    Current packs/day: 0.00    Types: Cigarettes    Quit date: 05/27/1981    Years since quitting: 43.0    Passive exposure: Never   Smokeless tobacco: Never  Vaping Use   Vaping status: Never Used  Substance and Sexual Activity   Alcohol use: Yes    Alcohol/week: 20.0 standard drinks of alcohol    Types: 20 Shots of liquor per week    Comment: wine nightly   Drug use: No   Sexual activity: Yes    Partners: Male  Other Topics Concern   Not on file  Social History Narrative   Not on file   Social Drivers of Health   Financial Resource Strain: Low Risk  (12/21/2023)   Overall Financial Resource Strain (CARDIA)    Difficulty of Paying Living Expenses: Not hard at all  Food Insecurity: No Food Insecurity (12/21/2023)   Hunger Vital Sign    Worried About Running Out of Food in the Last Year: Never true    Ran Out of Food in the Last Year: Never true  Transportation Needs: No Transportation Needs (12/21/2023)   PRAPARE  - Administrator, Civil Service (Medical): No    Lack of Transportation (Non-Medical): No  Physical Activity: Insufficiently Active (12/21/2023)   Exercise Vital Sign    Days of Exercise per Week: 3 days    Minutes of Exercise per Session: 40 min  Stress: Stress Concern Present (12/21/2023)   [employer] of Occupational Health - Occupational Stress Questionnaire    Feeling of Stress : Rather much  Social Connections: Moderately Isolated (12/21/2023)  Social Advertising account executive [NHANES]    Frequency of Communication with Friends and Family: More than three times a week    Frequency of Social Gatherings with Friends and Family: More than three times a week    Attends Religious Services: Never    Database administrator or Organizations: No    Attends Banker Meetings: Never    Marital Status: Living with partner  Intimate Partner Violence: Not At Risk (11/11/2023)   Humiliation, Afraid, Rape, and Kick questionnaire    Fear of Current or Ex-Partner: No    Emotionally Abused: No    Physically Abused: No    Sexually Abused: No    Review of Systems  Respiratory:  Positive for cough and shortness of breath.     Vitals:   06/01/24 1111  BP: 134/80  Pulse: (!) 59  SpO2: 97%     Physical Exam Constitutional:      Appearance: Normal appearance.  HENT:     Head: Normocephalic.     Mouth/Throat:     Mouth: Mucous membranes are moist.  Eyes:     General: No scleral icterus. Cardiovascular:     Rate and Rhythm: Normal rate and regular rhythm.     Heart sounds: No murmur heard.    No friction rub.  Pulmonary:     Effort: No respiratory distress.     Breath sounds: No stridor. No wheezing or rhonchi.  Musculoskeletal:     Cervical back: No rigidity or tenderness.  Neurological:     Mental Status: He is alert.  Psychiatric:        Mood and Affect: Mood normal.    Data Reviewed: CT scan was reviewed with the patient showing multiple  lung nodules Stable compared with previous, emphysematous changes  Assessment:  Obstructive lung disease  Cough Shortness of breath  Abnormal CT with multiple lung nodules  He quit smoking over 40 years ago so his risk profile relating to smoking is low  Plan/Recommendations: The lung nodules do not need any further follow-up-stable from a CAT scan done about 3 years ago  Obtain a pulmonary function test  Will provide a prescription for Spiriva to be used once a day  Albuterol  can be considered  Follow-up in about 2 months  Encouraged to call with any significant concerns    Myer Artis MD [facility] 06/01/2024, 1:24 PM  CC: Arcadio Knuckles, MD

## 2024-06-05 ENCOUNTER — Ambulatory Visit (HOSPITAL_COMMUNITY): Payer: Self-pay

## 2024-06-28 ENCOUNTER — Other Ambulatory Visit: Payer: Self-pay | Admitting: Internal Medicine

## 2024-06-28 DIAGNOSIS — K219 Gastro-esophageal reflux disease without esophagitis: Secondary | ICD-10-CM

## 2024-07-16 NOTE — Progress Notes (Signed)
 [facility] [address] [place] [address] Phone: [phone] Subjective:   ISusannah Gully, am serving as a scribe for Dr. Arthea Claudene.  I'm seeing this patient by the request  of:  Joshua Debby CROME, MD  CC: Right shoulder pain  YEP:Dlagzrupcz  05/18/2024 Past medical history significant for surgery previously.  Discussed with patient with icing regimen of home exercises, discussed which activities to do and which ones to avoid.  Increase activity slowly.  Discussed icing regimen and home exercises.  Follow-up again in 6 to 8 weeks otherwise.   Update 07/19/2024 Nafis Farnan Kraynak is a 73 y.o. male coming in with complaint of R shoulder pain.  Patient previously diagnosed with more of an anterior labral pathology with bicep tendinopathy.  Given exercises, x-rays were also taken that showed mild glenohumeral and acromioclavicular arthritis.  Past medical history significant for anterior fusion from C5-C7 with moderate anterior spurring at C3-C5.  Patient states elbow is giving him fits. Shoulder is doing okay. Favoring the elbow.      Past Medical History:  Diagnosis Date   Complication of anesthesia    sore throat from tube   COPD (chronic obstructive pulmonary disease) (HCC)    Emphysema   Dysrhythmia    PVC'S   GERD (gastroesophageal reflux disease)    Hypercholesteremia    Hypertension    Neuromuscular disorder (HCC) 2005   had right foot-drop, no etiology found, resolved after steroids   Osteoarthritis    Umbilical hernia    Vertigo    Past Surgical History:  Procedure Laterality Date   ANTERIOR CERVICAL DECOMP/DISCECTOMY FUSION  2018   C5-6 and C6-7   done by Dr. Mora LANES LIFT AND BLEPHAROPLASTY Bilateral 2025   CARPAL TUNNEL RELEASE Left 05/23/2018   CARPAL TUNNEL RELEASE Right 11/22/2018   CERVICAL SPINE SURGERY  2019   ACDF   HEMORRHOID SURGERY  1982   HERNIA REPAIR  2021   umbilical hernia wuth mesh   INSERTION OF  MESH N/A 07/27/2020   Procedure: INSERTION OF MESH;  Surgeon: Vanderbilt Debby, MD;  Location: [facility];  Service: General;  Laterality: N/A;   SHOULDER SURGERY     BIL   TONSILLECTOMY AND ADENOIDECTOMY     TOTAL HIP ARTHROPLASTY     RT TOAL HIP   TOTAL HIP ARTHROPLASTY  06/01/2012   Procedure: TOTAL HIP ARTHROPLASTY ANTERIOR APPROACH;  Surgeon: Donnice JONETTA Car, MD;  Location: [facility];  Service: Orthopedics;  Laterality: Left;   TRANSURETHRAL RESECTION OF PROSTATE N/A 02/27/2024   Procedure: TURP (TRANSURETHRAL RESECTION OF PROSTATE);  Surgeon: Nieves Donnice, MD;  Location: [facility];  Service: Urology;  Laterality: N/A;  60 MINUTE CASE   UMBILICAL HERNIA REPAIR N/A 07/27/2020   Procedure: UMBILICAL HERNIA REPAIR WITH MESH;  Surgeon: Vanderbilt Debby, MD;  Location: [facility];  Service: General;  Laterality: N/A;   WISDOM TOOTH EXTRACTION     Social History   Socioeconomic History   Marital status: Single    Spouse name: Not on file   Number of children: Not on file   Years of education: Not on file   Highest education level: Bachelor's degree (e.g., BA, AB, BS)  Occupational History   Not on file  Tobacco Use   Smoking status: Former    Current packs/day: 0.00    Types: Cigarettes    Quit date: 05/27/1981    Years since quitting: 40.1  Passive exposure: Never   Smokeless tobacco: Never  Vaping Use   Vaping status: Never Used  Substance and Sexual Activity   Alcohol use: Yes    Alcohol/week: 20.0 standard drinks of alcohol    Types: 20 Shots of liquor per week    Comment: wine nightly   Drug use: No   Sexual activity: Yes    Partners: Male  Other Topics Concern   Not on file  Social History Narrative   Not on file   Social Drivers of Health   Financial Resource Strain: Low Risk  (12/21/2023)   Overall Financial Resource Strain (CARDIA)    Difficulty of Paying Living Expenses: Not hard at all  Food Insecurity: No Food Insecurity  (12/21/2023)   Hunger Vital Sign    Worried About Running Out of Food in the Last Year: Never true    Ran Out of Food in the Last Year: Never true  Transportation Needs: No Transportation Needs (12/21/2023)   PRAPARE - Administrator, Civil Service (Medical): No    Lack of Transportation (Non-Medical): No  Physical Activity: Insufficiently Active (12/21/2023)   Exercise Vital Sign    Days of Exercise per Week: 3 days    Minutes of Exercise per Session: 40 min  Stress: Stress Concern Present (12/21/2023)   [employer] of Occupational Health - Occupational Stress Questionnaire    Feeling of Stress : Rather much  Social Connections: Moderately Isolated (12/21/2023)   Social Connection and Isolation Panel    Frequency of Communication with Friends and Family: More than three times a week    Frequency of Social Gatherings with Friends and Family: More than three times a week    Attends Religious Services: Never    Database administrator or Organizations: No    Attends Engineer, structural: Never    Marital Status: Living with partner   Allergies  Allergen Reactions   Vytorin [Ezetimibe-Simvastatin] Other (See Comments)    Muscle aches   Family History  Problem Relation Age of Onset   Emphysema Mother    Colon cancer Neg Hx    Esophageal cancer Neg Hx    Rectal cancer Neg Hx    Stomach cancer Neg Hx      Current Outpatient Medications (Cardiovascular):    rosuvastatin  (CRESTOR ) 10 MG tablet, TAKE 1 TABLET(10 MG) BY MOUTH DAILY   tadalafil  (CIALIS ) 20 MG tablet, Take 1 tablet (20 mg total) by mouth See admin instructions. Take 20 mg by mouth one hour prior to intercourse- maximum of 20 mg (1 tablet) in 24 hours  Current Outpatient Medications (Respiratory):    tiotropium (SPIRIVA ) 18 MCG inhalation capsule, Place 1 capsule (18 mcg total) into inhaler and inhale daily.    Current Outpatient Medications (Other):    amoxicillin  (AMOXIL ) 500 MG capsule,  Take 2,000 mg by mouth See admin instructions. Take 4 capsules (2000 mg) by mouth 1 hour prior to dental appointments.   calcium  carbonate (OS-CAL - DOSED IN MG OF ELEMENTAL CALCIUM ) 1250 (500 Ca) MG tablet, Take 1 tablet by mouth daily with breakfast.   Cholecalciferol  50 MCG (2000 UT) TABS, Take 1 tablet (2,000 Units total) by mouth daily.   MAGNESIUM GLYCINATE PO, Take 240 mg by mouth at bedtime.   Multiple Vitamin (MULTIVITAMIN WITH MINERALS) TABS tablet, Take 1 tablet by mouth daily. Centrum Silver   pantoprazole  (PROTONIX ) 40 MG tablet, Take 1 tablet (40 mg total) by mouth daily.   Suvorexant  (BELSOMRA ) 15  MG TABS, Take 1 tablet by mouth at bedtime as needed.   valACYclovir  (VALTREX ) 1000 MG tablet, Take 0.5 tablets (500 mg total) by mouth daily.   Reviewed prior external information including notes and imaging from  primary care provider As well as notes that were available from care everywhere and other healthcare systems.  Past medical history, social, surgical and family history all reviewed in electronic medical record.  No pertanent information unless stated regarding to the chief complaint.   Review of Systems:  No headache, visual changes, nausea, vomiting, diarrhea, constipation, dizziness, abdominal pain, skin rash, fevers, chills, night sweats, weight loss, swollen lymph nodes, body aches, joint swelling, chest pain, shortness of breath, mood changes. POSITIVE muscle aches  Objective  Blood pressure 114/76, pulse 86, height 6' 1 (1.854 m), weight 189 lb (85.7 kg), SpO2 97%.   General: No apparent distress alert and oriented x3 mood and affect normal, dressed appropriately.  HEENT: Pupils equal, extraocular movements intact  Respiratory: Patient's speak in full sentences and does not appear short of breath  Cardiovascular: No lower extremity edema, non tender, no erythema  Right shoulder exam shows good range of motion noted.  Patient is having tenderness of more right  elbow.   Limited muscular skeletal ultrasound was performed and interpreted by CLAUDENE HUSSAR, M  Right elbow shows that there is hypoechoic changes that seems to be a partial tear that is high-grade with some mild retraction questionable avulsion noted.  Lateral epicondylar area.   Impression and Recommendations:     The above documentation has been reviewed and is accurate and complete Uzma Hellmer M Aubrianna Orchard, DO

## 2024-07-19 ENCOUNTER — Ambulatory Visit: Admitting: Family Medicine

## 2024-07-19 ENCOUNTER — Other Ambulatory Visit: Payer: Self-pay

## 2024-07-19 ENCOUNTER — Encounter: Payer: Self-pay | Admitting: Family Medicine

## 2024-07-19 VITALS — BP 114/76 | HR 86 | Ht 73.0 in | Wt 189.0 lb

## 2024-07-19 DIAGNOSIS — M25521 Pain in right elbow: Secondary | ICD-10-CM | POA: Diagnosis not present

## 2024-07-19 DIAGNOSIS — M25511 Pain in right shoulder: Secondary | ICD-10-CM

## 2024-07-19 DIAGNOSIS — M7711 Lateral epicondylitis, right elbow: Secondary | ICD-10-CM

## 2024-07-19 NOTE — Patient Instructions (Signed)
 Good to see you  PRP could be good Go back to the brace day and night for 1 week then nightly for 2 weeks Ice after activity. No overhead lifting. See me again in 6 weeks and if not improved will consider PRP. Make appointment with physical therapy upfront

## 2024-07-19 NOTE — Assessment & Plan Note (Addendum)
 Patient has had now what appears to be more of a partial tear noted.  Discussed with patient about the possibility of PRP.  I went back to the bracing at the moment.  Discussed icing regimen.  Worsening pain advanced imaging could be warranted as well.  Will also start formal physical therapy.  Will follow-up again in 6 to 8 weeks

## 2024-07-27 DIAGNOSIS — T1490XD Injury, unspecified, subsequent encounter: Secondary | ICD-10-CM | POA: Diagnosis not present

## 2024-07-27 DIAGNOSIS — C44722 Squamous cell carcinoma of skin of right lower limb, including hip: Secondary | ICD-10-CM | POA: Diagnosis not present

## 2024-07-27 DIAGNOSIS — L820 Inflamed seborrheic keratosis: Secondary | ICD-10-CM | POA: Diagnosis not present

## 2024-07-27 DIAGNOSIS — D485 Neoplasm of uncertain behavior of skin: Secondary | ICD-10-CM | POA: Diagnosis not present

## 2024-07-28 NOTE — Therapy (Signed)
 OUTPATIENT PHYSICAL THERAPY EVALUATION   Patient Name: Jonathan Young MRN: [id number] DOB:03-17-1951, 73 y.o., male Today's Date: 07/30/2024   END OF SESSION:  PT End of Session - 07/30/24 0946     Visit Number 1    Number of Visits 9    Date for PT Re-Evaluation 09/24/24    Authorization Type [employer]    Progress Note Due on Visit 10    PT Start Time 0935    PT Stop Time 1025    PT Time Calculation (min) 50 min    Activity Tolerance Patient tolerated treatment well    Behavior During Therapy WFL for tasks assessed/performed          Past Medical History:  Diagnosis Date   Complication of anesthesia    sore throat from tube   COPD (chronic obstructive pulmonary disease) (HCC)    Emphysema   Dysrhythmia    PVC'S   GERD (gastroesophageal reflux disease)    Hypercholesteremia    Hypertension    Neuromuscular disorder (HCC) 2005   had right foot-drop, no etiology found, resolved after steroids   Osteoarthritis    Umbilical hernia    Vertigo    Past Surgical History:  Procedure Laterality Date   ANTERIOR CERVICAL DECOMP/DISCECTOMY FUSION  2018   C5-6 and C6-7   done by Dr. Mora LANES LIFT AND BLEPHAROPLASTY Bilateral 2025   CARPAL TUNNEL RELEASE Left 05/23/2018   CARPAL TUNNEL RELEASE Right 11/22/2018   CERVICAL SPINE SURGERY  2019   ACDF   HEMORRHOID SURGERY  1982   HERNIA REPAIR  2021   umbilical hernia wuth mesh   INSERTION OF MESH N/A 07/27/2020   Procedure: INSERTION OF MESH;  Surgeon: Vanderbilt Ned, MD;  Location: [facility];  Service: General;  Laterality: N/A;   SHOULDER SURGERY     BIL   TONSILLECTOMY AND ADENOIDECTOMY     TOTAL HIP ARTHROPLASTY     RT TOAL HIP   TOTAL HIP ARTHROPLASTY  06/01/2012   Procedure: TOTAL HIP ARTHROPLASTY ANTERIOR APPROACH;  Surgeon: Donnice JONETTA Car, MD;  Location: [facility];  Service: Orthopedics;  Laterality: Left;   TRANSURETHRAL RESECTION OF PROSTATE N/A 02/27/2024   Procedure: TURP  (TRANSURETHRAL RESECTION OF PROSTATE);  Surgeon: Nieves Donnice, MD;  Location: [facility];  Service: Urology;  Laterality: N/A;  60 MINUTE CASE   UMBILICAL HERNIA REPAIR N/A 07/27/2020   Procedure: UMBILICAL HERNIA REPAIR WITH MESH;  Surgeon: Vanderbilt Ned, MD;  Location: [facility];  Service: General;  Laterality: N/A;   WISDOM TOOTH EXTRACTION     Patient Active Problem List   Diagnosis Date Noted   Right shoulder pain 05/18/2024   Right lateral epicondylitis 03/16/2024   Centrilobular emphysema (HCC) 03/15/2024   Encounter for general adult medical examination with abnormal findings 05/09/2023   LPRD (laryngopharyngeal reflux disease) 05/08/2023   Patellofemoral arthritis of right knee 08/19/2022   Psychophysiological insomnia 06/06/2020   Maxillary sinusitis, chronic 06/29/2017   CRI (chronic renal insufficiency), stage 3 (moderate) (HCC) 04/07/2017   Pure hyperglyceridemia 04/03/2017   Essential hypertension 11/07/2016   GAD (generalized anxiety disorder) 10/17/2015   Erectile dysfunction of organic origin 10/10/2015   PSA elevation 01/10/2015   GERD (gastroesophageal reflux disease) 04/23/2011   IBS (irritable bowel syndrome) 04/23/2011   Vitamin D  deficiency 01/16/2011   DUPUYTREN'S CONTRACTURE 01/16/2011   Hypogonadism male 07/25/2010   IDIOPATHIC OSTEOPOROSIS 11/01/2008   Vitamin B 12 deficiency 03/22/2008   Osteoarthritis 08/25/2007  Hyperlipidemia with target LDL less than 130 08/03/2007    PCP: Joshua Debby CROME, MD  REFERRING PROVIDER: Claudene Arthea HERO, DO  REFERRING DIAG: Right elbow pain  THERAPY DIAG:  Pain in right elbow  Muscle weakness (generalized)  Rationale for Evaluation and Treatment: Rehabilitation  ONSET DATE: Chronic, approximately 2 months ago   SUBJECTIVE:         SUBJECTIVE STATEMENT: Patient reports right elbow pain. He is wearing a wrist brace to avoid wrist extension because that hurts and lifting will also cause pain.  This first started about 2 months ago when he was lifting clothes. He did have an injection and used the wrist brace in the past but it seemed to get better, but then lifting at the gym may have aggravated it again. He does always have soreness in the morning and gets sharp pains with activities that aggravate the elbow. He is using the the wrist brace constantly.   Hand dominance: Left  PERTINENT HISTORY: See PMH above  PAIN:  Are you having pain? Yes:  NPRS scale: 0/10 at rest, 4/10 pain in the morning Pain location: Right elbow Pain description: Sore, Sharp Aggravating factors: Wrist extension Relieving factors: Rest, wrist brace  PRECAUTIONS: None  RED FLAGS: None   WEIGHT BEARING RESTRICTIONS: No  FALLS:  Has patient fallen in last 6 months? No  PLOF: Independent  PATIENT GOALS: Pain relief   OBJECTIVE:  Note: Objective measures were completed at Evaluation unless otherwise noted. PATIENT SURVEYS:  QuickDASH: 47.7% disability  COGNITION: Overall cognitive status: Within functional limits for tasks assessed     SENSATION: WFL  POSTURE: Mild rounded shoulder and forward head posture  UPPER EXTREMITY ROM:   Shoulder, elbow, wrist AROM grossly WFL and equal bilaterally  UPPER EXTREMITY MMT:  MMT Right eval Left eval  Shoulder flexion 4+ 5  Shoulder extension 5 5  Shoulder abduction 5 5  Shoulder adduction    Shoulder internal rotation 5 5  Shoulder external rotation 4+ 5  Middle trapezius    Lower trapezius    Elbow flexion 5 5  Elbow extension 5 5  Wrist flexion  5  Wrist extension 4+ 5  Wrist ulnar deviation 5 5  Wrist radial deviation 5 5  Wrist pronation 5 5  Wrist supination 5 5  Grip strength (lbs) 58 83  (Blank rows = not tested)  Wrist strength assessment performed with elbow bent  JOINT MOBILITY TESTING:  Grossly WFL  Patient reported no change in lateral elbow symptoms with lateral glide combined with gripping  PALPATION:   Tender to palpation at wrist extensor group insertion at lateral epicondyle, wrists extensor muscle group tenderness                                                                                                                             TREATMENT [facility] Adult PT Treatment:  DATE: 07/30/2024 Seated wrist extension with 3# elbow bent Seated wrist pronation/supination with arm supported and elbow straight using hammer  Discussion or gradual loading for tendon health and strengthening, gradual discontinuing of wrist brace, explained pain scale and how to grade activity/exercise based on pain tolerance (stop light analogy), expected progression and expectations with therapy  PATIENT EDUCATION: Education details: Exam findings, POC, HEP Person educated: Patient Education method: Explanation, Demonstration, Tactile cues, Verbal cues, and Handouts Education comprehension: verbalized understanding, returned demonstration, verbal cues required, tactile cues required, and needs further education  HOME EXERCISE PROGRAM: Access Code: [email or web address]   ASSESSMENT: CLINICAL IMPRESSION: Patient is a 73 y.o. male who was seen today for physical therapy evaluation and treatment for chronic right elbow pain that seems consistent with lateral epicondylagia. Majority of his symptoms occur when he is perform wrist movements while the elbow is extended. He demonstrates good strength with minimal pain with wrist strength testing with his elbow bent, but increased lateral elbow pain and weakness due to pain when his elbow is extended. He does have some grip strength deficits on the right with pain and tenderness about the wrist extensor muscle mass. Overall his range of motion is good of the shoulder, elbow, and wrist but some slight strength deficit of the right shoulder.  OBJECTIVE IMPAIRMENTS: decreased activity tolerance, decreased strength, and pain.   ACTIVITY  LIMITATIONS: carrying, lifting, and hygiene/grooming  PARTICIPATION LIMITATIONS: meal prep, cleaning, laundry, shopping, and community activity  PERSONAL FACTORS: Fitness, Past/current experiences, and Time since onset of injury/illness/exacerbation are also affecting patient's functional outcome.   REHAB POTENTIAL: Good  CLINICAL DECISION MAKING: Stable/uncomplicated  EVALUATION COMPLEXITY: Low   GOALS: Goals reviewed with patient? Yes  SHORT TERM GOALS: Target date: 08/27/2024  Patient will be I with initial HEP in order to progress with therapy. Baseline: HEP provided at eval Goal status: INITIAL  2.  Patient will report right elbow pain </= 2/10 with lifting while arm straight in order to reduce functional limitations Baseline: 5-6/10 pain Goal status: INITIAL  LONG TERM GOALS: Target date: 09/24/2024  Patient will be I with final HEP to maintain progress from PT. Baseline: HEP provided at eval Goal status: INITIAL  2.  Patient will report QuickDASH </= 20% in order to indicate an improvement in their functional status Baseline: 47.7 Goal status: INITIAL  3.  Patient will demonstrate right shoulder and wrist strength 5/5 MMT in order to improve activity tolerance and reduce pain with lifting Baseline: see limitations above Goal status: INITIAL  4.  Patient will be able to lift lighter objects around home with elbow straight without pain or limitation in order to reduce functional limitations Baseline: patient reports pain and limitation lifting objects like his keys Goal status: INITIAL   PLAN: PT FREQUENCY: 1-2x/week  PT DURATION: 8 weeks  PLANNED INTERVENTIONS: 97164- PT Re-evaluation, 97750- Physical Performance Testing, 97110-Therapeutic exercises, 97530- Therapeutic activity, 97112- Neuromuscular re-education, 97535- Self Care, 02859- Manual therapy, [phone]- Ionotophoresis 4mg /ml Dexamethasone , 79439 (1-2 muscles), 20561 (3+ muscles)- Dry Needling,  Patient/Family education, Taping, Joint mobilization, Joint manipulation, Cryotherapy, and Moist heat  PLAN FOR NEXT SESSION: Review HEP and progress PRN, manual/TPDN for right wrist extensor muscle group, trial elbow mobs with gripping/wrist extension, wrist/elbow/shoulder strengthening/loading with progression to straight elbow for activity tolerance, Ionto PRN   Elaine Daring, PT, DPT, LAT, ATC 07/30/24  10:46 AM Phone: [phone] Fax: [phone]

## 2024-07-30 ENCOUNTER — Encounter: Payer: Self-pay | Admitting: Physical Therapy

## 2024-07-30 ENCOUNTER — Other Ambulatory Visit: Payer: Self-pay

## 2024-07-30 ENCOUNTER — Ambulatory Visit: Admitting: Physical Therapy

## 2024-07-30 DIAGNOSIS — M25521 Pain in right elbow: Secondary | ICD-10-CM | POA: Diagnosis not present

## 2024-07-30 DIAGNOSIS — M6281 Muscle weakness (generalized): Secondary | ICD-10-CM

## 2024-07-30 NOTE — Patient Instructions (Signed)
 Access Code: [email or web address] URL: [email or web address] Date: 07/30/2024 Prepared by: Elaine Daring  Exercises - Seated Wrist Extension with Dumbbell  - 1 x daily - 3 sets - 10 reps - Forearm Pronation and Supination with Hammer  - 1 x daily - 3 sets - 10 reps

## 2024-08-04 ENCOUNTER — Other Ambulatory Visit: Payer: Self-pay | Admitting: Internal Medicine

## 2024-08-04 DIAGNOSIS — K219 Gastro-esophageal reflux disease without esophagitis: Secondary | ICD-10-CM

## 2024-08-08 ENCOUNTER — Other Ambulatory Visit: Payer: Self-pay | Admitting: Internal Medicine

## 2024-08-08 ENCOUNTER — Encounter: Payer: Self-pay | Admitting: Internal Medicine

## 2024-08-08 DIAGNOSIS — K219 Gastro-esophageal reflux disease without esophagitis: Secondary | ICD-10-CM

## 2024-08-09 ENCOUNTER — Encounter: Payer: Self-pay | Admitting: Physical Therapy

## 2024-08-09 ENCOUNTER — Other Ambulatory Visit: Payer: Self-pay

## 2024-08-09 ENCOUNTER — Ambulatory Visit: Admitting: Physical Therapy

## 2024-08-09 DIAGNOSIS — M25521 Pain in right elbow: Secondary | ICD-10-CM

## 2024-08-09 DIAGNOSIS — M6281 Muscle weakness (generalized): Secondary | ICD-10-CM

## 2024-08-09 NOTE — Telephone Encounter (Signed)
 He needs to be seen

## 2024-08-09 NOTE — Therapy (Signed)
 OUTPATIENT PHYSICAL THERAPY TREATMENT   Patient Name: Jonathan Young MRN: [id number] DOB:1951/09/11, 73 y.o., male Today's Date: 08/09/2024   END OF SESSION:  PT End of Session - 08/09/24 0851     Visit Number 2    Number of Visits 9    Date for PT Re-Evaluation 09/24/24    Authorization Type [employer]    Progress Note Due on Visit 10    PT Start Time 0847    PT Stop Time 0935    PT Time Calculation (min) 48 min    Activity Tolerance Patient tolerated treatment well    Behavior During Therapy WFL for tasks assessed/performed           Past Medical History:  Diagnosis Date   Complication of anesthesia    sore throat from tube   COPD (chronic obstructive pulmonary disease) (HCC)    Emphysema   Dysrhythmia    PVC'S   GERD (gastroesophageal reflux disease)    Hypercholesteremia    Hypertension    Neuromuscular disorder (HCC) 2005   had right foot-drop, no etiology found, resolved after steroids   Osteoarthritis    Umbilical hernia    Vertigo    Past Surgical History:  Procedure Laterality Date   ANTERIOR CERVICAL DECOMP/DISCECTOMY FUSION  2018   C5-6 and C6-7   done by Dr. Mora LANES LIFT AND BLEPHAROPLASTY Bilateral 2025   CARPAL TUNNEL RELEASE Left 05/23/2018   CARPAL TUNNEL RELEASE Right 11/22/2018   CERVICAL SPINE SURGERY  2019   ACDF   HEMORRHOID SURGERY  1982   HERNIA REPAIR  2021   umbilical hernia wuth mesh   INSERTION OF MESH N/A 07/27/2020   Procedure: INSERTION OF MESH;  Surgeon: Vanderbilt Ned, MD;  Location: [facility];  Service: General;  Laterality: N/A;   SHOULDER SURGERY     BIL   TONSILLECTOMY AND ADENOIDECTOMY     TOTAL HIP ARTHROPLASTY     RT TOAL HIP   TOTAL HIP ARTHROPLASTY  06/01/2012   Procedure: TOTAL HIP ARTHROPLASTY ANTERIOR APPROACH;  Surgeon: Donnice JONETTA Car, MD;  Location: [facility];  Service: Orthopedics;  Laterality: Left;   TRANSURETHRAL RESECTION OF PROSTATE N/A 02/27/2024   Procedure: TURP  (TRANSURETHRAL RESECTION OF PROSTATE);  Surgeon: Nieves Donnice, MD;  Location: [facility];  Service: Urology;  Laterality: N/A;  60 MINUTE CASE   UMBILICAL HERNIA REPAIR N/A 07/27/2020   Procedure: UMBILICAL HERNIA REPAIR WITH MESH;  Surgeon: Vanderbilt Ned, MD;  Location: [facility];  Service: General;  Laterality: N/A;   WISDOM TOOTH EXTRACTION     Patient Active Problem List   Diagnosis Date Noted   Right shoulder pain 05/18/2024   Right lateral epicondylitis 03/16/2024   Centrilobular emphysema (HCC) 03/15/2024   Encounter for general adult medical examination with abnormal findings 05/09/2023   LPRD (laryngopharyngeal reflux disease) 05/08/2023   Patellofemoral arthritis of right knee 08/19/2022   Psychophysiological insomnia 06/06/2020   Maxillary sinusitis, chronic 06/29/2017   CRI (chronic renal insufficiency), stage 3 (moderate) (HCC) 04/07/2017   Pure hyperglyceridemia 04/03/2017   Essential hypertension 11/07/2016   GAD (generalized anxiety disorder) 10/17/2015   Erectile dysfunction of organic origin 10/10/2015   PSA elevation 01/10/2015   GERD (gastroesophageal reflux disease) 04/23/2011   IBS (irritable bowel syndrome) 04/23/2011   Vitamin D  deficiency 01/16/2011   DUPUYTREN'S CONTRACTURE 01/16/2011   Hypogonadism male 07/25/2010   IDIOPATHIC OSTEOPOROSIS 11/01/2008   Vitamin B 12 deficiency 03/22/2008   Osteoarthritis  08/25/2007   Hyperlipidemia with target LDL less than 130 08/03/2007    PCP: Joshua Debby CROME, MD  REFERRING PROVIDER: Claudene Arthea HERO, DO  REFERRING DIAG: Right elbow pain  THERAPY DIAG:  Pain in right elbow  Muscle weakness (generalized)  Rationale for Evaluation and Treatment: Rehabilitation  ONSET DATE: Chronic, approximately 2 months ago   SUBJECTIVE:         SUBJECTIVE STATEMENT: Patient reports he is doing well. He is consistent with his exercises and still going to the gym.  Eval: Patient reports right elbow  pain. He is wearing a wrist brace to avoid wrist extension because that hurts and lifting will also cause pain. This first started about 2 months ago when he was lifting clothes. He did have an injection and used the wrist brace in the past but it seemed to get better, but then lifting at the gym may have aggravated it again. He does always have soreness in the morning and gets sharp pains with activities that aggravate the elbow. He is using the the wrist brace constantly.   Hand dominance: Left  PERTINENT HISTORY: See PMH above  PAIN:  Are you having pain? Yes:  NPRS scale: 0/10 at rest, 4/10 pain in the morning Pain location: Right elbow Pain description: Sore, Sharp Aggravating factors: Wrist extension Relieving factors: Rest, wrist brace  PRECAUTIONS: None  PATIENT GOALS: Pain relief   OBJECTIVE:  Note: Objective measures were completed at Evaluation unless otherwise noted. PATIENT SURVEYS:  QuickDASH: 47.7% disability  POSTURE: Mild rounded shoulder and forward head posture  UPPER EXTREMITY ROM:   Shoulder, elbow, wrist AROM grossly WFL and equal bilaterally  UPPER EXTREMITY MMT:  MMT Right eval Left eval  Shoulder flexion 4+ 5  Shoulder extension 5 5  Shoulder abduction 5 5  Shoulder adduction    Shoulder internal rotation 5 5  Shoulder external rotation 4+ 5  Middle trapezius    Lower trapezius    Elbow flexion 5 5  Elbow extension 5 5  Wrist flexion  5  Wrist extension 4+ 5  Wrist ulnar deviation 5 5  Wrist radial deviation 5 5  Wrist pronation 5 5  Wrist supination 5 5  Grip strength (lbs) 58 83  (Blank rows = not tested)  Wrist strength assessment performed with elbow bent  JOINT MOBILITY TESTING:  Grossly WFL  Patient reported no change in lateral elbow symptoms with lateral glide combined with gripping  PALPATION:  Tender to palpation at wrist extensor group insertion at lateral epicondyle, wrists extensor muscle group tenderness                                                                                                                              TREATMENT [facility] Adult PT Treatment:  DATE: 08/09/2024 UBE L3 x 5 min (fwd/bwd) to improve endurance and workload capacity STM for right wrist extensor group Seated wrist extension with 3# elbow bent x 10 Seated bicep curl in supination, neutral, pronation x 8 each Seated shoulder flexion in pronation with 4# 5 x 15 sec  Trigger Point Dry Needling  Initial Treatment: Pt instructed on Dry Needling rational, procedures, and possible side effects. Pt instructed to expect mild to moderate muscle soreness later in the day and/or into the next day.  Pt instructed in methods to reduce muscle soreness. Pt instructed to continue prescribed HEP. Because Dry Needling was performed over or adjacent to a lung field, pt was educated on S/S of pneumothorax and to seek immediate medical attention should they occur.  Patient was educated on signs and symptoms of infection and other risk factors and advised to seek medical attention should they occur.  Patient verbalized understanding of these instructions and education.   Patient Verbal Consent Given: Yes Education Handout Provided: No patient declined Muscles Treated: Right wrist extensor group Electrical Stimulation Performed: No Treatment Response/Outcome: Twitch response  Discussed goal of therapy to progress load in order to build tendon tolerance and strength. Discussed pain scale and allowable pain range with exercise.   PATIENT EDUCATION: Education details: HEP update Person educated: Patient Education method: Explanation, Demonstration, Tactile cues, Verbal cues, and Handouts Education comprehension: verbalized understanding, returned demonstration, verbal cues required, tactile cues required, and needs further education  HOME EXERCISE PROGRAM: Access Code:  [email or web address]   ASSESSMENT: CLINICAL IMPRESSION: Patient tolerated therapy well with no adverse effects. Performed TPDN and manual for the right wrist extensor group with good therapeutic benefit. Therapy focused on progressing loading for the right wrist extensor group and incorporated isometrics with good tolerance. He does report continued lateral right elbow pain but this is minimal with exercise. Updated his HEP to include isometrics and continue loading. Patient would benefit from continued skilled PT to progress mobility and strength in order to reduce pain and maximize functional ability.   Eval: Patient is a 73 y.o. male who was seen today for physical therapy evaluation and treatment for chronic right elbow pain that seems consistent with lateral epicondylagia. Majority of his symptoms occur when he is perform wrist movements while the elbow is extended. He demonstrates good strength with minimal pain with wrist strength testing with his elbow bent, but increased lateral elbow pain and weakness due to pain when his elbow is extended. He does have some grip strength deficits on the right with pain and tenderness about the wrist extensor muscle mass. Overall his range of motion is good of the shoulder, elbow, and wrist but some slight strength deficit of the right shoulder.  OBJECTIVE IMPAIRMENTS: decreased activity tolerance, decreased strength, and pain.   ACTIVITY LIMITATIONS: carrying, lifting, and hygiene/grooming  PARTICIPATION LIMITATIONS: meal prep, cleaning, laundry, shopping, and community activity  PERSONAL FACTORS: Fitness, Past/current experiences, and Time since onset of injury/illness/exacerbation are also affecting patient's functional outcome.    GOALS: Goals reviewed with patient? Yes  SHORT TERM GOALS: Target date: 08/27/2024  Patient will be I with initial HEP in order to progress with therapy. Baseline: HEP provided at eval Goal status: INITIAL  2.  Patient will  report right elbow pain </= 2/10 with lifting while arm straight in order to reduce functional limitations Baseline: 5-6/10 pain Goal status: INITIAL  LONG TERM GOALS: Target date: 09/24/2024  Patient will be I with final HEP to maintain progress from PT. Baseline:  HEP provided at eval Goal status: INITIAL  2.  Patient will report QuickDASH </= 20% in order to indicate an improvement in their functional status Baseline: 47.7 Goal status: INITIAL  3.  Patient will demonstrate right shoulder and wrist strength 5/5 MMT in order to improve activity tolerance and reduce pain with lifting Baseline: see limitations above Goal status: INITIAL  4.  Patient will be able to lift lighter objects around home with elbow straight without pain or limitation in order to reduce functional limitations Baseline: patient reports pain and limitation lifting objects like his keys Goal status: INITIAL   PLAN: PT FREQUENCY: 1-2x/week  PT DURATION: 8 weeks  PLANNED INTERVENTIONS: 97164- PT Re-evaluation, 97750- Physical Performance Testing, 97110-Therapeutic exercises, 97530- Therapeutic activity, 97112- Neuromuscular re-education, 97535- Self Care, 02859- Manual therapy, [phone]- Ionotophoresis 4mg /ml Dexamethasone , 79439 (1-2 muscles), 20561 (3+ muscles)- Dry Needling, Patient/Family education, Taping, Joint mobilization, Joint manipulation, Cryotherapy, and Moist heat  PLAN FOR NEXT SESSION: Review HEP and progress PRN, manual/TPDN for right wrist extensor muscle group, trial elbow mobs with gripping/wrist extension, wrist/elbow/shoulder strengthening/loading with progression to straight elbow for activity tolerance, Ionto PRN   Elaine Daring, PT, DPT, LAT, ATC 08/09/24  9:58 AM Phone: [phone] Fax: [phone]

## 2024-08-09 NOTE — Patient Instructions (Signed)
 Access Code: [email or web address] URL: [email or web address] Date: 08/09/2024 Prepared by: Elaine Daring  Exercises - Seated Wrist Extension with Dumbbell  - 1 x daily - 3 sets - 10 reps - Forearm Pronation and Supination with Hammer  - 1 x daily - 3 sets - 10 reps - Standing Shoulder Flexion with Dumbbells  - 1 x daily - 3 sets - 5 reps - 15 seconds hold

## 2024-08-16 ENCOUNTER — Encounter: Payer: Self-pay | Admitting: Physical Therapy

## 2024-08-16 ENCOUNTER — Ambulatory Visit (INDEPENDENT_AMBULATORY_CARE_PROVIDER_SITE_OTHER): Admitting: Physical Therapy

## 2024-08-16 ENCOUNTER — Other Ambulatory Visit: Payer: Self-pay

## 2024-08-16 DIAGNOSIS — M6281 Muscle weakness (generalized): Secondary | ICD-10-CM

## 2024-08-16 DIAGNOSIS — M25521 Pain in right elbow: Secondary | ICD-10-CM | POA: Diagnosis not present

## 2024-08-16 NOTE — Therapy (Signed)
 OUTPATIENT PHYSICAL THERAPY TREATMENT   Patient Name: Jonathan Young MRN: [id number] DOB:1951/09/11, 73 y.o., male Today's Date: 08/16/2024   END OF SESSION:  PT End of Session - 08/16/24 1150     Visit Number 3    Number of Visits 9    Date for PT Re-Evaluation 09/24/24    Authorization Type [employer]    Progress Note Due on Visit 10    PT Start Time 0930    PT Stop Time 1012    PT Time Calculation (min) 42 min    Activity Tolerance Patient tolerated treatment well    Behavior During Therapy WFL for tasks assessed/performed            Past Medical History:  Diagnosis Date   Complication of anesthesia    sore throat from tube   COPD (chronic obstructive pulmonary disease) (HCC)    Emphysema   Dysrhythmia    PVC'S   GERD (gastroesophageal reflux disease)    Hypercholesteremia    Hypertension    Neuromuscular disorder (HCC) 2005   had right foot-drop, no etiology found, resolved after steroids   Osteoarthritis    Umbilical hernia    Vertigo    Past Surgical History:  Procedure Laterality Date   ANTERIOR CERVICAL DECOMP/DISCECTOMY FUSION  2018   C5-6 and C6-7   done by Dr. Mora LANES LIFT AND BLEPHAROPLASTY Bilateral 2025   CARPAL TUNNEL RELEASE Left 05/23/2018   CARPAL TUNNEL RELEASE Right 11/22/2018   CERVICAL SPINE SURGERY  2019   ACDF   HEMORRHOID SURGERY  1982   HERNIA REPAIR  2021   umbilical hernia wuth mesh   INSERTION OF MESH N/A 07/27/2020   Procedure: INSERTION OF MESH;  Surgeon: Vanderbilt Ned, MD;  Location: [facility];  Service: General;  Laterality: N/A;   SHOULDER SURGERY     BIL   TONSILLECTOMY AND ADENOIDECTOMY     TOTAL HIP ARTHROPLASTY     RT TOAL HIP   TOTAL HIP ARTHROPLASTY  06/01/2012   Procedure: TOTAL HIP ARTHROPLASTY ANTERIOR APPROACH;  Surgeon: Donnice JONETTA Car, MD;  Location: [facility];  Service: Orthopedics;  Laterality: Left;   TRANSURETHRAL RESECTION OF PROSTATE N/A 02/27/2024   Procedure: TURP  (TRANSURETHRAL RESECTION OF PROSTATE);  Surgeon: Nieves Donnice, MD;  Location: [facility];  Service: Urology;  Laterality: N/A;  60 MINUTE CASE   UMBILICAL HERNIA REPAIR N/A 07/27/2020   Procedure: UMBILICAL HERNIA REPAIR WITH MESH;  Surgeon: Vanderbilt Ned, MD;  Location: [facility];  Service: General;  Laterality: N/A;   WISDOM TOOTH EXTRACTION     Patient Active Problem List   Diagnosis Date Noted   Right shoulder pain 05/18/2024   Right lateral epicondylitis 03/16/2024   Centrilobular emphysema (HCC) 03/15/2024   Encounter for general adult medical examination with abnormal findings 05/09/2023   LPRD (laryngopharyngeal reflux disease) 05/08/2023   Patellofemoral arthritis of right knee 08/19/2022   Psychophysiological insomnia 06/06/2020   Maxillary sinusitis, chronic 06/29/2017   CRI (chronic renal insufficiency), stage 3 (moderate) (HCC) 04/07/2017   Pure hyperglyceridemia 04/03/2017   Essential hypertension 11/07/2016   GAD (generalized anxiety disorder) 10/17/2015   Erectile dysfunction of organic origin 10/10/2015   PSA elevation 01/10/2015   GERD (gastroesophageal reflux disease) 04/23/2011   IBS (irritable bowel syndrome) 04/23/2011   Vitamin D  deficiency 01/16/2011   DUPUYTREN'S CONTRACTURE 01/16/2011   Hypogonadism male 07/25/2010   IDIOPATHIC OSTEOPOROSIS 11/01/2008   Vitamin B 12 deficiency 03/22/2008  Osteoarthritis 08/25/2007   Hyperlipidemia with target LDL less than 130 08/03/2007    PCP: Joshua Debby CROME, MD  REFERRING PROVIDER: Claudene Arthea HERO, DO  REFERRING DIAG: Right elbow pain  THERAPY DIAG:  Pain in right elbow  Muscle weakness (generalized)  Rationale for Evaluation and Treatment: Rehabilitation  ONSET DATE: Chronic, approximately 2 months ago   SUBJECTIVE:         SUBJECTIVE STATEMENT: Patient reports he feels like the dry needling helped. The pain isn't always there but still feels most of the pain in the morning.    Eval: Patient reports right elbow pain. He is wearing a wrist brace to avoid wrist extension because that hurts and lifting will also cause pain. This first started about 2 months ago when he was lifting clothes. He did have an injection and used the wrist brace in the past but it seemed to get better, but then lifting at the gym may have aggravated it again. He does always have soreness in the morning and gets sharp pains with activities that aggravate the elbow. He is using the the wrist brace constantly.   Hand dominance: Left  PERTINENT HISTORY: See PMH above  PAIN:  Are you having pain? Yes:  NPRS scale: 0/10 at rest, 4/10 pain in the morning Pain location: Right elbow Pain description: Sore, Sharp Aggravating factors: Wrist extension Relieving factors: Rest, wrist brace  PRECAUTIONS: None  PATIENT GOALS: Pain relief   OBJECTIVE:  Note: Objective measures were completed at Evaluation unless otherwise noted. PATIENT SURVEYS:  QuickDASH: 47.7% disability  POSTURE: Mild rounded shoulder and forward head posture  UPPER EXTREMITY ROM:   Shoulder, elbow, wrist AROM grossly WFL and equal bilaterally  UPPER EXTREMITY MMT:  MMT Right eval Left eval  Shoulder flexion 4+ 5  Shoulder extension 5 5  Shoulder abduction 5 5  Shoulder adduction    Shoulder internal rotation 5 5  Shoulder external rotation 4+ 5  Middle trapezius    Lower trapezius    Elbow flexion 5 5  Elbow extension 5 5  Wrist flexion  5  Wrist extension 4+ 5  Wrist ulnar deviation 5 5  Wrist radial deviation 5 5  Wrist pronation 5 5  Wrist supination 5 5  Grip strength (lbs) 58 83  (Blank rows = not tested)  Wrist strength assessment performed with elbow bent  JOINT MOBILITY TESTING:  Grossly WFL  Patient reported no change in lateral elbow symptoms with lateral glide combined with gripping  PALPATION:  Tender to palpation at wrist extensor group insertion at lateral epicondyle, wrists  extensor muscle group tenderness                                                                                                                             TREATMENT [facility] Adult PT Treatment:  DATE: 08/16/2024 Right elbow joint mobs  Right elbow PROM Standing shoulder ER with green 2 x 10 Standing farmer's carry with 50# 2 x 30 sec, 40# 2 x 30 sec Seated wrist extension with 4# elbow bent 2 x 10 Seated shoulder flexion in pronation with 4# 5 x 15 sec   PATIENT EDUCATION: Education details: HEP update Person educated: Patient Education method: Explanation, Demonstration, Tactile cues, Verbal cues Education comprehension: verbalized understanding, returned demonstration, verbal cues required, tactile cues required, and needs further education  HOME EXERCISE PROGRAM: Access Code: [email or web address]   ASSESSMENT: CLINICAL IMPRESSION: Patient tolerated therapy well with no adverse effects. Therapy focused on manual for elbow mobility and pain relief, and progressing strengthening and load tolerance for the right elbow. Incorporated some rotator cuff strengthening for the right shoulder with good tolerance in encouraged patient to reintroduce rotator cuff strengthening at the gym. Worked on grip strengthening with farmer's carry with good tolerance and updated his HEP to incorporate that exercise at the gym. Patient would benefit from continued skilled PT to progress mobility and strength in order to reduce pain and maximize functional ability.   Eval: Patient is a 73 y.o. male who was seen today for physical therapy evaluation and treatment for chronic right elbow pain that seems consistent with lateral epicondylagia. Majority of his symptoms occur when he is perform wrist movements while the elbow is extended. He demonstrates good strength with minimal pain with wrist strength testing with his elbow bent, but increased lateral elbow pain and weakness due to  pain when his elbow is extended. He does have some grip strength deficits on the right with pain and tenderness about the wrist extensor muscle mass. Overall his range of motion is good of the shoulder, elbow, and wrist but some slight strength deficit of the right shoulder.  OBJECTIVE IMPAIRMENTS: decreased activity tolerance, decreased strength, and pain.   ACTIVITY LIMITATIONS: carrying, lifting, and hygiene/grooming  PARTICIPATION LIMITATIONS: meal prep, cleaning, laundry, shopping, and community activity  PERSONAL FACTORS: Fitness, Past/current experiences, and Time since onset of injury/illness/exacerbation are also affecting patient's functional outcome.    GOALS: Goals reviewed with patient? Yes  SHORT TERM GOALS: Target date: 08/27/2024  Patient will be I with initial HEP in order to progress with therapy. Baseline: HEP provided at eval Goal status: INITIAL  2.  Patient will report right elbow pain </= 2/10 with lifting while arm straight in order to reduce functional limitations Baseline: 5-6/10 pain Goal status: INITIAL  LONG TERM GOALS: Target date: 09/24/2024  Patient will be I with final HEP to maintain progress from PT. Baseline: HEP provided at eval Goal status: INITIAL  2.  Patient will report QuickDASH </= 20% in order to indicate an improvement in their functional status Baseline: 47.7 Goal status: INITIAL  3.  Patient will demonstrate right shoulder and wrist strength 5/5 MMT in order to improve activity tolerance and reduce pain with lifting Baseline: see limitations above Goal status: INITIAL  4.  Patient will be able to lift lighter objects around home with elbow straight without pain or limitation in order to reduce functional limitations Baseline: patient reports pain and limitation lifting objects like his keys Goal status: INITIAL   PLAN: PT FREQUENCY: 1-2x/week  PT DURATION: 8 weeks  PLANNED INTERVENTIONS: 97164- PT Re-evaluation, 97750-  Physical Performance Testing, 97110-Therapeutic exercises, 97530- Therapeutic activity, [id number]- Neuromuscular re-education, 97535- Self Care, 02859- Manual therapy, 97033- Ionotophoresis 4mg /ml Dexamethasone , 79439 (1-2 muscles), 20561 (3+ muscles)- Dry Needling, Patient/Family education, Taping, Joint  mobilization, Joint manipulation, Cryotherapy, and Moist heat  PLAN FOR NEXT SESSION: Review HEP and progress PRN, manual/TPDN for right wrist extensor muscle group, trial elbow mobs with gripping/wrist extension, wrist/elbow/shoulder strengthening/loading with progression to straight elbow for activity tolerance, Ionto PRN   Elaine Daring, PT, DPT, LAT, ATC 08/16/24  12:00 PM Phone: [phone] Fax: [phone]

## 2024-08-16 NOTE — Patient Instructions (Signed)
 Access Code: [email or web address] URL: [email or web address] Date: 08/16/2024 Prepared by: Elaine Daring  Exercises - Seated Wrist Extension with Dumbbell  - 1 x daily - 3 sets - 10 reps - Forearm Pronation and Supination with Hammer  - 1 x daily - 3 sets - 10 reps - Standing Shoulder Flexion with Dumbbells  - 1 x daily - 3 sets - 5 reps - 15 seconds hold - Farmer's Carry with Kettlebells  - 1 x daily - 5 sets - 30 seconds hold

## 2024-08-24 ENCOUNTER — Encounter: Admitting: Physical Therapy

## 2024-08-30 ENCOUNTER — Encounter: Payer: Self-pay | Admitting: Physical Therapy

## 2024-08-30 ENCOUNTER — Other Ambulatory Visit: Payer: Self-pay

## 2024-08-30 ENCOUNTER — Ambulatory Visit: Admitting: Physical Therapy

## 2024-08-30 DIAGNOSIS — R3912 Poor urinary stream: Secondary | ICD-10-CM | POA: Diagnosis not present

## 2024-08-30 DIAGNOSIS — M25521 Pain in right elbow: Secondary | ICD-10-CM | POA: Diagnosis not present

## 2024-08-30 DIAGNOSIS — M6281 Muscle weakness (generalized): Secondary | ICD-10-CM | POA: Diagnosis not present

## 2024-08-30 DIAGNOSIS — N401 Enlarged prostate with lower urinary tract symptoms: Secondary | ICD-10-CM | POA: Diagnosis not present

## 2024-08-30 DIAGNOSIS — R972 Elevated prostate specific antigen [PSA]: Secondary | ICD-10-CM | POA: Diagnosis not present

## 2024-08-30 NOTE — Therapy (Signed)
 OUTPATIENT PHYSICAL THERAPY TREATMENT   Patient Name: Jonathan Young MRN: [id number] DOB:25-Feb-1951, 73 y.o., male Today's Date: 08/30/2024   END OF SESSION:  PT End of Session - 08/30/24 1020     Visit Number 4    Number of Visits 9    Date for PT Re-Evaluation 09/24/24    Authorization Type [employer]    Progress Note Due on Visit 10    PT Start Time 1015    PT Stop Time 1100    PT Time Calculation (min) 45 min    Activity Tolerance Patient tolerated treatment well    Behavior During Therapy WFL for tasks assessed/performed             Past Medical History:  Diagnosis Date   Complication of anesthesia    sore throat from tube   COPD (chronic obstructive pulmonary disease) (HCC)    Emphysema   Dysrhythmia    PVC'S   GERD (gastroesophageal reflux disease)    Hypercholesteremia    Hypertension    Neuromuscular disorder (HCC) 2005   had right foot-drop, no etiology found, resolved after steroids   Osteoarthritis    Umbilical hernia    Vertigo    Past Surgical History:  Procedure Laterality Date   ANTERIOR CERVICAL DECOMP/DISCECTOMY FUSION  2018   C5-6 and C6-7   done by Dr. Mora LANES LIFT AND BLEPHAROPLASTY Bilateral 2025   CARPAL TUNNEL RELEASE Left 05/23/2018   CARPAL TUNNEL RELEASE Right 11/22/2018   CERVICAL SPINE SURGERY  2019   ACDF   HEMORRHOID SURGERY  1982   HERNIA REPAIR  2021   umbilical hernia wuth mesh   INSERTION OF MESH N/A 07/27/2020   Procedure: INSERTION OF MESH;  Surgeon: Vanderbilt Ned, MD;  Location: [facility];  Service: General;  Laterality: N/A;   SHOULDER SURGERY     BIL   TONSILLECTOMY AND ADENOIDECTOMY     TOTAL HIP ARTHROPLASTY     RT TOAL HIP   TOTAL HIP ARTHROPLASTY  06/01/2012   Procedure: TOTAL HIP ARTHROPLASTY ANTERIOR APPROACH;  Surgeon: Donnice JONETTA Car, MD;  Location: [facility];  Service: Orthopedics;  Laterality: Left;   TRANSURETHRAL RESECTION OF PROSTATE N/A 02/27/2024   Procedure: TURP  (TRANSURETHRAL RESECTION OF PROSTATE);  Surgeon: Nieves Donnice, MD;  Location: [facility];  Service: Urology;  Laterality: N/A;  60 MINUTE CASE   UMBILICAL HERNIA REPAIR N/A 07/27/2020   Procedure: UMBILICAL HERNIA REPAIR WITH MESH;  Surgeon: Vanderbilt Ned, MD;  Location: [facility];  Service: General;  Laterality: N/A;   WISDOM TOOTH EXTRACTION     Patient Active Problem List   Diagnosis Date Noted   Right shoulder pain 05/18/2024   Right lateral epicondylitis 03/16/2024   Centrilobular emphysema (HCC) 03/15/2024   Encounter for general adult medical examination with abnormal findings 05/09/2023   LPRD (laryngopharyngeal reflux disease) 05/08/2023   Patellofemoral arthritis of right knee 08/19/2022   Psychophysiological insomnia 06/06/2020   Maxillary sinusitis, chronic 06/29/2017   CRI (chronic renal insufficiency), stage 3 (moderate) (HCC) 04/07/2017   Pure hyperglyceridemia 04/03/2017   Essential hypertension 11/07/2016   GAD (generalized anxiety disorder) 10/17/2015   Erectile dysfunction of organic origin 10/10/2015   PSA elevation 01/10/2015   GERD (gastroesophageal reflux disease) 04/23/2011   IBS (irritable bowel syndrome) 04/23/2011   Vitamin D  deficiency 01/16/2011   DUPUYTREN'S CONTRACTURE 01/16/2011   Hypogonadism male 07/25/2010   IDIOPATHIC OSTEOPOROSIS 11/01/2008   Vitamin B 12 deficiency 03/22/2008  Osteoarthritis 08/25/2007   Hyperlipidemia with target LDL less than 130 08/03/2007    PCP: Joshua Debby CROME, MD  REFERRING PROVIDER: Claudene Arthea HERO, DO  REFERRING DIAG: Right elbow pain  THERAPY DIAG:  Pain in right elbow  Muscle weakness (generalized)  Rationale for Evaluation and Treatment: Rehabilitation  ONSET DATE: Chronic, approximately 2 months ago   SUBJECTIVE:         SUBJECTIVE STATEMENT: Patient reports the elbow is good, still sore at times when he wakes up in the morning. States it is not back to normal but still  improving.   Eval: Patient reports right elbow pain. He is wearing a wrist brace to avoid wrist extension because that hurts and lifting will also cause pain. This first started about 2 months ago when he was lifting clothes. He did have an injection and used the wrist brace in the past but it seemed to get better, but then lifting at the gym may have aggravated it again. He does always have soreness in the morning and gets sharp pains with activities that aggravate the elbow. He is using the the wrist brace constantly.   Hand dominance: Left  PERTINENT HISTORY: See PMH above  PAIN:  Are you having pain? Yes:  NPRS scale: 0/10 at rest, 4/10 pain in the morning Pain location: Right elbow Pain description: Sore, Sharp Aggravating factors: Wrist extension Relieving factors: Rest, wrist brace  PRECAUTIONS: None  PATIENT GOALS: Pain relief   OBJECTIVE:  Note: Objective measures were completed at Evaluation unless otherwise noted. PATIENT SURVEYS:  QuickDASH: 47.7% disability  POSTURE: Mild rounded shoulder and forward head posture  UPPER EXTREMITY ROM:   Shoulder, elbow, wrist AROM grossly WFL and equal bilaterally  UPPER EXTREMITY MMT:  MMT Right eval Left eval  Shoulder flexion 4+ 5  Shoulder extension 5 5  Shoulder abduction 5 5  Shoulder adduction    Shoulder internal rotation 5 5  Shoulder external rotation 4+ 5  Middle trapezius    Lower trapezius    Elbow flexion 5 5  Elbow extension 5 5  Wrist flexion  5  Wrist extension 4+ 5  Wrist ulnar deviation 5 5  Wrist radial deviation 5 5  Wrist pronation 5 5  Wrist supination 5 5  Grip strength (lbs) 58 83  (Blank rows = not tested)  Wrist strength assessment performed with elbow bent  JOINT MOBILITY TESTING:  Grossly WFL  Patient reported no change in lateral elbow symptoms with lateral glide combined with gripping  PALPATION:  Tender to palpation at wrist extensor group insertion at lateral epicondyle,  wrists extensor muscle group tenderness                                                                                                                             TREATMENT [facility] Adult PT Treatment:  DATE: 08/30/2024 UBE L3 x 4 min (fwd/bwd) to improve endurance and workload capacity STM / TPR for right wrist extensor muscle group Seated wrists extension isometrics with elbow straight using 5# 5 x 45 sec Supine wrist extension with elbow straight using 3# 3 x 10 Sidelying shoulder ER with 3# 3 x 15 Standing MWM using black band for lateral radioulnar glide in combination with gripping Standing shoulder scaption with palm down using 3# 2 x 10  Trigger Point Dry Needling  Subsequent Treatment: Instructions provided previously at initial dry needling treatment.  Instructions reviewed, if requested by the patient, prior to subsequent dry needling treatment.   Patient Verbal Consent Given: Yes Education Handout Provided: No patient declined Muscles Treated: Right wrist extensor muscle mass Electrical Stimulation Performed: No Treatment Response/Outcome: Twitch response   PATIENT EDUCATION: Education details: TPDN, HEP Person educated: Patient Education method: Explanation, Demonstration, Tactile cues, Verbal cues Education comprehension: verbalized understanding, returned demonstration, verbal cues required, tactile cues required, and needs further education  HOME EXERCISE PROGRAM: Access Code: [email or web address]   ASSESSMENT: CLINICAL IMPRESSION: Patient tolerated therapy well with no adverse effects. Continued with TPDN for the right wrist extensor muscle group just distal to the lateral epicondyle insertion with multiple twitch responses and good therapeutic benefit. Therapy continued to progress loading for the elbow musculature with good tolerance. He does report 3-4/10 pain with his elbow exercises, but does seem to be progressing with his  activity tolerance and reports improvement with his pain. Updated his HEP to perform wrist extension exercise with the elbow straight, and continue progressing resistance and/or reps for previous HEP or gym exercises. Patient would benefit from continued skilled PT to progress mobility and strength in order to reduce pain and maximize functional ability.   Eval: Patient is a 73 y.o. male who was seen today for physical therapy evaluation and treatment for chronic right elbow pain that seems consistent with lateral epicondylagia. Majority of his symptoms occur when he is perform wrist movements while the elbow is extended. He demonstrates good strength with minimal pain with wrist strength testing with his elbow bent, but increased lateral elbow pain and weakness due to pain when his elbow is extended. He does have some grip strength deficits on the right with pain and tenderness about the wrist extensor muscle mass. Overall his range of motion is good of the shoulder, elbow, and wrist but some slight strength deficit of the right shoulder.  OBJECTIVE IMPAIRMENTS: decreased activity tolerance, decreased strength, and pain.   ACTIVITY LIMITATIONS: carrying, lifting, and hygiene/grooming  PARTICIPATION LIMITATIONS: meal prep, cleaning, laundry, shopping, and community activity  PERSONAL FACTORS: Fitness, Past/current experiences, and Time since onset of injury/illness/exacerbation are also affecting patient's functional outcome.    GOALS: Goals reviewed with patient? Yes  SHORT TERM GOALS: Target date: 08/27/2024  Patient will be I with initial HEP in order to progress with therapy. Baseline: HEP provided at eval 08/30/2024: independent Goal status: MET  2.  Patient will report right elbow pain </= 2/10 with lifting while arm straight in order to reduce functional limitations Baseline: 5-6/10 pain 08/30/2024: 3-4/10 pain Goal status: ONGOING  LONG TERM GOALS: Target date: 09/24/2024  Patient  will be I with final HEP to maintain progress from PT. Baseline: HEP provided at eval Goal status: INITIAL  2.  Patient will report QuickDASH </= 20% in order to indicate an improvement in their functional status Baseline: 47.7 Goal status: INITIAL  3.  Patient will demonstrate right shoulder and wrist strength  5/5 MMT in order to improve activity tolerance and reduce pain with lifting Baseline: see limitations above Goal status: INITIAL  4.  Patient will be able to lift lighter objects around home with elbow straight without pain or limitation in order to reduce functional limitations Baseline: patient reports pain and limitation lifting objects like his keys Goal status: INITIAL   PLAN: PT FREQUENCY: 1-2x/week  PT DURATION: 8 weeks  PLANNED INTERVENTIONS: 97164- PT Re-evaluation, 97750- Physical Performance Testing, 97110-Therapeutic exercises, 97530- Therapeutic activity, 97112- Neuromuscular re-education, 97535- Self Care, 02859- Manual therapy, [phone]- Ionotophoresis 4mg /ml Dexamethasone , 79439 (1-2 muscles), 20561 (3+ muscles)- Dry Needling, Patient/Family education, Taping, Joint mobilization, Joint manipulation, Cryotherapy, and Moist heat  PLAN FOR NEXT SESSION: Review HEP and progress PRN, manual/TPDN for right wrist extensor muscle group, trial elbow mobs with gripping/wrist extension, wrist/elbow/shoulder strengthening/loading with progression to straight elbow for activity tolerance, Ionto PRN   Elaine Daring, PT, DPT, LAT, ATC 08/30/24  11:27 AM Phone: [phone] Fax: [phone]

## 2024-09-01 NOTE — Progress Notes (Unsigned)
 [facility] [address] [place] [address] Phone: [phone] Subjective:   Jonathan Young am a scribe for Dr. Claudene.   I'm seeing this patient by the request  of:  Joshua Debby CROME, MD  CC: Right shoulder and right elbow pain follow  YEP:Dlagzrupcz  07/19/2024 Patient has had now what appears to be more of a partial tear noted. Discussed with patient about the possibility of PRP. I went back to the bracing at the moment. Discussed icing regimen. Worsening pain advanced imaging could be warranted as well. Will also start formal physical therapy. Will follow-up again in 6 to 8 weeks   Update 09/02/2024 Jonathan Young is a 73 y.o. male coming in with complaint of R elbow pain. Patient states that the elbow is better but it isn't 100%. It doesn't really hurt until he lifts things. In the morning it is a little stiff and some pain. Still doing physical therapy with Dr. Elaine.       Past Medical History:  Diagnosis Date   Complication of anesthesia    sore throat from tube   COPD (chronic obstructive pulmonary disease) (HCC)    Emphysema   Dysrhythmia    PVC'S   GERD (gastroesophageal reflux disease)    Hypercholesteremia    Hypertension    Neuromuscular disorder (HCC) 2005   had right foot-drop, no etiology found, resolved after steroids   Osteoarthritis    Umbilical hernia    Vertigo    Past Surgical History:  Procedure Laterality Date   ANTERIOR CERVICAL DECOMP/DISCECTOMY FUSION  2018   C5-6 and C6-7   done by Dr. Mora LANES LIFT AND BLEPHAROPLASTY Bilateral 2025   CARPAL TUNNEL RELEASE Left 05/23/2018   CARPAL TUNNEL RELEASE Right 11/22/2018   CERVICAL SPINE SURGERY  2019   ACDF   HEMORRHOID SURGERY  1982   HERNIA REPAIR  2021   umbilical hernia wuth mesh   INSERTION OF MESH N/A 07/27/2020   Procedure: INSERTION OF MESH;  Surgeon: Vanderbilt Debby, MD;  Location: [facility];  Service: General;  Laterality:  N/A;   SHOULDER SURGERY     BIL   TONSILLECTOMY AND ADENOIDECTOMY     TOTAL HIP ARTHROPLASTY     RT TOAL HIP   TOTAL HIP ARTHROPLASTY  06/01/2012   Procedure: TOTAL HIP ARTHROPLASTY ANTERIOR APPROACH;  Surgeon: Donnice JONETTA Car, MD;  Location: [facility];  Service: Orthopedics;  Laterality: Left;   TRANSURETHRAL RESECTION OF PROSTATE N/A 02/27/2024   Procedure: TURP (TRANSURETHRAL RESECTION OF PROSTATE);  Surgeon: Nieves Donnice, MD;  Location: [facility];  Service: Urology;  Laterality: N/A;  60 MINUTE CASE   UMBILICAL HERNIA REPAIR N/A 07/27/2020   Procedure: UMBILICAL HERNIA REPAIR WITH MESH;  Surgeon: Vanderbilt Debby, MD;  Location: [facility];  Service: General;  Laterality: N/A;   WISDOM TOOTH EXTRACTION     Social History   Socioeconomic History   Marital status: Single    Spouse name: Not on file   Number of children: Not on file   Years of education: Not on file   Highest education level: Bachelor's degree (e.g., BA, AB, BS)  Occupational History   Not on file  Tobacco Use   Smoking status: Former    Current packs/day: 0.00    Types: Cigarettes    Quit date: 05/27/1981    Years since quitting: 43.2    Passive exposure: Never   Smokeless tobacco: Never  Vaping Use   Vaping status: Never Used  Substance and Sexual Activity   Alcohol use: Yes    Alcohol/week: 20.0 standard drinks of alcohol    Types: 20 Shots of liquor per week    Comment: wine nightly   Drug use: No   Sexual activity: Yes    Partners: Male  Other Topics Concern   Not on file  Social History Narrative   Not on file   Social Drivers of Health   Financial Resource Strain: Low Risk  (12/21/2023)   Overall Financial Resource Strain (CARDIA)    Difficulty of Paying Living Expenses: Not hard at all  Food Insecurity: No Food Insecurity (12/21/2023)   Hunger Vital Sign    Worried About Running Out of Food in the Last Year: Never true    Ran Out of Food in the Last Year: Never true   Transportation Needs: No Transportation Needs (12/21/2023)   PRAPARE - Administrator, Civil Service (Medical): No    Lack of Transportation (Non-Medical): No  Physical Activity: Insufficiently Active (12/21/2023)   Exercise Vital Sign    Days of Exercise per Week: 3 days    Minutes of Exercise per Session: 40 min  Stress: Stress Concern Present (12/21/2023)   [employer] of Occupational Health - Occupational Stress Questionnaire    Feeling of Stress : Rather much  Social Connections: Moderately Isolated (12/21/2023)   Social Connection and Isolation Panel    Frequency of Communication with Friends and Family: More than three times a week    Frequency of Social Gatherings with Friends and Family: More than three times a week    Attends Religious Services: Never    Database administrator or Organizations: No    Attends Engineer, structural: Never    Marital Status: Living with partner   Allergies  Allergen Reactions   Vytorin [Ezetimibe-Simvastatin] Other (See Comments)    Muscle aches   Family History  Problem Relation Age of Onset   Emphysema Mother    Colon cancer Neg Hx    Esophageal cancer Neg Hx    Rectal cancer Neg Hx    Stomach cancer Neg Hx      Current Outpatient Medications (Cardiovascular):    rosuvastatin  (CRESTOR ) 10 MG tablet, TAKE 1 TABLET(10 MG) BY MOUTH DAILY   tadalafil  (CIALIS ) 20 MG tablet, Take 1 tablet (20 mg total) by mouth See admin instructions. Take 20 mg by mouth one hour prior to intercourse- maximum of 20 mg (1 tablet) in 24 hours  Current Outpatient Medications (Respiratory):    tiotropium (SPIRIVA ) 18 MCG inhalation capsule, Place 1 capsule (18 mcg total) into inhaler and inhale daily.    Current Outpatient Medications (Other):    calcium  carbonate (OS-CAL - DOSED IN MG OF ELEMENTAL CALCIUM ) 1250 (500 Ca) MG tablet, Take 1 tablet by mouth daily with breakfast.   Cholecalciferol  50 MCG (2000 UT) TABS, Take 1  tablet (2,000 Units total) by mouth daily.   MAGNESIUM GLYCINATE PO, Take 240 mg by mouth at bedtime.   Multiple Vitamin (MULTIVITAMIN WITH MINERALS) TABS tablet, Take 1 tablet by mouth daily. Centrum Silver   pantoprazole  (PROTONIX ) 40 MG tablet, TAKE 1 TABLET(40 MG) BY MOUTH DAILY   Suvorexant  (BELSOMRA ) 15 MG TABS, Take 1 tablet by mouth at bedtime as needed.   valACYclovir  (VALTREX ) 1000 MG tablet, Take 0.5 tablets (500 mg total) by mouth daily.   amoxicillin  (AMOXIL ) 500 MG capsule, Take 2,000 mg by mouth  See admin instructions. Take 4 capsules (2000 mg) by mouth 1 hour prior to dental appointments. (Patient not taking: Reported on 09/02/2024)   Reviewed prior external information including notes and imaging from  primary care provider As well as notes that were available from care everywhere and other healthcare systems.  Past medical history, social, surgical and family history all reviewed in electronic medical record.  No pertanent information unless stated regarding to the chief complaint.   Review of Systems:  No headache, visual changes, nausea, vomiting, diarrhea, constipation, dizziness, abdominal pain, skin rash, fevers, chills, night sweats, weight loss, swollen lymph nodes, body aches, joint swelling, chest pain, shortness of breath, mood changes. POSITIVE muscle aches  Objective  Blood pressure 102/60, pulse 81, height 6' 1 (1.854 m), weight 188 lb (85.3 kg), SpO2 95%.   General: No apparent distress alert and oriented x3 mood and affect normal, dressed appropriately.  HEENT: Pupils equal, extraocular movements intact  Respiratory: Patient's speak in full sentences and does not appear short of breath  Cardiovascular: No lower extremity edema, non tender, no erythema  Right elbow exam shows the patient does have some tenderness to palpation more over the lateral epicondylar area.  Patient also on the right shoulder does have positive impingement noted.  Does appear to be  intact.  Limited muscular skeletal ultrasound was performed and interpreted by CLAUDENE HUSSAR, M  Limited ultrasound of patient's common extensor tendon shows no significant finding at the moment.  Patient does have some scar tissue formation noted..  Seems to have interval healing from previous exam. Impression: Interval improvement noted.  Procedure: Real-time Ultrasound Guided Injection of right subacromial space Device: GE Logiq Q7  Ultrasound guided injection is preferred based studies that show increased duration, increased effect, greater accuracy, decreased procedural pain, increased response rate with ultrasound guided versus blind injection.  Verbal informed consent obtained.  Time-out conducted.  Noted no overlying erythema, induration, or other signs of local infection.  Skin prepped in a sterile fashion.  Local anesthesia: Topical Ethyl chloride.  With sterile technique and under real time ultrasound guidance:  Joint visualized.  23g 1  inch needle inserted lateral approach. Pictures taken for needle placement. Patient did have injection of , 2 cc of 0.5% Marcaine , and 1.0 cc of Kenalog 40 mg/dL. Completed without difficulty  Pain immediately resolved suggesting accurate placement of the medication.  Advised to call if fevers/chills, erythema, induration, drainage, or persistent bleeding.  Images saved Impression: Technically successful ultrasound guided injection.    Impression and Recommendations:    The above documentation has been reviewed and is accurate and complete Chue Berkovich M Jessamy Torosyan, DO

## 2024-09-02 ENCOUNTER — Ambulatory Visit: Admitting: Family Medicine

## 2024-09-02 ENCOUNTER — Other Ambulatory Visit: Payer: Self-pay

## 2024-09-02 ENCOUNTER — Encounter: Payer: Self-pay | Admitting: Family Medicine

## 2024-09-02 VITALS — BP 102/60 | HR 81 | Ht 73.0 in | Wt 188.0 lb

## 2024-09-02 DIAGNOSIS — M25521 Pain in right elbow: Secondary | ICD-10-CM

## 2024-09-02 DIAGNOSIS — G8929 Other chronic pain: Secondary | ICD-10-CM | POA: Diagnosis not present

## 2024-09-02 DIAGNOSIS — M7711 Lateral epicondylitis, right elbow: Secondary | ICD-10-CM

## 2024-09-02 DIAGNOSIS — M25511 Pain in right shoulder: Secondary | ICD-10-CM | POA: Diagnosis not present

## 2024-09-02 NOTE — Assessment & Plan Note (Signed)
 Improvement noted, discussed icing regimen and home exercises, discussed which activities to do and which ones to avoid.  Increase activity slowly.  Discussed icing regimen.  Still avoid overhead lifting.  Will follow-up with me again in 3 months

## 2024-09-02 NOTE — Assessment & Plan Note (Signed)
 Chronic problem, has had 2 surgeries on the shoulder previously.  Some degenerative changes but no weakness.  Given injection today and hopeful that this will make significant improvement.  Warned of potential side effects.  The patient is going to take it easy for the first 48 hours and did recently move that probably did cause an exacerbation.  Follow-up with me again in 3 months

## 2024-09-02 NOTE — Patient Instructions (Addendum)
 Good to see you. Elbow looking much better. Ok to stop PT and do HEP. Keep hands in Periferal vision. No overhead lifting.  See me again 3 months.

## 2024-09-07 ENCOUNTER — Ambulatory Visit: Admitting: Physical Therapy

## 2024-09-07 ENCOUNTER — Encounter: Payer: Self-pay | Admitting: Physical Therapy

## 2024-09-07 ENCOUNTER — Other Ambulatory Visit: Payer: Self-pay

## 2024-09-07 DIAGNOSIS — M6281 Muscle weakness (generalized): Secondary | ICD-10-CM | POA: Diagnosis not present

## 2024-09-07 DIAGNOSIS — L905 Scar conditions and fibrosis of skin: Secondary | ICD-10-CM | POA: Diagnosis not present

## 2024-09-07 DIAGNOSIS — C44722 Squamous cell carcinoma of skin of right lower limb, including hip: Secondary | ICD-10-CM | POA: Diagnosis not present

## 2024-09-07 DIAGNOSIS — M25521 Pain in right elbow: Secondary | ICD-10-CM | POA: Diagnosis not present

## 2024-09-07 NOTE — Therapy (Addendum)
 " OUTPATIENT PHYSICAL THERAPY TREATMENT  DISCHARGE   Patient Name: Jonathan Young MRN: [id number] DOB:Apr 06, 1951, 73 y.o., male Today's Date: 09/07/2024   END OF SESSION:  PT End of Session - 09/07/24 1109     Visit Number 5    Number of Visits 9    Date for PT Re-Evaluation 09/24/24    Authorization Type [employer]    Progress Note Due on Visit 10    PT Start Time 1102    PT Stop Time 1143    PT Time Calculation (min) 41 min    Activity Tolerance Patient tolerated treatment well    Behavior During Therapy WFL for tasks assessed/performed              Past Medical History:  Diagnosis Date   Complication of anesthesia    sore throat from tube   COPD (chronic obstructive pulmonary disease) (HCC)    Emphysema   Dysrhythmia    PVC'S   GERD (gastroesophageal reflux disease)    Hypercholesteremia    Hypertension    Neuromuscular disorder (HCC) 2005   had right foot-drop, no etiology found, resolved after steroids   Osteoarthritis    Umbilical hernia    Vertigo    Past Surgical History:  Procedure Laterality Date   ANTERIOR CERVICAL DECOMP/DISCECTOMY FUSION  2018   C5-6 and C6-7   done by Dr. Mora LANES LIFT AND BLEPHAROPLASTY Bilateral 2025   CARPAL TUNNEL RELEASE Left 05/23/2018   CARPAL TUNNEL RELEASE Right 11/22/2018   CERVICAL SPINE SURGERY  2019   ACDF   HEMORRHOID SURGERY  1982   HERNIA REPAIR  2021   umbilical hernia wuth mesh   INSERTION OF MESH N/A 07/27/2020   Procedure: INSERTION OF MESH;  Surgeon: Vanderbilt Ned, MD;  Location: [facility];  Service: General;  Laterality: N/A;   SHOULDER SURGERY     BIL   TONSILLECTOMY AND ADENOIDECTOMY     TOTAL HIP ARTHROPLASTY     RT TOAL HIP   TOTAL HIP ARTHROPLASTY  06/01/2012   Procedure: TOTAL HIP ARTHROPLASTY ANTERIOR APPROACH;  Surgeon: Donnice JONETTA Car, MD;  Location: [facility];  Service: Orthopedics;  Laterality: Left;   TRANSURETHRAL RESECTION OF PROSTATE N/A 02/27/2024    Procedure: TURP (TRANSURETHRAL RESECTION OF PROSTATE);  Surgeon: Nieves Donnice, MD;  Location: [facility];  Service: Urology;  Laterality: N/A;  60 MINUTE CASE   UMBILICAL HERNIA REPAIR N/A 07/27/2020   Procedure: UMBILICAL HERNIA REPAIR WITH MESH;  Surgeon: Vanderbilt Ned, MD;  Location: [facility];  Service: General;  Laterality: N/A;   WISDOM TOOTH EXTRACTION     Patient Active Problem List   Diagnosis Date Noted   Right shoulder pain 05/18/2024   Right lateral epicondylitis 03/16/2024   Centrilobular emphysema (HCC) 03/15/2024   Encounter for general adult medical examination with abnormal findings 05/09/2023   LPRD (laryngopharyngeal reflux disease) 05/08/2023   Patellofemoral arthritis of right knee 08/19/2022   Psychophysiological insomnia 06/06/2020   Maxillary sinusitis, chronic 06/29/2017   CRI (chronic renal insufficiency), stage 3 (moderate) (HCC) 04/07/2017   Pure hyperglyceridemia 04/03/2017   Essential hypertension 11/07/2016   GAD (generalized anxiety disorder) 10/17/2015   Erectile dysfunction of organic origin 10/10/2015   PSA elevation 01/10/2015   GERD (gastroesophageal reflux disease) 04/23/2011   IBS (irritable bowel syndrome) 04/23/2011   Vitamin D  deficiency 01/16/2011   DUPUYTREN'S CONTRACTURE 01/16/2011   Hypogonadism male 07/25/2010   IDIOPATHIC OSTEOPOROSIS 11/01/2008   Vitamin B  12 deficiency 03/22/2008   Osteoarthritis 08/25/2007   Hyperlipidemia with target LDL less than 130 08/03/2007    PCP: Joshua Debby CROME, MD  REFERRING PROVIDER: Claudene Arthea HERO, DO  REFERRING DIAG: Right elbow pain  THERAPY DIAG:  Pain in right elbow  Muscle weakness (generalized)  Rationale for Evaluation and Treatment: Rehabilitation  ONSET DATE: Chronic, approximately 2 months ago   SUBJECTIVE:         SUBJECTIVE STATEMENT: Patient reports things are better. He feels he is 75% normal.   Eval: Patient reports right elbow pain. He is wearing a  wrist brace to avoid wrist extension because that hurts and lifting will also cause pain. This first started about 2 months ago when he was lifting clothes. He did have an injection and used the wrist brace in the past but it seemed to get better, but then lifting at the gym may have aggravated it again. He does always have soreness in the morning and gets sharp pains with activities that aggravate the elbow. He is using the the wrist brace constantly.   Hand dominance: Left  PERTINENT HISTORY: See PMH above  PAIN:  Are you having pain? Yes:  NPRS scale: 0/10 at rest, 4/10 pain in the morning Pain location: Right elbow Pain description: Sore, Sharp Aggravating factors: Wrist extension Relieving factors: Rest, wrist brace  PRECAUTIONS: None  PATIENT GOALS: Pain relief   OBJECTIVE:  Note: Objective measures were completed at Evaluation unless otherwise noted. PATIENT SURVEYS:  QuickDASH: 47.7% disability  POSTURE: Mild rounded shoulder and forward head posture  UPPER EXTREMITY ROM:   Shoulder, elbow, wrist AROM grossly WFL and equal bilaterally  UPPER EXTREMITY MMT:  MMT Right eval Left eval  Shoulder flexion 4+ 5  Shoulder extension 5 5  Shoulder abduction 5 5  Shoulder adduction    Shoulder internal rotation 5 5  Shoulder external rotation 4+ 5  Middle trapezius    Lower trapezius    Elbow flexion 5 5  Elbow extension 5 5  Wrist flexion  5  Wrist extension 4+ 5  Wrist ulnar deviation 5 5  Wrist radial deviation 5 5  Wrist pronation 5 5  Wrist supination 5 5  Grip strength (lbs) 58 83  (Blank rows = not tested)  Wrist strength assessment performed with elbow bent  JOINT MOBILITY TESTING:  Grossly WFL  Patient reported no change in lateral elbow symptoms with lateral glide combined with gripping  PALPATION:  Tender to palpation at wrist extensor group insertion at lateral epicondyle, wrists extensor muscle group tenderness                                                                                                                              TREATMENT [facility] Adult PT Treatment:  DATE: 09/07/2024 UBE L3 x 5 min (fwd/bwd) to improve endurance and workload capacity STM / TPR / ART for right wrist extensor muscle group Seated wrist extension with elbow straight and shoulder at 90 deg flex using 3# 3 x 10 Prone I and T 10 x 5 sec each Sidelying thoracic rotation x 10 each Thoracic extension over FR  PATIENT EDUCATION: Education details: HEP update Person educated: Patient Education method: Explanation, Demonstration, Tactile cues, Verbal cues, Handout Education comprehension: verbalized understanding, returned demonstration, verbal cues required, tactile cues required, and needs further education  HOME EXERCISE PROGRAM: Access Code: [email or web address]   ASSESSMENT: CLINICAL IMPRESSION: Patient tolerated therapy well with no adverse effects. He reports improvement in his right elbow symptoms this visit. Therapy focused on reducing muscle tension of right extensor group and progressing his load tolerance for the elbow and postural control. He continues to have some right shoulder issues that seem more related to rotator cuff and periscapular strength deficit with upper trap compensation. He also exhibits so limitations with his thoracic mobility. Updated his HEP this visit to progress periscapular and rotator cuff strengthening and postural control. Patient would benefit from continued skilled PT to progress mobility and strength in order to reduce pain and maximize functional ability.   Eval: Patient is a 73 y.o. male who was seen today for physical therapy evaluation and treatment for chronic right elbow pain that seems consistent with lateral epicondylagia. Majority of his symptoms occur when he is perform wrist movements while the elbow is extended. He demonstrates good strength with minimal pain with wrist  strength testing with his elbow bent, but increased lateral elbow pain and weakness due to pain when his elbow is extended. He does have some grip strength deficits on the right with pain and tenderness about the wrist extensor muscle mass. Overall his range of motion is good of the shoulder, elbow, and wrist but some slight strength deficit of the right shoulder.  OBJECTIVE IMPAIRMENTS: decreased activity tolerance, decreased strength, and pain.   ACTIVITY LIMITATIONS: carrying, lifting, and hygiene/grooming  PARTICIPATION LIMITATIONS: meal prep, cleaning, laundry, shopping, and community activity  PERSONAL FACTORS: Fitness, Past/current experiences, and Time since onset of injury/illness/exacerbation are also affecting patient's functional outcome.    GOALS: Goals reviewed with patient? Yes  SHORT TERM GOALS: Target date: 08/27/2024  Patient will be I with initial HEP in order to progress with therapy. Baseline: HEP provided at eval 08/30/2024: independent Goal status: MET  2.  Patient will report right elbow pain </= 2/10 with lifting while arm straight in order to reduce functional limitations Baseline: 5-6/10 pain 08/30/2024: 3-4/10 pain Goal status: ONGOING  LONG TERM GOALS: Target date: 09/24/2024  Patient will be I with final HEP to maintain progress from PT. Baseline: HEP provided at eval Goal status: INITIAL  2.  Patient will report QuickDASH </= 20% in order to indicate an improvement in their functional status Baseline: 47.7 Goal status: INITIAL  3.  Patient will demonstrate right shoulder and wrist strength 5/5 MMT in order to improve activity tolerance and reduce pain with lifting Baseline: see limitations above Goal status: INITIAL  4.  Patient will be able to lift lighter objects around home with elbow straight without pain or limitation in order to reduce functional limitations Baseline: patient reports pain and limitation lifting objects like his keys Goal status:  INITIAL   PLAN: PT FREQUENCY: 1-2x/week  PT DURATION: 8 weeks  PLANNED INTERVENTIONS: 97164- PT Re-evaluation, 97750- Physical Performance Testing, 97110-Therapeutic exercises, 97530-  Therapeutic activity, [id number]- Neuromuscular re-education, [phone]- Self Care, 02859- Manual therapy, [phone]- Ionotophoresis 4mg /ml Dexamethasone , 20560 (1-2 muscles), 20561 (3+ muscles)- Dry Needling, Patient/Family education, Taping, Joint mobilization, Joint manipulation, Cryotherapy, and Moist heat  PLAN FOR NEXT SESSION: Review HEP and progress PRN, manual/TPDN for right wrist extensor muscle group, trial elbow mobs with gripping/wrist extension, wrist/elbow/shoulder strengthening/loading with progression to straight elbow for activity tolerance, Ionto PRN   Elaine Daring, PT, DPT, LAT, ATC 09/07/24  12:59 PM Phone: [phone] Fax: [phone]   PHYSICAL THERAPY DISCHARGE SUMMARY  Visits from Start of Care: 5  Current functional level related to goals / functional outcomes: See above   Remaining deficits: See above   Education / Equipment: HEP   Patient agrees to discharge. Patient goals were partially met. Patient is being discharged due to not returning since the last visit.  Elaine Daring, PT, DPT, LAT, ATC 01/05/2025  12:39 PM Phone: [phone] Fax: [phone]   "

## 2024-09-07 NOTE — Patient Instructions (Signed)
 Access Code: [email or web address] URL: [email or web address] Date: 09/07/2024 Prepared by: Elaine Daring  Exercises - Seated Wrist Extension with Dumbbell  - 1 x daily - 3 sets - 10 reps - Forearm Pronation and Supination with Hammer  - 1 x daily - 3 sets - 10 reps - Standing Shoulder Flexion with Dumbbells  - 1 x daily - 3 sets - 5 reps - 15 seconds hold - Farmer's Carry with Kettlebells  - 1 x daily - 5 sets - 30 seconds hold - Prone Scapular Slide with Shoulder Extension  - 2 sets - 10 reps - 3 seconds hold - Prone Shoulder Horizontal Abduction with Thumbs Up  - 2 sets - 10 reps - 3 seconds hold

## 2024-09-09 ENCOUNTER — Ambulatory Visit (INDEPENDENT_AMBULATORY_CARE_PROVIDER_SITE_OTHER)

## 2024-09-09 VITALS — Ht 73.0 in | Wt 188.0 lb

## 2024-09-09 DIAGNOSIS — Z0000 Encounter for general adult medical examination without abnormal findings: Secondary | ICD-10-CM | POA: Diagnosis not present

## 2024-09-09 NOTE — Progress Notes (Signed)
 Subjective:   Jonathan Young is a 73 y.o. who presents for a Medicare Wellness preventive visit.  As a reminder, Annual Wellness Visits don't include a physical exam, and some assessments may be limited, especially if this visit is performed virtually. We may recommend an in-person follow-up visit with your provider if needed.  Visit Complete: Virtual I connected with  Jonathan Young on 09/09/24 by a audio enabled telemedicine application and verified that I am speaking with the correct person using two identifiers.  Patient Location: Home  Provider Location: Office/Clinic  I discussed the limitations of evaluation and management by telemedicine. The patient expressed understanding and agreed to proceed.  Vital Signs: Because this visit was a virtual/telehealth visit, some criteria may be missing or patient reported. Any vitals not documented were not able to be obtained and vitals that have been documented are patient reported.  VideoDeclined- This patient declined Librarian, academic. Therefore the visit was completed with audio only.  Persons Participating in Visit: Patient.  AWV Questionnaire: Yes: Patient Medicare AWV questionnaire was completed by the patient on 09/08/2024; I have confirmed that all information answered by patient is correct and no changes since this date.  Cardiac Risk Factors include: advanced age (>37men, >50 women);dyslipidemia;male gender;hypertension     Objective:    Today's Vitals   09/09/24 1235  Weight: 188 lb (85.3 kg)  Height: 6' 1 (1.854 m)   Body mass index is 24.8 kg/m.     09/09/2024   12:46 PM 07/30/2024    9:49 AM 02/27/2024    6:33 AM 02/26/2024    9:18 AM 01/12/2024   10:07 AM 10/22/2022    9:28 AM 07/27/2020    6:46 AM  Advanced Directives  Does Patient Have a Medical Advance Directive? Yes Yes Yes Yes Yes Yes Yes  Type of Estate agent of [place];Living will Healthcare Power of  [place];Living will Healthcare Power of [place];Living will  Healthcare Power of [place];Living will Healthcare Power of [place];Living will Living will  Does patient want to make changes to medical advance directive?   No - Patient declined No - Patient declined   No - Guardian declined  Copy of Healthcare Power of Attorney in Chart? No - copy requested  No - copy requested   No - copy requested     Current Medications (verified) Outpatient Encounter Medications as of 09/09/2024  Medication Sig   calcium  carbonate (OS-CAL - DOSED IN MG OF ELEMENTAL CALCIUM ) 1250 (500 Ca) MG tablet Take 1 tablet by mouth daily with breakfast.   Cholecalciferol  50 MCG (2000 UT) TABS Take 1 tablet (2,000 Units total) by mouth daily.   MAGNESIUM GLYCINATE PO Take 240 mg by mouth at bedtime.   Multiple Vitamin (MULTIVITAMIN WITH MINERALS) TABS tablet Take 1 tablet by mouth daily. Centrum Silver   pantoprazole  (PROTONIX ) 40 MG tablet TAKE 1 TABLET(40 MG) BY MOUTH DAILY   rosuvastatin  (CRESTOR ) 10 MG tablet TAKE 1 TABLET(10 MG) BY MOUTH DAILY   Suvorexant  (BELSOMRA ) 15 MG TABS Take 1 tablet by mouth at bedtime as needed.   tadalafil  (CIALIS ) 20 MG tablet Take 1 tablet (20 mg total) by mouth See admin instructions. Take 20 mg by mouth one hour prior to intercourse- maximum of 20 mg (1 tablet) in 24 hours   tiotropium (SPIRIVA ) 18 MCG inhalation capsule Place 1 capsule (18 mcg total) into inhaler and inhale daily.   valACYclovir  (VALTREX ) 1000 MG tablet Take 0.5 tablets (500 mg total)  by mouth daily.   amoxicillin  (AMOXIL ) 500 MG capsule Take 2,000 mg by mouth See admin instructions. Take 4 capsules (2000 mg) by mouth 1 hour prior to dental appointments. (Patient not taking: Reported on 09/09/2024)   No facility-administered encounter medications on file as of 09/09/2024.    Allergies (verified) Vytorin [ezetimibe-simvastatin]   History: Past Medical History:  Diagnosis Date   Complication of anesthesia     sore throat from tube   COPD (chronic obstructive pulmonary disease) (HCC)    Emphysema   Dysrhythmia    PVC'S   GERD (gastroesophageal reflux disease)    Hypercholesteremia    Hypertension    Neuromuscular disorder (HCC) 2005   had right foot-drop, no etiology found, resolved after steroids   Osteoarthritis    Umbilical hernia    Vertigo    Past Surgical History:  Procedure Laterality Date   ANTERIOR CERVICAL DECOMP/DISCECTOMY FUSION  2018   C5-6 and C6-7   done by Dr. Mora LANES LIFT AND BLEPHAROPLASTY Bilateral 2025   CARPAL TUNNEL RELEASE Left 05/23/2018   CARPAL TUNNEL RELEASE Right 11/22/2018   CERVICAL SPINE SURGERY  2019   ACDF   HEMORRHOID SURGERY  1982   HERNIA REPAIR  2021   umbilical hernia wuth mesh   INSERTION OF MESH N/A 07/27/2020   Procedure: INSERTION OF MESH;  Surgeon: Vanderbilt Ned, MD;  Location: [facility];  Service: General;  Laterality: N/A;   SHOULDER SURGERY     BIL   TONSILLECTOMY AND ADENOIDECTOMY     TOTAL HIP ARTHROPLASTY     RT TOAL HIP   TOTAL HIP ARTHROPLASTY  06/01/2012   Procedure: TOTAL HIP ARTHROPLASTY ANTERIOR APPROACH;  Surgeon: Donnice JONETTA Car, MD;  Location: [facility];  Service: Orthopedics;  Laterality: Left;   TRANSURETHRAL RESECTION OF PROSTATE N/A 02/27/2024   Procedure: TURP (TRANSURETHRAL RESECTION OF PROSTATE);  Surgeon: Nieves Donnice, MD;  Location: [facility];  Service: Urology;  Laterality: N/A;  60 MINUTE CASE   UMBILICAL HERNIA REPAIR N/A 07/27/2020   Procedure: UMBILICAL HERNIA REPAIR WITH MESH;  Surgeon: Vanderbilt Ned, MD;  Location: [facility];  Service: General;  Laterality: N/A;   WISDOM TOOTH EXTRACTION     Family History  Problem Relation Age of Onset   Emphysema Mother    Colon cancer Neg Hx    Esophageal cancer Neg Hx    Rectal cancer Neg Hx    Stomach cancer Neg Hx    Social History   Socioeconomic History   Marital status: Single    Spouse name: Not on file   Number  of children: Not on file   Years of education: Not on file   Highest education level: Bachelor's degree (e.g., BA, AB, BS)  Occupational History   Not on file  Tobacco Use   Smoking status: Former    Current packs/day: 0.00    Types: Cigarettes    Quit date: 05/27/1981    Years since quitting: 43.3    Passive exposure: Never   Smokeless tobacco: Never  Vaping Use   Vaping status: Never Used  Substance and Sexual Activity   Alcohol use: Yes    Alcohol/week: 20.0 standard drinks of alcohol    Types: 20 Shots of liquor per week    Comment: wine nightly   Drug use: No   Sexual activity: Yes    Partners: Male  Other Topics Concern   Not on file  Social History Narrative   Not  on file   Social Drivers of Health   Financial Resource Strain: Low Risk  (09/09/2024)   Overall Financial Resource Strain (CARDIA)    Difficulty of Paying Living Expenses: Not hard at all  Food Insecurity: No Food Insecurity (09/09/2024)   Hunger Vital Sign    Worried About Running Out of Food in the Last Year: Never true    Ran Out of Food in the Last Year: Never true  Transportation Needs: No Transportation Needs (09/09/2024)   PRAPARE - Administrator, Civil Service (Medical): No    Lack of Transportation (Non-Medical): No  Physical Activity: Sufficiently Active (09/09/2024)   Exercise Vital Sign    Days of Exercise per Week: 5 days    Minutes of Exercise per Session: 30 min  Stress: Stress Concern Present (09/09/2024)   [employer] of Occupational Health - Occupational Stress Questionnaire    Feeling of Stress: To some extent  Social Connections: Moderately Isolated (09/09/2024)   Social Connection and Isolation Panel    Frequency of Communication with Friends and Family: More than three times a week    Frequency of Social Gatherings with Friends and Family: More than three times a week    Attends Religious Services: Never    Database administrator or Organizations: No    Attends  Engineer, structural: Never    Marital Status: Living with partner    Tobacco Counseling Counseling given: Not Answered    Clinical Intake:  Pre-visit preparation completed: Yes  Pain : No/denies pain     BMI - recorded: 24.8 Nutritional Status: BMI of 19-24  Normal Nutritional Risks: None Diabetes: No  Lab Results  Component Value Date   HGBA1C 5.8 04/03/2016   HGBA1C 5.2 07/29/2012     How often do you need to have someone help you when you read instructions, pamphlets, or other written materials from your doctor or pharmacy?: 1 - Never  Interpreter Needed?: No  Information entered by :: Verdie Saba, [name]   Activities of Daily Living     09/09/2024   12:38 PM 09/08/2024    2:59 PM  In your present state of health, do you have any difficulty performing the following activities:  Hearing? 0 0  Vision? 0 0  Difficulty concentrating or making decisions? 0 0  Walking or climbing stairs? 0 0  Dressing or bathing? 0 0  Doing errands, shopping? 0 0  Preparing Food and eating ? N N  Using the Toilet? N N  In the past six months, have you accidently leaked urine? N N  Do you have problems with loss of bowel control? N N  Managing your Medications? N N  Managing your Finances? N N  Housekeeping or managing your Housekeeping? N N    Patient Care Team: Joshua Debby CROME, MD as PCP - General (Internal Medicine) Joshua Rush, OD as Consulting Physician (Optometry)  I have updated your Care Teams any recent Medical Services you may have received from other providers in the past year.     Assessment:   This is a routine wellness examination for [place].  Hearing/Vision screen Hearing Screening - Comments:: Denies hearing difficulties   Vision Screening - Comments:: Wears rx glasses - up to date with routine eye exams with  [facility]   Goals Addressed               This Visit's Progress     Patient Stated (pt-stated)  Patient stated he plans  to continue exercising and watching diet       Depression Screen     09/09/2024   12:48 PM 12/25/2023    2:37 PM 11/11/2023    8:21 AM 05/08/2023    2:32 PM 10/22/2022    9:23 AM 04/18/2021    8:09 AM 03/02/2020   11:01 AM  PHQ 2/9 Scores  PHQ - 2 Score 0 0 0 1 0 1 0  PHQ- 9 Score 0 3 3        Fall Risk     09/09/2024   12:37 PM 09/08/2024    2:59 PM 12/25/2023    2:36 PM 11/11/2023    8:18 AM 05/08/2023    2:32 PM  Fall Risk   Falls in the past year? 0 0 0 0 0  Number falls in past yr: 0 0 0 0 0  Injury with Fall? 0 0 0 0 0  Risk for fall due to : No Fall Risks  No Fall Risks  No Fall Risks  Follow up Falls evaluation completed;Falls prevention discussed  Falls evaluation completed Falls evaluation completed;Education provided;Falls prevention discussed Falls evaluation completed    MEDICARE RISK AT HOME:  Medicare Risk at Home Any stairs in or around the home?: Yes If so, are there any without handrails?: No Home free of loose throw rugs in walkways, pet beds, electrical cords, etc?: Yes Adequate lighting in your home to reduce risk of falls?: Yes Life alert?: No Use of a cane, walker or w/c?: No Grab bars in the bathroom?: No Shower chair or bench in shower?: Yes Elevated toilet seat or a handicapped toilet?: Yes  TIMED UP AND GO:  Was the test performed?  No  Cognitive Function: 6CIT completed        09/09/2024   12:47 PM 11/11/2023    8:19 AM 10/22/2022    9:30 AM  6CIT Screen  What Year? 0 points 0 points 0 points  What month? 0 points 0 points 0 points  What time? 0 points 0 points 0 points  Count back from 20 0 points 0 points 0 points  Months in reverse 0 points 0 points 0 points  Repeat phrase 0 points 0 points 0 points  Total Score 0 points 0 points 0 points    Immunizations Immunization History  Administered Date(s) Administered   Fluad Quad(high Dose 65+) 08/25/2019, 09/18/2022   INFLUENZA, HIGH DOSE SEASONAL PF 08/26/2018   Influenza Split  09/17/2012, 08/24/2013   Influenza Whole 10/23/2000, 09/28/2009, 09/20/2010   Influenza,inj,Quad PF,6+ Mos 08/24/2014, 08/18/2015   Influenza-Unspecified 10/01/2016, 11/11/2017, 11/28/2023   PFIZER Comirnaty(Gray Top)Covid-19 Tri-Sucrose Vaccine 09/18/2022   PFIZER(Purple Top)SARS-COV-2 Vaccination 01/12/2020, 01/31/2020, 09/14/2020, 04/01/2021   Pfizer Covid-19 Vaccine Bivalent Booster 71yrs & up 04/22/2022, 11/28/2023   Pneumococcal Conjugate-13 10/01/2016   Pneumococcal Polysaccharide-23 11/11/2017, 01/01/2023   Td 12/23/2005   Tdap 03/17/2016, 04/15/2023   Vaccinia,smallpox Monkeypox Vaccine Live,pf 08/21/2021, 10/19/2021   Zoster Recombinant(Shingrix) 07/16/2017, 11/22/2017   Zoster, Live 01/16/2011    Screening Tests Health Maintenance  Topic Date Due   Influenza Vaccine  07/23/2024   COVID-19 Vaccine (8 - 2025-26 season) 08/23/2024   Medicare Annual Wellness (AWV)  09/09/2025   Colonoscopy  01/31/2028   DTaP/Tdap/Td (4 - Td or Tdap) 04/14/2033   Pneumococcal Vaccine: 50+ Years  Completed   Hepatitis C Screening  Completed   Zoster Vaccines- Shingrix  Completed   HPV VACCINES  Aged Out   Meningococcal B Vaccine  Aged Out    Health Maintenance Items Addressed: 09/09/2024  Additional Screening:  Vision Screening: Recommended annual ophthalmology exams for early detection of glaucoma and other disorders of the eye. Is the patient up to date with their annual eye exam?  Yes  Who is the provider or what is the name of the office in which the patient attends annual eye exams? [facility]  Dental Screening: Recommended annual dental exams for proper oral hygiene  Community Resource Referral / Chronic Care Management: CRR required this visit?  No   CCM required this visit?  No   Plan:    I have personally reviewed and noted the following in the patient's chart:   Medical and social history Use of alcohol, tobacco or illicit drugs  Current medications and  supplements including opioid prescriptions. Patient is not currently taking opioid prescriptions. Functional ability and status Nutritional status Physical activity Advanced directives List of other physicians Hospitalizations, surgeries, and ER visits in previous 12 months Vitals Screenings to include cognitive, depression, and falls Referrals and appointments  In addition, I have reviewed and discussed with patient certain preventive protocols, quality metrics, and best practice recommendations. A written personalized care plan for preventive services as well as general preventive health recommendations were provided to patient.   Verdie CHRISTELLA Saba, [name]   09/09/2024   After Visit Summary: (MyChart) Due to this being a telephonic visit, the after visit summary with patients personalized plan was offered to patient via MyChart   Notes: Nothing significant to report at this time.

## 2024-09-09 NOTE — Patient Instructions (Addendum)
 [name],  Thank you for taking the time for your Medicare Wellness Visit. I appreciate your continued commitment to your health goals. Please review the care plan we discussed, and feel free to reach out if I can assist you further.  Medicare recommends these wellness visits once per year to help you and your care team stay ahead of potential health issues. These visits are designed to focus on prevention, allowing your provider to concentrate on managing your acute and chronic conditions during your regular appointments.  Please note that Annual Wellness Visits do not include a physical exam. Some assessments may be limited, especially if the visit was conducted virtually. If needed, we may recommend a separate in-person follow-up with your provider.  Ongoing Care Seeing your primary care provider every 3 to 6 months helps us  monitor your health and provide consistent, personalized care.   Referrals If a referral was made during today's visit and you haven't received any updates within two weeks, please contact the referred provider directly to check on the status.  Recommended Screenings:  Health Maintenance  Topic Date Due   Flu Shot  07/23/2024   COVID-19 Vaccine (8 - 2025-26 season) 08/23/2024   Medicare Annual Wellness Visit  09/09/2025   Colon Cancer Screening  01/31/2028   DTaP/Tdap/Td vaccine (4 - Td or Tdap) 04/14/2033   Pneumococcal Vaccine for age over 66  Completed   Hepatitis C Screening  Completed   Zoster (Shingles) Vaccine  Completed   HPV Vaccine  Aged Out   Meningitis B Vaccine  Aged Out       09/09/2024   12:46 PM  Advanced Directives  Does Patient Have a Medical Advance Directive? Yes  Type of Estate agent of [place];Living will  Copy of Healthcare Power of Attorney in Chart? No - copy requested   Advance Care Planning is important because it: Ensures you receive medical care that aligns with your values, goals, and  preferences. Provides guidance to your family and loved ones, reducing the emotional burden of decision-making during critical moments.  Vision: Annual vision screenings are recommended for early detection of glaucoma, cataracts, and diabetic retinopathy. These exams can also reveal signs of chronic conditions such as diabetes and high blood pressure.  Dental: Annual dental screenings help detect early signs of oral cancer, gum disease, and other conditions linked to overall health, including heart disease and diabetes.

## 2024-09-11 ENCOUNTER — Telehealth: Admitting: Nurse Practitioner

## 2024-09-11 DIAGNOSIS — U071 COVID-19: Secondary | ICD-10-CM | POA: Diagnosis not present

## 2024-09-11 MED ORDER — NIRMATRELVIR/RITONAVIR (PAXLOVID)TABLET: 3.0000 | ORAL_TABLET | Freq: Two times a day (BID) | ORAL | 0 refills | Status: AC

## 2024-09-11 NOTE — Patient Instructions (Signed)
 Jonathan Young, thank you for joining Haze LELON Servant, NP for today's virtual visit.  While this provider is not your primary care provider (PCP), if your PCP is located in our provider database this encounter information will be shared with them immediately following your visit.   A [facility] MyChart account gives you access to today's visit and all your visits, tests, and labs performed at [facility]  click here if you don't have a [facility] MyChart account or go to [email or web address]  Consent: (Patient) Jonathan Young provided verbal consent for this virtual visit at the beginning of the encounter.  Current Medications:  Current Outpatient Medications:    nirmatrelvir /ritonavir  (PAXLOVID ) 20 x 150 MG & 10 x 100MG  TABS, Take 3 tablets by mouth 2 (two) times daily for 5 days. (Take nirmatrelvir  150 mg two tablets twice daily for 5 days and ritonavir  100 mg one tablet twice daily for 5 days) Patient GFR is >60, Disp: 30 tablet, Rfl: 0   amoxicillin  (AMOXIL ) 500 MG capsule, Take 2,000 mg by mouth See admin instructions. Take 4 capsules (2000 mg) by mouth 1 hour prior to dental appointments. (Patient not taking: Reported on 09/09/2024), Disp: , Rfl:    calcium  carbonate (OS-CAL - DOSED IN MG OF ELEMENTAL CALCIUM ) 1250 (500 Ca) MG tablet, Take 1 tablet by mouth daily with breakfast., Disp: , Rfl:    Cholecalciferol  50 MCG (2000 UT) TABS, Take 1 tablet (2,000 Units total) by mouth daily., Disp: 90 tablet, Rfl: 1   MAGNESIUM GLYCINATE PO, Take 240 mg by mouth at bedtime., Disp: , Rfl:    Multiple Vitamin (MULTIVITAMIN WITH MINERALS) TABS tablet, Take 1 tablet by mouth daily. Centrum Silver, Disp: , Rfl:    pantoprazole  (PROTONIX ) 40 MG tablet, TAKE 1 TABLET(40 MG) BY MOUTH DAILY, Disp: 30 tablet, Rfl: 0   rosuvastatin  (CRESTOR ) 10 MG tablet, TAKE 1 TABLET(10 MG) BY MOUTH DAILY, Disp: 90 tablet, Rfl: 1   Suvorexant  (BELSOMRA ) 15 MG TABS, Take 1 tablet by mouth at bedtime as  needed., Disp: 90 tablet, Rfl: 0   tadalafil  (CIALIS ) 20 MG tablet, Take 1 tablet (20 mg total) by mouth See admin instructions. Take 20 mg by mouth one hour prior to intercourse- maximum of 20 mg (1 tablet) in 24 hours, Disp: 20 tablet, Rfl: 2   tiotropium (SPIRIVA ) 18 MCG inhalation capsule, Place 1 capsule (18 mcg total) into inhaler and inhale daily., Disp: 30 capsule, Rfl: 3   valACYclovir  (VALTREX ) 1000 MG tablet, Take 0.5 tablets (500 mg total) by mouth daily., Disp: 30 tablet, Rfl: 0   Medications ordered in this encounter:  Meds ordered this encounter  Medications   nirmatrelvir /ritonavir  (PAXLOVID ) 20 x 150 MG & 10 x 100MG  TABS    Sig: Take 3 tablets by mouth 2 (two) times daily for 5 days. (Take nirmatrelvir  150 mg two tablets twice daily for 5 days and ritonavir  100 mg one tablet twice daily for 5 days) Patient GFR is >60    Dispense:  30 tablet    Refill:  0    Supervising Provider:   BLAISE ALEENE KIDD [phone]     *If you need refills on other medications prior to your next appointment, please contact your pharmacy*  Follow-Up: Call back or seek an in-person evaluation if the symptoms worsen or if the condition fails to improve as anticipated.   Virtual Care [phone]  Other Instructions  Please keep well-hydrated and get plenty of rest. Start a saline  nasal rinse to flush out your nasal passages. You can use plain Mucinex to help thin congestion. If you have a humidifier, you can use this daily as needed.    You are to wear a mask for 5 days from onset of your symptoms.  After day 5, if you have had no fever and you are feeling better with NO symptoms, you can end masking. Keep in mind you can be contagious 10 days from the onset of symptoms  After day 5 if you have a fever or are having significant symptoms, please wear your mask for full 10 days.   If you note any worsening of symptoms, any significant shortness of breath or any chest pain, please  seek ER evaluation ASAP.  Please do not delay care!    If you note any worsening of symptoms, any significant shortness of breath or any chest pain, please seek ER evaluation ASAP.  Please do not delay care!    If you have been instructed to have an in-person evaluation today at a local Urgent Care facility, please use the link below. It will take you to a list of all of our available [facility], including address, phone number and hours of operation. Please do not delay care.  [facility]  If you or a family member do not have a primary care provider, use the link below to schedule a visit and establish care. When you choose a [facility] primary care physician or advanced practice provider, you gain a long-term partner in health. Find a Primary Care Provider  Learn more about [facility] in-office and virtual care options: [facility] - Get Care Now

## 2024-09-11 NOTE — Progress Notes (Signed)
 Virtual Visit Consent   Jonathan Young, you are scheduled for a virtual visit with a [facility] provider today. Just as with appointments in the office, your consent must be obtained to participate. Your consent will be active for this visit and any virtual visit you may have with one of our providers in the next 365 days. If you have a MyChart account, a copy of this consent can be sent to you electronically.  As this is a virtual visit, video technology does not allow for your provider to perform a traditional examination. This may limit your provider's ability to fully assess your condition. If your provider identifies any concerns that need to be evaluated in person or the need to arrange testing (such as labs, EKG, etc.), we will make arrangements to do so. Although advances in technology are sophisticated, we cannot ensure that it will always work on either your end or our end. If the connection with a video visit is poor, the visit may have to be switched to a telephone visit. With either a video or telephone visit, we are not always able to ensure that we have a secure connection.  By engaging in this virtual visit, you consent to the provision of healthcare and authorize for your insurance to be billed (if applicable) for the services provided during this visit. Depending on your insurance coverage, you may receive a charge related to this service.  I need to obtain your verbal consent now. Are you willing to proceed with your visit today? Jonathan Young has provided verbal consent on 09/11/2024 for a virtual visit (video or telephone). Jonathan LELON Servant, NP  Date: 09/11/2024 9:27 AM   Virtual Visit via Video Note   I, Jonathan Young, connected with  Jonathan Young  ([id number], [birth year]) on 09/11/24 at  9:15 AM EDT by a video-enabled telemedicine application and verified that I am speaking with the correct person using two identifiers.  Location: Patient: Virtual Visit Location Patient:  Home Provider: Virtual Visit Location Provider: Home Office   I discussed the limitations of evaluation and management by telemedicine and the availability of in person appointments. The patient expressed understanding and agreed to proceed.    History of Present Illness: Jonathan Young is a 73 y.o. who identifies as a male who was assigned male at birth, and is being seen today for COVID POSITIVE  Jonathan Young started experiencing fatigue and scratchy throat over the past 24 hours. Yesterday he tested positive for COVID. Additional symptoms currently include nasal congestion and dry cough. He is taking mucinex and using steroid nasal spray. Interested in paxlovid  today. No fever, chest pain or shortness of breath    Problems:  Patient Active Problem List   Diagnosis Date Noted   Right shoulder pain 05/18/2024   Right lateral epicondylitis 03/16/2024   Centrilobular emphysema (HCC) 03/15/2024   Encounter for general adult medical examination with abnormal findings 05/09/2023   LPRD (laryngopharyngeal reflux disease) 05/08/2023   Patellofemoral arthritis of right knee 08/19/2022   Psychophysiological insomnia 06/06/2020   Maxillary sinusitis, chronic 06/29/2017   CRI (chronic renal insufficiency), stage 3 (moderate) (HCC) 04/07/2017   Pure hyperglyceridemia 04/03/2017   Essential hypertension 11/07/2016   GAD (generalized anxiety disorder) 10/17/2015   Erectile dysfunction of organic origin 10/10/2015   PSA elevation 01/10/2015   GERD (gastroesophageal reflux disease) 04/23/2011   IBS (irritable bowel syndrome) 04/23/2011   Vitamin D  deficiency 01/16/2011   DUPUYTREN'S CONTRACTURE 01/16/2011   Hypogonadism  male 07/25/2010   IDIOPATHIC OSTEOPOROSIS 11/01/2008   Vitamin B 12 deficiency 03/22/2008   Osteoarthritis 08/25/2007   Hyperlipidemia with target LDL less than 130 08/03/2007    Allergies:  Allergies  Allergen Reactions   Vytorin [Ezetimibe-Simvastatin] Other (See Comments)     Muscle aches   Medications:  Current Outpatient Medications:    nirmatrelvir /ritonavir  (PAXLOVID ) 20 x 150 MG & 10 x 100MG  TABS, Take 3 tablets by mouth 2 (two) times daily for 5 days. (Take nirmatrelvir  150 mg two tablets twice daily for 5 days and ritonavir  100 mg one tablet twice daily for 5 days) Patient GFR is >60, Disp: 30 tablet, Rfl: 0   amoxicillin  (AMOXIL ) 500 MG capsule, Take 2,000 mg by mouth See admin instructions. Take 4 capsules (2000 mg) by mouth 1 hour prior to dental appointments. (Patient not taking: Reported on 09/09/2024), Disp: , Rfl:    calcium  carbonate (OS-CAL - DOSED IN MG OF ELEMENTAL CALCIUM ) 1250 (500 Ca) MG tablet, Take 1 tablet by mouth daily with breakfast., Disp: , Rfl:    Cholecalciferol  50 MCG (2000 UT) TABS, Take 1 tablet (2,000 Units total) by mouth daily., Disp: 90 tablet, Rfl: 1   MAGNESIUM GLYCINATE PO, Take 240 mg by mouth at bedtime., Disp: , Rfl:    Multiple Vitamin (MULTIVITAMIN WITH MINERALS) TABS tablet, Take 1 tablet by mouth daily. Centrum Silver, Disp: , Rfl:    pantoprazole  (PROTONIX ) 40 MG tablet, TAKE 1 TABLET(40 MG) BY MOUTH DAILY, Disp: 30 tablet, Rfl: 0   rosuvastatin  (CRESTOR ) 10 MG tablet, TAKE 1 TABLET(10 MG) BY MOUTH DAILY, Disp: 90 tablet, Rfl: 1   Suvorexant  (BELSOMRA ) 15 MG TABS, Take 1 tablet by mouth at bedtime as needed., Disp: 90 tablet, Rfl: 0   tadalafil  (CIALIS ) 20 MG tablet, Take 1 tablet (20 mg total) by mouth See admin instructions. Take 20 mg by mouth one hour prior to intercourse- maximum of 20 mg (1 tablet) in 24 hours, Disp: 20 tablet, Rfl: 2   tiotropium (SPIRIVA ) 18 MCG inhalation capsule, Place 1 capsule (18 mcg total) into inhaler and inhale daily., Disp: 30 capsule, Rfl: 3   valACYclovir  (VALTREX ) 1000 MG tablet, Take 0.5 tablets (500 mg total) by mouth daily., Disp: 30 tablet, Rfl: 0  Observations/Objective: Patient is well-developed, well-nourished in no acute distress.  Resting comfortably at home.  Head is  normocephalic, atraumatic.  No labored breathing.  Speech is clear and coherent with logical content.  Patient is alert and oriented at baseline.   Assessment and Plan: 1. Positive self-administered antigen test for COVID-19 (Primary) - nirmatrelvir /ritonavir  (PAXLOVID ) 20 x 150 MG & 10 x 100MG  TABS; Take 3 tablets by mouth 2 (two) times daily for 5 days. (Take nirmatrelvir  150 mg two tablets twice daily for 5 days and ritonavir  100 mg one tablet twice daily for 5 days) Patient GFR is >60  Dispense: 30 tablet; Refill: 0   Please keep well-hydrated and get plenty of rest. Start a saline nasal rinse to flush out your nasal passages. You can use plain Mucinex to help thin congestion. If you have a humidifier, you can use this daily as needed.    You are to wear a mask for 5 days from onset of your symptoms.  After day 5, if you have had no fever and you are feeling better with NO symptoms, you can end masking. Keep in mind you can be contagious 10 days from the onset of symptoms  After day 5 if you have a  fever or are having significant symptoms, please wear your mask for full 10 days.   If you note any worsening of symptoms, any significant shortness of breath or any chest pain, please seek ER evaluation ASAP.  Please do not delay care!    If you note any worsening of symptoms, any significant shortness of breath or any chest pain, please seek ER evaluation ASAP.  Please do not delay care!   Follow Up Instructions: I discussed the assessment and treatment plan with the patient. The patient was provided an opportunity to ask questions and all were answered. The patient agreed with the plan and demonstrated an understanding of the instructions.  A copy of instructions were sent to the patient via MyChart unless otherwise noted below.     The patient was advised to call back or seek an in-person evaluation if the symptoms worsen or if the condition fails to improve as anticipated.    Danaysia Rader  W Carnelia Oscar, NP

## 2024-09-15 ENCOUNTER — Encounter: Payer: Self-pay | Admitting: Internal Medicine

## 2024-09-17 ENCOUNTER — Other Ambulatory Visit: Payer: Self-pay | Admitting: Internal Medicine

## 2024-09-17 DIAGNOSIS — K219 Gastro-esophageal reflux disease without esophagitis: Secondary | ICD-10-CM

## 2024-09-17 NOTE — Telephone Encounter (Signed)
 He needs to be seen for the palpitations

## 2024-09-21 ENCOUNTER — Other Ambulatory Visit (HOSPITAL_COMMUNITY): Payer: Self-pay

## 2024-09-21 ENCOUNTER — Encounter: Payer: Self-pay | Admitting: Internal Medicine

## 2024-09-21 ENCOUNTER — Encounter: Admitting: Physical Therapy

## 2024-09-21 ENCOUNTER — Ambulatory Visit: Admitting: Internal Medicine

## 2024-09-21 ENCOUNTER — Ambulatory Visit: Attending: Internal Medicine

## 2024-09-21 VITALS — BP 124/80 | HR 83 | Temp 97.9°F | Resp 16 | Ht 73.0 in | Wt 186.0 lb

## 2024-09-21 DIAGNOSIS — Z114 Encounter for screening for human immunodeficiency virus [HIV]: Secondary | ICD-10-CM | POA: Insufficient documentation

## 2024-09-21 DIAGNOSIS — C44722 Squamous cell carcinoma of skin of right lower limb, including hip: Secondary | ICD-10-CM | POA: Insufficient documentation

## 2024-09-21 DIAGNOSIS — J432 Centrilobular emphysema: Secondary | ICD-10-CM

## 2024-09-21 DIAGNOSIS — E559 Vitamin D deficiency, unspecified: Secondary | ICD-10-CM

## 2024-09-21 DIAGNOSIS — L821 Other seborrheic keratosis: Secondary | ICD-10-CM | POA: Insufficient documentation

## 2024-09-21 DIAGNOSIS — R002 Palpitations: Secondary | ICD-10-CM

## 2024-09-21 DIAGNOSIS — Z0001 Encounter for general adult medical examination with abnormal findings: Secondary | ICD-10-CM | POA: Diagnosis not present

## 2024-09-21 DIAGNOSIS — E785 Hyperlipidemia, unspecified: Secondary | ICD-10-CM

## 2024-09-21 DIAGNOSIS — Z23 Encounter for immunization: Secondary | ICD-10-CM | POA: Diagnosis not present

## 2024-09-21 DIAGNOSIS — M818 Other osteoporosis without current pathological fracture: Secondary | ICD-10-CM

## 2024-09-21 DIAGNOSIS — K219 Gastro-esophageal reflux disease without esophagitis: Secondary | ICD-10-CM

## 2024-09-21 DIAGNOSIS — N183 Chronic kidney disease, stage 3 unspecified: Secondary | ICD-10-CM

## 2024-09-21 DIAGNOSIS — N2889 Other specified disorders of kidney and ureter: Secondary | ICD-10-CM

## 2024-09-21 LAB — PHOSPHORUS: Phosphorus: 4.2 mg/dL (ref 2.3–4.6)

## 2024-09-21 LAB — LIPID PANEL
Cholesterol: 202 mg/dL — ABNORMAL HIGH (ref 0–200)
HDL: 45 mg/dL (ref >39.00)
LDL Cholesterol: 82 mg/dL (ref 0–99)
NonHDL: 157.36
Total CHOL/HDL Ratio: 4
Triglycerides: 379 mg/dL — ABNORMAL HIGH (ref 0.0–149.0)
VLDL: 75.8 mg/dL — ABNORMAL HIGH (ref 0.0–40.0)

## 2024-09-21 LAB — CBC WITH DIFFERENTIAL/PLATELET
Basophils Absolute: 0 K/uL (ref 0.0–0.1)
Basophils Relative: 0.4 % (ref 0.0–3.0)
Eosinophils Absolute: 0.1 K/uL (ref 0.0–0.7)
Eosinophils Relative: 1.8 % (ref 0.0–5.0)
HCT: 43.8 % (ref 39.0–52.0)
Hemoglobin: 14.9 g/dL (ref 13.0–17.0)
Lymphocytes Relative: 31.2 % (ref 12.0–46.0)
Lymphs Abs: 2.6 K/uL (ref 0.7–4.0)
MCHC: 34 g/dL (ref 30.0–36.0)
MCV: 94.1 fl (ref 78.0–100.0)
Monocytes Absolute: 0.6 K/uL (ref 0.1–1.0)
Monocytes Relative: 7.2 % (ref 3.0–12.0)
Neutro Abs: 5 K/uL (ref 1.4–7.7)
Neutrophils Relative %: 59.4 % (ref 43.0–77.0)
Platelets: 255 K/uL (ref 150.0–400.0)
RBC: 4.66 Mil/uL (ref 4.22–5.81)
RDW: 12.8 % (ref 11.5–15.5)
WBC: 8.4 K/uL (ref 4.0–10.5)

## 2024-09-21 LAB — HEPATIC FUNCTION PANEL
ALT: 22 U/L (ref 0–53)
AST: 26 U/L (ref 0–37)
Albumin: 4.6 g/dL (ref 3.5–5.2)
Alkaline Phosphatase: 52 U/L (ref 39–117)
Bilirubin, Direct: 0.1 mg/dL (ref 0.0–0.3)
Total Bilirubin: 0.4 mg/dL (ref 0.2–1.2)
Total Protein: 6.8 g/dL (ref 6.0–8.3)

## 2024-09-21 LAB — BASIC METABOLIC PANEL WITH GFR
BUN: 25 mg/dL — ABNORMAL HIGH (ref 6–23)
CO2: 32 meq/L (ref 19–32)
Calcium: 9.9 mg/dL (ref 8.4–10.5)
Chloride: 99 meq/L (ref 96–112)
Creatinine, Ser: 1.5 mg/dL (ref 0.40–1.50)
GFR: 45.83 mL/min — ABNORMAL LOW (ref >60.00)
Glucose, Bld: 89 mg/dL (ref 70–99)
Potassium: 4.7 meq/L (ref 3.5–5.1)
Sodium: 137 meq/L (ref 135–145)

## 2024-09-21 LAB — TSH: TSH: 1.28 u[IU]/mL (ref 0.35–5.50)

## 2024-09-21 LAB — CK: Total CK: 80 U/L (ref 17–232)

## 2024-09-21 LAB — VITAMIN D 25 HYDROXY (VIT D DEFICIENCY, FRACTURES): VITD: 48.59 ng/mL (ref 30.00–100.00)

## 2024-09-21 MED ORDER — EMPAGLIFLOZIN 10 MG PO TABS
10.0000 mg | ORAL_TABLET | Freq: Every day | ORAL | 1 refills | Status: AC
  Filled 2024-09-21: qty 90, 90d supply, fill #0
  Filled 2024-12-17: qty 90, 90d supply, fill #1

## 2024-09-21 MED ORDER — PANTOPRAZOLE SODIUM 40 MG PO TBEC
40.0000 mg | DELAYED_RELEASE_TABLET | Freq: Every day | ORAL | 1 refills | Status: DC
Start: 2024-09-21 — End: 2024-09-22

## 2024-09-21 NOTE — Patient Instructions (Signed)
Palpitations Palpitations are feelings that your heartbeat is irregular or is faster than normal. It may feel like your heart is fluttering or skipping a beat. Palpitations may be caused by many things, including smoking, caffeine, alcohol, stress, and certain medicines or drugs. Most causes of palpitations are not serious.  However, some palpitations can be a sign of a serious problem. Further tests and a thorough medical history will be done to find the cause of your palpitations. Your provider may order tests such as an ECG, labs, an echocardiogram, or an ambulatory continuous ECG monitor. Follow these instructions at home: Pay attention to any changes in your symptoms. Let your health care provider know about them. Take these actions to help manage your symptoms: Eating and drinking Follow instructions from your health care provider about eating or drinking restrictions. You may need to avoid foods and drinks that may cause palpitations. These may include: Caffeinated coffee, tea, soft drinks, and energy drinks. Chocolate. Alcohol. Diet pills. Lifestyle     Take steps to reduce your stress and anxiety. Things that can help you relax include: Yoga. Mind-body activities, such as deep breathing, meditation, or using words and images to create positive thoughts (guided imagery). Physical activity, such as swimming, jogging, or walking. Tell your health care provider if your palpitations increase with activity. If you have chest pain or shortness of breath with activity, do not continue the activity until you are seen by your health care provider. Biofeedback. This is a method that helps you learn to use your mind to control things in your body, such as your heartbeat. Get plenty of rest and sleep. Keep a regular bed time. Do not use drugs, including cocaine or ecstasy. Do not use marijuana. Do not use any products that contain nicotine or tobacco. These products include cigarettes, chewing  tobacco, and vaping devices, such as e-cigarettes. If you need help quitting, ask your health care provider. General instructions Take over-the-counter and prescription medicines only as told by your health care provider. Keep all follow-up visits. This is important. These may include visits for further testing if palpitations do not go away or get worse. Contact a health care provider if: You continue to have a fast or irregular heartbeat for a long period of time. You notice that your palpitations occur more often. Get help right away if: You have chest pain or shortness of breath. You have a severe headache. You feel dizzy or you faint. These symptoms may represent a serious problem that is an emergency. Do not wait to see if the symptoms will go away. Get medical help right away. Call your local emergency services (911 in the [place] Do not drive yourself to the hospital. Summary Palpitations are feelings that your heartbeat is irregular or is faster than normal. It may feel like your heart is fluttering or skipping a beat. Palpitations may be caused by many things, including smoking, caffeine, alcohol, stress, certain medicines, and drugs. Further tests and a thorough medical history may be done to find the cause of your palpitations. Get help right away if you faint or have chest pain, shortness of breath, severe headache, or dizziness. This information is not intended to replace advice given to you by your health care provider. Make sure you discuss any questions you have with your health care provider. Document Revised: 05/02/2021 Document Reviewed: 05/02/2021 Elsevier Patient Education  2024 [employer].

## 2024-09-21 NOTE — Progress Notes (Unsigned)
 EP to read.

## 2024-09-21 NOTE — Progress Notes (Signed)
 Subjective:  Patient ID: Jonathan Young, male    DOB: 05-Jul-1951  Age: 73 y.o. MRN: [id number]  CC: Follow-up (On his supplements ), Palpitations (Patient thinks that this was coming from his paxlovid  that he was taking. ), and Annual Exam (Requesting labs )   HPI Jonathan Young presents for a CPX and f/up  ---  Discussed the use of AI scribe software for clinical note transcription with the patient, who gave verbal consent to proceed.  History of Present Illness Jonathan Young is a 73 year old male who presents with palpitations following a recent COVID-19 infection.  He experienced palpitations described as 'fluttering and thumping' following a COVID-19 infection. He reports having COVID-19 a little over a week ago and took Paxlovid  for three days before discontinuing. The palpitations have mostly resolved since stopping the medication. He denies dizziness, lightheadedness, chest pain, shortness of breath, or feeling like he was on the verge of passing out during the palpitations. He has a history of palpitations years ago but has not experienced them recently until this episode. He reports that he has not worn a heart monitor in the past.  After taking Paxlovid , his COVID-19 symptoms, which included feeling very unwell, improved significantly by the evening of the first day of treatment. He slept for ten hours the following day and did not experience any further symptoms such as coughing, sneezing, or nasal congestion. He is not currently taking any medications to control symptoms and reports no recent exposure to COVID-19.  He is currently taking creatine and bisglycinate magnesium supplements from the company [employer], and occasionally consumes a protein drink. He is concerned about the impact of these supplements on his kidney function, as he is aware of potential issues with high protein intake and kidney health. He is monitoring his diet to avoid high potassium foods, such as bananas, due to  previous blood work indicating high potassium levels.  He has a history of emphysema but reports it does not significantly affect him. He was prescribed an inhaler by a specialist, but he found it too expensive and has not used it. He was contacted by the specialist's office three weeks ago to follow up.  He recently saw his urologist and reports that everything is okay. He has a history of a high PSA level, which has been around nine, but it decreased slightly after a procedure. He has undergone biopsies and MRIs, which have not revealed any significant findings. He had a TURP procedure in the past.     Outpatient Medications Prior to Visit  Medication Sig Dispense Refill   amoxicillin  (AMOXIL ) 500 MG capsule Take 2,000 mg by mouth See admin instructions. Take 4 capsules (2000 mg) by mouth 1 hour prior to dental appointments.     calcium  carbonate (OS-CAL - DOSED IN MG OF ELEMENTAL CALCIUM ) 1250 (500 Ca) MG tablet Take 1 tablet by mouth daily with breakfast.     Cholecalciferol  50 MCG (2000 UT) TABS Take 1 tablet (2,000 Units total) by mouth daily. 90 tablet 1   MAGNESIUM GLYCINATE PO Take 240 mg by mouth at bedtime.     Multiple Vitamin (MULTIVITAMIN WITH MINERALS) TABS tablet Take 1 tablet by mouth daily. Centrum Silver     tadalafil  (CIALIS ) 20 MG tablet Take 1 tablet (20 mg total) by mouth See admin instructions. Take 20 mg by mouth one hour prior to intercourse- maximum of 20 mg (1 tablet) in 24 hours 20 tablet 2   valACYclovir  (VALTREX )  1000 MG tablet Take 0.5 tablets (500 mg total) by mouth daily. 30 tablet 0   pantoprazole  (PROTONIX ) 40 MG tablet TAKE 1 TABLET(40 MG) BY MOUTH DAILY 30 tablet 0   rosuvastatin  (CRESTOR ) 10 MG tablet TAKE 1 TABLET(10 MG) BY MOUTH DAILY 90 tablet 1   tiotropium (SPIRIVA ) 18 MCG inhalation capsule Place 1 capsule (18 mcg total) into inhaler and inhale daily. 30 capsule 3   Suvorexant  (BELSOMRA ) 15 MG TABS Take 1 tablet by mouth at bedtime as needed. (Patient  not taking: Reported on 09/21/2024) 90 tablet 0   No facility-administered medications prior to visit.    ROS Review of Systems  Constitutional:  Negative for appetite change, chills, diaphoresis, fatigue and fever.  HENT: Negative.  Negative for sore throat and trouble swallowing.   Eyes: Negative.   Respiratory:  Negative for cough, chest tightness, shortness of breath and wheezing.   Cardiovascular:  Positive for palpitations. Negative for chest pain and leg swelling.  Gastrointestinal:  Negative for abdominal pain, blood in stool, constipation, diarrhea, nausea and vomiting.  Endocrine: Negative.   Genitourinary: Negative.  Negative for difficulty urinating and dysuria.  Musculoskeletal:  Positive for arthralgias. Negative for joint swelling and myalgias.  Skin: Negative.  Negative for color change and rash.  Neurological:  Negative for dizziness, weakness and light-headedness.  Hematological:  Negative for adenopathy. Does not bruise/bleed easily.  Psychiatric/Behavioral: Negative.      Objective:  BP 124/80 (BP Location: Left Arm, Patient Position: Sitting, Cuff Size: Normal)   Pulse 83   Temp 97.9 F (36.6 C) (Oral)   Resp 16   Ht 6' 1 (1.854 m)   Wt 186 lb (84.4 kg)   SpO2 97%   BMI 24.54 kg/m   BP Readings from Last 3 Encounters:  09/21/24 124/80  09/02/24 102/60  07/19/24 114/76    Wt Readings from Last 3 Encounters:  09/21/24 186 lb (84.4 kg)  09/09/24 188 lb (85.3 kg)  09/02/24 188 lb (85.3 kg)    Physical Exam Vitals reviewed.  Constitutional:      Appearance: Normal appearance.  HENT:     Mouth/Throat:     Mouth: Mucous membranes are moist.  Eyes:     General: No scleral icterus.    Conjunctiva/sclera: Conjunctivae normal.  Cardiovascular:     Rate and Rhythm: Normal rate and regular rhythm.     Heart sounds: No murmur heard.    No friction rub. No gallop.     Comments: EKG--- NSR, 72 bpm No LVH, Q waves, or ST/T wave changes   Unchanged  Pulmonary:     Effort: Pulmonary effort is normal.     Breath sounds: No stridor. No wheezing, rhonchi or rales.  Abdominal:     General: Abdomen is flat.     Palpations: There is no mass.     Tenderness: There is no abdominal tenderness. There is no guarding.     Hernia: No hernia is present.  Musculoskeletal:        General: Normal range of motion.     Cervical back: Neck supple.     Right lower leg: No edema.     Left lower leg: No edema.  Lymphadenopathy:     Cervical: No cervical adenopathy.  Skin:    General: Skin is warm and dry.     Findings: No rash.  Neurological:     General: No focal deficit present.     Mental Status: He is alert. Mental status is at  baseline.  Psychiatric:        Mood and Affect: Mood normal.        Behavior: Behavior normal.     Lab Results  Component Value Date   WBC 8.4 09/21/2024   HGB 14.9 09/21/2024   HCT 43.8 09/21/2024   PLT 255.0 09/21/2024   GLUCOSE 89 09/21/2024   CHOL 202 (H) 09/21/2024   TRIG 379.0 (H) 09/21/2024   HDL 45.00 09/21/2024   LDLDIRECT 87.0 04/03/2016   LDLCALC 82 09/21/2024   ALT 22 09/21/2024   AST 26 09/21/2024   NA 137 09/21/2024   K 4.7 09/21/2024   CL 99 09/21/2024   CREATININE 1.50 09/21/2024   BUN 25 (H) 09/21/2024   CO2 32 09/21/2024   TSH 1.28 09/21/2024   PSA 8.1 11/06/2023   INR 1.0 04/18/2021   HGBA1C 5.8 04/03/2016    No results found.  Assessment & Plan:   Centrilobular emphysema (HCC)- He is asx. -     CBC with Differential/Platelet; Future  Intermittent palpitations -     LONG TERM MONITOR (3-14 DAYS); Future -     TSH; Future -     Basic metabolic panel with GFR; Future  IDIOPATHIC OSTEOPOROSIS -     Phosphorus; Future -     VITAMIN D  25 Hydroxy (Vit-D Deficiency, Fractures); Future -     Basic metabolic panel with GFR; Future  Hyperlipidemia with target LDL less than 130- LDL goal achieved. Doing well on the statin  -     Lipid panel; Future -     TSH;  Future -     Hepatic function panel; Future -     CK; Future  Need for immunization against influenza -     Flu vaccine HIGH DOSE PF(Fluzone Trivalent)  Palpitations -     EKG 12-Lead  Encounter for screening for HIV -     HIV Antibody (routine testing w rflx); Future  Gastroesophageal reflux disease without esophagitis -     Pantoprazole  Sodium; Take 1 tablet (20 mg total) by mouth daily.  Dispense: 90 tablet; Refill: 1  LPRD (laryngopharyngeal reflux disease) -     Pantoprazole  Sodium; Take 1 tablet (20 mg total) by mouth daily.  Dispense: 90 tablet; Refill: 1  CRI (chronic renal insufficiency), stage 3 (moderate)- Will decrease the PPI dose and start an SGLT-2 inh. -     Empagliflozin; Take 1 tablet (10 mg total) by mouth daily before breakfast.  Dispense: 90 tablet; Refill: 1 -     US  RENAL; Future     Follow-up: Return in about 6 months (around 03/21/2025).  Debby Molt, MD

## 2024-09-22 ENCOUNTER — Other Ambulatory Visit (HOSPITAL_COMMUNITY): Payer: Self-pay

## 2024-09-22 ENCOUNTER — Ambulatory Visit: Payer: Self-pay | Admitting: Internal Medicine

## 2024-09-22 ENCOUNTER — Encounter: Payer: Self-pay | Admitting: Internal Medicine

## 2024-09-22 LAB — HIV ANTIBODY (ROUTINE TESTING W REFLEX)
HIV 1&2 Ab, 4th Generation: NONREACTIVE
HIV FINAL INTERPRETATION: NEGATIVE

## 2024-09-22 MED ORDER — PANTOPRAZOLE SODIUM 20 MG PO TBEC
20.0000 mg | DELAYED_RELEASE_TABLET | Freq: Every day | ORAL | 1 refills | Status: DC
  Filled 2024-09-22: qty 90, 90d supply, fill #0

## 2024-09-22 MED ORDER — ROSUVASTATIN CALCIUM 10 MG PO TABS
10.0000 mg | ORAL_TABLET | Freq: Every day | ORAL | 1 refills | Status: AC
  Filled 2024-09-22: qty 90, 90d supply, fill #0
  Filled 2024-12-17: qty 90, 90d supply, fill #1

## 2024-09-22 NOTE — Assessment & Plan Note (Signed)
Exam completed, labs reviewed, vaccines reviewed and updated, cancer screenings are UTD, pt ed material was given.

## 2024-09-23 ENCOUNTER — Ambulatory Visit
Admission: RE | Admit: 2024-09-23 | Discharge: 2024-09-23 | Disposition: A | Discharge status: Home / Self Care | Source: Ambulatory Visit | Attending: Internal Medicine | Admitting: Internal Medicine

## 2024-09-23 DIAGNOSIS — N189 Chronic kidney disease, unspecified: Secondary | ICD-10-CM | POA: Diagnosis not present

## 2024-09-23 DIAGNOSIS — N2889 Other specified disorders of kidney and ureter: Secondary | ICD-10-CM

## 2024-09-26 ENCOUNTER — Other Ambulatory Visit: Payer: Self-pay | Admitting: Internal Medicine

## 2024-09-26 DIAGNOSIS — N2889 Other specified disorders of kidney and ureter: Secondary | ICD-10-CM

## 2024-10-07 ENCOUNTER — Encounter: Payer: Self-pay | Admitting: Internal Medicine

## 2024-10-15 DIAGNOSIS — R002 Palpitations: Secondary | ICD-10-CM | POA: Diagnosis not present

## 2024-10-18 ENCOUNTER — Other Ambulatory Visit: Payer: Self-pay | Admitting: Internal Medicine

## 2024-10-18 DIAGNOSIS — I471 Supraventricular tachycardia, unspecified: Secondary | ICD-10-CM | POA: Insufficient documentation

## 2024-10-18 DIAGNOSIS — R002 Palpitations: Secondary | ICD-10-CM

## 2024-10-28 ENCOUNTER — Telehealth: Payer: Self-pay

## 2024-10-28 NOTE — Telephone Encounter (Signed)
 Copied from CRM [id number]. Topic: General - Call Back - No Documentation >> Oct 28, 2024  9:43 AM Terri G wrote: Reason for CRM: Patient is calling regarding heart monitor. Would like if a nurse would call him and talk about it. Callback number [phone]

## 2024-10-29 NOTE — Telephone Encounter (Signed)
 Unable to reach patient. LMTRC

## 2024-10-29 NOTE — Telephone Encounter (Signed)
 Handling this via my chart. Closing the telephone encounter

## 2024-11-29 ENCOUNTER — Encounter: Payer: Self-pay | Admitting: Cardiovascular Disease

## 2024-11-29 ENCOUNTER — Ambulatory Visit: Attending: Cardiology | Admitting: Cardiovascular Disease

## 2024-11-29 VITALS — BP 100/62 | HR 67 | Ht 74.0 in | Wt 185.2 lb

## 2024-11-29 DIAGNOSIS — I471 Supraventricular tachycardia, unspecified: Secondary | ICD-10-CM

## 2024-11-29 NOTE — Progress Notes (Unsigned)
 Cardiology Office Note   Date:  11/29/2024  ID:  Jonathan Young, DOB 01/06/1951, MRN [id number] PCP: Joshua Debby CROME, MD  [facility] Providers Cardiologist:  None     History of Present Illness Jonathan Young is a 73 y.o. male who is being seen today for the evaluation of palpitations at the request of Joshua Debby CROME, MD.  Jonathan Young is a 73 year old male who presents with palpitations and chest discomfort following a recent COVID-19 infection. He was referred by his general doctor for evaluation of heart palpitations.  He experienced heart palpitations and irregular heartbeats after taking Paxlovid  for COVID-19. The palpitations began after the first day of medication and persisted for three days, leading him to discontinue the treatment. He has a history of palpitations for years, often feeling like his heart stops and then pumps, especially when lying down for a nap. Recently, he has experienced episodes where he feels the need to take deep breaths, and on one occasion, he felt chest discomfort while driving.  He wore a heart monitor as recommended by Dr. Joshua. Palpitations occur on and off, lasting about 30 seconds to a minute, and are sometimes accompanied by a sensation of breathlessness. He has not experienced these symptoms during physical activity or weightlifting.  He has a history of high cholesterol and is currently on Crestor , which has been effective. He also has a history of smoking, having quit 43 years ago after smoking two to three packs a day for 16 years. He maintains a physically active lifestyle and regularly lifts weights. His calcium  score is in the 14th percentile for age/gender.  Recent fasting glucose was 89.  He does not have diabetes mellitus.  He has a history of elevated potassium levels, which he manages by avoiding high-potassium foods. His kidney function has been a concern, with a GFR just under 50, but  CKD severity may be overestimated due to  his muscle mass and previous creatine use.  Coronary calcium  performed in May 2024 showed a very low value of 3.2 (14th percentile).  Arrhythmia monitor performed in September - October 2025 showed 8 brief episodes of nonsustained ectopic atrial tachycardia, for maximum 11 beats and rare PVCs, a pattern of bigeminy.  There were no symptom triggered recordings so it is hard to say which of these might have caused palpitations.   Studies Reviewed EKG Interpretation Date/Time:  Monday November 29 2024 10:47:03 EST Ventricular Rate:  67 PR Interval:  158 QRS Duration:  88 QT Interval:  368 QTC Calculation: 388 R Axis:   76  Text Interpretation: Normal sinus rhythm Normal ECG Confirmed by Anaid Haney ([id number]) on 11/29/2024 11:03:44 AM    Lipid Panel     Component Value Date/Time   CHOL 202 (H) 09/21/2024 1528   TRIG 379.0 (H) 09/21/2024 1528   TRIG 244 (HH) 01/01/2007 0818   HDL 45.00 09/21/2024 1528   CHOLHDL 4 09/21/2024 1528   VLDL 75.8 (H) 09/21/2024 1528   LDLCALC 82 09/21/2024 1528   LDLDIRECT 87.0 04/03/2016 0851   12/18/2023 cholesterol 168, HDL 49, LDL 102, triglycerides 88  Risk Assessment/Calculations           Physical Exam VS:  BP 100/62 (BP Location: Left Arm, Patient Position: Sitting, Cuff Size: Normal)   Pulse 67   Ht 6' 2 (1.88 m)   Wt 185 lb 3.2 oz (84 kg)   SpO2 98%   BMI 23.78 kg/m  Wt Readings from Last 3 Encounters:  11/29/24 185 lb 3.2 oz (84 kg)  09/21/24 186 lb (84.4 kg)  09/09/24 188 lb (85.3 kg)    GEN: Well nourished, well developed in no acute distress NECK: No JVD; No carotid bruits CARDIAC: RRR, no murmurs, rubs, gallops RESPIRATORY:  Clear to auscultation without rales, wheezing or rhonchi  ABDOMEN: Soft, non-tender, non-distended EXTREMITIES:  No edema; No deformity   ASSESSMENT AND PLAN Atrial tachycardia (nonsustained) and premature atrial contractions (PACs):  Brief bursts of nonsustained atrial tachycardia and PACs  noted on monitoring. Symptoms include palpitations and occasional dyspnea. No atrial fibrillation detected. Symptoms are likely exacerbated by recent respiratory infection. No immediate concern for atrial fibrillation.  Consider using Apple Watch for irregular rhythm alerts, send EKG recordings if suspicious for of atrial fibrillation. Premature ventricular contractions (PVCs):  PVCs noted on monitoring, likely exacerbated by recent illness or stress. No significant symptoms reported. No structural heart abnormalities. HLP: Moderately elevated triglycerides, but this was not a fasting sample.  A year ago the fasting triglycerides were completely normal at 82.  The cholesterol parameters are appropriate on the current dose of rosuvastatin .  I do not think adding more medications is the right thing to do, would rather focus on a diet low in simple carbohydrates and starches with high glycemic index, low in saturated fat, enriched with unsaturated fats from fish/nuts/oils and lean protein, carbohydrates from whole grains with high glycemic index. Chronic kidney disease, stage 3 (suspected):  based on creatinine levels. Potassium levels are normal. Previous creatine use may have affected creatinine levels. No recent re-evaluation of kidney function since discontinuation of creatine. Re-evaluate kidney function after discontinuation of creatine.  He has been started on empagliflozin  by PCP.  He does not have heart failure or diabetes mellitus.        Dispo: Please send ECG from smart watch recorded during symptomatic events, via MyChart.  Continue healthy diet and exercise.  Recommend follow-up with cardiology as needed.   Signed, Jerel Balding, MD

## 2024-11-29 NOTE — Patient Instructions (Signed)
 Medication Instructions:  No changes *If you need a refill on your cardiac medications before your next appointment, please call your pharmacy*  Lab Work: None ordered If you have labs (blood work) drawn today and your tests are completely normal, you will receive your results only by: MyChart Message (if you have MyChart) OR A paper copy in the mail If you have any lab test that is abnormal or we need to change your treatment, we will call you to review the results.  Testing/Procedures: None ordered  Follow-Up: At [facility], you and your health needs are our priority.  As part of our continuing mission to provide you with exceptional heart care, our providers are all part of one team.  This team includes your primary Cardiologist (physician) and Advanced Practice Providers or APPs (Physician Assistants and Nurse Practitioners) who all work together to provide you with the care you need, when you need it.  Your next appointment:    Follow up as needed  Provider:   Dr Francyne  We recommend signing up for the patient portal called MyChart.  Sign up information is provided on this After Visit Summary.  MyChart is used to connect with patients for Virtual Visits (Telemedicine).  Patients are able to view lab/test results, encounter notes, upcoming appointments, etc.  Non-urgent messages can be sent to your provider as well.   To learn more about what you can do with MyChart, go to [email or web address].

## 2024-12-01 NOTE — Progress Notes (Signed)
 [facility] [address] [place] [address] Phone: [phone] Subjective:   Jonathan Young, am serving as a scribe for Dr. Arthea Claudene.  I'm seeing this patient by the request  of:  Joshua Debby CROME, MD Right shoulder pain, right elbow pain CC:   YEP:Dlagzrupcz  09/02/2024 Chronic problem, has had 2 surgeries on the shoulder previously.  Some degenerative changes but no weakness.  Given injection today and hopeful that this will make significant improvement.  Warned of potential side effects.  The patient is going to take it easy for the first 48 hours and did recently move that probably did cause an exacerbation.  Follow-up with me again in 3 months      Improvement noted, discussed icing regimen and home exercises, discussed which activities to do and which ones to avoid.  Increase activity slowly.  Discussed icing regimen.  Still avoid overhead lifting.  Will follow-up with me again in 3 months      Update 12/02/2024 Jonathan Young is a 73 y.o. male coming in with complaint of R elbow and R shoulder pain. Patient states that his elbow still bothers him intermittently. Has pain at night especially.       Past Medical History:  Diagnosis Date   Complication of anesthesia    sore throat from tube   COPD (chronic obstructive pulmonary disease) (HCC)    Emphysema   Dysrhythmia    PVC'S   GERD (gastroesophageal reflux disease)    Hypercholesteremia    Hypertension    Neuromuscular disorder (HCC) 2005   had right foot-drop, no etiology found, resolved after steroids   Osteoarthritis    Umbilical hernia    Vertigo    Past Surgical History:  Procedure Laterality Date   ANTERIOR CERVICAL DECOMP/DISCECTOMY FUSION  2018   C5-6 and C6-7   done by Dr. Mora LANES LIFT AND BLEPHAROPLASTY Bilateral 2025   CARPAL TUNNEL RELEASE Left 05/23/2018   CARPAL TUNNEL RELEASE Right 11/22/2018   CERVICAL SPINE SURGERY  2019   ACDF   HEMORRHOID  SURGERY  1982   HERNIA REPAIR  2021   umbilical hernia wuth mesh   INSERTION OF MESH N/A 07/27/2020   Procedure: INSERTION OF MESH;  Surgeon: Vanderbilt Debby, MD;  Location: [facility];  Service: General;  Laterality: N/A;   SHOULDER SURGERY     BIL   TONSILLECTOMY AND ADENOIDECTOMY     TOTAL HIP ARTHROPLASTY     RT TOAL HIP   TOTAL HIP ARTHROPLASTY  06/01/2012   Procedure: TOTAL HIP ARTHROPLASTY ANTERIOR APPROACH;  Surgeon: Donnice JONETTA Car, MD;  Location: [facility];  Service: Orthopedics;  Laterality: Left;   TRANSURETHRAL RESECTION OF PROSTATE N/A 02/27/2024   Procedure: TURP (TRANSURETHRAL RESECTION OF PROSTATE);  Surgeon: Nieves Donnice, MD;  Location: [facility];  Service: Urology;  Laterality: N/A;  60 MINUTE CASE   UMBILICAL HERNIA REPAIR N/A 07/27/2020   Procedure: UMBILICAL HERNIA REPAIR WITH MESH;  Surgeon: Vanderbilt Debby, MD;  Location: [facility];  Service: General;  Laterality: N/A;   WISDOM TOOTH EXTRACTION     Social History   Socioeconomic History   Marital status: Single    Spouse name: Not on file   Number of children: Not on file   Years of education: Not on file   Highest education level: Bachelor's degree (e.g., BA, AB, BS)  Occupational History   Not on file  Tobacco Use  Smoking status: Former    Current packs/day: 0.00    Types: Cigarettes    Quit date: 05/27/1981    Years since quitting: 43.5    Passive exposure: Never   Smokeless tobacco: Never  Vaping Use   Vaping status: Never Used  Substance and Sexual Activity   Alcohol use: Yes    Alcohol/week: 20.0 standard drinks of alcohol    Types: 20 Shots of liquor per week    Comment: wine nightly   Drug use: No   Sexual activity: Yes    Partners: Male  Other Topics Concern   Not on file  Social History Narrative   Not on file   Social Drivers of Health   Tobacco Use: Medium Risk (11/29/2024)   Patient History    Smoking Tobacco Use: Former    Smokeless Tobacco Use:  Never    Passive Exposure: Never  Physicist, Medical Strain: Low Risk (09/09/2024)   Overall Financial Resource Strain (CARDIA)    Difficulty of Paying Living Expenses: Not hard at all  Food Insecurity: No Food Insecurity (09/09/2024)   Epic    Worried About Programme Researcher, Broadcasting/film/video in the Last Year: Never true    Ran Out of Food in the Last Year: Never true  Transportation Needs: No Transportation Needs (09/09/2024)   Epic    Lack of Transportation (Medical): No    Lack of Transportation (Non-Medical): No  Physical Activity: Sufficiently Active (09/09/2024)   Exercise Vital Sign    Days of Exercise per Week: 5 days    Minutes of Exercise per Session: 30 min  Stress: Stress Concern Present (09/09/2024)   [employer] of Occupational Health - Occupational Stress Questionnaire    Feeling of Stress: To some extent  Social Connections: Moderately Isolated (09/09/2024)   Social Connection and Isolation Panel    Frequency of Communication with Friends and Family: More than three times a week    Frequency of Social Gatherings with Friends and Family: More than three times a week    Attends Religious Services: Never    Database Administrator or Organizations: No    Attends Banker Meetings: Never    Marital Status: Living with partner  Depression (PHQ2-9): Low Risk (09/09/2024)   Depression (PHQ2-9)    PHQ-2 Score: 0  Alcohol Screen: Low Risk (09/09/2024)   Alcohol Screen    Last Alcohol Screening Score (AUDIT): 4  Recent Concern: Alcohol Screen - Medium Risk (09/08/2024)   Alcohol Screen    Last Alcohol Screening Score (AUDIT): 9  Housing: Low Risk (09/09/2024)   Epic    Unable to Pay for Housing in the Last Year: No    Number of Times Moved in the Last Year: 1    Homeless in the Last Year: No  Utilities: Not At Risk (09/09/2024)   Epic    Threatened with loss of utilities: No  Health Literacy: Adequate Health Literacy (09/09/2024)   B1300 Health Literacy    Frequency of need  for help with medical instructions: Never   Allergies  Allergen Reactions   Vytorin [Ezetimibe-Simvastatin] Other (See Comments)    Muscle aches   Family History  Problem Relation Age of Onset   Emphysema Mother    Colon cancer Neg Hx    Esophageal cancer Neg Hx    Rectal cancer Neg Hx    Stomach cancer Neg Hx     Current Outpatient Medications (Endocrine & Metabolic):    empagliflozin  (JARDIANCE ) 10 MG TABS  tablet, Take 1 tablet (10 mg total) by mouth daily before breakfast.  Current Outpatient Medications (Cardiovascular):    rosuvastatin  (CRESTOR ) 10 MG tablet, Take 1 tablet (10 mg total) by mouth daily.   tadalafil  (CIALIS ) 20 MG tablet, Take 1 tablet (20 mg total) by mouth See admin instructions. Take 20 mg by mouth one hour prior to intercourse- maximum of 20 mg (1 tablet) in 24 hours  Current Outpatient Medications (Other):    amoxicillin  (AMOXIL ) 500 MG capsule, Take 2,000 mg by mouth See admin instructions. Take 4 capsules (2000 mg) by mouth 1 hour prior to dental appointments.   calcium  carbonate (OS-CAL - DOSED IN MG OF ELEMENTAL CALCIUM ) 1250 (500 Ca) MG tablet, Take 1 tablet by mouth daily with breakfast.   Cholecalciferol  50 MCG (2000 UT) TABS, Take 1 tablet (2,000 Units total) by mouth daily.   MAGNESIUM GLYCINATE PO, Take 240 mg by mouth at bedtime.   Multiple Vitamin (MULTIVITAMIN WITH MINERALS) TABS tablet, Take 1 tablet by mouth daily. Centrum Silver   pantoprazole  (PROTONIX ) 20 MG tablet, Take 1 tablet (20 mg total) by mouth daily.   pantoprazole  (PROTONIX ) 40 MG tablet, Take 40 mg by mouth daily.   valACYclovir  (VALTREX ) 1000 MG tablet, Take 0.5 tablets (500 mg total) by mouth daily.    Objective  Blood pressure 102/68, pulse 72, height 6' 2 (1.88 m), weight 187 lb (84.8 kg), SpO2 97%.   General: No apparent distress alert and oriented x3 mood and affect normal, dressed appropriately.  HEENT: Pupils equal, extraocular movements intact  Respiratory:  Patient's speak in full sentences and does not appear short of breath  Cardiovascular: No lower extremity edema, non tender, no erythema  Shoulder completely normal with patient moving it while discussing things today. Patient does have tenderness to palpation though with palpation a little bit over the lateral epicondyle area.  Some pain with resisted extension of the wrist against resistance.  Good grip strength no noted.   Limited muscular skeletal ultrasound was performed and interpreted by CLAUDENE HUSSAR, M  Limited ultrasound of patient's elbow shows no significant Mild hypoechoic changes and mild increase in Doppler flow but no true tearing appreciated of the common extensor tendon. Impression and Recommendations:     The above documentation has been reviewed and is accurate and complete Abigale Dorow M Westlee Devita, DO

## 2024-12-02 ENCOUNTER — Other Ambulatory Visit: Payer: Self-pay

## 2024-12-02 ENCOUNTER — Ambulatory Visit: Admitting: Family Medicine

## 2024-12-02 ENCOUNTER — Encounter: Payer: Self-pay | Admitting: Family Medicine

## 2024-12-02 VITALS — BP 102/68 | HR 72 | Ht 74.0 in | Wt 187.0 lb

## 2024-12-02 DIAGNOSIS — M25521 Pain in right elbow: Secondary | ICD-10-CM | POA: Diagnosis not present

## 2024-12-02 DIAGNOSIS — M7711 Lateral epicondylitis, right elbow: Secondary | ICD-10-CM | POA: Diagnosis not present

## 2024-12-02 NOTE — Patient Instructions (Signed)
 Avoid repetitive extension  DHEA 50mg  for 4 weeks then 2 weeks off See me in 3 months

## 2024-12-02 NOTE — Assessment & Plan Note (Signed)
 Lateral epicondylitis.  No tearing appreciated but still some hypoechoic changes.  Patient still has some increasing in Doppler flow patient has not made great progress.  I do think that we can continue to monitor.  Increase activity slowly.  Follow-up again in 6 to 12 weeks.

## 2024-12-17 ENCOUNTER — Other Ambulatory Visit (HOSPITAL_COMMUNITY): Payer: Self-pay

## 2025-03-02 ENCOUNTER — Ambulatory Visit: Admitting: Family Medicine

## 2025-09-13 ENCOUNTER — Ambulatory Visit
